# Patient Record
Sex: Female | Born: 1975 | Race: White | Hispanic: No | Marital: Married | Smoking: Former smoker
Health system: Southern US, Community
[De-identification: ages and names within clinical notes are randomized; demographics above are authoritative.]

## PROBLEM LIST (undated history)

## (undated) DIAGNOSIS — F419 Anxiety disorder, unspecified: Secondary | ICD-10-CM

## (undated) DIAGNOSIS — J45909 Unspecified asthma, uncomplicated: Secondary | ICD-10-CM

## (undated) DIAGNOSIS — M719 Bursopathy, unspecified: Secondary | ICD-10-CM

## (undated) DIAGNOSIS — G43909 Migraine, unspecified, not intractable, without status migrainosus: Secondary | ICD-10-CM

## (undated) DIAGNOSIS — M1990 Unspecified osteoarthritis, unspecified site: Secondary | ICD-10-CM

## (undated) DIAGNOSIS — F32A Depression, unspecified: Secondary | ICD-10-CM

## (undated) DIAGNOSIS — I499 Cardiac arrhythmia, unspecified: Secondary | ICD-10-CM

## (undated) DIAGNOSIS — D649 Anemia, unspecified: Secondary | ICD-10-CM

## (undated) DIAGNOSIS — A048 Other specified bacterial intestinal infections: Secondary | ICD-10-CM

## (undated) DIAGNOSIS — F329 Major depressive disorder, single episode, unspecified: Secondary | ICD-10-CM

## (undated) HISTORY — DX: Other specified bacterial intestinal infections: A04.8

## (undated) HISTORY — DX: Major depressive disorder, single episode, unspecified: F32.9

## (undated) HISTORY — DX: Anxiety disorder, unspecified: F41.9

## (undated) HISTORY — DX: Depression, unspecified: F32.A

## (undated) HISTORY — PX: AUGMENTATION MAMMAPLASTY: SUR837

## (undated) HISTORY — PX: OTHER SURGICAL HISTORY: SHX169

## (undated) HISTORY — DX: Bursopathy, unspecified: M71.9

## (undated) HISTORY — DX: Unspecified osteoarthritis, unspecified site: M19.90

## (undated) HISTORY — DX: Unspecified asthma, uncomplicated: J45.909

## (undated) HISTORY — DX: Migraine, unspecified, not intractable, without status migrainosus: G43.909

---

## 2006-03-29 HISTORY — PX: COSMETIC SURGERY: SHX468

## 2017-04-04 ENCOUNTER — Ambulatory Visit (INDEPENDENT_AMBULATORY_CARE_PROVIDER_SITE_OTHER): Admitting: Physician Assistant

## 2017-04-04 ENCOUNTER — Encounter: Payer: Self-pay | Admitting: Physician Assistant

## 2017-04-04 VITALS — BP 119/80 | HR 84 | Temp 98.3°F | Ht 65.0 in | Wt 151.6 lb

## 2017-04-04 DIAGNOSIS — M7071 Other bursitis of hip, right hip: Secondary | ICD-10-CM | POA: Diagnosis not present

## 2017-04-04 DIAGNOSIS — F339 Major depressive disorder, recurrent, unspecified: Secondary | ICD-10-CM

## 2017-04-04 DIAGNOSIS — G43809 Other migraine, not intractable, without status migrainosus: Secondary | ICD-10-CM

## 2017-04-04 DIAGNOSIS — Z0000 Encounter for general adult medical examination without abnormal findings: Secondary | ICD-10-CM

## 2017-04-04 DIAGNOSIS — F411 Generalized anxiety disorder: Secondary | ICD-10-CM | POA: Diagnosis not present

## 2017-04-04 DIAGNOSIS — N938 Other specified abnormal uterine and vaginal bleeding: Secondary | ICD-10-CM

## 2017-04-04 DIAGNOSIS — G43909 Migraine, unspecified, not intractable, without status migrainosus: Secondary | ICD-10-CM | POA: Insufficient documentation

## 2017-04-04 DIAGNOSIS — F418 Other specified anxiety disorders: Secondary | ICD-10-CM | POA: Insufficient documentation

## 2017-04-04 MED ORDER — BUPROPION HCL ER (XL) 150 MG PO TB24: 150.0000 mg | ORAL_TABLET | Freq: Every day | ORAL | 5 refills | Status: DC

## 2017-04-04 MED ORDER — HYDROCODONE-ACETAMINOPHEN 5-325 MG PO TABS: 1.0000 | ORAL_TABLET | Freq: Four times a day (QID) | ORAL | 0 refills | Status: DC | PRN

## 2017-04-04 MED ORDER — ESCITALOPRAM OXALATE 10 MG PO TABS: 10.0000 mg | ORAL_TABLET | Freq: Every day | ORAL | 5 refills | Status: DC

## 2017-04-04 MED ORDER — OXCARBAZEPINE 300 MG PO TABS: 300.0000 mg | ORAL_TABLET | Freq: Every day | ORAL | 11 refills | Status: DC

## 2017-04-04 MED ORDER — ESCITALOPRAM OXALATE 20 MG PO TABS: 10.0000 mg | ORAL_TABLET | Freq: Every day | ORAL | 5 refills | Status: DC

## 2017-04-04 MED ORDER — MELOXICAM 7.5 MG PO TABS: 7.5000 mg | ORAL_TABLET | Freq: Every day | ORAL | 11 refills | Status: AC

## 2017-04-04 MED ORDER — ALPRAZOLAM 1 MG PO TABS: 1.0000 mg | ORAL_TABLET | Freq: Three times a day (TID) | ORAL | 1 refills | Status: DC

## 2017-04-04 NOTE — Patient Instructions (Addendum)
Bursitis Bursitis is when the fluid-filled sac (bursa) that covers and protects a joint is swollen (inflamed). Bursitis is most common near joints, especially the knees, elbows, hips, and shoulders. Follow these instructions at home:  Take medicines only as told by your doctor.  If you were prescribed an antibiotic medicine, finish it all even if you start to feel better.  Rest the affected area as told by your doctor. ? Keep the area raised up. ? Avoid doing things that make the pain worse.  Apply ice to the injured area: ? Place ice in a plastic bag. ? Place a towel between your skin and the bag. ? Leave the ice on for 20 minutes, 2-3 times a day.  Use splints, braces, pads, or walking aids as told by your doctor.  Keep all follow-up visits as told by your doctor. This is important. Contact a doctor if:  You have more pain with home care.  You have a fever.  You have chills. This information is not intended to replace advice given to you by your health care provider. Make sure you discuss any questions you have with your health care provider. Document Released: 09/02/2009 Document Revised: 08/21/2015 Document Reviewed: 06/04/2013 Elsevier Interactive Patient Education  2018 [employer].   Your provider wants you to schedule an appointment with a Psychologist/Psychiatrist. The following list of offices requires the patient to call and make their own appointment, as there is information they need that only you can provide. Please feel free to choose form the following providers:  [facility]   [phone] Crisis Recovery in [place] [phone]  [facility]  [phone] [place], [place]  (Scheduled through [facility]) Must call and do an interview for appointment. Sees Children / Accepts Medicaid  [facility]    [phone]  [address]    [place], [place]       [facility]  [phone] [address] [place], [place]  Evaluates for Autism but does not treat it Sees Children / Accepts Medicaid  [facility]    [phone] [address]   [place], [place] Medication management, substance abuse, bipolar, grief, family, marriage, OCD, anxiety, PTSD Sees children / Accepts Medicaid  [facility]    [phone] [address] [place], [place] Sees children / Accepts [facility]  [facility]  [phone] [address] [place], [place]   Dr Estelle Grumbles     [phone] [address] [place], [place]  Sees ADD & ADHD for treatment Accepts Medicaid  [facility]  [phone] [address], [place] Evaluates for Autism Accepts [facility]  [facility]  [phone] [address] [place], [place]  Does Adult ADD evaluations Does not accept Medicaid  [facility]   [phone] [address] [place], [place] Uses animal therapy  Sees children as young as 64 years old Accepts [facility]     [phone]    [address]  [place], [place] [address] Sees children Accepts Medicaid

## 2017-04-04 NOTE — Progress Notes (Signed)
BP 119/80   Pulse 84   Temp 98.3 F (36.8 C) (Oral)   Ht 5' 5"  (1.651 m)   Wt 151 lb 9.6 oz (68.8 kg)   LMP 04/04/2017   BMI 25.23 kg/m    Subjective:    Patient ID: Kimberly Terrell, female    DOB: 06-02-1975, 42 y.o.   MRN: [id number]  HPI: Kimberly Terrell is a 42 y.o. female presenting on 04/04/2017 for New Patient (Initial Visit); Establish Care; and Sore Throat Patient comes in as a new patient today.  She has moved to the area from [place]. She has currently migraines, bursitis of her right hip, generalized anxiety, dysfunctional uterine bleeding, depression.  All of her medications are reviewed today.  She is due her annual labs today.  Will refill meds as needed and we will plan for gynecology evaluation.  Relevant past medical, surgical, family and social history reviewed and updated as indicated. Allergies and medications reviewed and updated.  Past Medical History:  Diagnosis Date  . Anxiety   . Arthritis   . Asthma   . Depression   . Migraines     Past Surgical History:  Procedure Laterality Date  . COSMETIC SURGERY  2008   breast implants     Review of Systems  Constitutional: Negative.  Negative for activity change, fatigue and fever.  HENT: Negative.   Eyes: Negative.   Respiratory: Negative.  Negative for cough.   Cardiovascular: Negative.  Negative for chest pain.  Gastrointestinal: Positive for abdominal pain.  Endocrine: Negative.   Genitourinary: Positive for menstrual problem, pelvic pain and vaginal pain. Negative for dysuria, frequency and hematuria.  Musculoskeletal: Positive for arthralgias and gait problem.  Skin: Negative.   Psychiatric/Behavioral: Positive for dysphoric mood, self-injury and sleep disturbance. Negative for suicidal ideas.    Allergies as of 04/04/2017   No Known Allergies     Medication List        Accurate as of 04/04/17  9:56 PM. Always use your most recent med list.          acetaminophen 500 MG  tablet Commonly known as:  TYLENOL Take 500 mg by mouth every 6 (six) hours as needed.   ALPRAZolam 1 MG tablet Commonly known as:  XANAX Take 1 tablet (1 mg total) by mouth 3 (three) times daily.   buPROPion 150 MG 24 hr tablet Commonly known as:  WELLBUTRIN XL Take 1 tablet (150 mg total) by mouth daily.   diphenhydrAMINE 50 MG tablet Commonly known as:  BENADRYL Take 50 mg by mouth at bedtime as needed for itching.   escitalopram 20 MG tablet Commonly known as:  LEXAPRO Take 0.5 tablets (10 mg total) by mouth daily.   escitalopram 10 MG tablet Commonly known as:  LEXAPRO Take 1 tablet (10 mg total) by mouth daily.   GLUCOSAMINE CHONDR 1500 COMPLX PO Take by mouth.   HYDROcodone-acetaminophen 5-325 MG tablet Commonly known as:  NORCO/VICODIN Take 1 tablet by mouth every 6 (six) hours as needed for moderate pain.   levonorgestrel 20 MCG/24HR IUD Commonly known as:  MIRENA 1 each by Intrauterine route once.   Melatonin 10 MG Caps Take 20 mg by mouth at bedtime.   meloxicam 7.5 MG tablet Commonly known as:  MOBIC Take 1 tablet (7.5 mg total) by mouth daily.   Oxcarbazepine 300 MG tablet Commonly known as:  TRILEPTAL Take 1 tablet (300 mg total) by mouth at bedtime.   pseudoephedrine 30 MG tablet Commonly  known as:  SUDAFED Take 30 mg by mouth every 4 (four) hours as needed for congestion.          Objective:    BP 119/80   Pulse 84   Temp 98.3 F (36.8 C) (Oral)   Ht 5' 5"  (1.651 m)   Wt 151 lb 9.6 oz (68.8 kg)   LMP 04/04/2017   BMI 25.23 kg/m   No Known Allergies  Physical Exam  Constitutional: She is oriented to person, place, and time. She appears well-developed and well-nourished.  HENT:  Head: Normocephalic and atraumatic.  Right Ear: Tympanic membrane, external ear and ear canal normal.  Left Ear: Tympanic membrane, external ear and ear canal normal.  Nose: Nose normal. No rhinorrhea.  Mouth/Throat: Oropharynx is clear and moist and  mucous membranes are normal. No oropharyngeal exudate or posterior oropharyngeal erythema.  Eyes: Conjunctivae and EOM are normal. Pupils are equal, round, and reactive to light.  Neck: Normal range of motion. Neck supple.  Cardiovascular: Normal rate, regular rhythm, normal heart sounds and intact distal pulses.  Pulmonary/Chest: Effort normal and breath sounds normal.  Abdominal: Soft. Bowel sounds are normal.  Musculoskeletal:       Left hip: She exhibits decreased range of motion and tenderness.       Legs: Neurological: She is alert and oriented to person, place, and time. She has normal reflexes.  Skin: Skin is warm and dry. No rash noted.  Psychiatric: She has a normal mood and affect. Her behavior is normal. Judgment and thought content normal.    No results found for this or any previous visit.    Assessment & Plan:   1. Depression, recurrent ([place]) - Oxcarbazepine (TRILEPTAL) 300 MG tablet; Take 1 tablet (300 mg total) by mouth at bedtime.  Dispense: 30 tablet; Refill: 11 - escitalopram (LEXAPRO) 20 MG tablet; Take 0.5 tablets (10 mg total) by mouth daily.  Dispense: 30 tablet; Refill: 5 - escitalopram (LEXAPRO) 10 MG tablet; Take 1 tablet (10 mg total) by mouth daily.  Dispense: 30 tablet; Refill: 5 - buPROPion (WELLBUTRIN XL) 150 MG 24 hr tablet; Take 1 tablet (150 mg total) by mouth daily.  Dispense: 30 tablet; Refill: 5  2. Other migraine without status migrainosus, not intractable  3. Bursitis of right hip, unspecified bursa - meloxicam (MOBIC) 7.5 MG tablet; Take 1 tablet (7.5 mg total) by mouth daily.  Dispense: 30 tablet; Refill: 11 - HYDROcodone-acetaminophen (NORCO/VICODIN) 5-325 MG tablet; Take 1 tablet by mouth every 6 (six) hours as needed for moderate pain.  Dispense: 30 tablet; Refill: 0  4. GAD (generalized anxiety disorder) - ALPRAZolam (XANAX) 1 MG tablet; Take 1 tablet (1 mg total) by mouth 3 (three) times daily.  Dispense: 90 tablet; Refill: 1  5. DUB  (dysfunctional uterine bleeding) - levonorgestrel (MIRENA) 20 MCG/24HR IUD; 1 each by Intrauterine route once. - Ambulatory referral to Obstetrics / Gynecology  6. Well adult exam - CBC with Differential/Platelet - CMP14+EGFR - Lipid panel - TSH  - TSH    Current Outpatient Medications:  .  acetaminophen (TYLENOL) 500 MG tablet, Take 500 mg by mouth every 6 (six) hours as needed., Disp: , Rfl:  .  ALPRAZolam (XANAX) 1 MG tablet, Take 1 tablet (1 mg total) by mouth 3 (three) times daily., Disp: 90 tablet, Rfl: 1 .  buPROPion (WELLBUTRIN XL) 150 MG 24 hr tablet, Take 1 tablet (150 mg total) by mouth daily., Disp: 30 tablet, Rfl: 5 .  diphenhydrAMINE (BENADRYL) 50 MG  tablet, Take 50 mg by mouth at bedtime as needed for itching., Disp: , Rfl:  .  escitalopram (LEXAPRO) 10 MG tablet, Take 1 tablet (10 mg total) by mouth daily., Disp: 30 tablet, Rfl: 5 .  escitalopram (LEXAPRO) 20 MG tablet, Take 0.5 tablets (10 mg total) by mouth daily., Disp: 30 tablet, Rfl: 5 .  Glucosamine-Chondroit-Vit C-Mn (GLUCOSAMINE CHONDR 1500 COMPLX PO), Take by mouth., Disp: , Rfl:  .  levonorgestrel (MIRENA) 20 MCG/24HR IUD, 1 each by Intrauterine route once., Disp: , Rfl:  .  Melatonin 10 MG CAPS, Take 20 mg by mouth at bedtime., Disp: , Rfl:  .  meloxicam (MOBIC) 7.5 MG tablet, Take 1 tablet (7.5 mg total) by mouth daily., Disp: 30 tablet, Rfl: 11 .  Oxcarbazepine (TRILEPTAL) 300 MG tablet, Take 1 tablet (300 mg total) by mouth at bedtime., Disp: 30 tablet, Rfl: 11 .  pseudoephedrine (SUDAFED) 30 MG tablet, Take 30 mg by mouth every 4 (four) hours as needed for congestion., Disp: , Rfl:  .  HYDROcodone-acetaminophen (NORCO/VICODIN) 5-325 MG tablet, Take 1 tablet by mouth every 6 (six) hours as needed for moderate pain., Disp: 30 tablet, Rfl: 0 Continue all other maintenance medications as listed above.  Follow up plan: Return in about 3 months (around 07/03/2017) for recheck.  Educational handout given for  [place] PA-C [place] [address]  [place], [place] [address] [phone]   04/04/2017, 9:56 PM

## 2017-04-05 LAB — CBC WITH DIFFERENTIAL/PLATELET
BASOS ABS: 0.1 10*3/uL (ref 0.0–0.2)
Basos: 0 %
EOS (ABSOLUTE): 0.4 10*3/uL (ref 0.0–0.4)
Eos: 2 %
HEMOGLOBIN: 14.9 g/dL (ref 11.1–15.9)
Hematocrit: 44.9 % (ref 34.0–46.6)
Immature Grans (Abs): 0 10*3/uL (ref 0.0–0.1)
Immature Granulocytes: 0 %
LYMPHS ABS: 1.7 10*3/uL (ref 0.7–3.1)
Lymphs: 8 %
MCH: 31.1 pg (ref 26.6–33.0)
MCHC: 33.2 g/dL (ref 31.5–35.7)
MCV: 94 fL (ref 79–97)
MONOCYTES: 4 %
MONOS ABS: 0.9 10*3/uL (ref 0.1–0.9)
Neutrophils Absolute: 16.7 10*3/uL — ABNORMAL HIGH (ref 1.4–7.0)
Neutrophils: 86 %
Platelets: 249 10*3/uL (ref 150–379)
RBC: 4.79 x10E6/uL (ref 3.77–5.28)
RDW: 13.4 % (ref 12.3–15.4)
WBC: 19.6 10*3/uL — ABNORMAL HIGH (ref 3.4–10.8)

## 2017-04-05 LAB — CMP14+EGFR
ALBUMIN: 4.8 g/dL (ref 3.5–5.5)
ALK PHOS: 64 IU/L (ref 39–117)
ALT: 22 IU/L (ref 0–32)
AST: 16 IU/L (ref 0–40)
Albumin/Globulin Ratio: 2 (ref 1.2–2.2)
BILIRUBIN TOTAL: 0.4 mg/dL (ref 0.0–1.2)
BUN / CREAT RATIO: 12 (ref 9–23)
BUN: 9 mg/dL (ref 6–24)
CHLORIDE: 103 mmol/L (ref 96–106)
CO2: 21 mmol/L (ref 20–29)
Calcium: 9.9 mg/dL (ref 8.7–10.2)
Creatinine, Ser: 0.73 mg/dL (ref 0.57–1.00)
GFR calc Af Amer: 119 mL/min/{1.73_m2} (ref >59)
GFR calc non Af Amer: 103 mL/min/{1.73_m2} (ref >59)
GLUCOSE: 82 mg/dL (ref 65–99)
Globulin, Total: 2.4 g/dL (ref 1.5–4.5)
Potassium: 4.5 mmol/L (ref 3.5–5.2)
SODIUM: 140 mmol/L (ref 134–144)
Total Protein: 7.2 g/dL (ref 6.0–8.5)

## 2017-04-05 LAB — LIPID PANEL
CHOLESTEROL TOTAL: 166 mg/dL (ref 100–199)
Chol/HDL Ratio: 3.9 ratio (ref 0.0–4.4)
HDL: 43 mg/dL (ref >39)
LDL Calculated: 103 mg/dL — ABNORMAL HIGH (ref 0–99)
Triglycerides: 100 mg/dL (ref 0–149)
VLDL CHOLESTEROL CAL: 20 mg/dL (ref 5–40)

## 2017-04-05 LAB — TSH: TSH: 1.02 u[IU]/mL (ref 0.450–4.500)

## 2017-04-06 ENCOUNTER — Other Ambulatory Visit: Payer: Self-pay | Admitting: *Deleted

## 2017-04-06 DIAGNOSIS — D72829 Elevated white blood cell count, unspecified: Secondary | ICD-10-CM

## 2017-04-18 ENCOUNTER — Other Ambulatory Visit (HOSPITAL_COMMUNITY)
Admission: RE | Admit: 2017-04-18 | Discharge: 2017-04-18 | Disposition: A | Discharge status: Home / Self Care | Payer: TRICARE For Life (TFL) | Source: Ambulatory Visit | Attending: Obstetrics & Gynecology | Admitting: Obstetrics & Gynecology

## 2017-04-18 ENCOUNTER — Ambulatory Visit (INDEPENDENT_AMBULATORY_CARE_PROVIDER_SITE_OTHER): Admitting: Obstetrics & Gynecology

## 2017-04-18 ENCOUNTER — Encounter: Payer: Self-pay | Admitting: Obstetrics & Gynecology

## 2017-04-18 ENCOUNTER — Other Ambulatory Visit: Payer: Self-pay

## 2017-04-18 VITALS — BP 128/80 | HR 90 | Ht 65.0 in | Wt 151.0 lb

## 2017-04-18 DIAGNOSIS — Z01419 Encounter for gynecological examination (general) (routine) without abnormal findings: Secondary | ICD-10-CM | POA: Diagnosis present

## 2017-04-18 DIAGNOSIS — N921 Excessive and frequent menstruation with irregular cycle: Secondary | ICD-10-CM

## 2017-04-18 DIAGNOSIS — Z01411 Encounter for gynecological examination (general) (routine) with abnormal findings: Secondary | ICD-10-CM | POA: Diagnosis not present

## 2017-04-18 DIAGNOSIS — N946 Dysmenorrhea, unspecified: Secondary | ICD-10-CM

## 2017-04-18 MED ORDER — MEGESTROL ACETATE 40 MG PO TABS
ORAL_TABLET | ORAL | 3 refills | Status: DC
Start: 2017-04-18 — End: 2017-07-25

## 2017-04-18 NOTE — Progress Notes (Signed)
Subjective:     Kimberly Terrell is a 42 y.o. female here for a routine exam.  Patient's last menstrual period was 04/04/2017. [id number] Birth Control Method:  IUD/vasectomy Menstrual Calendar(currently): a bit irregular, 5-7 days of bleeding heavy with severe cramping  Current complaints: dysmenorrhea menorrhagia, denies dyspareunia or pain other than with her cycle.   Current acute medical issues:  none   Recent Gynecologic History Patient's last menstrual period was 04/04/2017. Last Pap: 2016,  normal Last mammogram: never,    Past Medical History:  Diagnosis Date  . Anxiety   . Arthritis   . Asthma   . Depression   . Migraines     Past Surgical History:  Procedure Laterality Date  . COSMETIC SURGERY  2008   breast implants     OB History    Gravida Para Term Preterm AB Living   2 1     1 1    SAB TAB Ectopic Multiple Live Births   1              Social History   Socioeconomic History  . Marital status: Married    Spouse name: None  . Number of children: None  . Years of education: None  . Highest education level: None  Social Needs  . Financial resource strain: None  . Food insecurity - worry: None  . Food insecurity - inability: None  . Transportation needs - medical: None  . Transportation needs - non-medical: None  Occupational History  . None  Tobacco Use  . Smoking status: Current Every Day Smoker    Packs/day: 1.00    Types: Cigarettes  . Smokeless tobacco: Never Used  Substance and Sexual Activity  . Alcohol use: No    Frequency: Never  . Drug use: No  . Sexual activity: Yes    Birth control/protection: IUD  Other Topics Concern  . None  Social History Narrative  . None    Family History  Problem Relation Age of Onset  . Depression Mother   . Depression Daughter      Current Outpatient Medications:  .  acetaminophen (TYLENOL) 500 MG tablet, Take 500 mg by mouth every 6 (six) hours as needed., Disp: , Rfl:  .  ALPRAZolam (XANAX) 1  MG tablet, Take 1 tablet (1 mg total) by mouth 3 (three) times daily., Disp: 90 tablet, Rfl: 1 .  buPROPion (WELLBUTRIN XL) 150 MG 24 hr tablet, Take 1 tablet (150 mg total) by mouth daily., Disp: 30 tablet, Rfl: 5 .  diphenhydrAMINE (BENADRYL) 50 MG tablet, Take 50 mg by mouth at bedtime as needed for itching., Disp: , Rfl:  .  escitalopram (LEXAPRO) 10 MG tablet, Take 1 tablet (10 mg total) by mouth daily., Disp: 30 tablet, Rfl: 5 .  escitalopram (LEXAPRO) 20 MG tablet, Take 0.5 tablets (10 mg total) by mouth daily., Disp: 30 tablet, Rfl: 5 .  Glucosamine-Chondroit-Vit C-Mn (GLUCOSAMINE CHONDR 1500 COMPLX PO), Take by mouth., Disp: , Rfl:  .  HYDROcodone-acetaminophen (NORCO/VICODIN) 5-325 MG tablet, Take 1 tablet by mouth every 6 (six) hours as needed for moderate pain., Disp: 30 tablet, Rfl: 0 .  levonorgestrel (MIRENA) 20 MCG/24HR IUD, 1 each by Intrauterine route once., Disp: , Rfl:  .  Melatonin 10 MG CAPS, Take 20 mg by mouth at bedtime., Disp: , Rfl:  .  meloxicam (MOBIC) 7.5 MG tablet, Take 1 tablet (7.5 mg total) by mouth daily., Disp: 30 tablet, Rfl: 11 .  Oxcarbazepine (TRILEPTAL) 300 MG  tablet, Take 1 tablet (300 mg total) by mouth at bedtime., Disp: 30 tablet, Rfl: 11 .  pseudoephedrine (SUDAFED) 30 MG tablet, Take 30 mg by mouth every 4 (four) hours as needed for congestion., Disp: , Rfl:  .  megestrol (MEGACE) 40 MG tablet, 1 tablet daily, Disp: 30 tablet, Rfl: 3  Review of Systems  Review of Systems  Constitutional: Negative for fever, chills, weight loss, malaise/fatigue and diaphoresis.  HENT: Negative for hearing loss, ear pain, nosebleeds, congestion, sore throat, neck pain, tinnitus and ear discharge.   Eyes: Negative for blurred vision, double vision, photophobia, pain, discharge and redness.  Respiratory: Negative for cough, hemoptysis, sputum production, shortness of breath, wheezing and stridor.   Cardiovascular: Negative for chest pain, palpitations, orthopnea,  claudication, leg swelling and PND.  Gastrointestinal: negative for abdominal pain. Negative for heartburn, nausea, vomiting, diarrhea, constipation, blood in stool and melena.  Genitourinary: Negative for dysuria, urgency, frequency, hematuria and flank pain.  Musculoskeletal: Negative for myalgias, back pain, joint pain and falls.  Skin: Negative for itching and rash.  Neurological: Negative for dizziness, tingling, tremors, sensory change, speech change, focal weakness, seizures, loss of consciousness, weakness and headaches.  Endo/Heme/Allergies: Negative for environmental allergies and polydipsia. Does not bruise/bleed easily.  Psychiatric/Behavioral: Negative for depression, suicidal ideas, hallucinations, memory loss and substance abuse. The patient is not nervous/anxious and does not have insomnia.        Objective:  Blood pressure 128/80, pulse 90, height 5\' 5"  (1.651 m), weight 151 lb (68.5 kg), last menstrual period 04/04/2017.   Physical Exam  Vitals reviewed. Constitutional: She is oriented to person, place, and time. She appears well-developed and well-nourished.  HENT:  Head: Normocephalic and atraumatic.        Right Ear: External ear normal.  Left Ear: External ear normal.  Nose: Nose normal.  Mouth/Throat: Oropharynx is clear and moist.  Eyes: Conjunctivae and EOM are normal. Pupils are equal, round, and reactive to light. Right eye exhibits no discharge. Left eye exhibits no discharge. No scleral icterus.  Neck: Normal range of motion. Neck supple. No tracheal deviation present. No thyromegaly present.  Cardiovascular: Normal rate, regular rhythm, normal heart sounds and intact distal pulses.  Exam reveals no gallop and no friction rub.   No murmur heard. Respiratory: Effort normal and breath sounds normal. No respiratory distress. She has no wheezes. She has no rales. She exhibits no tenderness.  GI: Soft. Bowel sounds are normal. She exhibits no distension and no mass.  There is no tenderness. There is no rebound and no guarding.  Genitourinary:  Breasts s/p breast implants no masses skin changes or nipple changes bilaterally      Vulva is normal without lesions Vagina is pink moist without discharge Cervix normal in appearance and pap is done Uterus is normal size shape and contour Adnexa is negative with normal sized ovaries   Musculoskeletal: Normal range of motion. She exhibits no edema and no tenderness.  Neurological: She is alert and oriented to person, place, and time. She has normal reflexes. She displays normal reflexes. No cranial nerve deficit. She exhibits normal muscle tone. Coordination normal.  Skin: Skin is warm and dry. No rash noted. No erythema. No pallor.  Psychiatric: She has a normal mood and affect. Her behavior is normal. Judgment and thought content normal.       Medications Ordered at today's visit: Meds ordered this encounter  Medications  . megestrol (MEGACE) 40 MG tablet    Sig: 1 tablet  daily    Dispense:  30 tablet    Refill:  3    Other orders placed at today's visit: Orders Placed This Encounter  Procedures  . US PELVIS (TRANSABDOMINAL ONLY)  . US PELVIS TRANSVANGINAL NON-OB (TV ONLY)      Assessment:    Healthy female exam.    Plan:    begin megestrol 40 mg daily for menstrual control Sonogram 1 month for endometrial evaluation, if normal consider ablation     Return in about 1 month (around 05/19/2017) for GYN sono, Follow up, with Dr Despina Hidden.

## 2017-04-18 NOTE — Addendum Note (Signed)
Addended by: Tish Frederickson A on: 04/18/2017 11:17 AM   Modules accepted: Orders

## 2017-04-19 LAB — CYTOLOGY - PAP
DIAGNOSIS: NEGATIVE
HPV (WINDOPATH): NOT DETECTED

## 2017-04-20 ENCOUNTER — Other Ambulatory Visit: Payer: Self-pay | Admitting: Physician Assistant

## 2017-04-20 DIAGNOSIS — F339 Major depressive disorder, recurrent, unspecified: Secondary | ICD-10-CM

## 2017-04-20 MED ORDER — ESCITALOPRAM OXALATE 20 MG PO TABS: 30.0000 mg | ORAL_TABLET | Freq: Every day | ORAL | 5 refills | Status: DC

## 2017-05-19 ENCOUNTER — Encounter: Payer: Self-pay | Admitting: Obstetrics & Gynecology

## 2017-05-19 ENCOUNTER — Ambulatory Visit (INDEPENDENT_AMBULATORY_CARE_PROVIDER_SITE_OTHER): Admitting: Obstetrics & Gynecology

## 2017-05-19 ENCOUNTER — Other Ambulatory Visit: Payer: Self-pay

## 2017-05-19 ENCOUNTER — Ambulatory Visit (INDEPENDENT_AMBULATORY_CARE_PROVIDER_SITE_OTHER)

## 2017-05-19 ENCOUNTER — Encounter (INDEPENDENT_AMBULATORY_CARE_PROVIDER_SITE_OTHER): Payer: Self-pay

## 2017-05-19 VITALS — BP 120/84 | HR 88 | Ht 65.0 in | Wt 150.0 lb

## 2017-05-19 DIAGNOSIS — N946 Dysmenorrhea, unspecified: Secondary | ICD-10-CM

## 2017-05-19 DIAGNOSIS — N921 Excessive and frequent menstruation with irregular cycle: Secondary | ICD-10-CM | POA: Diagnosis not present

## 2017-05-19 NOTE — Progress Notes (Signed)
Follow up appointment for results  Chief Complaint  Patient presents with  . Follow-up    u/s    Blood pressure 120/84, pulse 88, height 5\' 5"  (1.651 m), weight 150 lb (68 kg).  GYNECOLOGIC SONOGRAM   Kimberly Terrell is a 42 y.o. G2P0011 unknown LMP,she is here for a pelvic sonogram for dysmenorrhea with IUD in place.  Uterus                      9.2 x 4 x 4.9 cm, Vol 99 ml, homogeneous anteverted uterus,normal  Endometrium          2.6 mm, symmetrical,normal,IUD is centrally located w/in the endometrium  Right ovary             2.4 x 1.8 x 2.3 cm, normal  Left ovary                3.1 x 1.4 x 3 cm, normal  No free fluid,multiple small simple nabothian cysts  Technician Comments:  PELVIC US TA/TV: homogeneous anteverted uterus,normal,IUD is centrally located w/in the endometrium,normal ovaries bilat,ovaries appear mobile,no free fluid,no pain during ultrasound     [employer] 05/19/2017 9:47 AM  Clinical Impression and recommendations:  I have reviewed the sonogram results above, combined with the patient's current clinical course, below are my impressions and any appropriate recommendations for management based on the sonographic findings.  Uterus and endometrium are normal IUD is properly located Both ovaries are normal   Lazaro Arms     MEDS ordered this encounter: No orders of the defined types were placed in this encounter.   Orders for this encounter: No orders of the defined types were placed in this encounter.   Impression: Menorrhagia with irregular cycle  Dysmenorrhea    Plan: Pt to consider options including conservative ablation For now will continue megestrol  Follow Up: No follow-ups on file.       Face to face time:  10 minutes  Greater than 50% of the visit time was spent in counseling and coordination of care with the patient.  The summary and outline of the counseling and care coordination is  summarized in the note above.   All questions were answered.  Past Medical History:  Diagnosis Date  . Anxiety   . Arthritis   . Asthma   . Depression   . Migraines     Past Surgical History:  Procedure Laterality Date  . COSMETIC SURGERY  2008   breast implants     OB History    Gravida  2   Para  1   Term      Preterm      AB  1   Living  1     SAB  1   TAB      Ectopic      Multiple      Live Births              No Known Allergies  Social History   Socioeconomic History  . Marital status: Married    Spouse name: Marlin Nakao  . Number of children: 1  . Years of education: Not on file  . Highest education level: Not on file  Occupational History  . Occupation: [employer]  Social Needs  . Financial resource strain: Not on file  . Food insecurity:    Worry: Not on file    Inability: Not on file  . Transportation  needs:    Medical: Not on file    Non-medical: Not on file  Tobacco Use  . Smoking status: Current Every Day Smoker    Packs/day: 1.00    Types: Cigarettes  . Smokeless tobacco: Never Used  Substance and Sexual Activity  . Alcohol use: No    Frequency: Never  . Drug use: No  . Sexual activity: Yes    Birth control/protection: IUD  Lifestyle  . Physical activity:    Days per week: Not on file    Minutes per session: Not on file  . Stress: Not on file  Relationships  . Social connections:    Talks on phone: Not on file    Gets together: Not on file    Attends religious service: Not on file    Active member of club or organization: Not on file    Attends meetings of clubs or organizations: Not on file    Relationship status: Not on file  Other Topics Concern  . Not on file  Social History Narrative   Right handed    Lives with [place]   Caffeine use: coffee-daily    Family History  Problem Relation Age of Onset  . Depression Mother   . Depression Daughter

## 2017-05-19 NOTE — Progress Notes (Signed)
PELVIC US TA/TV: homogeneous anteverted uterus,WNL,IUD is centrally located w/in the endometrium,normal ovaries bilat,ovaries appear mobile,no free fluid,no pain during ultrasound

## 2017-07-05 ENCOUNTER — Ambulatory Visit: Admitting: Physician Assistant

## 2017-07-19 ENCOUNTER — Other Ambulatory Visit: Payer: Self-pay | Admitting: Physician Assistant

## 2017-07-19 DIAGNOSIS — F411 Generalized anxiety disorder: Secondary | ICD-10-CM

## 2017-07-25 ENCOUNTER — Encounter: Payer: Self-pay | Admitting: Physician Assistant

## 2017-07-25 ENCOUNTER — Ambulatory Visit (INDEPENDENT_AMBULATORY_CARE_PROVIDER_SITE_OTHER): Admitting: Physician Assistant

## 2017-07-25 VITALS — BP 111/71 | HR 81 | Temp 97.7°F | Ht 65.0 in | Wt 156.4 lb

## 2017-07-25 DIAGNOSIS — F411 Generalized anxiety disorder: Secondary | ICD-10-CM

## 2017-07-25 DIAGNOSIS — J309 Allergic rhinitis, unspecified: Secondary | ICD-10-CM | POA: Diagnosis not present

## 2017-07-25 DIAGNOSIS — N938 Other specified abnormal uterine and vaginal bleeding: Secondary | ICD-10-CM | POA: Diagnosis not present

## 2017-07-25 DIAGNOSIS — M7071 Other bursitis of hip, right hip: Secondary | ICD-10-CM

## 2017-07-25 MED ORDER — MONTELUKAST SODIUM 10 MG PO TABS: 10.0000 mg | ORAL_TABLET | Freq: Every day | ORAL | 11 refills | Status: DC

## 2017-07-25 MED ORDER — MEGESTROL ACETATE 40 MG PO TABS
ORAL_TABLET | ORAL | 5 refills | Status: DC
Start: 2017-07-25 — End: 2017-11-22

## 2017-07-25 MED ORDER — ALPRAZOLAM 1 MG PO TABS: 1.0000 mg | ORAL_TABLET | Freq: Three times a day (TID) | ORAL | 1 refills | Status: DC

## 2017-07-25 MED ORDER — HYDROCODONE-ACETAMINOPHEN 5-325 MG PO TABS: 1.0000 | ORAL_TABLET | ORAL | 0 refills | Status: DC | PRN

## 2017-07-25 MED ORDER — MOMETASONE FUROATE 50 MCG/ACT NA SUSP: 2.0000 | Freq: Every day | NASAL | 12 refills | Status: DC

## 2017-07-25 NOTE — Progress Notes (Signed)
BP 111/71   Pulse 81   Temp 97.7 F (36.5 C) (Oral)   Ht 5\' 5"  (1.651 m)   Wt 156 lb 6.4 oz (70.9 kg)   BMI 26.03 kg/m    Subjective:    Patient ID: Kimberly Terrell, female    DOB: Aug 18, 1975, 42 y.o.   MRN: [id number]  HPI: Kimberly Terrell is a 42 y.o. female presenting on 07/25/2017 for Allergies and Bursitis  This patient comes in with several complaints.  Her allergies are very bad.  She has been taking Zyrtec for 2 days.  She also uses nasal decongestant spray on a very regular basis.  We have had a long discussion about stopping this medication because it is actually causing her to have rebound congestion.  She states that she understands.  She also needs a refill on her Megace.  Her gynecologist put her on this to help control with menstrual cycles.  It is done very well.  They are trying to avoid having to have an ablation or hysterectomy.  And finally her bursitis still acts up at times and she needs refills on her medicines.  Past Medical History:  Diagnosis Date  . Anxiety   . Arthritis   . Asthma   . Depression   . Migraines    Relevant past medical, surgical, family and social history reviewed and updated as indicated. Interim medical history since our last visit reviewed. Allergies and medications reviewed and updated. DATA REVIEWED: CHART IN EPIC  Family History reviewed for pertinent findings.  Review of Systems  Constitutional: Positive for fatigue. Negative for activity change, appetite change and chills.  HENT: Positive for congestion, postnasal drip, sneezing and sore throat.   Eyes: Negative.   Respiratory: Positive for cough. Negative for wheezing.   Cardiovascular: Negative.  Negative for chest pain, palpitations and leg swelling.  Gastrointestinal: Negative.   Genitourinary: Negative.   Musculoskeletal: Positive for arthralgias.  Skin: Negative.   Neurological: Negative for headaches.    Allergies as of 07/25/2017   No Known Allergies     Medication List        Accurate as of 07/25/17  1:21 PM. Always use your most recent med list.          acetaminophen 500 MG tablet Commonly known as:  TYLENOL Take 500 mg by mouth every 6 (six) hours as needed.   ALPRAZolam 1 MG tablet Commonly known as:  XANAX Take 1 tablet (1 mg total) by mouth 3 (three) times daily.   buPROPion 150 MG 24 hr tablet Commonly known as:  WELLBUTRIN XL Take 1 tablet (150 mg total) by mouth daily.   cetirizine 10 MG tablet Commonly known as:  ZYRTEC Take 10 mg by mouth daily.   diphenhydrAMINE 50 MG tablet Commonly known as:  BENADRYL Take 50 mg by mouth at bedtime as needed for itching.   escitalopram 20 MG tablet Commonly known as:  LEXAPRO Take 1.5 tablets (30 mg total) by mouth daily.   GLUCOSAMINE CHONDR 1500 COMPLX PO Take by mouth.   HYDROcodone-acetaminophen 5-325 MG tablet Commonly known as:  NORCO/VICODIN Take 1 tablet by mouth every 4 (four) hours as needed for moderate pain.   levonorgestrel 20 MCG/24HR IUD Commonly known as:  MIRENA 1 each by Intrauterine route once.   megestrol 40 MG tablet Commonly known as:  MEGACE 1 tablet daily   Melatonin 10 MG Caps Take 20 mg by mouth at bedtime.   meloxicam 7.5 MG  tablet Commonly known as:  MOBIC Take 1 tablet (7.5 mg total) by mouth daily.   mometasone 50 MCG/ACT nasal spray Commonly known as:  NASONEX Place 2 sprays into the nose daily.   montelukast 10 MG tablet Commonly known as:  SINGULAIR Take 1 tablet (10 mg total) by mouth at bedtime.   Oxcarbazepine 300 MG tablet Commonly known as:  TRILEPTAL Take 1 tablet (300 mg total) by mouth at bedtime.   pseudoephedrine 30 MG tablet Commonly known as:  SUDAFED Take 30 mg by mouth every 4 (four) hours as needed for congestion.          Objective:    BP 111/71   Pulse 81   Temp 97.7 F (36.5 C) (Oral)   Ht 5\' 5"  (1.651 m)   Wt 156 lb 6.4 oz (70.9 kg)   BMI 26.03 kg/m   No Known Allergies  Wt  Readings from Last 3 Encounters:  07/25/17 156 lb 6.4 oz (70.9 kg)  05/19/17 150 lb (68 kg)  04/18/17 151 lb (68.5 kg)    Physical Exam  Constitutional: She is oriented to person, place, and time. She appears well-developed and well-nourished.  HENT:  Head: Normocephalic and atraumatic.  Eyes: Pupils are equal, round, and reactive to light. Conjunctivae and EOM are normal.  Cardiovascular: Normal rate, regular rhythm, normal heart sounds and intact distal pulses.  Pulmonary/Chest: Effort normal and breath sounds normal.  Abdominal: Soft. Bowel sounds are normal.  Neurological: She is alert and oriented to person, place, and time. She has normal reflexes.  Skin: Skin is warm and dry. No rash noted.  Psychiatric: She has a normal mood and affect. Her behavior is normal. Judgment and thought content normal.        Assessment & Plan:   . 1. Bursitis of right hip, unspecified bursa - HYDROcodone-acetaminophen (NORCO/VICODIN) 5-325 MG tablet; Take 1 tablet by mouth every 4 (four) hours as needed for moderate pain.  Dispense: 40 tablet; Refill: 0  2. Allergic rhinitis, unspecified seasonality, unspecified trigger - cetirizine (ZYRTEC) 10 MG tablet; Take 10 mg by mouth daily. - montelukast (SINGULAIR) 10 MG tablet; Take 1 tablet (10 mg total) by mouth at bedtime.  Dispense: 30 tablet; Refill: 11 - mometasone (NASONEX) 50 MCG/ACT nasal spray; Place 2 sprays into the nose daily.  Dispense: 17 g; Refill: 12  3. GAD (generalized anxiety disorder) - ALPRAZolam (XANAX) 1 MG tablet; Take 1 tablet (1 mg total) by mouth 3 (three) times daily.  Dispense: 90 tablet; Refill: 1  4. DUB (dysfunctional uterine bleeding) - megestrol (MEGACE) 40 MG tablet; 1 tablet daily  Dispense: 30 tablet; Refill: 5    Continue all other maintenance medications as listed above.  Follow up plan: No follow-ups on file.  Educational handout given for survey  Remus Loffler PA-C [facility] [address]  [place], [place] [address] [phone]   07/25/2017, 1:21 PM

## 2017-07-26 ENCOUNTER — Telehealth: Payer: Self-pay

## 2017-07-26 ENCOUNTER — Other Ambulatory Visit: Payer: Self-pay | Admitting: Physician Assistant

## 2017-07-26 MED ORDER — IPRATROPIUM BROMIDE 0.06 % NA SOLN: 2.0000 | Freq: Four times a day (QID) | NASAL | 12 refills | Status: DC

## 2017-07-26 NOTE — Telephone Encounter (Signed)
Insurance denied prior auth for [employer]  Preferred drug Azelastine , flunisolide nasal sp fluticasone propionate nasal sp and ipratropium nasal sp

## 2017-07-26 NOTE — Telephone Encounter (Signed)
Sent ipratropium spray

## 2017-07-27 NOTE — Telephone Encounter (Signed)
Patient aware.

## 2017-08-09 ENCOUNTER — Encounter: Payer: Self-pay | Admitting: Family Medicine

## 2017-08-09 ENCOUNTER — Ambulatory Visit (INDEPENDENT_AMBULATORY_CARE_PROVIDER_SITE_OTHER): Admitting: Family Medicine

## 2017-08-09 VITALS — BP 124/78 | HR 78 | Temp 98.3°F | Ht 65.0 in | Wt 160.0 lb

## 2017-08-09 DIAGNOSIS — J069 Acute upper respiratory infection, unspecified: Secondary | ICD-10-CM

## 2017-08-09 DIAGNOSIS — J029 Acute pharyngitis, unspecified: Secondary | ICD-10-CM | POA: Diagnosis not present

## 2017-08-09 DIAGNOSIS — B9789 Other viral agents as the cause of diseases classified elsewhere: Secondary | ICD-10-CM

## 2017-08-09 LAB — CULTURE, GROUP A STREP

## 2017-08-09 LAB — RAPID STREP SCREEN (MED CTR MEBANE ONLY): Strep Gp A Ag, IA W/Reflex: NEGATIVE

## 2017-08-09 MED ORDER — LIDOCAINE VISCOUS HCL 2 % MT SOLN: OROMUCOSAL | 0 refills | Status: DC

## 2017-08-09 NOTE — Progress Notes (Signed)
Subjective: CC: "not feeling well" PCP: Remus Loffler, PA-C RUE:[id number] R. Wilhoite is a 42 y.o. female presenting to clinic today for:  1.  URI symptoms Patient reports that she has had a sore throat, decreased energy, rhinorrhea and dry cough for about 24 hours now.  She denies any associated fevers, nausea, vomiting, diarrhea.  She is tolerating oral intake without difficulty.  She has multiple sick contacts at work.  She works with the public at a dealership.  She uses Singulair, Nasonex, Atrovent, Zyrtec and Benadryl for allergy symptoms.  She notes little improvement in symptoms with the above therapies.  No associated wheeze, shortness of breath.  No hemoptysis.  She has an infant grandchild at home and wants to make sure that she does not have anything that is contagious.  Additionally, she notes that she is working on smoking cessation.   ROS: Per HPI  No Known Allergies Past Medical History:  Diagnosis Date  . Anxiety   . Arthritis   . Asthma   . Depression   . Migraines     Current Outpatient Medications:  .  acetaminophen (TYLENOL) 500 MG tablet, Take 500 mg by mouth every 6 (six) hours as needed., Disp: , Rfl:  .  ALPRAZolam (XANAX) 1 MG tablet, Take 1 tablet (1 mg total) by mouth 3 (three) times daily., Disp: 90 tablet, Rfl: 1 .  buPROPion (WELLBUTRIN XL) 150 MG 24 hr tablet, Take 1 tablet (150 mg total) by mouth daily., Disp: 30 tablet, Rfl: 5 .  cetirizine (ZYRTEC) 10 MG tablet, Take 10 mg by mouth daily., Disp: , Rfl:  .  diphenhydrAMINE (BENADRYL) 50 MG tablet, Take 50 mg by mouth at bedtime as needed for itching., Disp: , Rfl:  .  escitalopram (LEXAPRO) 20 MG tablet, Take 1.5 tablets (30 mg total) by mouth daily., Disp: 45 tablet, Rfl: 5 .  Glucosamine-Chondroit-Vit C-Mn (GLUCOSAMINE CHONDR 1500 COMPLX PO), Take by mouth., Disp: , Rfl:  .  HYDROcodone-acetaminophen (NORCO/VICODIN) 5-325 MG tablet, Take 1 tablet by mouth every 4 (four) hours as needed for moderate  pain., Disp: 40 tablet, Rfl: 0 .  ipratropium (ATROVENT) 0.06 % nasal spray, Place 2 sprays into both nostrils 4 (four) times daily., Disp: 15 mL, Rfl: 12 .  levonorgestrel (MIRENA) 20 MCG/24HR IUD, 1 each by Intrauterine route once., Disp: , Rfl:  .  megestrol (MEGACE) 40 MG tablet, 1 tablet daily, Disp: 30 tablet, Rfl: 5 .  Melatonin 10 MG CAPS, Take 20 mg by mouth at bedtime., Disp: , Rfl:  .  meloxicam (MOBIC) 7.5 MG tablet, Take 1 tablet (7.5 mg total) by mouth daily., Disp: 30 tablet, Rfl: 11 .  mometasone (NASONEX) 50 MCG/ACT nasal spray, Place 2 sprays into the nose daily., Disp: 17 g, Rfl: 12 .  montelukast (SINGULAIR) 10 MG tablet, Take 1 tablet (10 mg total) by mouth at bedtime., Disp: 30 tablet, Rfl: 11 .  Oxcarbazepine (TRILEPTAL) 300 MG tablet, Take 1 tablet (300 mg total) by mouth at bedtime., Disp: 30 tablet, Rfl: 11 .  pseudoephedrine (SUDAFED) 30 MG tablet, Take 30 mg by mouth every 4 (four) hours as needed for congestion., Disp: , Rfl:  Social History   Socioeconomic History  . Marital status: Married    Spouse name: Not on file  . Number of children: Not on file  . Years of education: Not on file  . Highest education level: Not on file  Occupational History  . Not on file  Social Needs  .  Financial resource strain: Not on file  . Food insecurity:    Worry: Not on file    Inability: Not on file  . Transportation needs:    Medical: Not on file    Non-medical: Not on file  Tobacco Use  . Smoking status: Current Every Day Smoker    Packs/day: 1.00    Types: Cigarettes  . Smokeless tobacco: Never Used  Substance and Sexual Activity  . Alcohol use: No    Frequency: Never  . Drug use: No  . Sexual activity: Yes    Birth control/protection: IUD  Lifestyle  . Physical activity:    Days per week: Not on file    Minutes per session: Not on file  . Stress: Not on file  Relationships  . Social connections:    Talks on phone: Not on file    Gets together: Not on  file    Attends religious service: Not on file    Active member of club or organization: Not on file    Attends meetings of clubs or organizations: Not on file    Relationship status: Not on file  . Intimate partner violence:    Fear of current or ex partner: Not on file    Emotionally abused: Not on file    Physically abused: Not on file    Forced sexual activity: Not on file  Other Topics Concern  . Not on file  Social History Narrative  . Not on file   Family History  Problem Relation Age of Onset  . Depression Mother   . Depression Daughter     Objective: Office vital signs reviewed. BP 124/78   Pulse 78   Temp 98.3 F (36.8 C) (Oral)   Ht 5\' 5"  (1.651 m)   Wt 160 lb (72.6 kg)   BMI 26.63 kg/m   Physical Examination:  General: Awake, alert, well nourished, nontoxic, No acute distress HEENT: Normal    Neck: No masses palpated. No lymphadenopathy    Ears: Tympanic membranes intact, normal light reflex, no erythema, no bulging    Eyes: PERRLA, extraocular membranes intact, sclera white    Nose: nasal turbinates moist, clear nasal discharge    Throat: moist mucus membranes, no erythema, no tonsillar exudate.  Airway is patent Cardio: regular rate and rhythm, S1S2 heard, no murmurs appreciated Pulm: clear to auscultation bilaterally, no wheezes, rhonchi or rales; normal work of breathing on room air  Assessment/ Plan: 42 y.o. female   1. Viral URI with cough Patient is afebrile and nontoxic-appearing.  I suspect that she has a viral URI given recent sick contacts.  Her rapid strep was negative.  I have prescribed her viscous lidocaine to gargle for sore throat.  May use Mobic or other NSAID for pain and inflammation.  Okay to continue current antihistamine and allergy therapies.  Home care instructions were reviewed with the patient.  Reasons for return discussed.  Patient voiced good understanding.  2. Sore throat - Rapid Strep Screen ([facility] ONLY)   Orders  Placed This Encounter  Procedures  . Rapid Strep Screen ([facility] ONLY)   Meds ordered this encounter  Medications  . lidocaine (XYLOCAINE) 2 % solution    Sig: Gargle and spit 10 mL every 6 hours as needed for sore throat.    Dispense:  120 mL    Refill:  0     Jeferson Boozer Hulen Skains, DO [facility] [phone]

## 2017-08-09 NOTE — Patient Instructions (Signed)
Your strep test is negative.  I think this is probably related to a viral illness given your lack of bacterial symptoms.  I have ordered a viscous lidocaine solution for you to use for sore throat.  You may gargle 10 mL of this every 6 hours if needed.  Consider using the meloxicam or ibuprofen or naproxen for pain and inflammation.  If for any reason you start developing fevers, pus from your nose, you start coughing up blood or brown mucus, please call for reevaluation as you may need an antibiotic.  It appears that you have a viral upper respiratory infection (cold).  Cold symptoms can last up to 2 weeks.    - Get plenty of rest and drink plenty of fluids. - Try to breathe moist air. Use a cold mist humidifier. - Consume warm fluids (soup or tea) to provide relief for a stuffy nose and to loosen phlegm. - For nasal stuffiness, try saline nasal spray or a Neti Pot. Afrin nasal spray can also be used but this product should not be used longer than 3 days or it will cause rebound nasal stuffiness (worsening nasal congestion). - For sore throat pain relief: use chloraseptic spray, suck on throat lozenges, hard candy or popsicles; gargle with warm salt water (1/4 tsp. salt per 8 oz. of water); and eat soft, bland foods. - Eat a well-balanced diet. If you cannot, ensure you are getting enough nutrients by taking a daily multivitamin. - Avoid dairy products, as they can thicken phlegm. - Avoid alcohol, as it impairs your body's immune system.  CONTACT YOUR DOCTOR IF YOU EXPERIENCE ANY OF THE FOLLOWING: - High fever - Ear pain - Sinus-type headache - Unusually severe cold symptoms - Cough that gets worse while other cold symptoms improve - Flare up of any chronic lung problem, such as asthma - Your symptoms persist longer than 2 weeks

## 2017-09-14 ENCOUNTER — Encounter: Payer: Self-pay | Admitting: Family Medicine

## 2017-09-14 ENCOUNTER — Ambulatory Visit (INDEPENDENT_AMBULATORY_CARE_PROVIDER_SITE_OTHER): Admitting: Family Medicine

## 2017-09-14 VITALS — BP 129/72 | HR 90 | Temp 98.7°F | Ht 65.0 in | Wt 158.0 lb

## 2017-09-14 DIAGNOSIS — R51 Headache: Secondary | ICD-10-CM | POA: Diagnosis not present

## 2017-09-14 DIAGNOSIS — R519 Headache, unspecified: Secondary | ICD-10-CM

## 2017-09-14 DIAGNOSIS — G43701 Chronic migraine without aura, not intractable, with status migrainosus: Secondary | ICD-10-CM | POA: Diagnosis not present

## 2017-09-14 DIAGNOSIS — M25551 Pain in right hip: Secondary | ICD-10-CM

## 2017-09-14 DIAGNOSIS — G8929 Other chronic pain: Secondary | ICD-10-CM | POA: Diagnosis not present

## 2017-09-14 MED ORDER — SUMATRIPTAN SUCCINATE 50 MG PO TABS: 50.0000 mg | ORAL_TABLET | Freq: Once | ORAL | 0 refills | Status: DC

## 2017-09-14 MED ORDER — METHYLPREDNISOLONE ACETATE 80 MG/ML IJ SUSP
80.0000 mg | Freq: Once | INTRAMUSCULAR | Status: AC
  Administered 2017-09-14: 80 mg via INTRAMUSCULAR

## 2017-09-14 NOTE — Patient Instructions (Addendum)
Your were given a dose of Depomedrol in office today.  This should help with your hip and headache.   I have sent in Imitrex pills.  Follow up w/ Lawanna Kobus in 2-4 weeks for migraines.  She may need to start a preventative medication.  Some of the injectables for migraines are Emgality, Aimovig, Ajovy.  Research this class and see if it interests you.   Migraine Headache A migraine headache is a very strong throbbing pain on one side or both sides of your head. Migraines can also cause other symptoms. Talk with your doctor about what things may bring on (trigger) your migraine headaches. Follow these instructions at home: Medicines  Take over-the-counter and prescription medicines only as told by your doctor.  Do not drive or use heavy machinery while taking prescription pain medicine.  To prevent or treat constipation while you are taking prescription pain medicine, your doctor may recommend that you: ? Drink enough fluid to keep your pee (urine) clear or pale yellow. ? Take over-the-counter or prescription medicines. ? Eat foods that are high in fiber. These include fresh fruits and vegetables, whole grains, and beans. ? Limit foods that are high in fat and processed sugars. These include fried and sweet foods. Lifestyle  Avoid alcohol.  Do not use any products that contain nicotine or tobacco, such as cigarettes and e-cigarettes. If you need help quitting, ask your doctor.  Get at least 8 hours of sleep every night.  Limit your stress. General instructions   Keep a journal to find out what may bring on your migraines. For example, write down: ? What you eat and drink. ? How much sleep you get. ? Any change in what you eat or drink. ? Any change in your medicines.  If you have a migraine: ? Avoid things that make your symptoms worse, such as bright lights. ? It may help to lie down in a dark, quiet room. ? Do not drive or use heavy machinery. ? Ask your doctor what activities  are safe for you.  Keep all follow-up visits as told by your doctor. This is important. Contact a doctor if:  You get a migraine that is different or worse than your usual migraines. Get help right away if:  Your migraine gets very bad.  You have a fever.  You have a stiff neck.  You have trouble seeing.  Your muscles feel weak or like you cannot control them.  You start to lose your balance a lot.  You start to have trouble walking.  You pass out (faint). This information is not intended to replace advice given to you by your health care provider. Make sure you discuss any questions you have with your health care provider. Document Released: 12/23/2007 Document Revised: 10/03/2015 Document Reviewed: 09/01/2015 Elsevier Interactive Patient Education  2018 [employer].

## 2017-09-14 NOTE — Progress Notes (Signed)
Subjective: CC: daily headache, right hip pain PCP: Remus Loffler, PA-C [name] is a 42 y.o. female presenting to clinic today for:  1. Daily headache/Migraine Patient reports that she had onset of migraine headaches at age 37.  She has several per month but notes that she has a daily headache as well.  She reports that she was seen by neurology when she lived up [place].  Multiple studies were obtained including brain imaging which were unrevealing.  She notes that she has had physical therapy, Botox in the past with little improvement in daily headaches.  She has used over-the-counter analgesics as well as her Norco, which is prescribed by her PCP with little relief of her symptoms.  She used to use an Imitrex injectable which helped the migraine headaches but she notes that this caused jaw tightness so she abandoned that.  She is interested in revisiting the medication because migraine headaches have been so severe.  She reports associated nausea, photophobia and phonophobia.  Denies any visual disturbance, gait abnormality, confusion, weakness.  No sinus symptoms, rhinorrhea.  Denies snoring or apneic episodes.  She is an every day smoker.  2. Right hip pain Patient reports that she has a history of bursitis of the right hip.  She has been given medication in the past to help with this.  She is never had an injection of the hip but she notes that it so severe today that she is willing to have this done.  She reports that hip pain is refractory to home medications at this point.  Pain seems to be worse with getting up from a seated position and lifting right lower extremity.  She points to the posterior lateral aspect of the right hip as a source of pain.  Denies any radiation to the groin.  Though she does note that it radiates some down her thigh.  No numbness or tingling.   ROS: Per HPI  No Known Allergies Past Medical History:  Diagnosis Date  . Anxiety   . Arthritis   .  Asthma   . Depression   . Migraines     Current Outpatient Medications:  .  acetaminophen (TYLENOL) 500 MG tablet, Take 500 mg by mouth every 6 (six) hours as needed., Disp: , Rfl:  .  ALPRAZolam (XANAX) 1 MG tablet, Take 1 tablet (1 mg total) by mouth 3 (three) times daily., Disp: 90 tablet, Rfl: 1 .  buPROPion (WELLBUTRIN XL) 150 MG 24 hr tablet, Take 1 tablet (150 mg total) by mouth daily., Disp: 30 tablet, Rfl: 5 .  cetirizine (ZYRTEC) 10 MG tablet, Take 10 mg by mouth daily., Disp: , Rfl:  .  escitalopram (LEXAPRO) 20 MG tablet, Take 1.5 tablets (30 mg total) by mouth daily., Disp: 45 tablet, Rfl: 5 .  Glucosamine-Chondroit-Vit C-Mn (GLUCOSAMINE CHONDR 1500 COMPLX PO), Take by mouth., Disp: , Rfl:  .  HYDROcodone-acetaminophen (NORCO/VICODIN) 5-325 MG tablet, Take 1 tablet by mouth every 4 (four) hours as needed for moderate pain., Disp: 40 tablet, Rfl: 0 .  ipratropium (ATROVENT) 0.06 % nasal spray, Place 2 sprays into both nostrils 4 (four) times daily., Disp: 15 mL, Rfl: 12 .  levonorgestrel (MIRENA) 20 MCG/24HR IUD, 1 each by Intrauterine route once., Disp: , Rfl:  .  megestrol (MEGACE) 40 MG tablet, 1 tablet daily, Disp: 30 tablet, Rfl: 5 .  Melatonin 10 MG CAPS, Take 20 mg by mouth at bedtime., Disp: , Rfl:  .  meloxicam (MOBIC) 7.5  MG tablet, Take 1 tablet (7.5 mg total) by mouth daily., Disp: 30 tablet, Rfl: 11 .  mometasone (NASONEX) 50 MCG/ACT nasal spray, Place 2 sprays into the nose daily., Disp: 17 g, Rfl: 12 .  montelukast (SINGULAIR) 10 MG tablet, Take 1 tablet (10 mg total) by mouth at bedtime., Disp: 30 tablet, Rfl: 11 .  Oxcarbazepine (TRILEPTAL) 300 MG tablet, Take 1 tablet (300 mg total) by mouth at bedtime., Disp: 30 tablet, Rfl: 11 .  pseudoephedrine (SUDAFED) 30 MG tablet, Take 30 mg by mouth every 4 (four) hours as needed for congestion., Disp: , Rfl:  Social History   Socioeconomic History  . Marital status: Married    Spouse name: Not on file  . Number of  children: Not on file  . Years of education: Not on file  . Highest education level: Not on file  Occupational History  . Not on file  Social Needs  . Financial resource strain: Not on file  . Food insecurity:    Worry: Not on file    Inability: Not on file  . Transportation needs:    Medical: Not on file    Non-medical: Not on file  Tobacco Use  . Smoking status: Current Every Day Smoker    Packs/day: 1.00    Types: Cigarettes  . Smokeless tobacco: Never Used  Substance and Sexual Activity  . Alcohol use: No    Frequency: Never  . Drug use: No  . Sexual activity: Yes    Birth control/protection: IUD  Lifestyle  . Physical activity:    Days per week: Not on file    Minutes per session: Not on file  . Stress: Not on file  Relationships  . Social connections:    Talks on phone: Not on file    Gets together: Not on file    Attends religious service: Not on file    Active member of club or organization: Not on file    Attends meetings of clubs or organizations: Not on file    Relationship status: Not on file  . Intimate partner violence:    Fear of current or ex partner: Not on file    Emotionally abused: Not on file    Physically abused: Not on file    Forced sexual activity: Not on file  Other Topics Concern  . Not on file  Social History Narrative  . Not on file   Family History  Problem Relation Age of Onset  . Depression Mother   . Depression Daughter     Objective: Office vital signs reviewed. BP 129/72   Pulse 90   Temp 98.7 F (37.1 C) (Oral)   Ht 5\' 5"  (1.651 m)   Wt 158 lb (71.7 kg)   BMI 26.29 kg/m   Physical Examination:  General: Awake, alert, well nourished, No acute distress HEENT: Normal    Neck: No masses palpated. No lymphadenopathy    Ears: Tympanic membranes intact, normal light reflex, no erythema, no bulging    Eyes: PERRLA, extraocular membranes intact, sclera white    Nose: nasal turbinates moist, no nasal discharge    Throat:  moist mucus membranes, no erythema, no tonsillar exudate.  Airway is patent Cardio: regular rate Pulm: normal work of breathing on room air Extremities: warm, well perfused, No edema, cyanosis or clubbing; +2 pulses bilaterally MSK: antalgic gait and normal station  Right hip: Patient has full active range of motion but does have pain with abduction and flexion.  No  tenderness to palpation over the greater trochanter.  She has pain with FADIR and FABER. Mild pain w/ straight leg raise. Skin: dry; intact; no rashes or lesions Neuro: 5/5 right hip Strength except in abduction 4/5 and light touch sensation grossly intact, cranial nerves II through XII grossly intact.  No focal neurologic deficits.  Assessment/ Plan: 42 y.o. female   1. Chronic migraine without aura with status migrainosus, not intractable Currently has migraine headache.  She drove herself therefore Depo-Medrol was given and no promethazine.  I did offer to send her an antiemetic but patient declined this.  I have also prescribed her Imitrex 50 mg to use at onset of migraine headache if it is refractory to home medications.  May repeat 1 dose in 2 hours if persistent.  We discussed that this should not be used more than twice a day or more than 2 times per week.  I have asked her to follow-up with her PCP in the next 2 to 4 weeks for recheck.  Could consider prophylaxis with propranolol.  We also discussed injectable monthly medication.  I have given her the names of these medications and asked her to check her insurance to see if these would be covered.  Home care instructions were reviewed.  Reasons for return and emergent evaluation reviewed.  Follow-up as directed. - methylPREDNISolone acetate (DEPO-MEDROL) injection 80 mg  2. Daily headache As above.  Possibly related to stress versus rebound headache versus caffeine intake versus poor sleep current smoking status.  Follow-up as above  3. Chronic right hip pain Does not seem to  be a bursitis.  Patient does not have tenderness over the greater trochanteric bursa.  I favor more labral versus SI joint irritation and inflammation.  She had a positive FABER and FADIR today but she also had pain with straight leg raise on the right.  Also given radiation to the right thigh I question lumbar involvement.  Depo-Medrol given intramuscularly today.  We discussed consideration for referral to orthopedics for directed injection if needed.  Could consider imaging at some point as well.  She will follow-up with PCP. - methylPREDNISolone acetate (DEPO-MEDROL) injection 80 mg  Meds ordered this encounter  Medications  . methylPREDNISolone acetate (DEPO-MEDROL) injection 80 mg  . SUMAtriptan (IMITREX) 50 MG tablet    Sig: Take 1 tablet (50 mg total) by mouth once for 1 dose. May repeat ONCE in 2 hours if headache persists or recurs.    Dispense:  10 tablet    Refill:  0     Kimberly Bluestein Hulen Skains, DO [facility] [phone]

## 2017-09-26 ENCOUNTER — Telehealth: Payer: Self-pay | Admitting: Physician Assistant

## 2017-09-26 ENCOUNTER — Other Ambulatory Visit: Payer: Self-pay | Admitting: Physician Assistant

## 2017-09-26 NOTE — Telephone Encounter (Signed)
Patient will call back to schedule an appt

## 2017-09-26 NOTE — Telephone Encounter (Signed)
We do not refill narcotic medications outside of office visits. She will have to be seen for that medication

## 2017-09-26 NOTE — Telephone Encounter (Signed)
Patient states she does not know if it is mobic or not why cant we look in the chart and tell her which it was. She got it filled last at [facility] may can call them to fill out. Please advise.

## 2017-09-26 NOTE — Telephone Encounter (Signed)
mobic was sent 03/2016 for a year's refills. Is this the med? The med is in the chart

## 2017-09-26 NOTE — Telephone Encounter (Signed)
I think she is referring to the pain medication that was give in April according to the office note. Please advise and route to pool A

## 2017-09-26 NOTE — Telephone Encounter (Signed)
Patient called back to state the med for bursitis was Hydrocdone. Please advise

## 2017-10-04 ENCOUNTER — Encounter: Payer: Self-pay | Admitting: Physician Assistant

## 2017-10-04 ENCOUNTER — Ambulatory Visit (INDEPENDENT_AMBULATORY_CARE_PROVIDER_SITE_OTHER): Admitting: Physician Assistant

## 2017-10-04 VITALS — BP 134/84 | HR 90 | Temp 99.0°F | Ht 65.0 in | Wt 159.6 lb

## 2017-10-04 DIAGNOSIS — M7071 Other bursitis of hip, right hip: Secondary | ICD-10-CM | POA: Diagnosis not present

## 2017-10-04 DIAGNOSIS — G43701 Chronic migraine without aura, not intractable, with status migrainosus: Secondary | ICD-10-CM

## 2017-10-04 MED ORDER — GALCANEZUMAB-GNLM 120 MG/ML ~~LOC~~ SOAJ: 240.0000 mg | SUBCUTANEOUS | 0 refills | Status: DC

## 2017-10-04 MED ORDER — HYDROCODONE-ACETAMINOPHEN 5-325 MG PO TABS: 1.0000 | ORAL_TABLET | ORAL | 0 refills | Status: DC | PRN

## 2017-10-04 NOTE — Progress Notes (Signed)
BP 134/84   Pulse 90   Temp 99 F (37.2 C) (Oral)   Ht 5\' 5"  (1.651 m)   Wt 159 lb 9.6 oz (72.4 kg)   BMI 26.56 kg/m     Subjective:    Patient ID: Kimberly Terrell, female    DOB: 07/20/1975, 42 y.o.   MRN: [id number]  HPI: Kimberly Terrell is a 42 y.o. female presenting on 10/04/2017 for Hip Pain (right ) and Headache  Patient comes in for recheck on her migraine headaches.  At her last visit which was an urgent visit Dr. Nadine Counts discussed the new prevention medicines.  She would like to try 1 of them.  She has checked with her insurance which is [employer], it states that they all can be covered but with the prior auth.  We are going to try Emgality.  The patient has had other prevention medications in the past that were unsuccessful.  She tried Topamax, Botox, physical therapy, Elavil, Cymbalta.  For treatment she has used hot packs, Maxalt.  She is currently on Imitrex.  But she has nearly daily headaches.  She is also having pain in her right hip which is a continued problem.  She would like to see orthopedist at this time.  Past Medical History:  Diagnosis Date  . Anxiety   . Arthritis   . Asthma   . Depression   . Migraines    Relevant past medical, surgical, family and social history reviewed and updated as indicated. Interim medical history since our last visit reviewed. Allergies and medications reviewed and updated. DATA REVIEWED: CHART IN EPIC  Family History reviewed for pertinent findings.  Review of Systems  Constitutional: Negative.   HENT: Negative.   Eyes: Negative.   Respiratory: Negative.   Gastrointestinal: Negative.   Genitourinary: Negative.   Musculoskeletal: Positive for arthralgias and myalgias.  Neurological: Positive for weakness and headaches. Negative for tremors, seizures, syncope and speech difficulty.    Allergies as of 10/04/2017   No Known Allergies     Medication List        Accurate as of 10/04/17  1:45 PM. Always use your  most recent med list.          acetaminophen 500 MG tablet Commonly known as:  TYLENOL Take 500 mg by mouth every 6 (six) hours as needed.   ALPRAZolam 1 MG tablet Commonly known as:  XANAX Take 1 tablet (1 mg total) by mouth 3 (three) times daily.   buPROPion 150 MG 24 hr tablet Commonly known as:  WELLBUTRIN XL Take 1 tablet (150 mg total) by mouth daily.   cetirizine 10 MG tablet Commonly known as:  ZYRTEC Take 10 mg by mouth daily.   escitalopram 20 MG tablet Commonly known as:  LEXAPRO Take 1.5 tablets (30 mg total) by mouth daily.   Galcanezumab-gnlm 120 MG/ML Soaj Commonly known as:  EMGALITY Inject 240 mg into the skin every 30 (thirty) days.   GLUCOSAMINE CHONDR 1500 COMPLX PO Take by mouth.   HYDROcodone-acetaminophen 5-325 MG tablet Commonly known as:  NORCO/VICODIN Take 1 tablet by mouth every 4 (four) hours as needed for moderate pain.   ipratropium 0.06 % nasal spray Commonly known as:  ATROVENT Place 2 sprays into both nostrils 4 (four) times daily.   levonorgestrel 20 MCG/24HR IUD Commonly known as:  MIRENA 1 each by Intrauterine route once.   megestrol 40 MG tablet Commonly known as:  MEGACE 1 tablet daily   Melatonin  10 MG Caps Take 20 mg by mouth at bedtime.   meloxicam 7.5 MG tablet Commonly known as:  MOBIC Take 1 tablet (7.5 mg total) by mouth daily.   mometasone 50 MCG/ACT nasal spray Commonly known as:  NASONEX Place 2 sprays into the nose daily.   montelukast 10 MG tablet Commonly known as:  SINGULAIR Take 1 tablet (10 mg total) by mouth at bedtime.   Oxcarbazepine 300 MG tablet Commonly known as:  TRILEPTAL Take 1 tablet (300 mg total) by mouth at bedtime.   pseudoephedrine 30 MG tablet Commonly known as:  SUDAFED Take 30 mg by mouth every 4 (four) hours as needed for congestion.   SUMAtriptan 50 MG tablet Commonly known as:  IMITREX Take 1 tablet (50 mg total) by mouth once for 1 dose. May repeat ONCE in 2 hours if  headache persists or recurs.          Objective:    BP 134/84   Pulse 90   Temp 99 F (37.2 C) (Oral)   Ht 5\' 5"  (1.651 m)   Wt 159 lb 9.6 oz (72.4 kg)   BMI 26.56 kg/m    No Known Allergies  Wt Readings from Last 3 Encounters:  10/04/17 159 lb 9.6 oz (72.4 kg)  09/14/17 158 lb (71.7 kg)  08/09/17 160 lb (72.6 kg)    Physical Exam  Constitutional: She is oriented to person, place, and time. She appears well-developed and well-nourished.  HENT:  Head: Normocephalic and atraumatic.  Eyes: Pupils are equal, round, and reactive to light. Conjunctivae and EOM are normal.  Cardiovascular: Normal rate, regular rhythm, normal heart sounds and intact distal pulses.  Pulmonary/Chest: Effort normal and breath sounds normal.  Abdominal: Soft. Bowel sounds are normal.  Musculoskeletal:       Right hip: She exhibits tenderness. She exhibits normal range of motion.       Legs: Neurological: She is alert and oriented to person, place, and time. She has normal reflexes.  Skin: Skin is warm and dry. No rash noted.  Psychiatric: She has a normal mood and affect. Her behavior is normal. Judgment and thought content normal.    Results for orders placed or performed in visit on 08/09/17  Rapid Strep Screen ([facility] ONLY)  Result Value Ref Range   Strep Gp A Ag, IA W/Reflex Negative Negative  Culture, Group A Strep  Result Value Ref Range   Strep A Culture CANCELED       Assessment & Plan:   1. Bursitis of right hip, unspecified bursa - HYDROcodone-acetaminophen (NORCO/VICODIN) 5-325 MG tablet; Take 1 tablet by mouth every 4 (four) hours as needed for moderate pain.  Dispense: 40 tablet; Refill: 0 - Ambulatory referral to Orthopedic Surgery  2. Chronic migraine without aura with status migrainosus, not intractable - Galcanezumab-gnlm (EMGALITY) 120 MG/ML SOAJ; Inject 240 mg into the skin every 30 (thirty) days.  Dispense: 2 mL; Refill: 0    Continue all other maintenance  medications as listed above.  Follow up plan: Recheck 1 month  Educational handout given for survey  Remus Loffler PA-C [facility] [address]  [place], [place] [address] [phone]   10/04/2017, 1:45 PM

## 2017-10-06 ENCOUNTER — Telehealth: Payer: Self-pay

## 2017-10-06 NOTE — Telephone Encounter (Signed)
Insurance denied [employer]   Med needs to be prescribed by Neurologist or in consultation with Neurologist

## 2017-10-06 NOTE — Telephone Encounter (Signed)
Let patient know and ask if a referral can be made for her. Sorry to hear this.

## 2017-10-07 ENCOUNTER — Other Ambulatory Visit: Payer: Self-pay | Admitting: Physician Assistant

## 2017-10-07 DIAGNOSIS — G43701 Chronic migraine without aura, not intractable, with status migrainosus: Secondary | ICD-10-CM

## 2017-10-07 NOTE — Telephone Encounter (Signed)
Order placed

## 2017-10-07 NOTE — Telephone Encounter (Signed)
Patient would like referral to neurology.

## 2017-10-12 ENCOUNTER — Telehealth: Payer: Self-pay | Admitting: *Deleted

## 2017-10-12 ENCOUNTER — Encounter: Payer: Self-pay | Admitting: Neurology

## 2017-10-12 ENCOUNTER — Telehealth: Payer: Self-pay | Admitting: Neurology

## 2017-10-12 ENCOUNTER — Ambulatory Visit (INDEPENDENT_AMBULATORY_CARE_PROVIDER_SITE_OTHER): Admitting: Neurology

## 2017-10-12 VITALS — BP 118/70 | HR 98 | Ht 64.0 in | Wt 160.2 lb

## 2017-10-12 DIAGNOSIS — R519 Headache, unspecified: Secondary | ICD-10-CM | POA: Insufficient documentation

## 2017-10-12 DIAGNOSIS — G43919 Migraine, unspecified, intractable, without status migrainosus: Secondary | ICD-10-CM

## 2017-10-12 DIAGNOSIS — R51 Headache: Secondary | ICD-10-CM

## 2017-10-12 MED ORDER — TIZANIDINE HCL 2 MG PO CAPS: 2.0000 mg | ORAL_CAPSULE | Freq: Every evening | ORAL | 2 refills | Status: DC | PRN

## 2017-10-12 NOTE — Progress Notes (Signed)
[facility] [address] [place]. [place] [address] [phone]       OFFICE CONSULT NOTE  [name] Date of Birth:  11/24/1975 Medical Record Number:  [id number]   Referring MD:  Kimberly Feeler, PA-C  Reason for Referral:  Refractory migraines  HPI: Kimberly Terrell is a pleasant 42 year old Caucasian lady who is referred for refractory headaches. History is obtained from the patient and review of medical records from referring physician. Kimberly Terrell has had long-standing migraine headaches and states she's had chronic daily headaches for the last 15-16 years. She describes the headache as waning in intensity from 7/10-10/10 and occurring daily. Headache is described as severe throbbing bifrontal accompanied by some light sensitivity and occasional nausea. Headache does interfere with her day-to-day activities. She does complain of tightness in the posterior neck muscles. She does not do any regular activities for stress laxation on neck exercises on a daily basis. She takes around 3 tablets of Tylenol 4-5 times a day on average. She occasionally takes ibuprofen as well. She finds some relief with Imitrex but does not like it since it causes some numbness in her jaw. She has tried several medications for headache prophylaxis in the prior years including Topamax, Depakote, amitriptyline as well as Botox injections. She was being seen by neurologist in [facility] and has moved to [place] recently. I do not have any prior records of a little. She was given a prescription for the new CG RP antagonist galcanezumab by her primary physician but her insurance company refused it without neurologist referral and she is here to see me today. She denies any loss of vision, slurred speech, focal neurological symptoms accompanying headaches. She states she's had multiple CT scans as well as MRI scans of the brain in the past all of which have been normal but I do not have any of those  reports or films to review today. She does have a history of long-standing anxiety and depression and is on beta blocker and an Trileptal and has seen a psychiatrist in the past for the same.  ROS:   14 system review of systems is positive for  headache, neck pain, anxiety, depression and all other systems negative  PMH:  Past Medical History:  Diagnosis Date  . Anxiety   . Arthritis   . Asthma   . Depression   . Migraines     Social History:  Social History   Socioeconomic History  . Marital status: Married    Spouse name: Kimberly Terrell  . Number of children: 1  . Years of education: Not on file  . Highest education level: Not on file  Occupational History  . Occupation: [employer]  Social Needs  . Financial resource strain: Not on file  . Food insecurity:    Worry: Not on file    Inability: Not on file  . Transportation needs:    Medical: Not on file    Non-medical: Not on file  Tobacco Use  . Smoking status: Current Every Day Smoker    Packs/day: 1.00    Types: Cigarettes  . Smokeless tobacco: Never Used  Substance and Sexual Activity  . Alcohol use: No    Frequency: Never  . Drug use: No  . Sexual activity: Yes    Birth control/protection: IUD  Lifestyle  . Physical activity:    Days per week: Not on file    Minutes per session: Not on file  . Stress: Not on  file  Relationships  . Social connections:    Talks on phone: Not on file    Gets together: Not on file    Attends religious service: Not on file    Active member of club or organization: Not on file    Attends meetings of clubs or organizations: Not on file    Relationship status: Not on file  . Intimate partner violence:    Fear of current or ex partner: Not on file    Emotionally abused: Not on file    Physically abused: Not on file    Forced sexual activity: Not on file  Other Topics Concern  . Not on file  Social History Narrative   Right handed    Lives with Kimberly Terrell   Caffeine  use: coffee-daily    Medications:   Current Outpatient Medications on File Prior to Visit  Medication Sig Dispense Refill  . acetaminophen (TYLENOL) 500 MG tablet Take 500 mg by mouth every 6 (six) hours as needed.    . ALPRAZolam (XANAX) 1 MG tablet Take 1 tablet (1 mg total) by mouth 3 (three) times daily. 90 tablet 1  . buPROPion (WELLBUTRIN XL) 150 MG 24 hr tablet Take 1 tablet (150 mg total) by mouth daily. 30 tablet 5  . cetirizine (ZYRTEC) 10 MG tablet Take 10 mg by mouth daily.    Marland Kitchen escitalopram (LEXAPRO) 20 MG tablet Take 1.5 tablets (30 mg total) by mouth daily. 45 tablet 5  . Galcanezumab-gnlm (EMGALITY) 120 MG/ML SOAJ Inject 240 mg into the skin every 30 (thirty) days. 2 mL 0  . Glucosamine-Chondroit-Vit C-Mn (GLUCOSAMINE CHONDR 1500 COMPLX PO) Take by mouth.    Marland Kitchen HYDROcodone-acetaminophen (NORCO/VICODIN) 5-325 MG tablet Take 1 tablet by mouth every 4 (four) hours as needed for moderate pain. 40 tablet 0  . ipratropium (ATROVENT) 0.06 % nasal spray Place 2 sprays into both nostrils 4 (four) times daily. 15 mL 12  . levonorgestrel (MIRENA) 20 MCG/24HR IUD 1 each by Intrauterine route once.    . megestrol (MEGACE) 40 MG tablet 1 tablet daily 30 tablet 5  . Melatonin 10 MG CAPS Take 20 mg by mouth at bedtime.    . meloxicam (MOBIC) 7.5 MG tablet Take 1 tablet (7.5 mg total) by mouth daily. 30 tablet 11  . mometasone (NASONEX) 50 MCG/ACT nasal spray Place 2 sprays into the nose daily. 17 g 12  . montelukast (SINGULAIR) 10 MG tablet Take 1 tablet (10 mg total) by mouth at bedtime. 30 tablet 11  . Oxcarbazepine (TRILEPTAL) 300 MG tablet Take 1 tablet (300 mg total) by mouth at bedtime. 30 tablet 11  . pseudoephedrine (SUDAFED) 30 MG tablet Take 30 mg by mouth every 4 (four) hours as needed for congestion.    . SUMAtriptan (IMITREX) 50 MG tablet Take 1 tablet (50 mg total) by mouth once for 1 dose. May repeat ONCE in 2 hours if headache persists or recurs. 10 tablet 0   No current  facility-administered medications on file prior to visit.     Allergies:  No Known Allergies  Physical Exam General: well developed, well nourished middle-age Caucasian lady, seated, in no evident distress Head: head normocephalic and atraumatic.   Neck: supple with no carotid or supraclavicular bruits. Mild tightness of posterior neck muscles. Cardiovascular: regular rate and rhythm, no murmurs Musculoskeletal: no deformity Skin:  no rash/petichiae Vascular:  Normal pulses all extremities  Neurologic Exam Mental Status: Awake and fully alert. Oriented to place and time.  Recent and remote memory intact. Attention span, concentration and fund of knowledge appropriate. Mood and affect appropriate.  Cranial Nerves: Fundoscopic exam reveals sharp disc margins. Pupils equal, briskly reactive to light. Extraocular movements full without nystagmus. Visual fields full to confrontation. Hearing intact. Facial sensation intact. Face, tongue, palate moves normally and symmetrically.  Motor: Normal bulk and tone. Normal strength in all tested extremity muscles. Sensory.: intact to touch , pinprick , position and vibratory sensation.  Coordination: Rapid alternating movements normal in all extremities. Finger-to-nose and heel-to-shin performed accurately bilaterally. Gait and Station: Arises from chair without difficulty. Stance is normal. Gait demonstrates normal stride length and balance . Able to heel, toe and tandem walk without difficulty.  Reflexes: 1+ and symmetric. Toes downgoing.       ASSESSMENT: 42 year old Caucasian lady with long-term history of migraines with transformed migraines with analgesic rebound and chronic daily headaches.    PLAN: I had a long discussion with the patient regarding her refractory chronic daily headaches which likely address the present time from migraine headaches with muscle tension component and analgesic rebound. I encouraged her to discontinue taking  Tylenol and over-the-counter analgesics to diminished rebound headache. She was asked to limit taking Imitrex for not more than twice a week. Start Zanaflex 2 mg at night to help her headache prevention. We will send for her prior neurological records from her neurologist in [place]. Patient has clearly failed a trial of multiple prophylactic medications including Topamax, Depakote, Elavil and Botox and will benefit from referral to headache specialist Refer to Dr. Lucia Gaskins my partner who is a headache specialist to consider further  Anti CGRP therapies for headache prevention. No routine scheduled follow-up appointment with me is necessary. Greater than 50% time during this 45 minute consultation visit was spent on counseling and coordination of care about transformed migraine, analgesic rebound headaches and answering questions Delia Heady, MD  [facility] [address] [place], [place] [address]  Phone [phone] Fax [phone] Note: This document was prepared with digital dictation and possible smart phrase technology. Any transcriptional errors that result from this process are unintentional.

## 2017-10-12 NOTE — Patient Instructions (Signed)
I had a long discussion with the patient regarding her refractory chronic daily headaches which likely address the present time from migraine headaches with muscle tension component and canal Jessica rebound. I encouraged her to discontinue taking Tylenol and over-the-counter analgesics to diminished rebound headache. She was asked to limit taking Imitrex for not more than twice a week. Start Zanaflex 2 mg at night to help her headache prevention. There is sent for her prior neurological records from her neurologist in [place]. Refer to Dr. Lucia Gaskins my partner who is a headache specialist to consider further  Anti CGRP therapies for headache prevention. No routine scheduled follow-up appointment with me is necessary.   Analgesic Rebound Headache An analgesic rebound headache, sometimes called a medication overuse headache, is a headache that comes after pain medicine (analgesic) taken to treat the original (primary) headache has worn off. Any type of primary headache can return as a rebound headache if a person regularly takes analgesics more than three times a week to treat it. The types of primary headaches that are commonly associated with rebound headaches include:  Migraines.  Headaches that arise from tense muscles in the head and neck area (tension headaches).  Headaches that develop and happen again (recur) on one side of the head and around the eye (cluster headaches).  If rebound headaches continue, they become chronic daily headaches. What are the causes? This condition may be caused by frequent use of:  Over-the-counter medicines such as aspirin, ibuprofen, and acetaminophen.  Sinus relief medicines and other medicines that contain caffeine.  Narcotic pain medicines such as codeine and oxycodone.  What are the signs or symptoms? The symptoms of a rebound headache are the same as the symptoms of the original headache. Some of the symptoms of specific types of headaches  include: Migraine headache  Pulsing or throbbing pain on one or both sides of the head.  Severe pain that interferes with daily activities.  Pain that is worsened by physical activity.  Nausea, vomiting, or both.  Pain with exposure to bright light, loud noises, or strong smells.  General sensitivity to bright light, loud noises, or strong smells.  Visual changes.  Numbness of one or both arms. Tension headache  Pressure around the head.  Dull, aching head pain.  Pain felt over the front and sides of the head.  Tenderness in the muscles of the head, neck, and shoulders. Cluster headache  Severe pain that begins in or around one eye or temple.  Redness and tearing in the eye on the same side as the pain.  Droopy or swollen eyelid.  One-sided head pain.  Nausea.  Runny nose.  Sweaty, pale facial skin.  Restlessness. How is this diagnosed? This condition is diagnosed by:  Reviewing your medical history. This includes the nature of your primary headaches.  Reviewing the types of pain medicines that you have been using to treat your headaches and how often you take them.  How is this treated? This condition may be treated or managed by:  Discontinuing frequent use of the analgesic medicine. Doing this may worsen your headaches at first, but the pain should eventually become more manageable, less frequent, and less severe.  Seeing a headache specialist. He or she may be able to help you manage your headaches and help make sure there is not another cause of the headaches.  Using methods of stress relief, such as acupuncture, counseling, biofeedback, and massage. Talk with your health care provider about which methods might be good  for you.  Follow these instructions at home:  Take over-the-counter and prescription medicines only as told by your health care provider.  Stop the repeated use of pain medicine as told by your health care provider. Stopping can be  difficult. Carefully follow instructions from your health care provider.  Avoid triggers that are known to cause your primary headaches.  Keep all follow-up visits as told by your health care provider. This is important. Contact a health care provider if:  You continue to experience headaches after following treatments that your health care provider recommended. Get help right away if:  You develop new headache pain.  You develop headache pain that is different than what you have experienced in the past.  You develop numbness or tingling in your arms or legs.  You develop changes in your speech or vision. This information is not intended to replace advice given to you by your health care provider. Make sure you discuss any questions you have with your health care provider. Document Released: 06/05/2003 Document Revised: 10/03/2015 Document Reviewed: 08/18/2015 Elsevier Interactive Patient Education  [employer].

## 2017-10-12 NOTE — Telephone Encounter (Signed)
I mailed out release form for pt to sign and mail back to [facility]na.

## 2017-10-12 NOTE — Telephone Encounter (Signed)
Patient was seen today and needed to fill out a medical release form to have records sent to [place] from her previous office of care. Patient did not do this so I called the patient to inform her that she needs to come back to our office in the near future to do so. I informed patient of our office hours.

## 2017-10-19 ENCOUNTER — Ambulatory Visit (INDEPENDENT_AMBULATORY_CARE_PROVIDER_SITE_OTHER): Admitting: Orthopaedic Surgery

## 2017-10-27 ENCOUNTER — Other Ambulatory Visit: Payer: Self-pay | Admitting: Physician Assistant

## 2017-10-27 DIAGNOSIS — F339 Major depressive disorder, recurrent, unspecified: Secondary | ICD-10-CM

## 2017-10-28 ENCOUNTER — Telehealth: Payer: Self-pay | Admitting: Physician Assistant

## 2017-10-28 NOTE — Telephone Encounter (Signed)
Letter printed and put on [facility] desk to sign on Monday and fax. Pt is aware it needs to be signed on Monday and voiced understanding.

## 2017-11-02 ENCOUNTER — Encounter (INDEPENDENT_AMBULATORY_CARE_PROVIDER_SITE_OTHER): Payer: Self-pay | Admitting: Orthopaedic Surgery

## 2017-11-02 ENCOUNTER — Ambulatory Visit (INDEPENDENT_AMBULATORY_CARE_PROVIDER_SITE_OTHER)

## 2017-11-02 ENCOUNTER — Ambulatory Visit (INDEPENDENT_AMBULATORY_CARE_PROVIDER_SITE_OTHER): Admitting: Orthopaedic Surgery

## 2017-11-02 VITALS — BP 114/73 | HR 86 | Ht 64.0 in | Wt 160.0 lb

## 2017-11-02 DIAGNOSIS — G8929 Other chronic pain: Secondary | ICD-10-CM | POA: Diagnosis not present

## 2017-11-02 DIAGNOSIS — M5441 Lumbago with sciatica, right side: Secondary | ICD-10-CM | POA: Diagnosis not present

## 2017-11-02 MED ORDER — METHYLPREDNISOLONE ACETATE 40 MG/ML IJ SUSP
80.0000 mg | INTRAMUSCULAR | Status: AC | PRN
  Administered 2017-11-02: 80 mg

## 2017-11-02 MED ORDER — BUPIVACAINE HCL 0.5 % IJ SOLN
2.0000 mL | INTRAMUSCULAR | Status: AC | PRN
  Administered 2017-11-02: 2 mL via INTRA_ARTICULAR

## 2017-11-02 MED ORDER — LIDOCAINE HCL 1 % IJ SOLN
2.0000 mL | INTRAMUSCULAR | Status: AC | PRN
  Administered 2017-11-02: 2 mL

## 2017-11-02 NOTE — Progress Notes (Signed)
Office Visit Note   Patient: Kimberly Terrell. Kimberly Terrell           Date of Birth: 1975/09/01           MRN: [id number] Visit Date: 11/02/2017              Requested by: Remus Loffler, PA-C [id number] [place], [place] [address] PCP: Remus Loffler, PA-C   Assessment & Plan: Visit Diagnoses:  1. Chronic midline low back pain with right-sided sciatica     Plan: Kimberly Terrell is had chronic pain in the area of her lateral right hip.  Pain seems to originate in the lateral aspect of her hip and oftentimes radiate as far distally as her right knee and is 4 approximately as her right buttock.  No history of injury or trauma.  She has a long history of exotic dancing no history of injury or trauma.  Appears to originate along the greater trochanteric region.  Will inject this with cortisone and monitor her response.  Would also consider MRI of her pelvis and MRI scan of her lumbar spine if no response  Follow-Up Instructions: Return in about 1 month (around 12/03/2017).   Orders:  Orders Placed This Encounter  Procedures  . XR Lumbar Spine 2-3 Views   No orders of the defined types were placed in this encounter.     Procedures: Large Joint Inj: R greater trochanter on 11/02/2017 3:42 PM Indications: pain and diagnostic evaluation Details: 25 G 1.5 in needle  Arthrogram: No  Medications: 2 mL lidocaine 1 %; 2 mL bupivacaine 0.5 %; 80 mg methylPREDNISolone acetate 40 MG/ML Procedure, treatment alternatives, risks and benefits explained, specific risks discussed. Consent was given by the patient. Immediately prior to procedure a time out was called to verify the correct patient, procedure, equipment, support staff and site/side marked as required. Patient was prepped and draped in the usual sterile fashion.       Clinical Data: No additional findings.   Subjective: Chief Complaint  Patient presents with  . New Patient (Initial Visit)    BACK PAIN RADIATES DOWN TO R KNEE R HIP  FOR 1.5 YRS.  PCP TOLD HER SHE HAD ARTHIRITIS AND BURSITIS AND PAIN POSSIBLY COMING FROM SCIATIC   Kimberly Terrell relates a chronic history of somewhat diffuse lateral right hip pain.  No injury or trauma.  He notes that she was a she has difficulty sleeping at night trying to find a comfortable position.  When somewhat difficult for her to find a specific location of her pain it seems to be localized in the area of her lateral right hip.  She denies any specific back pain.  She has not had any bowel or bladder changes.  HPI  Review of Systems  Constitutional: Negative for fatigue and fever.  HENT: Negative for ear pain.   Eyes: Negative for pain.  Respiratory: Negative for cough and shortness of breath.   Cardiovascular: Negative for leg swelling.  Gastrointestinal: Positive for constipation. Negative for diarrhea.  Genitourinary: Negative for difficulty urinating.  Musculoskeletal: Positive for back pain. Negative for neck pain.  Skin: Negative for rash.  Allergic/Immunologic: Negative for food allergies.  Neurological: Positive for weakness. Negative for numbness.  Hematological: Bruises/bleeds easily.  Psychiatric/Behavioral: Positive for sleep disturbance.     Objective: Vital Signs: BP 114/73 (BP Location: Left Arm, Patient Position: Sitting, Cuff Size: Normal)   Pulse 86   Ht 5\' 4"  (1.626 m)   Wt 160 lb (  72.6 kg)   BMI 27.46 kg/m   Physical Exam  Constitutional: She is oriented to person, place, and time. She appears well-developed and well-nourished.  HENT:  Mouth/Throat: Oropharynx is clear and moist.  Eyes: Pupils are equal, round, and reactive to light. EOM are normal.  Pulmonary/Chest: Effort normal.  Neurological: She is alert and oriented to person, place, and time.  Skin: Skin is warm and dry.  Psychiatric: She has a normal mood and affect. Her behavior is normal.    Ortho Exam awake alert and oriented x3.  Comfortable sitting some areas of tenderness to palpation along the  greater trochanter and even proximally up the right hip.  No groin pain.  No pain with range of motion of right hip.  No specific pain referable to her right knee.  Knee was not effused.  No percussible back pain.  No pain over the sacroiliac joints.  Reflexes were symmetrical.  Straight leg raise negative.  Able to touch her toes without a problem and back extend without problem.  Specialty Comments:  No specialty comments available.  Imaging: Xr Lumbar Spine 2-3 Views  Result Date: 11/02/2017 Films of the lumbar spine obtained in 2 projections.  There is very minimal left-sided scoliosis between L4 and the sacrum.  Mild facet degenerative changes at L4-5 and L5-S1.  No listhesis.    PMFS History: Patient Active Problem List   Diagnosis Date Noted  . Chronic daily headache 10/12/2017  . Allergic rhinitis 07/25/2017  . Depression, recurrent (HCC) 04/04/2017  . Migraine 04/04/2017  . Bursitis of right hip 04/04/2017  . GAD (generalized anxiety disorder) 04/04/2017  . DUB (dysfunctional uterine bleeding) 04/04/2017   Past Medical History:  Diagnosis Date  . Anxiety   . Arthritis   . Asthma   . Depression   . Migraines     Family History  Problem Relation Age of Onset  . Depression Mother   . Depression Daughter     Past Surgical History:  Procedure Laterality Date  . COSMETIC SURGERY  2008   breast implants    Social History   Occupational History  . Occupation: [employer]  . Smoking status: Current Every Day Smoker    Packs/day: 1.00    Types: Cigarettes  . Smokeless tobacco: Never Used  Substance and Sexual Activity  . Alcohol use: No    Frequency: Never  . Drug use: No  . Sexual activity: Yes    Birth control/protection: IUD

## 2017-11-07 ENCOUNTER — Other Ambulatory Visit: Payer: Self-pay | Admitting: Physician Assistant

## 2017-11-07 DIAGNOSIS — F411 Generalized anxiety disorder: Secondary | ICD-10-CM

## 2017-11-07 NOTE — Telephone Encounter (Signed)
Last seen 10/04/17  [facility] 

## 2017-11-07 NOTE — Telephone Encounter (Signed)
Small qty provided until PCP returns.  High risk medication use w/ Norco.  The Narcotic Database has been reviewed.  There were no red flags.

## 2017-11-08 ENCOUNTER — Telehealth: Payer: Self-pay | Admitting: Physician Assistant

## 2017-11-08 NOTE — Telephone Encounter (Signed)
Patient gave me fax number and letter was faxed.

## 2017-11-11 ENCOUNTER — Other Ambulatory Visit: Payer: Self-pay | Admitting: Physician Assistant

## 2017-11-11 ENCOUNTER — Telehealth: Payer: Self-pay | Admitting: Physician Assistant

## 2017-11-11 DIAGNOSIS — F339 Major depressive disorder, recurrent, unspecified: Secondary | ICD-10-CM

## 2017-11-11 NOTE — Telephone Encounter (Signed)
Pt is scheduled to see Lawanna Kobus on 9/9 for med refill, Dr Nadine Counts sent in few xanax beginning of the week, pt states that she will be out and wants to know if we can send a refill on that and her lexapro until she can get in for her apt with [facility] [place]

## 2017-11-11 NOTE — Telephone Encounter (Signed)
Patient is aware that you are out of office. Please review and advise when you return

## 2017-11-13 ENCOUNTER — Other Ambulatory Visit: Payer: Self-pay | Admitting: Physician Assistant

## 2017-11-17 ENCOUNTER — Telehealth: Payer: Self-pay | Admitting: Physician Assistant

## 2017-11-17 NOTE — Telephone Encounter (Signed)
Pt requesting refill of Xanax Please advise

## 2017-11-17 NOTE — Telephone Encounter (Signed)
I did 21 as I was filling in for Dr. Nadine Counts until she gets back, so a refill can be requested next week

## 2017-11-21 ENCOUNTER — Telehealth: Payer: Self-pay | Admitting: Physician Assistant

## 2017-11-21 NOTE — Telephone Encounter (Signed)
According to Epic, patient is seen by Prudy Feeler as her PCP.  Appointment scheduled with Lawanna Kobus on 11/22/2017.

## 2017-11-22 ENCOUNTER — Ambulatory Visit (INDEPENDENT_AMBULATORY_CARE_PROVIDER_SITE_OTHER): Admitting: Physician Assistant

## 2017-11-22 ENCOUNTER — Encounter: Payer: Self-pay | Admitting: Physician Assistant

## 2017-11-22 DIAGNOSIS — F339 Major depressive disorder, recurrent, unspecified: Secondary | ICD-10-CM

## 2017-11-22 DIAGNOSIS — M7071 Other bursitis of hip, right hip: Secondary | ICD-10-CM

## 2017-11-22 DIAGNOSIS — N938 Other specified abnormal uterine and vaginal bleeding: Secondary | ICD-10-CM

## 2017-11-22 DIAGNOSIS — F411 Generalized anxiety disorder: Secondary | ICD-10-CM

## 2017-11-22 MED ORDER — ALPRAZOLAM 1 MG PO TABS: 1.0000 mg | ORAL_TABLET | Freq: Three times a day (TID) | ORAL | 5 refills | Status: DC

## 2017-11-22 MED ORDER — MEGESTROL ACETATE 40 MG PO TABS
ORAL_TABLET | ORAL | 11 refills | Status: DC
Start: 2017-11-22 — End: 2018-10-24

## 2017-11-22 MED ORDER — SUMATRIPTAN SUCCINATE 50 MG PO TABS: 50.0000 mg | ORAL_TABLET | Freq: Once | ORAL | 11 refills | Status: DC

## 2017-11-22 MED ORDER — HYDROCODONE-ACETAMINOPHEN 5-325 MG PO TABS: 1.0000 | ORAL_TABLET | Freq: Four times a day (QID) | ORAL | 0 refills | Status: DC | PRN

## 2017-11-22 MED ORDER — ESCITALOPRAM OXALATE 20 MG PO TABS: 30.0000 mg | ORAL_TABLET | Freq: Every day | ORAL | 11 refills | Status: DC

## 2017-11-22 MED ORDER — HYDROCODONE-ACETAMINOPHEN 5-325 MG PO TABS: 1.0000 | ORAL_TABLET | ORAL | 0 refills | Status: DC | PRN

## 2017-11-22 MED ORDER — BUPROPION HCL ER (XL) 150 MG PO TB24: 150.0000 mg | ORAL_TABLET | Freq: Every day | ORAL | 11 refills | Status: DC

## 2017-11-23 NOTE — Progress Notes (Addendum)
BP 125/79   Pulse 86   Temp 98 F (36.7 C) (Oral)   Ht 5\' 4"  (1.626 m)   Wt 160 lb 9.6 oz (72.8 kg)   BMI 27.57 kg/m    Subjective:    Patient ID: Kimberly Terrell, female    DOB: June 07, 1975, 42 y.o.   MRN: [id number]  HPI: Kimberly Terrell is a 42 y.o. female presenting on 11/22/2017 for Medical Management of Chronic Issues (3 month follow up)  This patient comes in for recheck on her medications and medical conditions.  She has ongoing bursitis of her hip.  She will occasionally have to use hydrocodone for the pain.  She notes that she has generalized anxiety and does need alprazolam.  She does have depression.  And she has had dysfunctional bleeding where she has to use medication at this time.  She notes that she also has migraines.  She is going to headache specialist to get a new medication approved.  At this time she reports that overall things are fairly stable.  She is not having any difficulties with her medications.   Past Medical History:  Diagnosis Date  . Anxiety   . Arthritis   . Asthma   . Depression   . Migraines    Relevant past medical, surgical, family and social history reviewed and updated as indicated. Interim medical history since our last visit reviewed. Allergies and medications reviewed and updated. DATA REVIEWED: CHART IN EPIC  Family History reviewed for pertinent findings.  Review of Systems  Allergies as of 11/22/2017   No Known Allergies     Medication List        Accurate as of 11/22/17 11:59 PM. Always use your most recent med list.          acetaminophen 500 MG tablet Commonly known as:  TYLENOL Take 500 mg by mouth every 6 (six) hours as needed.   ALPRAZolam 1 MG tablet Commonly known as:  XANAX Take 1 tablet (1 mg total) by mouth 3 (three) times daily.   buPROPion 150 MG 24 hr tablet Commonly known as:  WELLBUTRIN XL Take 1 tablet (150 mg total) by mouth daily.   cetirizine 10 MG tablet Commonly known as:   ZYRTEC Take 10 mg by mouth daily.   escitalopram 20 MG tablet Commonly known as:  LEXAPRO Take 1.5 tablets (30 mg total) by mouth daily.   GLUCOSAMINE CHONDR 1500 COMPLX PO Take by mouth.   HYDROcodone-acetaminophen 5-325 MG tablet Commonly known as:  NORCO/VICODIN Take 1 tablet by mouth every 4 (four) hours as needed for moderate pain.   HYDROcodone-acetaminophen 5-325 MG tablet Commonly known as:  NORCO/VICODIN Take 1 tablet by mouth every 6 (six) hours as needed for moderate pain.   HYDROcodone-acetaminophen 5-325 MG tablet Commonly known as:  NORCO/VICODIN Take 1 tablet by mouth every 6 (six) hours as needed for moderate pain.   levonorgestrel 20 MCG/24HR IUD Commonly known as:  MIRENA 1 each by Intrauterine route once.   megestrol 40 MG tablet Commonly known as:  MEGACE 1 tablet daily   Melatonin 10 MG Caps Take 20 mg by mouth at bedtime.   meloxicam 7.5 MG tablet Commonly known as:  MOBIC Take 1 tablet (7.5 mg total) by mouth daily.   montelukast 10 MG tablet Commonly known as:  SINGULAIR Take 1 tablet (10 mg total) by mouth at bedtime.   Oxcarbazepine 300 MG tablet Commonly known as:  TRILEPTAL Take 1 tablet (300 mg  total) by mouth at bedtime.   SUMAtriptan 50 MG tablet Commonly known as:  IMITREX Take 1 tablet (50 mg total) by mouth once for 1 dose. May repeat ONCE in 2 hours if headache persists or recurs.   tizanidine 2 MG capsule Commonly known as:  ZANAFLEX Take 1 capsule (2 mg total) by mouth at bedtime and may repeat dose one time if needed.          Objective:    BP 125/79   Pulse 86   Temp 98 F (36.7 C) (Oral)   Ht 5\' 4"  (1.626 m)   Wt 160 lb 9.6 oz (72.8 kg)   BMI 27.57 kg/m   No Known Allergies  Wt Readings from Last 3 Encounters:  11/22/17 160 lb 9.6 oz (72.8 kg)  11/02/17 160 lb (72.6 kg)  10/12/17 160 lb 3.2 oz (72.7 kg)    Physical Exam  Results for orders placed or performed in visit on 08/09/17  Rapid Strep Screen  ([facility] ONLY)  Result Value Ref Range   Strep Gp A Ag, IA W/Reflex Negative Negative  Culture, Group A Strep  Result Value Ref Range   Strep A Culture CANCELED       Assessment & Plan:   1. Bursitis of right hip, unspecified bursa - HYDROcodone-acetaminophen (NORCO/VICODIN) 5-325 MG tablet; Take 1 tablet by mouth every 4 (four) hours as needed for moderate pain.  Dispense: 60 tablet; Refill: 0 - HYDROcodone-acetaminophen (NORCO/VICODIN) 5-325 MG tablet; Take 1 tablet by mouth every 6 (six) hours as needed for moderate pain.  Dispense: 60 tablet; Refill: 0 - HYDROcodone-acetaminophen (NORCO/VICODIN) 5-325 MG tablet; Take 1 tablet by mouth every 6 (six) hours as needed for moderate pain.  Dispense: 60 tablet; Refill: 0  2. GAD (generalized anxiety disorder) - ALPRAZolam (XANAX) 1 MG tablet; Take 1 tablet (1 mg total) by mouth 3 (three) times daily.  Dispense: 90 tablet; Refill: 5  3. Depression, recurrent (HCC) - buPROPion (WELLBUTRIN XL) 150 MG 24 hr tablet; Take 1 tablet (150 mg total) by mouth daily.  Dispense: 30 tablet; Refill: 11 - escitalopram (LEXAPRO) 20 MG tablet; Take 1.5 tablets (30 mg total) by mouth daily.  Dispense: 45 tablet; Refill: 11  4. DUB (dysfunctional uterine bleeding) - megestrol (MEGACE) 40 MG tablet; 1 tablet daily  Dispense: 30 tablet; Refill: 11    Continue all other maintenance medications as listed above.  Follow up plan: Return in about 6 months (around 05/25/2018).  Educational handout given for survey  Remus Loffler PA-C [facility] [address]  [place], [place] [address] [phone]   11/23/2017, 12:44 PM

## 2017-12-05 ENCOUNTER — Ambulatory Visit: Admitting: Physician Assistant

## 2017-12-12 ENCOUNTER — Telehealth: Payer: Self-pay | Admitting: Physician Assistant

## 2017-12-12 ENCOUNTER — Encounter: Payer: Self-pay | Admitting: Physician Assistant

## 2017-12-12 ENCOUNTER — Ambulatory Visit (INDEPENDENT_AMBULATORY_CARE_PROVIDER_SITE_OTHER): Admitting: Physician Assistant

## 2017-12-12 VITALS — BP 121/79 | HR 87 | Temp 99.2°F | Ht 64.0 in | Wt 163.2 lb

## 2017-12-12 DIAGNOSIS — J209 Acute bronchitis, unspecified: Secondary | ICD-10-CM | POA: Diagnosis not present

## 2017-12-12 DIAGNOSIS — R05 Cough: Secondary | ICD-10-CM | POA: Diagnosis not present

## 2017-12-12 DIAGNOSIS — R059 Cough, unspecified: Secondary | ICD-10-CM

## 2017-12-12 MED ORDER — METHYLPREDNISOLONE ACETATE 80 MG/ML IJ SUSP
80.0000 mg | Freq: Once | INTRAMUSCULAR | Status: AC
  Administered 2017-12-12: 80 mg via INTRAMUSCULAR

## 2017-12-12 MED ORDER — HYDROCODONE-HOMATROPINE 5-1.5 MG/5ML PO SYRP: 5.0000 mL | ORAL_SOLUTION | Freq: Four times a day (QID) | ORAL | 0 refills | Status: DC | PRN

## 2017-12-12 MED ORDER — DOXYCYCLINE HYCLATE 100 MG PO TABS
100.0000 mg | ORAL_TABLET | Freq: Two times a day (BID) | ORAL | 0 refills | Status: DC
Start: 2017-12-12 — End: 2017-12-22

## 2017-12-12 MED ORDER — ALBUTEROL SULFATE HFA 108 (90 BASE) MCG/ACT IN AERS: 2.0000 | INHALATION_SPRAY | Freq: Four times a day (QID) | RESPIRATORY_TRACT | 0 refills | Status: DC | PRN

## 2017-12-12 MED ORDER — PSEUDOEPHEDRINE-CODEINE-GG 30-10-100 MG/5ML PO SOLN: 10.0000 mL | Freq: Four times a day (QID) | ORAL | 0 refills | Status: DC | PRN

## 2017-12-12 NOTE — Telephone Encounter (Signed)
Spoke with pharmacy and advised that they can just do the [facility] on the guaifenesin.

## 2017-12-12 NOTE — Progress Notes (Signed)
o

## 2017-12-13 NOTE — Progress Notes (Signed)
BP 121/79   Pulse 87   Temp 99.2 F (37.3 C) (Oral)   Ht 5\' 4"  (1.626 m)   Wt 163 lb 3.2 oz (74 kg)   BMI 28.01 kg/m    Subjective:    Patient ID: Kimberly Terrell, female    DOB: December 20, 1975, 42 y.o.   MRN: [id number]  HPI: Kimberly Terrell. Kimberly Terrell is a 42 y.o. female presenting on 12/12/2017 for Fever; Cough; Sinusitis; Nasal Congestion; and Sore Throat  Patient with several days of progressing upper respiratory and bronchial symptoms. Initially there was more upper respiratory congestion. This progressed to having significant cough that is productive throughout the day and severe at night. There is occasional wheezing after coughing. Sometimes there is slight dyspnea on exertion. It is productive mucus that is yellow in color. Denies any blood.   Past Medical History:  Diagnosis Date  . Anxiety   . Arthritis   . Asthma   . Depression   . Migraines    Relevant past medical, surgical, family and social history reviewed and updated as indicated. Interim medical history since our last visit reviewed. Allergies and medications reviewed and updated. DATA REVIEWED: CHART IN EPIC  Family History reviewed for pertinent findings.  Review of Systems  Constitutional: Positive for chills and fatigue. Negative for activity change and appetite change.  HENT: Positive for congestion, postnasal drip and sore throat.   Eyes: Negative.   Respiratory: Positive for cough and wheezing.   Cardiovascular: Negative.  Negative for chest pain, palpitations and leg swelling.  Gastrointestinal: Negative.   Genitourinary: Negative.   Musculoskeletal: Negative.   Skin: Negative.   Neurological: Positive for headaches.    Allergies as of 12/12/2017   No Known Allergies     Medication List        Accurate as of 12/12/17 11:59 PM. Always use your most recent med list.          acetaminophen 500 MG tablet Commonly known as:  TYLENOL Take 500 mg by mouth every 6 (six) hours as needed.     albuterol 108 (90 Base) MCG/ACT inhaler Commonly known as:  PROVENTIL HFA;VENTOLIN HFA Inhale 2 puffs into the lungs every 6 (six) hours as needed for wheezing or shortness of breath.   ALPRAZolam 1 MG tablet Commonly known as:  XANAX Take 1 tablet (1 mg total) by mouth 3 (three) times daily.   buPROPion 150 MG 24 hr tablet Commonly known as:  WELLBUTRIN XL Take 1 tablet (150 mg total) by mouth daily.   cetirizine 10 MG tablet Commonly known as:  ZYRTEC Take 10 mg by mouth daily.   doxycycline 100 MG tablet Commonly known as:  VIBRA-TABS Take 1 tablet (100 mg total) by mouth 2 (two) times daily. 1 po bid   escitalopram 20 MG tablet Commonly known as:  LEXAPRO Take 1.5 tablets (30 mg total) by mouth daily.   GLUCOSAMINE CHONDR 1500 COMPLX PO Take by mouth.   HYDROcodone-acetaminophen 5-325 MG tablet Commonly known as:  NORCO/VICODIN Take 1 tablet by mouth every 4 (four) hours as needed for moderate pain.   HYDROcodone-acetaminophen 5-325 MG tablet Commonly known as:  NORCO/VICODIN Take 1 tablet by mouth every 6 (six) hours as needed for moderate pain.   HYDROcodone-acetaminophen 5-325 MG tablet Commonly known as:  NORCO/VICODIN Take 1 tablet by mouth every 6 (six) hours as needed for moderate pain.   levonorgestrel 20 MCG/24HR IUD Commonly known as:  MIRENA 1 each by Intrauterine route once.  megestrol 40 MG tablet Commonly known as:  MEGACE 1 tablet daily   Melatonin 10 MG Caps Take 20 mg by mouth at bedtime.   meloxicam 7.5 MG tablet Commonly known as:  MOBIC Take 1 tablet (7.5 mg total) by mouth daily.   montelukast 10 MG tablet Commonly known as:  SINGULAIR Take 1 tablet (10 mg total) by mouth at bedtime.   Oxcarbazepine 300 MG tablet Commonly known as:  TRILEPTAL Take 1 tablet (300 mg total) by mouth at bedtime.   pseudoephedrine-codeine-guaifenesin 30-10-100 MG/5ML solution Commonly known as:  MYTUSSIN DAC Take 10 mLs by mouth 4 (four) times  daily as needed for cough.   SUMAtriptan 50 MG tablet Commonly known as:  IMITREX Take 1 tablet (50 mg total) by mouth once for 1 dose. May repeat ONCE in 2 hours if headache persists or recurs.   tizanidine 2 MG capsule Commonly known as:  ZANAFLEX Take 1 capsule (2 mg total) by mouth at bedtime and may repeat dose one time if needed.          Objective:    BP 121/79   Pulse 87   Temp 99.2 F (37.3 C) (Oral)   Ht 5\' 4"  (1.626 m)   Wt 163 lb 3.2 oz (74 kg)   BMI 28.01 kg/m   No Known Allergies  Wt Readings from Last 3 Encounters:  12/12/17 163 lb 3.2 oz (74 kg)  11/22/17 160 lb 9.6 oz (72.8 kg)  11/02/17 160 lb (72.6 kg)    Physical Exam  Constitutional: She is oriented to person, place, and time. She appears well-developed and well-nourished.  HENT:  Head: Normocephalic and atraumatic.  Right Ear: There is drainage and tenderness.  Left Ear: There is drainage and tenderness.  Nose: Mucosal edema and rhinorrhea present. Right sinus exhibits no maxillary sinus tenderness and no frontal sinus tenderness. Left sinus exhibits no maxillary sinus tenderness and no frontal sinus tenderness.  Mouth/Throat: Oropharyngeal exudate and posterior oropharyngeal erythema present.  Eyes: Pupils are equal, round, and reactive to light. Conjunctivae and EOM are normal.  Neck: Normal range of motion. Neck supple.  Cardiovascular: Normal rate, regular rhythm, normal heart sounds and intact distal pulses.  Pulmonary/Chest: Effort normal. She has wheezes in the right upper field and the left upper field.  Abdominal: Soft. Bowel sounds are normal.  Neurological: She is alert and oriented to person, place, and time. She has normal reflexes.  Skin: Skin is warm and dry. No rash noted.  Psychiatric: She has a normal mood and affect. Her behavior is normal. Judgment and thought content normal.    Results for orders placed or performed in visit on 08/09/17  Rapid Strep Screen ([facility] ONLY)    Result Value Ref Range   Strep Gp A Ag, IA W/Reflex Negative Negative  Culture, Group A Strep  Result Value Ref Range   Strep A Culture CANCELED       Assessment & Plan:   1. Cough - doxycycline (VIBRA-TABS) 100 MG tablet; Take 1 tablet (100 mg total) by mouth 2 (two) times daily. 1 po bid  Dispense: 20 tablet; Refill: 0 - albuterol (PROVENTIL HFA;VENTOLIN HFA) 108 (90 Base) MCG/ACT inhaler; Inhale 2 puffs into the lungs every 6 (six) hours as needed for wheezing or shortness of breath.  Dispense: 1 Inhaler; Refill: 0 - methylPREDNISolone acetate (DEPO-MEDROL) injection 80 mg - pseudoephedrine-codeine-guaifenesin (MYTUSSIN DAC) 30-10-100 MG/5ML solution; Take 10 mLs by mouth 4 (four) times daily as needed for cough.  Dispense: 240 mL; Refill: 0  2. Acute bronchitis, unspecified organism See above   Continue all other maintenance medications as listed above.  Follow up plan: Return if symptoms worsen or fail to improve.  Educational handout given for survey  Remus Loffler PA-C [facility] [address]  [place], [place] [address] [phone]   12/13/2017, 10:58 PM

## 2017-12-22 ENCOUNTER — Encounter: Payer: Self-pay | Admitting: Pediatrics

## 2017-12-22 ENCOUNTER — Ambulatory Visit (INDEPENDENT_AMBULATORY_CARE_PROVIDER_SITE_OTHER): Admitting: Pediatrics

## 2017-12-22 VITALS — BP 117/73 | HR 95 | Temp 98.9°F | Resp 20 | Ht 64.0 in | Wt 160.8 lb

## 2017-12-22 DIAGNOSIS — R059 Cough, unspecified: Secondary | ICD-10-CM

## 2017-12-22 DIAGNOSIS — R062 Wheezing: Secondary | ICD-10-CM | POA: Diagnosis not present

## 2017-12-22 DIAGNOSIS — R05 Cough: Secondary | ICD-10-CM

## 2017-12-22 MED ORDER — GUAIFENESIN-CODEINE 100-10 MG/5ML PO SOLN: 5.0000 mL | Freq: Three times a day (TID) | ORAL | 0 refills | Status: DC | PRN

## 2017-12-22 MED ORDER — PREDNISONE 10 MG PO TABS: 10.0000 mg | ORAL_TABLET | Freq: Every day | ORAL | 0 refills | Status: DC

## 2017-12-22 NOTE — Progress Notes (Signed)
  Subjective:   Patient ID: Kimberly Terrell. [place], female    DOB: [birth year], 42 y.o.   MRN: [id number] CC: Cough and Nasal Congestion  HPI: Kimberly Terrell. [place] is a 42 y.o. female   Ongoing cough and congestion for the past 2 to 3 weeks.  No fevers for past few days.  Treated with 10 days of doxycycline, Depo-Medrol injection, pseudoephedrine including cough medicine, albuterol 10 days ago.  Albuterol has been helping some.  Finished course of doxycycline yesterday.  Continues to cough throughout the day.  Has not been able to sleep.  Working on smoking cessation.  Relevant past medical, surgical, family and social history reviewed. Allergies and medications reviewed and updated.  ROS: Per HPI   Objective:    BP 117/73   Pulse 95   Temp 98.9 F (37.2 C) (Oral)   Resp 20   Ht 5\' 4"  (1.626 m)   Wt 160 lb 12.8 oz (72.9 kg)   SpO2 96%   BMI 27.60 kg/m   Wt Readings from Last 3 Encounters:  12/22/17 160 lb 12.8 oz (72.9 kg)  12/12/17 163 lb 3.2 oz (74 kg)  11/22/17 160 lb 9.6 oz (72.8 kg)    Gen: NAD, alert, cooperative with exam, NCAT, dry cough present. EYES: EOMI, no conjunctival injection, or no icterus ENT:  TMs pearly gray b/l, OP without erythema LYMPH: no cervical LAD CV: NRRR, normal S1/S2, no murmur, distal pulses 2+ b/l Resp: Wheezing with forced exhalation, no rales, comfortable WOB Ext: No edema, warm Neuro: Alert and oriented  Assessment & Plan:  Jyl was seen today for cough and nasal congestion.  Diagnoses and all orders for this visit:  Wheezing Will do steroid taper, continue albuterol.  Symbicort 80 two puffs twice a day for next 2 weeks, will given. -     predniSONE (DELTASONE) 10 MG tablet; Take 1 tablet (10 mg total) by mouth daily with breakfast. Take 4 tabs x 3 days, then 3, 2,  1 tab each for 3 days  Cough Use below as needed for cough suppression, can cause drowsiness.  Do not take and drive. -     guaiFENesin-codeine 100-10 MG/5ML syrup; Take  5-10 mLs by mouth 3 (three) times daily as needed for cough.   Follow up plan: As needed, any worsening symptoms, new fevers should be seen. Rex Kras, MD [facility]

## 2017-12-22 NOTE — Patient Instructions (Addendum)
Keep lozenges, fluids with you to help avoid coughing.   Symbicort two puffs twice a day next couple of weeks.  Albuterol every 4 hours as needed  Prednisone taper sent in  Cough medicine sent in, can cause drowsiness, avoid taking and driving.

## 2018-03-08 ENCOUNTER — Telehealth: Payer: Self-pay | Admitting: Physician Assistant

## 2018-03-08 NOTE — Telephone Encounter (Signed)
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 Offered appt and pt declined. Advised she should be seen especially if the headaches get worse or she starts to become SOB. Pt states she would call back for appt.

## 2018-04-12 ENCOUNTER — Other Ambulatory Visit: Payer: Self-pay | Admitting: *Deleted

## 2018-04-12 DIAGNOSIS — F339 Major depressive disorder, recurrent, unspecified: Secondary | ICD-10-CM

## 2018-04-12 MED ORDER — ESCITALOPRAM OXALATE 10 MG PO TABS: 10.0000 mg | ORAL_TABLET | Freq: Every day | ORAL | 2 refills | Status: DC

## 2018-04-12 MED ORDER — ESCITALOPRAM OXALATE 20 MG PO TABS: 20.0000 mg | ORAL_TABLET | Freq: Every day | ORAL | 11 refills | Status: DC

## 2018-04-12 NOTE — Progress Notes (Signed)
Pharmacy states patient will need a 20mg  and a 10mg  of lexapro. Rx sent to pharmacy.

## 2018-04-17 ENCOUNTER — Ambulatory Visit (INDEPENDENT_AMBULATORY_CARE_PROVIDER_SITE_OTHER)

## 2018-04-17 ENCOUNTER — Encounter: Payer: Self-pay | Admitting: Physician Assistant

## 2018-04-17 ENCOUNTER — Telehealth: Payer: Self-pay | Admitting: Physician Assistant

## 2018-04-17 ENCOUNTER — Ambulatory Visit (INDEPENDENT_AMBULATORY_CARE_PROVIDER_SITE_OTHER): Admitting: Physician Assistant

## 2018-04-17 VITALS — BP 127/82 | HR 96 | Temp 97.4°F | Ht 64.0 in | Wt 168.4 lb

## 2018-04-17 DIAGNOSIS — S4991XA Unspecified injury of right shoulder and upper arm, initial encounter: Secondary | ICD-10-CM | POA: Diagnosis not present

## 2018-04-17 DIAGNOSIS — M25531 Pain in right wrist: Secondary | ICD-10-CM

## 2018-04-17 DIAGNOSIS — M25511 Pain in right shoulder: Secondary | ICD-10-CM | POA: Diagnosis not present

## 2018-04-17 DIAGNOSIS — F411 Generalized anxiety disorder: Secondary | ICD-10-CM

## 2018-04-17 DIAGNOSIS — W19XXXA Unspecified fall, initial encounter: Secondary | ICD-10-CM

## 2018-04-17 DIAGNOSIS — M7071 Other bursitis of hip, right hip: Secondary | ICD-10-CM | POA: Diagnosis not present

## 2018-04-17 MED ORDER — HYDROCODONE-ACETAMINOPHEN 5-325 MG PO TABS: 1.0000 | ORAL_TABLET | ORAL | 0 refills | Status: DC | PRN

## 2018-04-17 MED ORDER — HYDROCODONE-ACETAMINOPHEN 5-325 MG PO TABS: 1.0000 | ORAL_TABLET | Freq: Four times a day (QID) | ORAL | 0 refills | Status: DC | PRN

## 2018-04-17 MED ORDER — PREDNISONE 10 MG (48) PO TBPK: ORAL_TABLET | ORAL | 0 refills | Status: DC

## 2018-04-17 MED ORDER — KETOROLAC TROMETHAMINE 60 MG/2ML IM SOLN
60.0000 mg | Freq: Once | INTRAMUSCULAR | Status: AC
  Administered 2018-04-17: 60 mg via INTRAMUSCULAR

## 2018-04-17 MED ORDER — CYCLOBENZAPRINE HCL 10 MG PO TABS: 10.0000 mg | ORAL_TABLET | Freq: Every day | ORAL | 2 refills | Status: DC

## 2018-04-17 MED ORDER — ALPRAZOLAM 1 MG PO TABS: 1.0000 mg | ORAL_TABLET | Freq: Three times a day (TID) | ORAL | 5 refills | Status: DC

## 2018-04-17 NOTE — Telephone Encounter (Signed)
Patient aware of results.

## 2018-04-20 NOTE — Progress Notes (Signed)
BP 127/82   Pulse 96   Temp (!) 97.4 F (36.3 C) (Oral)   Ht 5\' 4"  (1.626 m)   Wt 168 lb 6.4 oz (76.4 kg)   BMI 28.91 kg/m    Subjective:    Patient ID: Kimberly Terrell, female    DOB: Dec 05, 1975, 43 y.o.   MRN: [id number]  HPI: Kimberly Terrell. Rak is a 43 y.o. female presenting on 04/17/2018 for Shoulder Pain (right. Larey Seat going up the stairs ) and Wrist Pain  One day ago, she fell reaching forward and felt her shoulder immediately have pain. There was no pop or immediate laxity. She has continued to have more pain and swelling in the joint. She tried OTC and ice.  Past Medical History:  Diagnosis Date  . Anxiety   . Arthritis   . Asthma   . Depression   . Migraines    Relevant past medical, surgical, family and social history reviewed and updated as indicated. Interim medical history since our last visit reviewed. Allergies and medications reviewed and updated. DATA REVIEWED: CHART IN EPIC  Family History reviewed for pertinent findings.  Review of Systems  Constitutional: Negative.   HENT: Negative.   Eyes: Negative.   Respiratory: Negative.   Gastrointestinal: Negative.   Genitourinary: Negative.   Musculoskeletal: Positive for arthralgias and joint swelling.    Allergies as of 04/17/2018   No Known Allergies     Medication List       Accurate as of April 17, 2018 11:59 PM. Always use your most recent med list.        acetaminophen 500 MG tablet Commonly known as:  TYLENOL Take 500 mg by mouth every 6 (six) hours as needed.   albuterol 108 (90 Base) MCG/ACT inhaler Commonly known as:  PROVENTIL HFA;VENTOLIN HFA Inhale 2 puffs into the lungs every 6 (six) hours as needed for wheezing or shortness of breath.   ALPRAZolam 1 MG tablet Commonly known as:  XANAX Take 1 tablet (1 mg total) by mouth 3 (three) times daily.   buPROPion 150 MG 24 hr tablet Commonly known as:  WELLBUTRIN XL Take 1 tablet (150 mg total) by mouth daily.   cetirizine 10 MG  tablet Commonly known as:  ZYRTEC Take 10 mg by mouth daily.   cyclobenzaprine 10 MG tablet Commonly known as:  FLEXERIL Take 1 tablet (10 mg total) by mouth at bedtime.   escitalopram 10 MG tablet Commonly known as:  LEXAPRO Take 1 tablet (10 mg total) by mouth daily.   escitalopram 20 MG tablet Commonly known as:  LEXAPRO Take 1 tablet (20 mg total) by mouth daily. Take along with a 10mg    GLUCOSAMINE CHONDR 1500 COMPLX PO Take by mouth.   guaiFENesin-codeine 100-10 MG/5ML syrup Take 5-10 mLs by mouth 3 (three) times daily as needed for cough.   HYDROcodone-acetaminophen 5-325 MG tablet Commonly known as:  NORCO/VICODIN Take 1 tablet by mouth every 4 (four) hours as needed for moderate pain.   HYDROcodone-acetaminophen 5-325 MG tablet Commonly known as:  NORCO/VICODIN Take 1 tablet by mouth every 6 (six) hours as needed for moderate pain.   HYDROcodone-acetaminophen 5-325 MG tablet Commonly known as:  NORCO/VICODIN Take 1 tablet by mouth every 6 (six) hours as needed for moderate pain.   levonorgestrel 20 MCG/24HR IUD Commonly known as:  MIRENA 1 each by Intrauterine route once.   megestrol 40 MG tablet Commonly known as:  MEGACE 1 tablet daily   Melatonin 10 MG Caps  Take 20 mg by mouth at bedtime.   montelukast 10 MG tablet Commonly known as:  SINGULAIR Take 1 tablet (10 mg total) by mouth at bedtime.   Oxcarbazepine 300 MG tablet Commonly known as:  TRILEPTAL Take 1 tablet (300 mg total) by mouth at bedtime.   predniSONE 10 MG (48) Tbpk tablet Commonly known as:  STERAPRED UNI-PAK 48 TAB Take as directed for 12 days   pseudoephedrine-codeine-guaifenesin 30-10-100 MG/5ML solution Commonly known as:  MYTUSSIN DAC Take 10 mLs by mouth 4 (four) times daily as needed for cough.   SUMAtriptan 50 MG tablet Commonly known as:  IMITREX Take 1 tablet (50 mg total) by mouth once for 1 dose. May repeat ONCE in 2 hours if headache persists or recurs.     tizanidine 2 MG capsule Commonly known as:  ZANAFLEX Take 1 capsule (2 mg total) by mouth at bedtime and may repeat dose one time if needed.          Objective:    BP 127/82   Pulse 96   Temp (!) 97.4 F (36.3 C) (Oral)   Ht 5\' 4"  (1.626 m)   Wt 168 lb 6.4 oz (76.4 kg)   BMI 28.91 kg/m   No Known Allergies  Wt Readings from Last 3 Encounters:  04/17/18 168 lb 6.4 oz (76.4 kg)  12/22/17 160 lb 12.8 oz (72.9 kg)  12/12/17 163 lb 3.2 oz (74 kg)    Physical Exam Constitutional:      Appearance: She is well-developed.  HENT:     Head: Normocephalic and atraumatic.  Eyes:     Conjunctiva/sclera: Conjunctivae normal.     Pupils: Pupils are equal, round, and reactive to light.  Cardiovascular:     Rate and Rhythm: Normal rate and regular rhythm.     Heart sounds: Normal heart sounds.  Pulmonary:     Effort: Pulmonary effort is normal.     Breath sounds: Normal breath sounds.  Abdominal:     General: Bowel sounds are normal.     Palpations: Abdomen is soft.  Musculoskeletal:     Right shoulder: She exhibits decreased range of motion, tenderness and spasm.  Skin:    General: Skin is warm and dry.     Findings: No rash.  Neurological:     Mental Status: She is alert and oriented to person, place, and time.     Deep Tendon Reflexes: Reflexes are normal and symmetric.  Psychiatric:        Behavior: Behavior normal.        Thought Content: Thought content normal.        Judgment: Judgment normal.         Assessment & Plan:   1. Fall, initial encounter - DG Shoulder Right; Future - DG Wrist Complete Right; Future - ketorolac (TORADOL) injection 60 mg  2. Right shoulder injury, initial encounter - cyclobenzaprine (FLEXERIL) 10 MG tablet; Take 1 tablet (10 mg total) by mouth at bedtime.  Dispense: 30 tablet; Refill: 2 - predniSONE (STERAPRED UNI-PAK 48 TAB) 10 MG (48) TBPK tablet; Take as directed for 12 days  Dispense: 48 tablet; Refill: 0 - ketorolac (TORADOL)  injection 60 mg  3. Bursitis of right hip, unspecified bursa - HYDROcodone-acetaminophen (NORCO/VICODIN) 5-325 MG tablet; Take 1 tablet by mouth every 4 (four) hours as needed for moderate pain.  Dispense: 60 tablet; Refill: 0 - HYDROcodone-acetaminophen (NORCO/VICODIN) 5-325 MG tablet; Take 1 tablet by mouth every 6 (six) hours as needed for moderate  pain.  Dispense: 60 tablet; Refill: 0 - HYDROcodone-acetaminophen (NORCO/VICODIN) 5-325 MG tablet; Take 1 tablet by mouth every 6 (six) hours as needed for moderate pain.  Dispense: 60 tablet; Refill: 0  4. GAD (generalized anxiety disorder) - ALPRAZolam (XANAX) 1 MG tablet; Take 1 tablet (1 mg total) by mouth 3 (three) times daily.  Dispense: 90 tablet; Refill: 5   Continue all other maintenance medications as listed above.  Follow up plan: Return in about 3 weeks (around 05/08/2018).  Educational handout given for survey  Remus Loffler PA-C [facility] [address]  [place], [place] [address] [phone]   04/20/2018, 7:22 PM

## 2018-05-08 ENCOUNTER — Other Ambulatory Visit: Payer: Self-pay | Admitting: Physician Assistant

## 2018-05-08 DIAGNOSIS — F339 Major depressive disorder, recurrent, unspecified: Secondary | ICD-10-CM

## 2018-06-20 ENCOUNTER — Telehealth (INDEPENDENT_AMBULATORY_CARE_PROVIDER_SITE_OTHER): Admitting: Family Medicine

## 2018-06-20 ENCOUNTER — Other Ambulatory Visit: Payer: Self-pay

## 2018-06-20 DIAGNOSIS — J069 Acute upper respiratory infection, unspecified: Secondary | ICD-10-CM | POA: Diagnosis not present

## 2018-06-20 MED ORDER — FLUTICASONE PROPIONATE 50 MCG/ACT NA SUSP: 2.0000 | Freq: Every day | NASAL | 6 refills | Status: DC

## 2018-06-20 MED ORDER — PSEUDOEPH-BROMPHEN-DM 30-2-10 MG/5ML PO SYRP: 5.0000 mL | ORAL_SOLUTION | Freq: Four times a day (QID) | ORAL | 0 refills | Status: DC | PRN

## 2018-06-20 MED ORDER — PREDNISONE 20 MG PO TABS: ORAL_TABLET | ORAL | 0 refills | Status: DC

## 2018-06-20 NOTE — Patient Instructions (Signed)
Symptomatic care discussed.  Increase water intake.  Report any new or worsening symptoms.

## 2018-06-20 NOTE — Progress Notes (Signed)
Virtual Visit via telephone Note  I connected with Kimberly Terrell on 06/20/18 at 1335 by telephone and verified that I am speaking with the correct person using two identifiers. Kimberly Terrell is currently located at home and no one is currently with them during visit. The provider, Kari Baars, FNP is located in their office at time of visit.  I discussed the limitations, risks, security and privacy concerns of performing an evaluation and management service by telephone and the availability of in person appointments. I also discussed with the patient that there may be a patient responsible charge related to this service. The patient expressed understanding and agreed to proceed.  Subjective:  Patient ID: Kimberly Terrell, female    DOB: 1975/10/22, 42 y.o.   MRN: [id number]  Chief Complaint:  Cough, congestion, rhinorrhea, scratchy throat  HPI: Kimberly Terrell is a 43 y.o. female presenting on 06/20/2018 for cough, congestion, rhinorrhea, scratchy throat.   Pt states she is having upper respiratory symptoms. States she has a cough, congestion, rhinorrhea, and a scratchy throat. She states she has been trying over the counter Vicks nasal spray and taking her Zyrtec without relief of symptoms. She denies fever, chills, fatigue, or myalgias. No shortness of breath or chest pain. States she has not traveled or been exposed to anyone sick. She states she has a headache and dry mouth with the symptoms.    Relevant past medical, surgical, family, and social history reviewed and updated as indicated.  Allergies and medications reviewed and updated.   Past Medical History:  Diagnosis Date  . Anxiety   . Arthritis   . Asthma   . Depression   . Migraines     Past Surgical History:  Procedure Laterality Date  . COSMETIC SURGERY  2008   breast implants     Social History   Socioeconomic History  . Marital status: Married    Spouse name: Rozell Sammet  . Number of children: 1   . Years of education: Not on file  . Highest education level: Not on file  Occupational History  . Occupation: [employer]  Social Needs  . Financial resource strain: Not on file  . Food insecurity:    Worry: Not on file    Inability: Not on file  . Transportation needs:    Medical: Not on file    Non-medical: Not on file  Tobacco Use  . Smoking status: Current Every Day Smoker    Packs/day: 1.00    Types: Cigarettes  . Smokeless tobacco: Never Used  Substance and Sexual Activity  . Alcohol use: No    Frequency: Never  . Drug use: No  . Sexual activity: Yes    Birth control/protection: I.U.D.  Lifestyle  . Physical activity:    Days per week: Not on file    Minutes per session: Not on file  . Stress: Not on file  Relationships  . Social connections:    Talks on phone: Not on file    Gets together: Not on file    Attends religious service: Not on file    Active member of club or organization: Not on file    Attends meetings of clubs or organizations: Not on file    Relationship status: Not on file  . Intimate partner violence:    Fear of current or ex partner: Not on file    Emotionally abused: Not on file    Physically abused: Not on file  Forced sexual activity: Not on file  Other Topics Concern  . Not on file  Social History Narrative   Right handed    Lives with Webster Cowdery   Caffeine use: coffee-daily    Outpatient Encounter Medications as of 06/20/2018  Medication Sig  . acetaminophen (TYLENOL) 500 MG tablet Take 500 mg by mouth every 6 (six) hours as needed.  Marland Kitchen albuterol (PROVENTIL HFA;VENTOLIN HFA) 108 (90 Base) MCG/ACT inhaler Inhale 2 puffs into the lungs every 6 (six) hours as needed for wheezing or shortness of breath.  . ALPRAZolam (XANAX) 1 MG tablet Take 1 tablet (1 mg total) by mouth 3 (three) times daily.  . brompheniramine-pseudoephedrine-DM 30-2-10 MG/5ML syrup Take 5 mLs by mouth 4 (four) times daily as needed.  Marland Kitchen buPROPion (WELLBUTRIN  XL) 150 MG 24 hr tablet Take 1 tablet (150 mg total) by mouth daily.  . cetirizine (ZYRTEC) 10 MG tablet Take 10 mg by mouth daily.  . cyclobenzaprine (FLEXERIL) 10 MG tablet Take 1 tablet (10 mg total) by mouth at bedtime.  Marland Kitchen escitalopram (LEXAPRO) 10 MG tablet Take 1 tablet (10 mg total) by mouth daily.  Marland Kitchen escitalopram (LEXAPRO) 20 MG tablet Take 1 tablet (20 mg total) by mouth daily. Take along with a 10mg   . fluticasone (FLONASE) 50 MCG/ACT nasal spray Place 2 sprays into both nostrils daily.  . Glucosamine-Chondroit-Vit C-Mn (GLUCOSAMINE CHONDR 1500 COMPLX PO) Take by mouth.  Marland Kitchen guaiFENesin-codeine 100-10 MG/5ML syrup Take 5-10 mLs by mouth 3 (three) times daily as needed for cough.  Marland Kitchen HYDROcodone-acetaminophen (NORCO/VICODIN) 5-325 MG tablet Take 1 tablet by mouth every 4 (four) hours as needed for moderate pain.  Marland Kitchen HYDROcodone-acetaminophen (NORCO/VICODIN) 5-325 MG tablet Take 1 tablet by mouth every 6 (six) hours as needed for moderate pain.  Marland Kitchen HYDROcodone-acetaminophen (NORCO/VICODIN) 5-325 MG tablet Take 1 tablet by mouth every 6 (six) hours as needed for moderate pain.  Marland Kitchen levonorgestrel (MIRENA) 20 MCG/24HR IUD 1 each by Intrauterine route once.  . megestrol (MEGACE) 40 MG tablet 1 tablet daily  . Melatonin 10 MG CAPS Take 20 mg by mouth at bedtime.  . montelukast (SINGULAIR) 10 MG tablet Take 1 tablet (10 mg total) by mouth at bedtime.  . Oxcarbazepine (TRILEPTAL) 300 MG tablet TAKE 1 TABLET BY MOUTH ONCE DAILY AT BEDTIME  . predniSONE (DELTASONE) 20 MG tablet 2 po at sametime daily for 5 days  . pseudoephedrine-codeine-guaifenesin (MYTUSSIN DAC) 30-10-100 MG/5ML solution Take 10 mLs by mouth 4 (four) times daily as needed for cough.  . SUMAtriptan (IMITREX) 50 MG tablet Take 1 tablet (50 mg total) by mouth once for 1 dose. May repeat ONCE in 2 hours if headache persists or recurs.  . tizanidine (ZANAFLEX) 2 MG capsule Take 1 capsule (2 mg total) by mouth at bedtime and may repeat dose  one time if needed.  . [DISCONTINUED] predniSONE (STERAPRED UNI-PAK 48 TAB) 10 MG (48) TBPK tablet Take as directed for 12 days   No facility-administered encounter medications on file as of 06/20/2018.     No Known Allergies  Review of Systems  Constitutional: Negative for activity change, appetite change, chills, fatigue, fever and unexpected weight change.  HENT: Positive for congestion, postnasal drip, rhinorrhea, sneezing and sore throat. Negative for sinus pressure, sinus pain and tinnitus.   Eyes: Negative for pain, discharge, redness and itching.  Respiratory: Positive for cough and wheezing. Negative for chest tightness and shortness of breath.   Cardiovascular: Negative for chest pain, palpitations and leg swelling.  Gastrointestinal: Negative for abdominal pain.  Neurological: Positive for headaches. Negative for dizziness, weakness and light-headedness.  Psychiatric/Behavioral: Negative for confusion.  All other systems reviewed and are negative.        Observations/Objective: Pt alert and oriented. Pt answers all questions appropriately. Pt able to speak in full sentences.   Assessment and Plan: Diagnoses and all orders for this visit:  URI with cough and congestion Medications as prescribed. Symptomatic care discussed. Report any new or worsening symptoms.  -     brompheniramine-pseudoephedrine-DM 30-2-10 MG/5ML syrup; Take 5 mLs by mouth 4 (four) times daily as needed. -     predniSONE (DELTASONE) 20 MG tablet; 2 po at sametime daily for 5 days -     fluticasone (FLONASE) 50 MCG/ACT nasal spray; Place 2 sprays into both nostrils daily.     Follow Up Instructions: Report any new or worsening symptoms.     I discussed the assessment and treatment plan with the patient. The patient was provided an opportunity to ask questions and all were answered. The patient agreed with the plan and demonstrated an understanding of the instructions.   The patient was advised  to call back or seek an in-person evaluation if the symptoms worsen or if the condition fails to improve as anticipated.  The above assessment and management plan was discussed with the patient. The patient verbalized understanding of and has agreed to the management plan. Patient is aware to call the clinic if symptoms persist or worsen. Patient is aware when to return to the clinic for a follow-up visit. Patient educated on when it is appropriate to go to the emergency department.    I provided 15 minutes of non-face-to-face time during this encounter. The call started at 1335. The call ended at 1350.   Kari Baars, FNP-C [facility] [address] [place], [place] [address] [phone]

## 2018-06-27 ENCOUNTER — Ambulatory Visit (INDEPENDENT_AMBULATORY_CARE_PROVIDER_SITE_OTHER): Admitting: Nurse Practitioner

## 2018-06-27 ENCOUNTER — Other Ambulatory Visit: Payer: Self-pay

## 2018-06-27 ENCOUNTER — Encounter: Payer: Self-pay | Admitting: Nurse Practitioner

## 2018-06-27 DIAGNOSIS — M25511 Pain in right shoulder: Secondary | ICD-10-CM

## 2018-06-27 DIAGNOSIS — M25512 Pain in left shoulder: Secondary | ICD-10-CM | POA: Diagnosis not present

## 2018-06-27 MED ORDER — CYCLOBENZAPRINE HCL 10 MG PO TABS: 10.0000 mg | ORAL_TABLET | Freq: Three times a day (TID) | ORAL | 1 refills | Status: DC | PRN

## 2018-06-27 MED ORDER — DICLOFENAC SODIUM 1 % TD GEL: 2.0000 g | Freq: Four times a day (QID) | TRANSDERMAL | 1 refills | Status: AC

## 2018-06-27 NOTE — Progress Notes (Signed)
Patient ID: Kimberly Guin. Terrell, female   DOB: 1975-03-31, 43 y.o.   MRN: [id number]    Virtual Visit via telephone Note  I connected with Kimberly Terrell on 06/27/18 at 3:00 PM by telephone and verified that I am speaking with the correct person using two identifiers. Kimberly Terrell is currently located at home and no one is currently with her during visit. The provider, Mary-Margaret Daphine Deutscher, FNP is located in their office at time of visit.  I discussed the limitations, risks, security and privacy concerns of performing an evaluation and management service by telephone and the availability of in person appointments. I also discussed with the patient that there may be a patient responsible charge related to this service. The patient expressed understanding and agreed to proceed.   History and Present Illness:   Chief Complaint: bil shoulder pain  HPI Patient calls in c/o bil shoulder pain. Hurts to raise either arm up. She is not sure of any injury. She works at [employer] and does a lot of heavy lifting. This started a couple of days ago. She just got off steroids Sunday for allergic reaction. Keeping them still decreases pain and movement increases pain. Right shoulder worse then left.  She has pain meds that she gets from [facility].   Review of Systems  Constitutional: Negative.   HENT: Negative.   Respiratory: Negative.   Cardiovascular: Negative.   Musculoskeletal:       Bil shoulder pain  Skin: Negative.   Neurological: Negative for tingling and weakness.  Psychiatric/Behavioral: Negative.   All other systems reviewed and are negative.     Observations/Objective: Alert and oriented- answers all questions appropriately  Assessment and Plan: Kimberly Terrell calls in today with chief complaint of bil acute shoulder pain  1. Acute pain of both shoulders Rest Moist heat - diclofenac sodium (VOLTAREN) 1 % GEL; Apply 2 g topically 4 (four) times daily.  Dispense: 300  g; Refill: 1 - cyclobenzaprine (FLEXERIL) 10 MG tablet; Take 1 tablet (10 mg total) by mouth 3 (three) times daily as needed for muscle spasms.  Dispense: 30 tablet; Refill: 1    Follow Up Instructions: prn    I discussed the assessment and treatment plan with the patient. The patient was provided an opportunity to ask questions and all were answered. The patient agreed with the plan and demonstrated an understanding of the instructions.   The patient was advised to call back or seek an in-person evaluation if the symptoms worsen or if the condition fails to improve as anticipated.  The above assessment and management plan was discussed with the patient. The patient verbalized understanding of and has agreed to the management plan. Patient is aware to call the clinic if symptoms persist or worsen. Patient is aware when to return to the clinic for a follow-up visit. Patient educated on when it is appropriate to go to the emergency department.    I provided 8 minutes of non-face-to-face time during this encounter.    Mary-Margaret Daphine Deutscher, FNP

## 2018-07-04 ENCOUNTER — Other Ambulatory Visit: Payer: Self-pay | Admitting: Physician Assistant

## 2018-07-04 DIAGNOSIS — F339 Major depressive disorder, recurrent, unspecified: Secondary | ICD-10-CM

## 2018-07-17 ENCOUNTER — Other Ambulatory Visit: Payer: Self-pay | Admitting: Physician Assistant

## 2018-07-17 DIAGNOSIS — J309 Allergic rhinitis, unspecified: Secondary | ICD-10-CM

## 2018-08-09 ENCOUNTER — Telehealth: Payer: Self-pay | Admitting: Physician Assistant

## 2018-08-09 DIAGNOSIS — F339 Major depressive disorder, recurrent, unspecified: Secondary | ICD-10-CM

## 2018-08-09 MED ORDER — OXCARBAZEPINE 300 MG PO TABS: 300.0000 mg | ORAL_TABLET | Freq: Every day | ORAL | 0 refills | Status: DC

## 2018-08-09 NOTE — Telephone Encounter (Signed)
Patient aware that rx sent to pharmacy. 

## 2018-08-25 ENCOUNTER — Telehealth: Payer: Self-pay | Admitting: Physician Assistant

## 2018-08-25 ENCOUNTER — Telehealth: Admitting: Physician Assistant

## 2018-08-25 NOTE — Telephone Encounter (Signed)
Patient has been having vomiting, diarrhea and nausea since Saturday. Patient is on her way to urgent care.

## 2018-08-25 NOTE — Telephone Encounter (Signed)
Please see additional telephone encounter.

## 2018-09-13 ENCOUNTER — Other Ambulatory Visit: Payer: Self-pay | Admitting: Physician Assistant

## 2018-09-13 DIAGNOSIS — F339 Major depressive disorder, recurrent, unspecified: Secondary | ICD-10-CM

## 2018-09-25 ENCOUNTER — Other Ambulatory Visit: Payer: Self-pay

## 2018-09-25 ENCOUNTER — Ambulatory Visit (INDEPENDENT_AMBULATORY_CARE_PROVIDER_SITE_OTHER): Admitting: Physician Assistant

## 2018-09-25 ENCOUNTER — Encounter: Payer: Self-pay | Admitting: Physician Assistant

## 2018-09-25 DIAGNOSIS — J0110 Acute frontal sinusitis, unspecified: Secondary | ICD-10-CM | POA: Diagnosis not present

## 2018-09-25 MED ORDER — AMOXICILLIN-POT CLAVULANATE 875-125 MG PO TABS: 1.0000 | ORAL_TABLET | Freq: Two times a day (BID) | ORAL | 0 refills | Status: DC

## 2018-09-25 NOTE — Progress Notes (Signed)
Telephone visit  Subjective: UY:[id number] PCP: Terald Sleeper, PA-C [id number] R. [place] is a 43 y.o. female calls for telephone consult today. Patient provides verbal consent for consult held via phone.  Patient is identified with 2 separate identifiers.  At this time the entire area is on COVID-19 social distancing and stay home orders are in place.  Patient is of higher risk and therefore we are performing this by a virtual method.  Location of patient: work Location of provider: HOME Others present for call: no   This patient has had many days of sinus headache and postnasal drainage. There is copious drainage at times. Denies any fever at this time. There has been a history of sinus infections in the past.  No history of sinus surgery. There is cough at night. It has become more prevalent in recent days.    ROS: Per HPI  No Known Allergies Past Medical History:  Diagnosis Date  . Anxiety   . Arthritis   . Asthma   . Depression   . Migraines     Current Outpatient Medications:  .  acetaminophen (TYLENOL) 500 MG tablet, Take 500 mg by mouth every 6 (six) hours as needed., Disp: , Rfl:  .  albuterol (PROVENTIL HFA;VENTOLIN HFA) 108 (90 Base) MCG/ACT inhaler, Inhale 2 puffs into the lungs every 6 (six) hours as needed for wheezing or shortness of breath., Disp: 1 Inhaler, Rfl: 0 .  ALPRAZolam (XANAX) 1 MG tablet, Take 1 tablet (1 mg total) by mouth 3 (three) times daily., Disp: 90 tablet, Rfl: 5 .  amoxicillin-clavulanate (AUGMENTIN) 875-125 MG tablet, Take 1 tablet by mouth 2 (two) times daily., Disp: 20 tablet, Rfl: 0 .  brompheniramine-pseudoephedrine-DM 30-2-10 MG/5ML syrup, Take 5 mLs by mouth 4 (four) times daily as needed., Disp: 120 mL, Rfl: 0 .  buPROPion (WELLBUTRIN XL) 150 MG 24 hr tablet, Take 1 tablet (150 mg total) by mouth daily., Disp: 30 tablet, Rfl: 11 .  cetirizine (ZYRTEC) 10 MG tablet, Take 10 mg by mouth daily., Disp: , Rfl:  .  cyclobenzaprine  (FLEXERIL) 10 MG tablet, Take 1 tablet (10 mg total) by mouth 3 (three) times daily as needed for muscle spasms., Disp: 30 tablet, Rfl: 1 .  diclofenac sodium (VOLTAREN) 1 % GEL, Apply 2 g topically 4 (four) times daily., Disp: 300 g, Rfl: 1 .  escitalopram (LEXAPRO) 10 MG tablet, Take 1 tablet by mouth once daily, Disp: 30 tablet, Rfl: 2 .  fluticasone (FLONASE) 50 MCG/ACT nasal spray, Place 2 sprays into both nostrils daily., Disp: 16 g, Rfl: 6 .  Glucosamine-Chondroit-Vit C-Mn (GLUCOSAMINE CHONDR 1500 COMPLX PO), Take by mouth., Disp: , Rfl:  .  guaiFENesin-codeine 100-10 MG/5ML syrup, Take 5-10 mLs by mouth 3 (three) times daily as needed for cough., Disp: 150 mL, Rfl: 0 .  HYDROcodone-acetaminophen (NORCO/VICODIN) 5-325 MG tablet, Take 1 tablet by mouth every 4 (four) hours as needed for moderate pain., Disp: 60 tablet, Rfl: 0 .  HYDROcodone-acetaminophen (NORCO/VICODIN) 5-325 MG tablet, Take 1 tablet by mouth every 6 (six) hours as needed for moderate pain., Disp: 60 tablet, Rfl: 0 .  HYDROcodone-acetaminophen (NORCO/VICODIN) 5-325 MG tablet, Take 1 tablet by mouth every 6 (six) hours as needed for moderate pain., Disp: 60 tablet, Rfl: 0 .  levonorgestrel (MIRENA) 20 MCG/24HR IUD, 1 each by Intrauterine route once., Disp: , Rfl:  .  megestrol (MEGACE) 40 MG tablet, 1 tablet daily, Disp: 30 tablet, Rfl: 11 .  Melatonin 10 MG CAPS,  Take 20 mg by mouth at bedtime., Disp: , Rfl:  .  montelukast (SINGULAIR) 10 MG tablet, TAKE 1 TABLET BY MOUTH AT BEDTIME, Disp: 30 tablet, Rfl: 5 .  Oxcarbazepine (TRILEPTAL) 300 MG tablet, TAKE 1 TABLET BY MOUTH AT BEDTIME. NEEDS TO BE SEEN BEFORE NEXT REFILL, Disp: 30 tablet, Rfl: 1 .  predniSONE (DELTASONE) 20 MG tablet, 2 po at sametime daily for 5 days, Disp: 10 tablet, Rfl: 0 .  pseudoephedrine-codeine-guaifenesin (MYTUSSIN DAC) 30-10-100 MG/5ML solution, Take 10 mLs by mouth 4 (four) times daily as needed for cough., Disp: 240 mL, Rfl: 0 .  SUMAtriptan  (IMITREX) 50 MG tablet, Take 1 tablet (50 mg total) by mouth once for 1 dose. May repeat ONCE in 2 hours if headache persists or recurs., Disp: 10 tablet, Rfl: 11 .  tizanidine (ZANAFLEX) 2 MG capsule, Take 1 capsule (2 mg total) by mouth at bedtime and may repeat dose one time if needed., Disp: 30 capsule, Rfl: 2  Assessment/ Plan: 43 y.o. female   1. Acute non-recurrent frontal sinusitis - amoxicillin-clavulanate (AUGMENTIN) 875-125 MG tablet; Take 1 tablet by mouth 2 (two) times daily.  Dispense: 20 tablet; Refill: 0   Continue all other maintenance medications as listed above.  Start time: 11:57 AM End time: 12:05 PM  Meds ordered this encounter  Medications  . amoxicillin-clavulanate (AUGMENTIN) 875-125 MG tablet    Sig: Take 1 tablet by mouth 2 (two) times daily.    Dispense:  20 tablet    Refill:  0    Order Specific Question:   Supervising Provider    Answer:   Raliegh Ip [[id number]]    Prudy Feeler PA-C [facility] [phone]

## 2018-10-22 ENCOUNTER — Other Ambulatory Visit: Payer: Self-pay | Admitting: Physician Assistant

## 2018-10-22 DIAGNOSIS — F411 Generalized anxiety disorder: Secondary | ICD-10-CM

## 2018-10-22 DIAGNOSIS — F339 Major depressive disorder, recurrent, unspecified: Secondary | ICD-10-CM

## 2018-10-23 ENCOUNTER — Telehealth: Payer: Self-pay | Admitting: Physician Assistant

## 2018-10-23 NOTE — Telephone Encounter (Signed)
Apt scheduled.  

## 2018-10-23 NOTE — Telephone Encounter (Signed)
Jones. NTBS 30 days given on some 08/2018

## 2018-10-23 NOTE — Telephone Encounter (Signed)
appt made for 10/24/18

## 2018-10-23 NOTE — Telephone Encounter (Signed)
What is the name of the medication? Pain med (does not know the name of it)  Have you contacted your pharmacy to request a refill? Yes   Which pharmacy would you like this sent to? [facility]   Patient notified that their request is being sent to the clinical staff for review and that they should receive a call once it is complete. If they do not receive a call within 24 hours they can check with their pharmacy or our office.

## 2018-10-24 ENCOUNTER — Ambulatory Visit (INDEPENDENT_AMBULATORY_CARE_PROVIDER_SITE_OTHER): Admitting: Physician Assistant

## 2018-10-24 ENCOUNTER — Other Ambulatory Visit: Payer: Self-pay

## 2018-10-24 DIAGNOSIS — R197 Diarrhea, unspecified: Secondary | ICD-10-CM

## 2018-10-24 DIAGNOSIS — M7071 Other bursitis of hip, right hip: Secondary | ICD-10-CM

## 2018-10-24 DIAGNOSIS — J309 Allergic rhinitis, unspecified: Secondary | ICD-10-CM

## 2018-10-24 DIAGNOSIS — F411 Generalized anxiety disorder: Secondary | ICD-10-CM | POA: Diagnosis not present

## 2018-10-24 DIAGNOSIS — F339 Major depressive disorder, recurrent, unspecified: Secondary | ICD-10-CM

## 2018-10-24 DIAGNOSIS — G43701 Chronic migraine without aura, not intractable, with status migrainosus: Secondary | ICD-10-CM

## 2018-10-24 DIAGNOSIS — R05 Cough: Secondary | ICD-10-CM

## 2018-10-24 DIAGNOSIS — K649 Unspecified hemorrhoids: Secondary | ICD-10-CM | POA: Diagnosis not present

## 2018-10-24 DIAGNOSIS — J069 Acute upper respiratory infection, unspecified: Secondary | ICD-10-CM

## 2018-10-24 DIAGNOSIS — M25511 Pain in right shoulder: Secondary | ICD-10-CM

## 2018-10-24 DIAGNOSIS — R059 Cough, unspecified: Secondary | ICD-10-CM

## 2018-10-24 DIAGNOSIS — M25512 Pain in left shoulder: Secondary | ICD-10-CM

## 2018-10-24 DIAGNOSIS — N938 Other specified abnormal uterine and vaginal bleeding: Secondary | ICD-10-CM

## 2018-10-24 MED ORDER — FLUTICASONE PROPIONATE 50 MCG/ACT NA SUSP: 2.0000 | Freq: Every day | NASAL | 6 refills | Status: DC

## 2018-10-24 MED ORDER — HYDROCODONE-ACETAMINOPHEN 5-325 MG PO TABS: 1.0000 | ORAL_TABLET | Freq: Four times a day (QID) | ORAL | 0 refills | Status: DC | PRN

## 2018-10-24 MED ORDER — CYCLOBENZAPRINE HCL 10 MG PO TABS: 10.0000 mg | ORAL_TABLET | Freq: Three times a day (TID) | ORAL | 1 refills | Status: DC | PRN

## 2018-10-24 MED ORDER — SUMATRIPTAN SUCCINATE 50 MG PO TABS: 50.0000 mg | ORAL_TABLET | Freq: Once | ORAL | 11 refills | Status: DC

## 2018-10-24 MED ORDER — OXCARBAZEPINE 300 MG PO TABS: ORAL_TABLET | ORAL | 11 refills | Status: DC

## 2018-10-24 MED ORDER — MONTELUKAST SODIUM 10 MG PO TABS: 10.0000 mg | ORAL_TABLET | Freq: Every day | ORAL | 11 refills | Status: DC

## 2018-10-24 MED ORDER — TIZANIDINE HCL 2 MG PO CAPS: 2.0000 mg | ORAL_CAPSULE | Freq: Every evening | ORAL | 2 refills | Status: DC | PRN

## 2018-10-24 MED ORDER — MEGESTROL ACETATE 40 MG PO TABS: ORAL_TABLET | ORAL | 2 refills | Status: DC

## 2018-10-24 MED ORDER — BUPROPION HCL ER (XL) 150 MG PO TB24: 150.0000 mg | ORAL_TABLET | Freq: Every day | ORAL | 11 refills | Status: DC

## 2018-10-24 MED ORDER — HYDROCODONE-ACETAMINOPHEN 5-325 MG PO TABS: 1.0000 | ORAL_TABLET | ORAL | 0 refills | Status: DC | PRN

## 2018-10-24 MED ORDER — OMEPRAZOLE 20 MG PO CPDR: 20.0000 mg | DELAYED_RELEASE_CAPSULE | Freq: Every day | ORAL | 3 refills | Status: DC

## 2018-10-24 MED ORDER — ESCITALOPRAM OXALATE 10 MG PO TABS: 10.0000 mg | ORAL_TABLET | Freq: Every day | ORAL | 11 refills | Status: DC

## 2018-10-24 MED ORDER — ALPRAZOLAM 1 MG PO TABS: 1.0000 mg | ORAL_TABLET | Freq: Three times a day (TID) | ORAL | 5 refills | Status: DC

## 2018-10-24 NOTE — Progress Notes (Signed)
Telephone visit  Subjective: [id number] PCP: Remus Loffler, PA-C TRR:[id number] Kimberly Terrell is a 43 y.o. female calls for telephone consult today. Patient provides verbal consent for consult held via phone.  Patient is identified with 2 separate identifiers.  At this time the entire area is on COVID-19 social distancing and stay home orders are in place.  Patient is of higher risk and therefore we are performing this by a virtual method.  Location of patient: home Location of provider: [facility] Others present for call: no  She is having a phone visit for her chronic medical conditions which do include migraine, allergic rhinitis, bursitis, shoulder somatic dysfunction, generalized anxiety, depression, dysfunctional uterine bleeding.  She is being followed by gynecology.  And they had put her on Megace.  She states that it has been controlling her cycles quite well.  I am sending in a limited refill but have asked her to follow back up with a gynecologist about the long-term use of the medications.  She states that at this time her migraines are still occurring at the same rate.  The medication works well whenever she takes it.  She states that her anxiety and stress are a little bit up because a lot of things going on in her life and in her home.   ROS: Per HPI  No Known Allergies Past Medical History:  Diagnosis Date  . Anxiety   . Arthritis   . Asthma   . Depression   . Migraines     Current Outpatient Medications:  .  acetaminophen (TYLENOL) 500 MG tablet, Take 500 mg by mouth every 6 (six) hours as needed., Disp: , Rfl:  .  albuterol (PROVENTIL HFA;VENTOLIN HFA) 108 (90 Base) MCG/ACT inhaler, Inhale 2 puffs into the lungs every 6 (six) hours as needed for wheezing or shortness of breath., Disp: 1 Inhaler, Rfl: 0 .  ALPRAZolam (XANAX) 1 MG tablet, Take 1 tablet (1 mg total) by mouth 3 (three) times daily., Disp: 90 tablet, Rfl: 5 .  brompheniramine-pseudoephedrine-DM 30-2-10  MG/5ML syrup, Take 5 mLs by mouth 4 (four) times daily as needed., Disp: 120 mL, Rfl: 0 .  buPROPion (WELLBUTRIN XL) 150 MG 24 hr tablet, Take 1 tablet (150 mg total) by mouth daily., Disp: 30 tablet, Rfl: 11 .  cetirizine (ZYRTEC) 10 MG tablet, Take 10 mg by mouth daily., Disp: , Rfl:  .  cyclobenzaprine (FLEXERIL) 10 MG tablet, Take 1 tablet (10 mg total) by mouth 3 (three) times daily as needed for muscle spasms., Disp: 30 tablet, Rfl: 1 .  diclofenac sodium (VOLTAREN) 1 % GEL, Apply 2 g topically 4 (four) times daily., Disp: 300 g, Rfl: 1 .  escitalopram (LEXAPRO) 10 MG tablet, Take 1 tablet (10 mg total) by mouth daily., Disp: 30 tablet, Rfl: 11 .  fluticasone (FLONASE) 50 MCG/ACT nasal spray, Place 2 sprays into both nostrils daily., Disp: 16 g, Rfl: 6 .  Glucosamine-Chondroit-Vit C-Mn (GLUCOSAMINE CHONDR 1500 COMPLX PO), Take by mouth., Disp: , Rfl:  .  guaiFENesin-codeine 100-10 MG/5ML syrup, Take 5-10 mLs by mouth 3 (three) times daily as needed for cough., Disp: 150 mL, Rfl: 0 .  HYDROcodone-acetaminophen (NORCO/VICODIN) 5-325 MG tablet, Take 1 tablet by mouth every 4 (four) hours as needed for moderate pain., Disp: 60 tablet, Rfl: 0 .  HYDROcodone-acetaminophen (NORCO/VICODIN) 5-325 MG tablet, Take 1 tablet by mouth every 6 (six) hours as needed for moderate pain., Disp: 60 tablet, Rfl: 0 .  HYDROcodone-acetaminophen (NORCO/VICODIN)  5-325 MG tablet, Take 1 tablet by mouth every 6 (six) hours as needed for moderate pain., Disp: 60 tablet, Rfl: 0 .  levonorgestrel (MIRENA) 20 MCG/24HR IUD, 1 each by Intrauterine route once., Disp: , Rfl:  .  megestrol (MEGACE) 40 MG tablet, 1 tablet daily, Disp: 30 tablet, Rfl: 2 .  Melatonin 10 MG CAPS, Take 20 mg by mouth at bedtime., Disp: , Rfl:  .  montelukast (SINGULAIR) 10 MG tablet, Take 1 tablet (10 mg total) by mouth at bedtime., Disp: 30 tablet, Rfl: 11 .  omeprazole (PRILOSEC) 20 MG capsule, Take 1 capsule (20 mg total) by mouth daily., Disp: 30  capsule, Rfl: 3 .  Oxcarbazepine (TRILEPTAL) 300 MG tablet, TAKE 1 TABLET BY MOUTH AT BEDTIME. NEEDS TO BE SEEN BEFORE NEXT REFILL, Disp: 30 tablet, Rfl: 11 .  SUMAtriptan (IMITREX) 50 MG tablet, Take 1 tablet (50 mg total) by mouth once for 1 dose. May repeat ONCE in 2 hours if headache persists or recurs., Disp: 10 tablet, Rfl: 11 .  tizanidine (ZANAFLEX) 2 MG capsule, Take 1 capsule (2 mg total) by mouth at bedtime and may repeat dose one time if needed., Disp: 30 capsule, Rfl: 2  Assessment/ Plan: 43 y.o. female   1. GAD (generalized anxiety disorder) - ALPRAZolam (XANAX) 1 MG tablet; Take 1 tablet (1 mg total) by mouth 3 (three) times daily.  Dispense: 90 tablet; Refill: 5 - escitalopram (LEXAPRO) 10 MG tablet; Take 1 tablet (10 mg total) by mouth daily.  Dispense: 30 tablet; Refill: 11  2. Hemorrhoids, unspecified hemorrhoid type  3. Diarrhea, unspecified type  4. Acute pain of both shoulders - cyclobenzaprine (FLEXERIL) 10 MG tablet; Take 1 tablet (10 mg total) by mouth 3 (three) times daily as needed for muscle spasms.  Dispense: 30 tablet; Refill: 1  5. Depression, recurrent (HCC) - escitalopram (LEXAPRO) 10 MG tablet; Take 1 tablet (10 mg total) by mouth daily.  Dispense: 30 tablet; Refill: 11 - buPROPion (WELLBUTRIN XL) 150 MG 24 hr tablet; Take 1 tablet (150 mg total) by mouth daily.  Dispense: 30 tablet; Refill: 11 - Oxcarbazepine (TRILEPTAL) 300 MG tablet; TAKE 1 TABLET BY MOUTH AT BEDTIME. NEEDS TO BE SEEN BEFORE NEXT REFILL  Dispense: 30 tablet; Refill: 11  6. Cough  7. URI with cough and congestion - fluticasone (FLONASE) 50 MCG/ACT nasal spray; Place 2 sprays into both nostrils daily.  Dispense: 16 g; Refill: 6  8. Bursitis of right hip, unspecified bursa - HYDROcodone-acetaminophen (NORCO/VICODIN) 5-325 MG tablet; Take 1 tablet by mouth every 4 (four) hours as needed for moderate pain.  Dispense: 60 tablet; Refill: 0 - HYDROcodone-acetaminophen (NORCO/VICODIN) 5-325  MG tablet; Take 1 tablet by mouth every 6 (six) hours as needed for moderate pain.  Dispense: 60 tablet; Refill: 0 - HYDROcodone-acetaminophen (NORCO/VICODIN) 5-325 MG tablet; Take 1 tablet by mouth every 6 (six) hours as needed for moderate pain.  Dispense: 60 tablet; Refill: 0  9. Allergic rhinitis, unspecified seasonality, unspecified trigger - montelukast (SINGULAIR) 10 MG tablet; Take 1 tablet (10 mg total) by mouth at bedtime.  Dispense: 30 tablet; Refill: 11  10. DUB (dysfunctional uterine bleeding) - megestrol (MEGACE) 40 MG tablet; 1 tablet daily  Dispense: 30 tablet; Refill: 2  11. Chronic migraine without aura with status migrainosus, not intractable - SUMAtriptan (IMITREX) 50 MG tablet; Take 1 tablet (50 mg total) by mouth once for 1 dose. May repeat ONCE in 2 hours if headache persists or recurs.  Dispense: 10 tablet; Refill:  11   No follow-ups on file.  Continue all other maintenance medications as listed above.  Start time: 11:35 AM End time: 11:48 AM  Meds ordered this encounter  Medications  . ALPRAZolam (XANAX) 1 MG tablet    Sig: Take 1 tablet (1 mg total) by mouth 3 (three) times daily.    Dispense:  90 tablet    Refill:  5    Order Specific Question:   Supervising Provider    Answer:   Raliegh IpGOTTSCHALK, ASHLY M [[id number]][[id number]]  . omeprazole (PRILOSEC) 20 MG capsule    Sig: Take 1 capsule (20 mg total) by mouth daily.    Dispense:  30 capsule    Refill:  3    Order Specific Question:   Supervising Provider    Answer:   Raliegh IpGOTTSCHALK, ASHLY M [[id number]][[id number]]  . escitalopram (LEXAPRO) 10 MG tablet    Sig: Take 1 tablet (10 mg total) by mouth daily.    Dispense:  30 tablet    Refill:  11    Order Specific Question:   Supervising Provider    Answer:   Raliegh IpGOTTSCHALK, ASHLY M [[id number]][[id number]]  . cyclobenzaprine (FLEXERIL) 10 MG tablet    Sig: Take 1 tablet (10 mg total) by mouth 3 (three) times daily as needed for muscle spasms.    Dispense:  30 tablet    Refill:  1    Order Specific  Question:   Supervising Provider    Answer:   Raliegh IpGOTTSCHALK, ASHLY M [[id number]][[id number]]  . buPROPion (WELLBUTRIN XL) 150 MG 24 hr tablet    Sig: Take 1 tablet (150 mg total) by mouth daily.    Dispense:  30 tablet    Refill:  11    30 days only Needs to be seen before next refill    Order Specific Question:   Supervising Provider    Answer:   Raliegh IpGOTTSCHALK, ASHLY M [[id number]][[id number]]  . fluticasone (FLONASE) 50 MCG/ACT nasal spray    Sig: Place 2 sprays into both nostrils daily.    Dispense:  16 g    Refill:  6    Order Specific Question:   Supervising Provider    Answer:   Raliegh IpGOTTSCHALK, ASHLY M [[id number]][[id number]]  . HYDROcodone-acetaminophen (NORCO/VICODIN) 5-325 MG tablet    Sig: Take 1 tablet by mouth every 4 (four) hours as needed for moderate pain.    Dispense:  60 tablet    Refill:  0    Fill 60 days from original script date    Order Specific Question:   Supervising Provider    Answer:   Raliegh IpGOTTSCHALK, ASHLY M [[id number]][[id number]]  . HYDROcodone-acetaminophen (NORCO/VICODIN) 5-325 MG tablet    Sig: Take 1 tablet by mouth every 6 (six) hours as needed for moderate pain.    Dispense:  60 tablet    Refill:  0    Fill 30 days from original script date    Order Specific Question:   Supervising Provider    Answer:   Raliegh IpGOTTSCHALK, ASHLY M [[id number]][[id number]]  . HYDROcodone-acetaminophen (NORCO/VICODIN) 5-325 MG tablet    Sig: Take 1 tablet by mouth every 6 (six) hours as needed for moderate pain.    Dispense:  60 tablet    Refill:  0    Order Specific Question:   Supervising Provider    Answer:   Raliegh IpGOTTSCHALK, ASHLY M [[id number]][[id number]]  . SUMAtriptan (IMITREX) 50 MG tablet    Sig: Take 1 tablet (50 mg total) by mouth once for 1 dose. May repeat ONCE in 2 hours  if headache persists or recurs.    Dispense:  10 tablet    Refill:  11    Order Specific Question:   Supervising Provider    Answer:   Janora Norlander [[id number]]  . tizanidine (ZANAFLEX) 2 MG capsule    Sig: Take 1 capsule (2 mg total) by mouth at bedtime and may repeat dose one time  if needed.    Dispense:  30 capsule    Refill:  2    Order Specific Question:   Supervising Provider    Answer:   Janora Norlander [[id number]]  . Oxcarbazepine (TRILEPTAL) 300 MG tablet    Sig: TAKE 1 TABLET BY MOUTH AT BEDTIME. NEEDS TO BE SEEN BEFORE NEXT REFILL    Dispense:  30 tablet    Refill:  11    Order Specific Question:   Supervising Provider    Answer:   Janora Norlander [[id number]]  . montelukast (SINGULAIR) 10 MG tablet    Sig: Take 1 tablet (10 mg total) by mouth at bedtime.    Dispense:  30 tablet    Refill:  11    Order Specific Question:   Supervising Provider    Answer:   Janora Norlander [[id number]]  . megestrol (MEGACE) 40 MG tablet    Sig: 1 tablet daily    Dispense:  30 tablet    Refill:  2    Order Specific Question:   Supervising Provider    Answer:   Janora Norlander [[id number]]    Particia Nearing PA-C [place] [phone]

## 2018-10-26 ENCOUNTER — Encounter: Payer: Self-pay | Admitting: Physician Assistant

## 2018-11-13 ENCOUNTER — Other Ambulatory Visit: Payer: Self-pay | Admitting: Physician Assistant

## 2018-11-13 DIAGNOSIS — F339 Major depressive disorder, recurrent, unspecified: Secondary | ICD-10-CM

## 2018-12-31 ENCOUNTER — Other Ambulatory Visit: Payer: Self-pay | Admitting: Physician Assistant

## 2018-12-31 DIAGNOSIS — J309 Allergic rhinitis, unspecified: Secondary | ICD-10-CM

## 2019-01-01 MED ORDER — MONTELUKAST SODIUM 10 MG PO TABS: 10.0000 mg | ORAL_TABLET | Freq: Every day | ORAL | 1 refills | Status: DC

## 2019-01-01 NOTE — Addendum Note (Signed)
Addended by: Antonietta Barcelona D on: 01/01/2019 10:15 AM   Modules accepted: Orders

## 2019-01-22 ENCOUNTER — Ambulatory Visit (INDEPENDENT_AMBULATORY_CARE_PROVIDER_SITE_OTHER): Admitting: Family

## 2019-01-22 ENCOUNTER — Other Ambulatory Visit: Payer: Self-pay

## 2019-01-22 ENCOUNTER — Encounter: Payer: Self-pay | Admitting: Family

## 2019-01-22 DIAGNOSIS — J029 Acute pharyngitis, unspecified: Secondary | ICD-10-CM

## 2019-01-22 MED ORDER — AMOXICILLIN 875 MG PO TABS: 875.0000 mg | ORAL_TABLET | Freq: Two times a day (BID) | ORAL | 0 refills | Status: DC

## 2019-01-22 NOTE — Progress Notes (Signed)
   Virtual Visit via telephone Note Due to COVID-19 pandemic this visit was conducted virtually. This visit type was conducted due to national recommendations for restrictions regarding the COVID-19 Pandemic (e.g. social distancing, sheltering in place) in an effort to limit this patient's exposure and mitigate transmission in our community. All issues noted in this document were discussed and addressed.  A physical exam was not performed with this format.  I connected with Kimberly Terrell on 01/22/19 at 12:30 pm by telephone and verified that I am speaking with the correct person using two identifiers. Kimberly Terrell is currently located at home and child is currently with her during visit. The provider, Evelina Dun, FNP is located in their office at time of visit.  I discussed the limitations, risks, security and privacy concerns of performing an evaluation and management service by telephone and the availability of in person appointments. I also discussed with the patient that there may be a patient responsible charge related to this service. The patient expressed understanding and agreed to proceed.   History and Present Illness:  Sore Throat  This is a new problem. The current episode started yesterday. The problem has been gradually worsening. There has been no fever. The pain is at a severity of 8/10. The pain is moderate. Associated symptoms include headaches and trouble swallowing. Pertinent negatives include no congestion, coughing, ear discharge, hoarse voice, plugged ear sensation or swollen glands. She has had no exposure to strep. She has tried acetaminophen and NSAIDs for the symptoms. The treatment provided mild relief.      Review of Systems  HENT: Positive for trouble swallowing. Negative for congestion, ear discharge and hoarse voice.   Respiratory: Negative for cough.   Neurological: Positive for headaches.  All other systems reviewed and are negative.     Observations/Objective: No SOB or distress noted  Assessment and Plan: 1. Acute pharyngitis, unspecified etiology - Take meds as prescribed - Use a cool mist humidifier  -Use saline nose sprays frequently -Force fluids -For fever or aces or pains- take tylenol or ibuprofen. -Throat lozenges if help -New toothbrush in 3 days - amoxicillin (AMOXIL) 875 MG tablet; Take 1 tablet (875 mg total) by mouth 2 (two) times daily.  Dispense: 14 tablet; Refill: 0     I discussed the assessment and treatment plan with the patient. The patient was provided an opportunity to ask questions and all were answered. The patient agreed with the plan and demonstrated an understanding of the instructions.   The patient was advised to call back or seek an in-person evaluation if the symptoms worsen or if the condition fails to improve as anticipated.  The above assessment and management plan was discussed with the patient. The patient verbalized understanding of and has agreed to the management plan. Patient is aware to call the clinic if symptoms persist or worsen. Patient is aware when to return to the clinic for a follow-up visit. Patient educated on when it is appropriate to go to the emergency department.   Time call ended:  12:38 pm  I provided 8 minutes of non-face-to-face time during this encounter.    Evelina Dun, FNP

## 2019-02-14 ENCOUNTER — Other Ambulatory Visit: Payer: Self-pay | Admitting: Physician Assistant

## 2019-02-28 ENCOUNTER — Ambulatory Visit: Admitting: Physician Assistant

## 2019-02-28 ENCOUNTER — Telehealth: Payer: Self-pay | Admitting: Physician Assistant

## 2019-02-28 NOTE — Telephone Encounter (Signed)
Patient states that she will go to UC since they already know what is going on with her.

## 2019-03-02 ENCOUNTER — Encounter: Payer: Self-pay | Admitting: Gastroenterology

## 2019-03-15 ENCOUNTER — Ambulatory Visit: Admitting: Nurse Practitioner

## 2019-03-28 ENCOUNTER — Other Ambulatory Visit: Payer: Self-pay

## 2019-03-28 ENCOUNTER — Encounter: Payer: Self-pay | Admitting: Nurse Practitioner

## 2019-03-28 ENCOUNTER — Encounter: Payer: Self-pay | Admitting: *Deleted

## 2019-03-28 ENCOUNTER — Other Ambulatory Visit: Payer: Self-pay | Admitting: *Deleted

## 2019-03-28 ENCOUNTER — Ambulatory Visit (INDEPENDENT_AMBULATORY_CARE_PROVIDER_SITE_OTHER): Admitting: Nurse Practitioner

## 2019-03-28 DIAGNOSIS — A048 Other specified bacterial intestinal infections: Secondary | ICD-10-CM | POA: Insufficient documentation

## 2019-03-28 DIAGNOSIS — R935 Abnormal findings on diagnostic imaging of other abdominal regions, including retroperitoneum: Secondary | ICD-10-CM | POA: Diagnosis not present

## 2019-03-28 DIAGNOSIS — R109 Unspecified abdominal pain: Secondary | ICD-10-CM

## 2019-03-28 NOTE — Assessment & Plan Note (Addendum)
The patient is recently had multiple visits at [facility] urgent care and emergency room for abdominal pain, nausea, vomiting.  Most recently she was in urgent care this past weekend for significant vomiting.  This is improved.  She was diagnosed based on H. pylori serologies as having H. pylori and was treated with triple antibiotic therapy.  She had recurrent symptoms after completing therapy and it appears she was again treated with triple antibiotic therapy.  She is currently on her second course of antibiotics.  She remains on a PPI, although I am not sure if it is Prilosec or Protonix.  My feeling is that it is most likely Protonix.  Her symptoms have improved, but she does have an abnormal CT of the abdomen as per below.  Recommend she continue her medications to completion.  Continue to abstain from all NSAIDs. Continue PPI after triple therapy. EGD as per below.  Call for any worsening or severe symptoms and follow-up in 4 months.

## 2019-03-28 NOTE — Patient Instructions (Signed)
Your health issues we discussed today were:   Abnormal CT of your stomach with H. pylori and previously taking NSAIDs: 1. Your CT scan indicates likely "gastritis" (irritation and inflammation of the lining of your stomach). 2. As we discussed multiple things can cause this including H. pylori, which she did have, and NSAIDs such as naproxen that you were taking 3. Continue to avoid all NSAIDs (ibuprofen, Motrin, Advil, Aleve, naproxen, Naprosyn, aspirin, Goody powders, BC powders, anything with "NSAID" on the bottle). 4. Make sure you finish all of your antibiotics 5. Continue to take your acid blocker (either Protonix or Prilosec based on your medication list) even after completing your antibiotics 6. Call us if you need a refill 7. We will schedule your upper endoscopy for you 8. Further recommendations will follow 9. Call us if you have any worsening or severe symptoms  Overall I recommend:  1. Continue your other current medications 2. Return for follow-up in 4 months 3. Call us if you have any questions or concerns.   Because of recent events of COVID-19 ("Coronavirus"), follow CDC recommendations:  1. Wash your hand frequently 2. Avoid touching your face 3. Stay away from people who are sick 4. If you have symptoms such as fever, cough, shortness of breath then call your healthcare provider for further guidance 5. If you are sick, STAY AT HOME unless otherwise directed by your healthcare provider. 6. Follow directions from state and national officials regarding staying safe   At [facility] we value your feedback. You may receive a survey about your visit today. Please share your experience as we strive to create trusting relationships with our patients to provide genuine, compassionate, quality care.  We appreciate your understanding and patience as we review any laboratory studies, imaging, and other diagnostic tests that are ordered as we care for you. Our  office policy is 5 business days for review of these results, and any emergent or urgent results are addressed in a timely manner for your best interest. If you do not hear from our office in 1 week, please contact us.   We also encourage the use of MyChart, which contains your medical information for your review as well. If you are not enrolled in this feature, an access code is on this after visit summary for your convenience. Thank you for allowing [place] to be involved in your care.  It was great to see you today!  I hope you have a Happy New Year!!

## 2019-03-28 NOTE — Assessment & Plan Note (Addendum)
CT the abdomen completed in the [facility] system (records marked for scanning) demonstrated marked mucosal edema in the antrum consistent with chronic gastritis.  She was previously on naproxen twice daily for chronic hip and knee pain.  She is also had H. pylori infection and is currently on her second course of triple therapy.  Query etiologies such as H. pylori gastritis, NSAID induced gastritis, less likely carcinogenic process.  At this point EGD for further evaluation is warranted.  Continue current medications, continue antibiotics, follow-up in 4 months after procedure.  Proceed with EGD on propofol/MAC with Dr. Oneida Alar in near future: the risks, benefits, and alternatives have been discussed with the patient in detail. The patient states understanding and desires to proceed.  The patient is currently on Xanax, Wellbutrin, Flexeril, Lexapro, Vicodin, Phenergan.  No other anticoagulants, anxiolytics, chronic pain medications, antidepressants, antidiabetics, or iron supplements.  We will plan for the procedure on propofol/MAC to promote adequate sedation.

## 2019-03-28 NOTE — Progress Notes (Signed)
Primary Care Physician:  Remus Loffler, PA-C Primary Gastroenterologist:  Dr. Darrick Penna  Chief Complaint  Patient presents with  . Abdominal Pain    mid upper abd; went to ER at [facility] 03/24/19 and 02/28/19  . Emesis    HPI:   Kimberly Terrell is a 43 y.o. female who presents on referral from urgent care for abdominal pain.  Records reviewed from care everywhere.  The patient was seen at [facility] urgent care and sent to the ED.  She presented for abdominal pain.  She was a bit tachycardic at that time with heart rate of 111.  Her pain was right upper quadrant, persisting for 4 days.  She did have an increased white blood cell count of 18 and previously recommended to go to the ED, which she did.  Ultrasound and CT of the abdomen were negative.  She came in to follow-up.  She was apparently discharged from the emergency department and given a referral to GI from there.  Apparently at some point a stat HIDA scan was completed and negative.  A re-read of the CT abdomen by the radiologist previously completed at [facility] found "Marked mucosal edema of the antrum consistent with prominent gastritis."  She was treated with PPI and antibiotics empirically for H. Pylori.  Follow-up a few days later via telephone found white blood cell count normalized to 9.3.  Recommended continue triple therapy and PPI.  Follow-up phone call 03/24/2019 where the patient indicated she finished up her triple therapy 2 weeks prior and began developing epigastric discomfort again.  History of positive H. pylori.  She indicated she was not able to make her GI referral and had to reschedule the appointment.  Her previous treatment was clindamycin and amoxicillin.  A prescription of amoxicillin and clarithromycin and Protonix was also sent again apparently and advised to come back if no improvement.  Labs found H. pylori IgM elevated at 10.3 on 02/27/2019 (upper limit normal 8.9).  No history of colonoscopy or endoscopy in  our system.  Today she states she's doing ok overall. Her abdominal pain is improving. She has had some recent vomiting. Pain is epigastric and described as epigastric, and described as "like in [employer] when the alien popped out of the guy's stomach." Pain is intermittent and waxes/wanes in intensity. Pain has improved on antibiotics. She went back to urgent care due to the vomiting this weekend, was unable to keep anything down. This has improved since this weekend, is on an antiemetic. Vomiting did make her pain worse. Denies hematemesis. Denies hematochezia, melena, fever, chills, unintentional weight loss. She has had a poor appetite due to her symptoms. She used to take NSAIDs, last dose 2-3 weeks ago when urgent care told her not to. Previously was taking naproxen bid for hip/knee pain. Denies ASA powders. Denies URI or flu-like symptoms. Denies loss of sense of taste or smell. Denies chest pain, dyspnea, dizziness, lightheadedness, syncope, near syncope. Denies any other upper or lower GI symptoms.  Past Medical History:  Diagnosis Date  . Anxiety   . Arthritis   . Asthma   . Bursitis    Right Hip and Right Knee  . Depression   . H. pylori infection    s/p triple therapy x 2  . Migraines     Past Surgical History:  Procedure Laterality Date  . COSMETIC SURGERY  2008   breast implants     Current Outpatient Medications  Medication Sig Dispense Refill  .  acetaminophen (TYLENOL) 500 MG tablet Take 500 mg by mouth every 6 (six) hours as needed.    Marland Kitchen albuterol (PROVENTIL HFA;VENTOLIN HFA) 108 (90 Base) MCG/ACT inhaler Inhale 2 puffs into the lungs every 6 (six) hours as needed for wheezing or shortness of breath. 1 Inhaler 0  . ALPRAZolam (XANAX) 1 MG tablet Take 1 tablet (1 mg total) by mouth 3 (three) times daily. 90 tablet 5  . amoxicillin (AMOXIL) 875 MG tablet Take 1 tablet (875 mg total) by mouth 2 (two) times daily. 14 tablet 0  . buPROPion (WELLBUTRIN XL) 150 MG 24 hr  tablet Take 1 tablet (150 mg total) by mouth daily. 30 tablet 11  . cetirizine (ZYRTEC) 10 MG tablet Take 10 mg by mouth daily.    . clarithromycin (BIAXIN XL) 500 MG 24 hr tablet Take 1,000 mg by mouth 2 (two) times daily.    . cyclobenzaprine (FLEXERIL) 10 MG tablet Take 1 tablet (10 mg total) by mouth 3 (three) times daily as needed for muscle spasms. 30 tablet 1  . diclofenac sodium (VOLTAREN) 1 % GEL Apply 2 g topically 4 (four) times daily. 300 g 1  . escitalopram (LEXAPRO) 10 MG tablet Take 1 tablet (10 mg total) by mouth daily. 30 tablet 11  . famotidine (PEPCID) 20 MG tablet Take 20 mg by mouth daily.    . fluticasone (FLONASE) 50 MCG/ACT nasal spray Place 2 sprays into both nostrils daily. (Patient taking differently: Place 2 sprays into both nostrils as needed. ) 16 g 6  . Glucosamine-Chondroit-Vit C-Mn (GLUCOSAMINE CHONDR 1500 COMPLX PO) Take by mouth daily.     Marland Kitchen HYDROcodone-acetaminophen (NORCO/VICODIN) 5-325 MG tablet Take 1 tablet by mouth every 4 (four) hours as needed for moderate pain. 60 tablet 0  . HYDROcodone-acetaminophen (NORCO/VICODIN) 5-325 MG tablet Take 1 tablet by mouth every 6 (six) hours as needed for moderate pain. 60 tablet 0  . HYDROcodone-acetaminophen (NORCO/VICODIN) 5-325 MG tablet Take 1 tablet by mouth every 6 (six) hours as needed for moderate pain. 60 tablet 0  . levonorgestrel (MIRENA) 20 MCG/24HR IUD 1 each by Intrauterine route once.    . megestrol (MEGACE) 40 MG tablet 1 tablet daily 30 tablet 2  . Melatonin 10 MG CAPS Take 20 mg by mouth at bedtime.    . metoCLOPramide (REGLAN) 10 MG tablet Take 10 mg by mouth 4 (four) times daily -  before meals and at bedtime.    . montelukast (SINGULAIR) 10 MG tablet Take 1 tablet (10 mg total) by mouth at bedtime. 90 tablet 1  . nitrofurantoin, macrocrystal-monohydrate, (MACROBID) 100 MG capsule Take 100 mg by mouth 2 (two) times daily.    Marland Kitchen omeprazole (PRILOSEC) 20 MG capsule Take 1 capsule by mouth once daily 90  capsule 0  . ondansetron (ZOFRAN) 8 MG tablet Take by mouth every 8 (eight) hours as needed for nausea or vomiting.    . Oxcarbazepine (TRILEPTAL) 300 MG tablet TAKE 1 TABLET BY MOUTH AT BEDTIME 90 tablet 0  . pantoprazole (PROTONIX) 20 MG tablet Take 1 tablet by mouth 2 (two) times daily.    . promethazine (PHENERGAN) 25 MG tablet Take 12.5 mg by mouth every 6 (six) hours as needed for nausea or vomiting.    . tizanidine (ZANAFLEX) 2 MG capsule Take 1 capsule (2 mg total) by mouth at bedtime and may repeat dose one time if needed. 30 capsule 2  . SUMAtriptan (IMITREX) 50 MG tablet Take 1 tablet (50 mg total) by mouth  once for 1 dose. May repeat ONCE in 2 hours if headache persists or recurs. (Patient taking differently: Take 50 mg by mouth as needed. May repeat ONCE in 2 hours if headache persists or recurs.) 10 tablet 11   No current facility-administered medications for this visit.    Allergies as of 03/28/2019  . (No Known Allergies)    Family History  Problem Relation Age of Onset  . Depression Mother   . Depression Daughter   . Colon cancer Neg Hx   . Esophageal cancer Neg Hx   . Gastric cancer Neg Hx     Social History   Socioeconomic History  . Marital status: Married    Spouse name: Rosalena Mccorry  . Number of children: 1  . Years of education: Not on file  . Highest education level: Not on file  Occupational History  . Occupation: [employer]  . Smoking status: Current Every Day Smoker    Packs/day: 1.00    Types: Cigarettes  . Smokeless tobacco: Never Used  . Tobacco comment: Planning to quit 03/30/2019  Substance and Sexual Activity  . Alcohol use: No  . Drug use: No  . Sexual activity: Yes    Birth control/protection: I.U.D.  Other Topics Concern  . Not on file  Social History Narrative   Right handed    Lives with [place] Rozo   Caffeine use: coffee-daily   Social Determinants of Health   Financial Resource Strain:   . Difficulty of Paying  Living Expenses: Not on file  Food Insecurity:   . Worried About Programme researcher, broadcasting/film/video in the Last Year: Not on file  . Ran Out of Food in the Last Year: Not on file  Transportation Needs:   . Lack of Transportation (Medical): Not on file  . Lack of Transportation (Non-Medical): Not on file  Physical Activity:   . Days of Exercise per Week: Not on file  . Minutes of Exercise per Session: Not on file  Stress:   . Feeling of Stress : Not on file  Social Connections:   . Frequency of Communication with Friends and Family: Not on file  . Frequency of Social Gatherings with Friends and Family: Not on file  . Attends Religious Services: Not on file  . Active Member of Clubs or Organizations: Not on file  . Attends Banker Meetings: Not on file  . Marital Status: Not on file  Intimate Partner Violence:   . Fear of Current or Ex-Partner: Not on file  . Emotionally Abused: Not on file  . Physically Abused: Not on file  . Sexually Abused: Not on file    Review of Systems: General: Negative for anorexia, weight loss, fever, chills, fatigue, weakness. ENT: Negative for hoarseness, difficulty swallowing. CV: Negative for chest pain, angina, palpitations, peripheral edema.  Respiratory: Negative for dyspnea at rest, cough, sputum, wheezing.  GI: See history of present illness. MS: Admits chronic hip and knee pain.  Derm: Negative for rash or itching.  Endo: Negative for unusual weight change.  Heme: Negative for bruising or bleeding. Allergy: Negative for rash or hives.    Physical Exam: BP 110/70   Pulse 92   Temp (!) 96.9 F (36.1 C) (Temporal)   Ht 5\' 4"  (1.626 m)   Wt 143 lb 9.6 oz (65.1 kg)   BMI 24.65 kg/m  General:   Alert and oriented. Pleasant and cooperative. Well-nourished and well-developed.  Eyes:  Without icterus, sclera  clear and conjunctiva pink.  Ears:  Normal auditory acuity. Cardiovascular:  S1, S2 present without murmurs appreciated. Extremities  without clubbing or edema. Respiratory:  Clear to auscultation bilaterally. No wheezes, rales, or rhonchi. No distress.  Gastrointestinal:  +BS, soft, and non-distended. No worsening TTP. No HSM noted. No guarding or rebound. No masses appreciated.  Rectal:  Deferred  Musculoskalatal:  Symmetrical without gross deformities. Neurologic:  Alert and oriented x4;  grossly normal neurologically. Psych:  Alert and cooperative. Normal mood and affect. Heme/Lymph/Immune: No excessive bruising noted.    03/28/2019 2:23 PM   Disclaimer: This note was dictated with voice recognition software. Similar sounding words can inadvertently be transcribed and may not be corrected upon review.

## 2019-03-29 ENCOUNTER — Encounter: Payer: Self-pay | Admitting: *Deleted

## 2019-03-29 NOTE — Progress Notes (Signed)
CC'ED TO PCP 

## 2019-04-17 ENCOUNTER — Telehealth: Payer: Self-pay | Admitting: Gastroenterology

## 2019-04-17 DIAGNOSIS — K219 Gastro-esophageal reflux disease without esophagitis: Secondary | ICD-10-CM

## 2019-04-17 DIAGNOSIS — A048 Other specified bacterial intestinal infections: Secondary | ICD-10-CM

## 2019-04-17 DIAGNOSIS — R112 Nausea with vomiting, unspecified: Secondary | ICD-10-CM

## 2019-04-17 MED ORDER — PROMETHAZINE HCL 25 MG PO TABS: 12.5000 mg | ORAL_TABLET | Freq: Four times a day (QID) | ORAL | 1 refills | Status: DC | PRN

## 2019-04-17 MED ORDER — PANTOPRAZOLE SODIUM 40 MG PO TBEC: 40.0000 mg | DELAYED_RELEASE_TABLET | Freq: Two times a day (BID) | ORAL | 2 refills | Status: DC

## 2019-04-17 NOTE — Telephone Encounter (Addendum)
I've sent Phenergan to the pharmacy for her.  I've also increased her Protonix to 40 mg bid. Take this first thing in the morning and then 30 minutes before her last meal of the day.  KEEP EGD APPOINTMENT  Let me know if any questions or problems.

## 2019-04-17 NOTE — Telephone Encounter (Signed)
Pt said she has been having this abdominal pain for several months. It has been worse today and the pain is in the middle of her upper abdomen. She rates the pain at a 4-5 at this time. She has been vomiting right much today, for a total of 4 times. She does not have any fever. She has not had diarrhea or constipation.  She is taking her Pantoprazole 20 mg bid. She said the phenergan helped her the most and she would need some sent to [facility] in [place]. She is aware that if she continues to vomit and cannot keep liquids down she should go to the ED and be evaluated to keep from dehydrating.  Forwarding to Wynne Dust, NP to advise and send in Rx for phenergan.

## 2019-04-17 NOTE — Telephone Encounter (Signed)
[phone] PLEASE CALL PATIENT, SHE STATES THAT SHE WILL NOT BE ABLE TO WAIT TO HAVE HER PROCEDURE DONE BECAUSE SHE IS IN SO MUCH PAIN, WANTS TO SPEAK TO SOMEONE FOR RECOMMENDATIONS

## 2019-04-17 NOTE — Telephone Encounter (Signed)
Pt is aware of the plan

## 2019-04-17 NOTE — Addendum Note (Signed)
Addended by: Delane Ginger, Teliyah Royal A on: 04/17/2019 03:40 PM   Modules accepted: Orders

## 2019-04-29 ENCOUNTER — Other Ambulatory Visit: Payer: Self-pay | Admitting: Physician Assistant

## 2019-04-29 DIAGNOSIS — F411 Generalized anxiety disorder: Secondary | ICD-10-CM

## 2019-05-01 ENCOUNTER — Telehealth: Payer: Self-pay | Admitting: Gastroenterology

## 2019-05-01 NOTE — Telephone Encounter (Signed)
Called pt's husband and provided him info. He's trying to see if we are in network. Also gave him phone number to pre-service center.

## 2019-05-01 NOTE — Telephone Encounter (Signed)
Pt's husband called to speak to someone about patient's procedure that's scheduled with [facility] on 05/22/2019 regarding her insurance. He said he spoke to someone at the hospital and the insurance information was "way off". He was asking for our tax ID number and NPI number.  Please call [phone]

## 2019-05-02 ENCOUNTER — Telehealth: Payer: Self-pay | Admitting: Gastroenterology

## 2019-05-02 ENCOUNTER — Telehealth: Payer: Self-pay | Admitting: Physician Assistant

## 2019-05-02 NOTE — Telephone Encounter (Signed)
Pt already has appt on 05/04/19

## 2019-05-02 NOTE — Telephone Encounter (Signed)
Informed endo scheduler to cancel procedure.  FYI to EG.

## 2019-05-02 NOTE — Telephone Encounter (Signed)
Pt said we were out of network and to cancel procedure.

## 2019-05-03 ENCOUNTER — Other Ambulatory Visit: Payer: Self-pay

## 2019-05-04 ENCOUNTER — Encounter: Payer: Self-pay | Admitting: Physician Assistant

## 2019-05-04 ENCOUNTER — Ambulatory Visit (INDEPENDENT_AMBULATORY_CARE_PROVIDER_SITE_OTHER): Admitting: Physician Assistant

## 2019-05-04 VITALS — BP 113/61 | HR 90 | Temp 98.7°F | Ht 64.0 in | Wt 134.0 lb

## 2019-05-04 DIAGNOSIS — M7071 Other bursitis of hip, right hip: Secondary | ICD-10-CM

## 2019-05-04 DIAGNOSIS — J309 Allergic rhinitis, unspecified: Secondary | ICD-10-CM

## 2019-05-04 DIAGNOSIS — Z0000 Encounter for general adult medical examination without abnormal findings: Secondary | ICD-10-CM

## 2019-05-04 DIAGNOSIS — F339 Major depressive disorder, recurrent, unspecified: Secondary | ICD-10-CM

## 2019-05-04 DIAGNOSIS — F411 Generalized anxiety disorder: Secondary | ICD-10-CM | POA: Diagnosis not present

## 2019-05-04 DIAGNOSIS — A048 Other specified bacterial intestinal infections: Secondary | ICD-10-CM | POA: Diagnosis not present

## 2019-05-04 DIAGNOSIS — K219 Gastro-esophageal reflux disease without esophagitis: Secondary | ICD-10-CM | POA: Insufficient documentation

## 2019-05-04 DIAGNOSIS — R5382 Chronic fatigue, unspecified: Secondary | ICD-10-CM

## 2019-05-04 MED ORDER — OXCARBAZEPINE 300 MG PO TABS: ORAL_TABLET | ORAL | 3 refills | Status: DC

## 2019-05-04 MED ORDER — ALPRAZOLAM 1 MG PO TABS: 1.0000 mg | ORAL_TABLET | Freq: Three times a day (TID) | ORAL | 5 refills | Status: DC

## 2019-05-04 MED ORDER — MONTELUKAST SODIUM 10 MG PO TABS: 10.0000 mg | ORAL_TABLET | Freq: Every day | ORAL | 1 refills | Status: DC

## 2019-05-04 MED ORDER — HYDROCODONE-ACETAMINOPHEN 5-325 MG PO TABS: 1.0000 | ORAL_TABLET | Freq: Four times a day (QID) | ORAL | 0 refills | Status: DC | PRN

## 2019-05-04 MED ORDER — HYDROCODONE-ACETAMINOPHEN 5-325 MG PO TABS: 1.0000 | ORAL_TABLET | ORAL | 0 refills | Status: DC | PRN

## 2019-05-04 NOTE — Progress Notes (Signed)
BP 113/61   Pulse 90   Temp 98.7 F (37.1 C)   Ht 5' 4"  (1.626 m)   Wt 134 lb (60.8 kg)   SpO2 96%   BMI 23.00 kg/m    Subjective:    Patient ID: Kimberly Terrell, female    DOB: 11/06/1975, 44 y.o.   MRN: [id number]  HPI 1. GAD (generalized anxiety disorder)  2. Bursitis of right hip, unspecified bursa  3. Allergic rhinitis, unspecified seasonality, unspecified trigger  4. Depression, recurrent ([place])  5. H. pylori infection  6. Gastroesophageal reflux disease without esophagitis  7. Well adult exam  8. Chronic fatigue   HPI: Kimberly Terrell is a 44 y.o. female presenting on 05/04/2019 for Medical Management of Chronic Issues  In reviewing the patient's record she has had increasing abdominal pain, nausea vomiting for the past several months.  She has been seen through urgent care and the emergency department.  She was found to have an H. pylori infection.  Patient has continued with severe pain.  The nausea and vomiting is quite severe.  She is supposed to go and have scopes performed however with her insurance changed this year and she has to get another provider because the current one is not in network.  She reports that she was treated twice for H. pylori.  Also they felt that she may have an ulcer due to the difficulty of resolving her symptoms.  She is currently still taking Protonix, Reglan, Pepcid.  She has not had general blood work done in some time.  She reports that she is quite tired overall.  We are going to perform labs today to look for any anemia or other abnormalities.  She also reports that emergency department told her that there was something abnormal about an EKG.  We will work on getting that record and making a referral as needed.  Today her vitals and pulse are good and strong, regular rate and rhythm.  Patient does have chronic migraines and back pain.  All of her medications are reviewed and she does need refills.  She also has chronic  anxiety and depression.  Past Medical History:  Diagnosis Date  . Anxiety   . Arthritis   . Asthma   . Bursitis    Right Hip and Right Knee  . Depression   . H. pylori infection    s/p triple therapy x 2  . Migraines    Relevant past medical, surgical, family and social history reviewed and updated as indicated. Interim medical history since our last visit reviewed. Allergies and medications reviewed and updated. DATA REVIEWED: CHART IN EPIC  Family History reviewed for pertinent findings.  Review of Systems  Constitutional: Positive for fatigue. Negative for activity change, fever and unexpected weight change.  HENT: Negative.   Eyes: Negative.   Respiratory: Negative.  Negative for cough.   Cardiovascular: Negative.  Negative for chest pain.  Gastrointestinal: Positive for abdominal distention, diarrhea, nausea and vomiting. Negative for abdominal pain.  Endocrine: Negative.   Genitourinary: Negative.  Negative for dysuria.  Musculoskeletal: Negative.   Skin: Negative.   Neurological: Positive for headaches.    Allergies as of 05/04/2019   No Known Allergies     Medication List       Accurate as of May 04, 2019 11:06 AM. If you have any questions, ask your nurse or doctor.        STOP taking these medications   amoxicillin 875 MG  tablet Commonly known as: AMOXIL Stopped by: Terald Sleeper, PA-C   nitrofurantoin (macrocrystal-monohydrate) 100 MG capsule Commonly known as: MACROBID Stopped by: Terald Sleeper, PA-C   omeprazole 20 MG capsule Commonly known as: PRILOSEC Stopped by: Terald Sleeper, PA-C   ondansetron 8 MG tablet Commonly known as: ZOFRAN Stopped by: Terald Sleeper, PA-C     TAKE these medications   acetaminophen 500 MG tablet Commonly known as: TYLENOL Take 500 mg by mouth every 6 (six) hours as needed.   albuterol 108 (90 Base) MCG/ACT inhaler Commonly known as: VENTOLIN HFA Inhale 2 puffs into the lungs every 6 (six) hours as needed  for wheezing or shortness of breath.   ALPRAZolam 1 MG tablet Commonly known as: XANAX Take 1 tablet (1 mg total) by mouth 3 (three) times daily.   buPROPion 150 MG 24 hr tablet Commonly known as: WELLBUTRIN XL Take 1 tablet (150 mg total) by mouth daily.   cetirizine 10 MG tablet Commonly known as: ZYRTEC Take 10 mg by mouth daily.   clarithromycin 500 MG 24 hr tablet Commonly known as: BIAXIN XL Take 1,000 mg by mouth 2 (two) times daily.   cyclobenzaprine 10 MG tablet Commonly known as: FLEXERIL Take 1 tablet (10 mg total) by mouth 3 (three) times daily as needed for muscle spasms.   diclofenac sodium 1 % Gel Commonly known as: VOLTAREN Apply 2 g topically 4 (four) times daily.   dicyclomine 10 MG capsule Commonly known as: BENTYL Take 10 mg by mouth 4 (four) times daily.   escitalopram 10 MG tablet Commonly known as: LEXAPRO Take 1 tablet (10 mg total) by mouth daily.   famotidine 20 MG tablet Commonly known as: PEPCID Take 20 mg by mouth daily.   fluticasone 50 MCG/ACT nasal spray Commonly known as: FLONASE Place 2 sprays into both nostrils daily. What changed:   when to take this  reasons to take this   GLUCOSAMINE CHONDR 1500 COMPLX PO Take by mouth daily.   HYDROcodone-acetaminophen 5-325 MG tablet Commonly known as: NORCO/VICODIN Take 1 tablet by mouth every 6 (six) hours as needed for moderate pain.   HYDROcodone-acetaminophen 5-325 MG tablet Commonly known as: NORCO/VICODIN Take 1 tablet by mouth every 4 (four) hours as needed for moderate pain.   HYDROcodone-acetaminophen 5-325 MG tablet Commonly known as: NORCO/VICODIN Take 1 tablet by mouth every 6 (six) hours as needed for moderate pain.   levonorgestrel 20 MCG/24HR IUD Commonly known as: MIRENA 1 each by Intrauterine route once.   megestrol 40 MG tablet Commonly known as: MEGACE 1 tablet daily   Melatonin 10 MG Caps Take 20 mg by mouth at bedtime.   metoCLOPramide 10 MG  tablet Commonly known as: REGLAN Take 10 mg by mouth 4 (four) times daily -  before meals and at bedtime.   montelukast 10 MG tablet Commonly known as: SINGULAIR Take 1 tablet (10 mg total) by mouth at bedtime.   Oxcarbazepine 300 MG tablet Commonly known as: TRILEPTAL TAKE 1 TABLET BY MOUTH AT BEDTIME   pantoprazole 40 MG tablet Commonly known as: PROTONIX Take 1 tablet (40 mg total) by mouth 2 (two) times daily before a meal.   promethazine 25 MG tablet Commonly known as: PHENERGAN Take 0.5 tablets (12.5 mg total) by mouth every 6 (six) hours as needed for nausea or vomiting.   SUMAtriptan 50 MG tablet Commonly known as: Imitrex Take 1 tablet (50 mg total) by mouth once for 1 dose. May repeat ONCE  in 2 hours if headache persists or recurs. What changed:   when to take this  reasons to take this   tizanidine 2 MG capsule Commonly known as: Zanaflex Take 1 capsule (2 mg total) by mouth at bedtime and may repeat dose one time if needed.          Objective:    BP 113/61   Pulse 90   Temp 98.7 F (37.1 C)   Ht 5' 4"  (1.626 m)   Wt 134 lb (60.8 kg)   SpO2 96%   BMI 23.00 kg/m   No Known Allergies  Wt Readings from Last 3 Encounters:  05/04/19 134 lb (60.8 kg)  03/28/19 143 lb 9.6 oz (65.1 kg)  04/17/18 168 lb 6.4 oz (76.4 kg)    Physical Exam Constitutional:      Appearance: She is well-developed.  HENT:     Head: Normocephalic and atraumatic.     Right Ear: Tympanic membrane, ear canal and external ear normal.     Left Ear: Tympanic membrane, ear canal and external ear normal.     Nose: Nose normal. No rhinorrhea.     Mouth/Throat:     Pharynx: No oropharyngeal exudate or posterior oropharyngeal erythema.  Eyes:     Conjunctiva/sclera: Conjunctivae normal.     Pupils: Pupils are equal, round, and reactive to light.  Cardiovascular:     Rate and Rhythm: Normal rate and regular rhythm.     Heart sounds: Normal heart sounds.  Pulmonary:     Effort:  Pulmonary effort is normal.     Breath sounds: Normal breath sounds.  Abdominal:     General: Bowel sounds are normal.     Palpations: Abdomen is soft.  Musculoskeletal:     Cervical back: Normal range of motion and neck supple.  Skin:    General: Skin is warm and dry.     Findings: No rash.  Neurological:     Mental Status: She is alert and oriented to person, place, and time.     Deep Tendon Reflexes: Reflexes are normal and symmetric.  Psychiatric:        Behavior: Behavior normal.        Thought Content: Thought content normal.        Judgment: Judgment normal.     Results for orders placed or performed in visit on 08/09/17  Rapid Strep Screen ([facility] ONLY)   Specimen: Other   OTHER  Result Value Ref Range   Strep Gp A Ag, IA W/Reflex Negative Negative  Culture, Group A Strep   OTHER  Result Value Ref Range   Strep A Culture CANCELED       Assessment & Plan:   1. GAD (generalized anxiety disorder) - ALPRAZolam (XANAX) 1 MG tablet; Take 1 tablet (1 mg total) by mouth 3 (three) times daily.  Dispense: 90 tablet; Refill: 5  2. Bursitis of right hip, unspecified bursa - HYDROcodone-acetaminophen (NORCO/VICODIN) 5-325 MG tablet; Take 1 tablet by mouth every 6 (six) hours as needed for moderate pain.  Dispense: 60 tablet; Refill: 0 - HYDROcodone-acetaminophen (NORCO/VICODIN) 5-325 MG tablet; Take 1 tablet by mouth every 4 (four) hours as needed for moderate pain.  Dispense: 60 tablet; Refill: 0 - HYDROcodone-acetaminophen (NORCO/VICODIN) 5-325 MG tablet; Take 1 tablet by mouth every 6 (six) hours as needed for moderate pain.  Dispense: 60 tablet; Refill: 0  3. Allergic rhinitis, unspecified seasonality, unspecified trigger - montelukast (SINGULAIR) 10 MG tablet; Take 1 tablet (10  mg total) by mouth at bedtime.  Dispense: 90 tablet; Refill: 1  4. Depression, recurrent ([facility]) - Oxcarbazepine (TRILEPTAL) 300 MG tablet; TAKE 1 TABLET BY MOUTH AT BEDTIME  Dispense: 90 tablet;  Refill: 3  5. H. pylori infection - Ambulatory referral to Gastroenterology - CBC with Differential/Platelet - CMP14+EGFR - Lipid panel  6. Gastroesophageal reflux disease without esophagitis  - Ambulatory referral to Gastroenterology - CBC with Differential/Platelet - CMP14+EGFR - Lipid panel  7. Well adult exam - CBC with Differential/Platelet - CMP14+EGFR - Lipid panel - TSH - Iron  8. Chronic fatigue - CBC with Differential/Platelet - CMP14+EGFR - Lipid panel - TSH - Iron   Continue all other maintenance medications as listed above.  Follow up plan: Recheck 6 months  Educational handout given for [place] PA-C [place] [address]  [place], [place] [address] [phone]   05/04/2019, 11:06 AM

## 2019-05-04 NOTE — Telephone Encounter (Signed)
Noted  

## 2019-05-05 LAB — CBC WITH DIFFERENTIAL/PLATELET
Basophils Absolute: 0.1 10*3/uL (ref 0.0–0.2)
Basos: 1 %
EOS (ABSOLUTE): 0.4 10*3/uL (ref 0.0–0.4)
Eos: 4 %
Hematocrit: 42.1 % (ref 34.0–46.6)
Hemoglobin: 14 g/dL (ref 11.1–15.9)
Immature Grans (Abs): 0 10*3/uL (ref 0.0–0.1)
Immature Granulocytes: 0 %
Lymphocytes Absolute: 2.4 10*3/uL (ref 0.7–3.1)
Lymphs: 21 %
MCH: 30 pg (ref 26.6–33.0)
MCHC: 33.3 g/dL (ref 31.5–35.7)
MCV: 90 fL (ref 79–97)
Monocytes Absolute: 0.8 10*3/uL (ref 0.1–0.9)
Monocytes: 7 %
Neutrophils Absolute: 7.5 10*3/uL — ABNORMAL HIGH (ref 1.4–7.0)
Neutrophils: 67 %
Platelets: 279 10*3/uL (ref 150–450)
RBC: 4.66 x10E6/uL (ref 3.77–5.28)
RDW: 13 % (ref 11.7–15.4)
WBC: 11.2 10*3/uL — ABNORMAL HIGH (ref 3.4–10.8)

## 2019-05-05 LAB — CMP14+EGFR
ALT: 19 IU/L (ref 0–32)
AST: 20 IU/L (ref 0–40)
Albumin/Globulin Ratio: 2.3 — ABNORMAL HIGH (ref 1.2–2.2)
Albumin: 4.6 g/dL (ref 3.8–4.8)
Alkaline Phosphatase: 94 IU/L (ref 39–117)
BUN/Creatinine Ratio: 13 (ref 9–23)
BUN: 9 mg/dL (ref 6–24)
Bilirubin Total: 0.3 mg/dL (ref 0.0–1.2)
CO2: 21 mmol/L (ref 20–29)
Calcium: 9.5 mg/dL (ref 8.7–10.2)
Chloride: 104 mmol/L (ref 96–106)
Creatinine, Ser: 0.67 mg/dL (ref 0.57–1.00)
GFR calc Af Amer: 125 mL/min/{1.73_m2} (ref >59)
GFR calc non Af Amer: 108 mL/min/{1.73_m2} (ref >59)
Globulin, Total: 2 g/dL (ref 1.5–4.5)
Glucose: 86 mg/dL (ref 65–99)
Potassium: 4.6 mmol/L (ref 3.5–5.2)
Sodium: 137 mmol/L (ref 134–144)
Total Protein: 6.6 g/dL (ref 6.0–8.5)

## 2019-05-05 LAB — LIPID PANEL
Chol/HDL Ratio: 3.8 ratio (ref 0.0–4.4)
Cholesterol, Total: 184 mg/dL (ref 100–199)
HDL: 49 mg/dL (ref >39)
LDL Chol Calc (NIH): 120 mg/dL — ABNORMAL HIGH (ref 0–99)
Triglycerides: 79 mg/dL (ref 0–149)
VLDL Cholesterol Cal: 15 mg/dL (ref 5–40)

## 2019-05-05 LAB — TSH: TSH: 0.998 u[IU]/mL (ref 0.450–4.500)

## 2019-05-05 LAB — IRON: Iron: 56 ug/dL (ref 27–159)

## 2019-05-08 ENCOUNTER — Other Ambulatory Visit: Payer: Self-pay | Admitting: Physician Assistant

## 2019-05-08 NOTE — Addendum Note (Signed)
Addended by: Remus Loffler on: 05/08/2019 01:13 PM   Modules accepted: Orders

## 2019-05-08 NOTE — Progress Notes (Unsigned)
tox 

## 2019-05-10 ENCOUNTER — Other Ambulatory Visit: Payer: Self-pay

## 2019-05-10 ENCOUNTER — Other Ambulatory Visit

## 2019-05-10 DIAGNOSIS — F411 Generalized anxiety disorder: Secondary | ICD-10-CM

## 2019-05-10 DIAGNOSIS — M7071 Other bursitis of hip, right hip: Secondary | ICD-10-CM

## 2019-05-14 LAB — TOXASSURE SELECT 13 (MW), URINE

## 2019-05-18 ENCOUNTER — Other Ambulatory Visit (HOSPITAL_COMMUNITY)

## 2019-05-22 ENCOUNTER — Encounter (HOSPITAL_COMMUNITY): Payer: Self-pay

## 2019-05-22 ENCOUNTER — Ambulatory Visit (HOSPITAL_COMMUNITY): Admit: 2019-05-22 | Admitting: Gastroenterology

## 2019-05-22 SURGERY — ESOPHAGOGASTRODUODENOSCOPY (EGD) WITH PROPOFOL
Anesthesia: Monitor Anesthesia Care

## 2019-05-28 ENCOUNTER — Other Ambulatory Visit: Payer: Self-pay | Admitting: Physician Assistant

## 2019-05-28 DIAGNOSIS — F339 Major depressive disorder, recurrent, unspecified: Secondary | ICD-10-CM

## 2019-05-29 ENCOUNTER — Other Ambulatory Visit: Payer: Self-pay | Admitting: Physician Assistant

## 2019-05-29 ENCOUNTER — Other Ambulatory Visit: Payer: Self-pay | Admitting: *Deleted

## 2019-05-29 ENCOUNTER — Telehealth: Payer: Self-pay | Admitting: Physician Assistant

## 2019-05-29 DIAGNOSIS — F339 Major depressive disorder, recurrent, unspecified: Secondary | ICD-10-CM

## 2019-05-29 NOTE — Telephone Encounter (Signed)
  Medication Request  05/29/2019  What is the name of the medication? Lexapro  Have you contacted your pharmacy to request a refill?  Yes  Which pharmacy would you like this sent to?  [facility], [place]   Patient notified that their request is being sent to the clinical staff for review and that they should receive a call once it is complete. If they do not receive a call within 24 hours they can check with their pharmacy or our office.

## 2019-05-29 NOTE — Telephone Encounter (Signed)
Left message to please call our office for questions.  Script was done last July and had eleven refills.  This script should still have several more refills.

## 2019-06-04 ENCOUNTER — Ambulatory Visit (INDEPENDENT_AMBULATORY_CARE_PROVIDER_SITE_OTHER): Admitting: Nurse Practitioner

## 2019-06-04 ENCOUNTER — Encounter: Payer: Self-pay | Admitting: Nurse Practitioner

## 2019-06-04 DIAGNOSIS — J0100 Acute maxillary sinusitis, unspecified: Secondary | ICD-10-CM

## 2019-06-04 MED ORDER — AMOXICILLIN-POT CLAVULANATE 875-125 MG PO TABS: 1.0000 | ORAL_TABLET | Freq: Two times a day (BID) | ORAL | 0 refills | Status: DC

## 2019-06-04 NOTE — Progress Notes (Signed)
Virtual Visit via telephone Note Due to COVID-19 pandemic this visit was conducted virtually. This visit type was conducted due to national recommendations for restrictions regarding the COVID-19 Pandemic (e.g. social distancing, sheltering in place) in an effort to limit this patient's exposure and mitigate transmission in our community. All issues noted in this document were discussed and addressed.  A physical exam was not performed with this format.  I connected with Kimberly Terrell on 06/04/19 at 11:40 by telephone and verified that I am speaking with the correct person using two identifiers. Kimberly Terrell is currently located at home and no one is currently with  her during visit. The provider, Mary-Margaret Daphine Deutscher, FNP is located in their office at time of visit.  I discussed the limitations, risks, security and privacy concerns of performing an evaluation and management service by telephone and the availability of in person appointments. I also discussed with the patient that there may be a patient responsible charge related to this service. The patient expressed understanding and agreed to proceed.   History and Present Illness:   Chief Complaint: URI   HPI Patent calls in c/o sore throat, burning eyes, nasal congestion and lots of facial pressure. Started 3-4 days ago and woke up with a bad headache this morning. Denies fever and no loss of taste or smell. No covid exposure that sh eis aware of.   Review of Systems  Constitutional: Negative for chills, diaphoresis, fever and weight loss.  HENT: Positive for congestion and sinus pain.   Eyes: Negative for blurred vision, double vision and pain.  Respiratory: Positive for cough (slight ). Negative for shortness of breath.   Cardiovascular: Negative for chest pain, palpitations, orthopnea and leg swelling.  Gastrointestinal: Negative for abdominal pain.  Skin: Negative for rash.  Neurological: Positive for headaches.  Negative for dizziness, sensory change, loss of consciousness and weakness.  Endo/Heme/Allergies: Negative for polydipsia. Does not bruise/bleed easily.  Psychiatric/Behavioral: Negative for memory loss. The patient does not have insomnia.   All other systems reviewed and are negative.    Observations/Objective: Alert and oriented- answers all questions appropriately No distress Voice nasal sounding No cough during consultation   Assessment and Plan: Todd R. Revels in today with chief complaint of URI   1. Acute non-recurrent maxillary sinusitis 1. Take meds as prescribed 2. Use a cool mist humidifier especially during the winter months and when heat has been humid. 3. Use saline nose sprays frequently 4. Saline irrigations of the nose can be very helpful if done frequently.  * 4X daily for 1 week*  * Use of a nettie pot can be helpful with this. Follow directions with this* 5. Drink plenty of fluids 6. Keep thermostat turn down low 7.For any cough or congestion  Use plain Mucinex- regular strength or max strength is fine   * Children- consult with Pharmacist for dosing 8. For fever or aces or pains- take tylenol or ibuprofen appropriate for age and weight.  * for fevers greater than 101 orally you may alternate ibuprofen and tylenol every  3 hours.    - amoxicillin-clavulanate (AUGMENTIN) 875-125 MG tablet; Take 1 tablet by mouth 2 (two) times daily.  Dispense: 14 tablet; Refill: 0    Follow Up Instructions: Prn     I discussed the assessment and treatment plan with the patient. The patient was provided an opportunity to ask questions and all were answered. The patient agreed with the plan and demonstrated an understanding of the  instructions.   The patient was advised to call back or seek an in-person evaluation if the symptoms worsen or if the condition fails to improve as anticipated.  The above assessment and management plan was discussed with the patient. The  patient verbalized understanding of and has agreed to the management plan. Patient is aware to call the clinic if symptoms persist or worsen. Patient is aware when to return to the clinic for a follow-up visit. Patient educated on when it is appropriate to go to the emergency department.   Time call ended:  11:57  I provided 12 minutes of non-face-to-face time during this encounter.    Mary-Margaret Hassell Done, FNP

## 2019-06-12 ENCOUNTER — Other Ambulatory Visit: Payer: Self-pay | Admitting: Physician Assistant

## 2019-06-12 ENCOUNTER — Telehealth: Payer: Self-pay | Admitting: Physician Assistant

## 2019-06-12 DIAGNOSIS — R935 Abnormal findings on diagnostic imaging of other abdominal regions, including retroperitoneum: Secondary | ICD-10-CM

## 2019-06-12 DIAGNOSIS — K219 Gastro-esophageal reflux disease without esophagitis: Secondary | ICD-10-CM

## 2019-06-12 DIAGNOSIS — A048 Other specified bacterial intestinal infections: Secondary | ICD-10-CM

## 2019-06-12 NOTE — Telephone Encounter (Signed)
Pt says that we referred her to see Dr Loreta Ave for Gastroenterology and says she talked to someone on the phone from there and was told based on Dr Loreta Ave reviewing pt's records, there was nothing they could do to help pt and advised pt to be referred to someone else, preferrably a teaching hospital.   Pt said [facility] or nurse could call her husband back with more details if needed. [phone] Eleyna Brugh (spouse)

## 2019-06-12 NOTE — Telephone Encounter (Signed)
I do not see any correspondence from Dr. Kenna Gilbert Office regarding this - but it may be because when correspondence are received they go to the scan center to be scanned in the Patient's chart & it may just be waiting from scan center.

## 2019-06-12 NOTE — Telephone Encounter (Signed)
Please use Feb notes and hospital records

## 2019-06-12 NOTE — Telephone Encounter (Signed)
Advise ,please. 

## 2019-06-12 NOTE — Telephone Encounter (Signed)
Was any information received on this from the gastroenterologist?

## 2019-07-04 ENCOUNTER — Inpatient Hospital Stay (HOSPITAL_COMMUNITY)
Admission: EM | Admit: 2019-07-04 | Discharge: 2019-07-06 | DRG: 700 | Disposition: A | Discharge status: Home / Self Care | Attending: Family Medicine | Admitting: Family Medicine

## 2019-07-04 ENCOUNTER — Emergency Department (HOSPITAL_COMMUNITY)

## 2019-07-04 ENCOUNTER — Encounter (HOSPITAL_COMMUNITY): Payer: Self-pay | Admitting: Emergency Medicine

## 2019-07-04 ENCOUNTER — Other Ambulatory Visit: Payer: Self-pay

## 2019-07-04 DIAGNOSIS — F411 Generalized anxiety disorder: Secondary | ICD-10-CM | POA: Diagnosis present

## 2019-07-04 DIAGNOSIS — Z9882 Breast implant status: Secondary | ICD-10-CM | POA: Diagnosis not present

## 2019-07-04 DIAGNOSIS — N7093 Salpingitis and oophoritis, unspecified: Secondary | ICD-10-CM | POA: Diagnosis present

## 2019-07-04 DIAGNOSIS — Z818 Family history of other mental and behavioral disorders: Secondary | ICD-10-CM

## 2019-07-04 DIAGNOSIS — Z79890 Hormone replacement therapy (postmenopausal): Secondary | ICD-10-CM | POA: Diagnosis not present

## 2019-07-04 DIAGNOSIS — Z79899 Other long term (current) drug therapy: Secondary | ICD-10-CM | POA: Diagnosis not present

## 2019-07-04 DIAGNOSIS — Y768 Miscellaneous obstetric and gynecological devices associated with adverse incidents, not elsewhere classified: Secondary | ICD-10-CM | POA: Diagnosis present

## 2019-07-04 DIAGNOSIS — K219 Gastro-esophageal reflux disease without esophagitis: Secondary | ICD-10-CM | POA: Diagnosis present

## 2019-07-04 DIAGNOSIS — Z30432 Encounter for removal of intrauterine contraceptive device: Secondary | ICD-10-CM

## 2019-07-04 DIAGNOSIS — F419 Anxiety disorder, unspecified: Secondary | ICD-10-CM | POA: Diagnosis present

## 2019-07-04 DIAGNOSIS — Z8619 Personal history of other infectious and parasitic diseases: Secondary | ICD-10-CM

## 2019-07-04 DIAGNOSIS — F17210 Nicotine dependence, cigarettes, uncomplicated: Secondary | ICD-10-CM | POA: Diagnosis present

## 2019-07-04 DIAGNOSIS — Z20822 Contact with and (suspected) exposure to covid-19: Secondary | ICD-10-CM | POA: Diagnosis present

## 2019-07-04 DIAGNOSIS — Z888 Allergy status to other drugs, medicaments and biological substances status: Secondary | ICD-10-CM

## 2019-07-04 DIAGNOSIS — Z79891 Long term (current) use of opiate analgesic: Secondary | ICD-10-CM | POA: Diagnosis not present

## 2019-07-04 DIAGNOSIS — Z886 Allergy status to analgesic agent status: Secondary | ICD-10-CM

## 2019-07-04 DIAGNOSIS — F329 Major depressive disorder, single episode, unspecified: Secondary | ICD-10-CM | POA: Diagnosis present

## 2019-07-04 DIAGNOSIS — M7071 Other bursitis of hip, right hip: Secondary | ICD-10-CM

## 2019-07-04 DIAGNOSIS — M1990 Unspecified osteoarthritis, unspecified site: Secondary | ICD-10-CM | POA: Diagnosis present

## 2019-07-04 DIAGNOSIS — T8579XA Infection and inflammatory reaction due to other internal prosthetic devices, implants and grafts, initial encounter: Secondary | ICD-10-CM | POA: Diagnosis present

## 2019-07-04 DIAGNOSIS — T8369XA Infection and inflammatory reaction due to other prosthetic device, implant and graft in genital tract, initial encounter: Principal | ICD-10-CM | POA: Diagnosis present

## 2019-07-04 DIAGNOSIS — Z91048 Other nonmedicinal substance allergy status: Secondary | ICD-10-CM

## 2019-07-04 LAB — URINALYSIS, ROUTINE W REFLEX MICROSCOPIC
Bilirubin Urine: NEGATIVE
Glucose, UA: NEGATIVE mg/dL
Ketones, ur: NEGATIVE mg/dL
Leukocytes,Ua: NEGATIVE
Nitrite: NEGATIVE
Protein, ur: NEGATIVE mg/dL
Specific Gravity, Urine: 1.008 (ref 1.005–1.030)
pH: 6 (ref 5.0–8.0)

## 2019-07-04 LAB — COMPREHENSIVE METABOLIC PANEL
ALT: 17 U/L (ref 0–44)
AST: 18 U/L (ref 15–41)
Albumin: 3.7 g/dL (ref 3.5–5.0)
Alkaline Phosphatase: 115 U/L (ref 38–126)
Anion gap: 11 (ref 5–15)
BUN: 7 mg/dL (ref 6–20)
CO2: 23 mmol/L (ref 22–32)
Calcium: 8.9 mg/dL (ref 8.9–10.3)
Chloride: 100 mmol/L (ref 98–111)
Creatinine, Ser: 0.72 mg/dL (ref 0.44–1.00)
GFR calc Af Amer: >60 mL/min (ref >60)
GFR calc non Af Amer: >60 mL/min (ref >60)
Glucose, Bld: 93 mg/dL (ref 70–99)
Potassium: 3.8 mmol/L (ref 3.5–5.1)
Sodium: 134 mmol/L — ABNORMAL LOW (ref 135–145)
Total Bilirubin: 0.6 mg/dL (ref 0.3–1.2)
Total Protein: 6.7 g/dL (ref 6.5–8.1)

## 2019-07-04 LAB — LIPASE, BLOOD: Lipase: 17 U/L (ref 11–51)

## 2019-07-04 LAB — CBC
HCT: 40.3 % (ref 36.0–46.0)
Hemoglobin: 13.1 g/dL (ref 12.0–15.0)
MCH: 29.8 pg (ref 26.0–34.0)
MCHC: 32.5 g/dL (ref 30.0–36.0)
MCV: 91.6 fL (ref 80.0–100.0)
Platelets: 241 10*3/uL (ref 150–400)
RBC: 4.4 MIL/uL (ref 3.87–5.11)
RDW: 13.2 % (ref 11.5–15.5)
WBC: 19.8 10*3/uL — ABNORMAL HIGH (ref 4.0–10.5)
nRBC: 0 % (ref 0.0–0.2)

## 2019-07-04 LAB — WET PREP, GENITAL
Sperm: NONE SEEN
Trich, Wet Prep: NONE SEEN
Yeast Wet Prep HPF POC: NONE SEEN

## 2019-07-04 LAB — I-STAT BETA HCG BLOOD, ED (MC, WL, AP ONLY): I-stat hCG, quantitative: <5 m[IU]/mL (ref <5)

## 2019-07-04 MED ORDER — PANTOPRAZOLE SODIUM 40 MG PO TBEC
40.0000 mg | DELAYED_RELEASE_TABLET | Freq: Two times a day (BID) | ORAL | Status: DC
  Administered 2019-07-05 – 2019-07-06 (×3): 40 mg via ORAL
  Filled 2019-07-04 (×2): qty 1

## 2019-07-04 MED ORDER — METOCLOPRAMIDE HCL 5 MG/ML IJ SOLN: 10.0000 mg | Freq: Four times a day (QID) | INTRAMUSCULAR | Status: DC | PRN

## 2019-07-04 MED ORDER — SENNOSIDES-DOCUSATE SODIUM 8.6-50 MG PO TABS: 1.0000 | ORAL_TABLET | Freq: Every evening | ORAL | Status: DC | PRN

## 2019-07-04 MED ORDER — BUPROPION HCL ER (XL) 150 MG PO TB24
150.0000 mg | ORAL_TABLET | Freq: Every day | ORAL | Status: DC
  Administered 2019-07-05 – 2019-07-06 (×2): 150 mg via ORAL
  Filled 2019-07-04: qty 1

## 2019-07-04 MED ORDER — MENTHOL 3 MG MT LOZG
1.0000 | LOZENGE | OROMUCOSAL | Status: DC | PRN
  Filled 2019-07-04: qty 9

## 2019-07-04 MED ORDER — DOXYCYCLINE HYCLATE 100 MG PO TABS
100.0000 mg | ORAL_TABLET | Freq: Two times a day (BID) | ORAL | Status: DC
  Administered 2019-07-04 – 2019-07-06 (×4): 100 mg via ORAL
  Filled 2019-07-04 (×4): qty 1

## 2019-07-04 MED ORDER — FLUTICASONE PROPIONATE 50 MCG/ACT NA SUSP
2.0000 | Freq: Every day | NASAL | Status: DC | PRN
  Filled 2019-07-04: qty 16

## 2019-07-04 MED ORDER — ENOXAPARIN SODIUM 40 MG/0.4ML ~~LOC~~ SOLN
40.0000 mg | SUBCUTANEOUS | Status: DC
  Filled 2019-07-04: qty 0.4

## 2019-07-04 MED ORDER — MELATONIN 3 MG PO TABS
18.0000 mg | ORAL_TABLET | Freq: Every day | ORAL | Status: DC
  Administered 2019-07-05 (×2): 18 mg via ORAL
  Filled 2019-07-04 (×4): qty 6

## 2019-07-04 MED ORDER — HYDROCODONE-ACETAMINOPHEN 5-325 MG PO TABS
1.0000 | ORAL_TABLET | ORAL | Status: DC | PRN
  Administered 2019-07-05: 02:00:00 1 via ORAL
  Administered 2019-07-05 (×3): 2 via ORAL
  Filled 2019-07-04 (×2): qty 2
  Filled 2019-07-04: qty 1
  Filled 2019-07-04: qty 2

## 2019-07-04 MED ORDER — SODIUM CHLORIDE 0.9 % IV SOLN
3.0000 g | Freq: Four times a day (QID) | INTRAVENOUS | Status: DC
  Administered 2019-07-05 – 2019-07-06 (×7): 3 g via INTRAVENOUS
  Filled 2019-07-04 (×3): qty 3
  Filled 2019-07-04: qty 8
  Filled 2019-07-04: qty 3
  Filled 2019-07-04: qty 8
  Filled 2019-07-04: qty 3
  Filled 2019-07-04: qty 8
  Filled 2019-07-04: qty 3
  Filled 2019-07-04: qty 8
  Filled 2019-07-04: qty 3

## 2019-07-04 MED ORDER — OXCARBAZEPINE 300 MG PO TABS
300.0000 mg | ORAL_TABLET | Freq: Every day | ORAL | Status: DC
  Administered 2019-07-04 – 2019-07-05 (×2): 300 mg via ORAL
  Filled 2019-07-04 (×2): qty 1

## 2019-07-04 MED ORDER — DICLOFENAC SODIUM 1 % TD GEL: 2.0000 g | Freq: Four times a day (QID) | TRANSDERMAL | Status: DC | PRN

## 2019-07-04 MED ORDER — ALUM & MAG HYDROXIDE-SIMETH 200-200-20 MG/5ML PO SUSP: 30.0000 mL | ORAL | Status: DC | PRN

## 2019-07-04 MED ORDER — PSEUDOEPHEDRINE HCL 30 MG PO TABS
30.0000 mg | ORAL_TABLET | ORAL | Status: DC | PRN
  Filled 2019-07-04: qty 1

## 2019-07-04 MED ORDER — GUAIFENESIN 100 MG/5ML PO SOLN
15.0000 mL | ORAL | Status: DC | PRN
  Filled 2019-07-04: qty 15

## 2019-07-04 MED ORDER — ALPRAZOLAM 0.5 MG PO TABS
1.0000 mg | ORAL_TABLET | Freq: Two times a day (BID) | ORAL | Status: DC | PRN
  Administered 2019-07-04 – 2019-07-05 (×2): 1 mg via ORAL
  Filled 2019-07-04 (×2): qty 4

## 2019-07-04 MED ORDER — ALBUTEROL SULFATE HFA 108 (90 BASE) MCG/ACT IN AERS
2.0000 | INHALATION_SPRAY | Freq: Four times a day (QID) | RESPIRATORY_TRACT | Status: DC | PRN
  Filled 2019-07-04: qty 6.7

## 2019-07-04 MED ORDER — MONTELUKAST SODIUM 10 MG PO TABS
10.0000 mg | ORAL_TABLET | Freq: Every evening | ORAL | Status: DC | PRN
  Filled 2019-07-04: qty 1

## 2019-07-04 MED ORDER — SODIUM CHLORIDE 0.9 % IV BOLUS
1000.0000 mL | Freq: Once | INTRAVENOUS | Status: AC
  Administered 2019-07-04: 1000 mL via INTRAVENOUS

## 2019-07-04 MED ORDER — IOHEXOL 300 MG/ML  SOLN
100.0000 mL | Freq: Once | INTRAMUSCULAR | Status: AC | PRN
  Administered 2019-07-04: 100 mL via INTRAVENOUS

## 2019-07-04 MED ORDER — ESCITALOPRAM OXALATE 20 MG PO TABS
20.0000 mg | ORAL_TABLET | Freq: Every day | ORAL | Status: DC
  Administered 2019-07-05 – 2019-07-06 (×2): 20 mg via ORAL
  Filled 2019-07-04: qty 2

## 2019-07-04 NOTE — ED Notes (Signed)
Care endorsed to Tyler, RN

## 2019-07-04 NOTE — H&P (Signed)
  Kimberly Terrell is an 44 y.o. G45P1011 female.   Chief Complaint: Lower abdominal pain HPI: Has h/o abnormal menses, which led to IUD insertion some 5 years ago. Saw Dr. Despina Hidden at [facility] in 2019 with abnormal bleeding and placed on Megace. She reports she just ran out of this medicine and began having menses and then has developed lower abdominal pain and tenderness. No significant fever, or nausea noted. She was evaluated in the ED and found to have a 4 cm TOA with IUD in place. She requests removal.  Past Medical History:  Diagnosis Date  . Anxiety   . Arthritis   . Asthma   . Bursitis    Right Hip and Right Knee  . Depression   . H. pylori infection    s/p triple therapy x 2  . Migraines     Past Surgical History:  Procedure Laterality Date  . COSMETIC SURGERY  2008   breast implants     Family History  Problem Relation Age of Onset  . Depression Mother   . Depression Daughter   . Colon cancer Neg Hx   . Esophageal cancer Neg Hx   . Gastric cancer Neg Hx    Social History:  reports that she has been smoking cigarettes. She has been smoking about 1.00 pack per day. She has never used smokeless tobacco. She reports that she does not drink alcohol or use drugs.  Allergies:  Allergies  Allergen Reactions  . Tape Other (See Comments)    The "plastic-like tape causes redness and irritation"  . Aspirin Other (See Comments)    Caused chest pain  . Imitrex [Sumatriptan] Other (See Comments)    "Gave me lockjaw"    (Not in a hospital admission)   A comprehensive review of systems was negative.  Blood pressure 113/73, pulse 96, temperature 99 F (37.2 C), temperature source Oral, resp. rate 16, height 5\' 3"  (1.6 m), weight 65.8 kg, last menstrual period 07/04/2019, SpO2 98 %. BP 130/71   Pulse 86   Temp 99 F (37.2 C) (Oral)   Resp 16   Ht 5\' 3"  (1.6 m)   Wt 65.8 kg   LMP 07/04/2019   SpO2 98%   BMI 25.69 kg/m  General appearance: alert, cooperative and  appears stated age Head: Normocephalic, without obvious abnormality, atraumatic Neck: supple, symmetrical, trachea midline Lungs: normal effort Heart: tachy, regular Abdomen: soft, tender to palpation in RLQ with rebound Pelvic: cervix normal in appearance, external genitalia normal and IUD strings noted Extremities: Homans sign is negative, no sign of DVT Skin: Skin color, texture, turgor normal. No rashes or lesions Neurologic: Grossly normal  Procedure: Speculum placed inside vagina.  Cervix visualized.  Strings grasped with ring forceps.  IUD removed intact.  Lab Results  Component Value Date   WBC 19.8 (H) 07/04/2019   HGB 13.1 07/04/2019   HCT 40.3 07/04/2019   MCV 91.6 07/04/2019   PLT 241 07/04/2019   Lab Results  Component Value Date   HCG <5.0 07/04/2019     Assessment/Plan Principal Problem:   TOA (tubo-ovarian abscess) Active Problems:   IUD infection (HCC)  Given 5 year h/o IUD, will treat with Unasyn and Doxy, to ensure Actinomycosis coverage. Advised of possible need for IR drainage if no clinical improvement after 48 hours.  09/03/2019 07/04/2019, 9:13 PM

## 2019-07-04 NOTE — ED Notes (Signed)
Husband, Oretha Caprice would like an update [phone]

## 2019-07-04 NOTE — ED Provider Notes (Signed)
[facility] EMERGENCY DEPARTMENT Provider Note   CSN: [id number] Arrival date & time: 07/04/19  1209     History Chief Complaint  Patient presents with  . Abdominal Pain    Kimberly Terrell is a 44 y.o. female with a past medical history of H. pylori, GERD, depression, chronic headache, who presents today for evaluation of lower abdominal pain. She reports that she has been seen recently at [facility] for her upper abdominal pain which was felt to be a possible ulcer from H. pylori however this pain is different.  The pain is in her lower abdomen and started yesterday.  She reports mild nausea however denies vomiting or diarrhea.  Last bowel movement was yesterday and was normal for her.  She denies any fevers at home however reports poor appetite. She started her menstrual cycle yesterday and has an IUD in place that she states has been there for 5 years. She denies any abnormal discharge prior to the start of her menstrual cycle. She reports that she is sexually active with her husband. She has never had any abdominal surgeries before.   She denies dysuria, increased frequency or urgency.  HPI     Past Medical History:  Diagnosis Date  . Anxiety   . Arthritis   . Asthma   . Bursitis    Right Hip and Right Knee  . Depression   . H. pylori infection    s/p triple therapy x 2  . Migraines     Patient Active Problem List   Diagnosis Date Noted  . TOA (tubo-ovarian abscess) 07/04/2019  . IUD infection (HCC) 07/04/2019  . Gastroesophageal reflux disease without esophagitis 05/04/2019  . H. pylori infection 03/28/2019  . Abnormal CT of the abdomen 03/28/2019  . Chronic daily headache 10/12/2017  . Allergic rhinitis 07/25/2017  . Depression, recurrent (HCC) 04/04/2017  . Migraine 04/04/2017  . Bursitis of right hip 04/04/2017  . GAD (generalized anxiety disorder) 04/04/2017  . DUB (dysfunctional uterine bleeding) 04/04/2017    Past Surgical History:    Procedure Laterality Date  . COSMETIC SURGERY  2008   breast implants      OB History    Gravida  2   Para  1   Term  1   Preterm      AB  1   Living  1     SAB  1   TAB      Ectopic      Multiple      Live Births  1           Family History  Problem Relation Age of Onset  . Depression Mother   . Depression Daughter   . Colon cancer Neg Hx   . Esophageal cancer Neg Hx   . Gastric cancer Neg Hx     Social History   Tobacco Use  . Smoking status: Current Every Day Smoker    Packs/day: 1.00    Types: Cigarettes  . Smokeless tobacco: Never Used  . Tobacco comment: Planning to quit 03/30/2019  Substance Use Topics  . Alcohol use: No  . Drug use: No    Home Medications Prior to Admission medications   Medication Sig Start Date End Date Taking? Authorizing Provider  acetaminophen (TYLENOL) 500 MG tablet Take 500-1,000 mg by mouth every 6 (six) hours as needed for mild pain or headache.    Yes [provider]  albuterol (PROVENTIL HFA;VENTOLIN HFA) 108 (90 Base) MCG/ACT inhaler  Inhale 2 puffs into the lungs every 6 (six) hours as needed for wheezing or shortness of breath. 12/12/17  Yes Terald Sleeper, PA-C  ALPRAZolam Duanne Moron) 1 MG tablet Take 1 tablet (1 mg total) by mouth 3 (three) times daily. Patient taking differently: Take 1 mg by mouth in the morning and at bedtime.  05/04/19  Yes Terald Sleeper, PA-C  buPROPion (WELLBUTRIN XL) 150 MG 24 hr tablet Take 1 tablet (150 mg total) by mouth daily. 10/24/18  Yes Terald Sleeper, PA-C  diclofenac sodium (VOLTAREN) 1 % GEL Apply 2 g topically 4 (four) times daily. Patient taking differently: Apply 2 g topically 4 (four) times daily as needed (to painful sites).  06/27/18  Yes Martin, Mary-Margaret, FNP  escitalopram (LEXAPRO) 20 MG tablet TAKE 1 TABLET BY MOUTH ONCE DAILY TAKE  WITH  A  10MG   ALSO Patient taking differently: Take 20 mg by mouth daily.  05/29/19  Yes Terald Sleeper, PA-C  fluticasone  (FLONASE) 50 MCG/ACT nasal spray Place 2 sprays into both nostrils daily. Patient taking differently: Place 2 sprays into both nostrils daily as needed for allergies or rhinitis.  10/24/18  Yes Terald Sleeper, PA-C  HYDROcodone-acetaminophen (NORCO/VICODIN) 5-325 MG tablet Take 1 tablet by mouth every 6 (six) hours as needed for moderate pain. Patient taking differently: Take 1 tablet by mouth daily as needed for moderate pain.  05/04/19  Yes Terald Sleeper, PA-C  Melatonin 10 MG CAPS Take 20 mg by mouth at bedtime.   Yes [provider]  montelukast (SINGULAIR) 10 MG tablet Take 1 tablet (10 mg total) by mouth at bedtime. Patient taking differently: Take 10 mg by mouth at bedtime as needed (for respiratory flares).  05/04/19  Yes Terald Sleeper, PA-C  Oxcarbazepine (TRILEPTAL) 300 MG tablet TAKE 1 TABLET BY MOUTH AT BEDTIME Patient taking differently: Take 300 mg by mouth at bedtime.  05/04/19  Yes Terald Sleeper, PA-C  pantoprazole (PROTONIX) 40 MG tablet Take 1 tablet (40 mg total) by mouth 2 (two) times daily before a meal. 04/17/19  Yes Carlis Stable, NP  pseudoephedrine (SUDAFED) 30 MG tablet Take 30 mg by mouth every 4 (four) hours as needed for congestion.   Yes [provider]  amoxicillin-clavulanate (AUGMENTIN) 875-125 MG tablet Take 1 tablet by mouth 2 (two) times daily. Patient not taking: Reported on 07/04/2019 06/04/19   Chevis Pretty, FNP  cyclobenzaprine (FLEXERIL) 10 MG tablet Take 1 tablet (10 mg total) by mouth 3 (three) times daily as needed for muscle spasms. Patient not taking: Reported on 07/04/2019 10/24/18   Terald Sleeper, PA-C  megestrol (MEGACE) 40 MG tablet 1 tablet daily Patient not taking: Reported on 07/04/2019 10/24/18   Terald Sleeper, PA-C  promethazine (PHENERGAN) 25 MG tablet Take 0.5 tablets (12.5 mg total) by mouth every 6 (six) hours as needed for nausea or vomiting. Patient not taking: Reported on 07/04/2019 04/17/19   Carlis Stable, NP  SUMAtriptan  (IMITREX) 50 MG tablet Take 1 tablet (50 mg total) by mouth once for 1 dose. May repeat ONCE in 2 hours if headache persists or recurs. Patient not taking: Reported on 07/04/2019 10/24/18 07/04/19  Terald Sleeper, PA-C  tizanidine (ZANAFLEX) 2 MG capsule Take 1 capsule (2 mg total) by mouth at bedtime and may repeat dose one time if needed. Patient not taking: Reported on 07/04/2019 10/24/18   Terald Sleeper, PA-C    Allergies    Tape, Aspirin,  and Imitrex [sumatriptan]  Review of Systems   Review of Systems  Constitutional: Positive for appetite change and fatigue. Negative for chills and fever.  Respiratory: Negative for cough, chest tightness and shortness of breath.   Cardiovascular: Negative for chest pain.  Gastrointestinal: Positive for abdominal pain and nausea. Negative for constipation, diarrhea and vomiting.  Genitourinary: Positive for vaginal bleeding (Started menstrual cycle yesterday.). Negative for dysuria, urgency and vaginal pain.  Musculoskeletal: Negative for back pain and neck pain.  Skin: Negative for color change and rash.  Neurological: Negative for weakness, numbness and headaches.  All other systems reviewed and are negative.   Physical Exam Updated Vital Signs BP 105/67   Pulse 82   Temp 99 F (37.2 C) (Oral)   Resp 16   Ht 5\' 3"  (1.6 m)   Wt 65.8 kg   LMP 07/04/2019   SpO2 100%   BMI 25.69 kg/m   Physical Exam Vitals and nursing note reviewed. Exam conducted with a chaperone present.  Constitutional:      General: She is not in acute distress.    Appearance: She is well-developed.  HENT:     Head: Normocephalic and atraumatic.  Eyes:     Conjunctiva/sclera: Conjunctivae normal.  Cardiovascular:     Rate and Rhythm: Normal rate and regular rhythm.     Heart sounds: No murmur.  Pulmonary:     Effort: Pulmonary effort is normal. No respiratory distress.     Breath sounds: Normal breath sounds.  Abdominal:     General: Abdomen is flat. Bowel sounds  are increased. There is no distension.     Palpations: Abdomen is soft.     Tenderness: There is abdominal tenderness in the right lower quadrant. There is guarding and rebound.     Hernia: No hernia is present.  Genitourinary:    Comments: Scattered lesions on external genitalia consistent with genital warts. Speculum exam shows moderate amount of blood in the vaginal canal with clear/yellow thin discharge from the cervix.  Strings consistent with IUD emerged from the cervix.  There is no significant adnexal tenderness, fullness, or cervical motion tenderness. Musculoskeletal:     Cervical back: Neck supple.  Skin:    General: Skin is warm and dry.  Neurological:     General: No focal deficit present.     Mental Status: She is alert.  Psychiatric:        Mood and Affect: Mood normal.        Behavior: Behavior normal.     ED Results / Procedures / Treatments   Labs (all labs ordered are listed, but only abnormal results are displayed) Labs Reviewed  WET PREP, GENITAL - Abnormal; Notable for the following components:      Result Value   Clue Cells Wet Prep HPF POC PRESENT (*)    WBC, Wet Prep HPF POC MANY (*)    All other components within normal limits  COMPREHENSIVE METABOLIC PANEL - Abnormal; Notable for the following components:   Sodium 134 (*)    All other components within normal limits  CBC - Abnormal; Notable for the following components:   WBC 19.8 (*)    All other components within normal limits  URINALYSIS, ROUTINE W REFLEX MICROSCOPIC - Abnormal; Notable for the following components:   Hgb urine dipstick LARGE (*)    Bacteria, UA RARE (*)    All other components within normal limits  SARS CORONAVIRUS 2 (TAT 6-24 HRS)  LIPASE, BLOOD  I-STAT BETA  BertoJasmiFrances FurbishProduction9[place]6 designer, theatre/television/filmg-[phone] OvensDeretha E8295Marland [phone]30-4Lorretta Harpa(903)[place]415- XTTAG>AG>XTTAG>G>XTTAG> >a [facility] Tobin ChadUc Health Amb[phone] [phone] Mo [phone] MoroRainelle66Ch[place]ney HillMariana [id number] Pollack [place]3Frances Furbish nsurance [phone] CenterMarland [phone] designer, theatre/television/filmJasmine PangBerton [phone] OvensDeretha [facility] Tobin ChadLake Butler Hospital [phone] [phone] Mo [phone] MoroCrestwood [place]63Woodland HillsMariana [id number] Pollack [place]70Frances Furbish Insurance [phone] LLCMarland [phone] designer, theatre/television/filmJasmine PangBerton [phone] OvensDeretha Em[facility] Tobin ChadGarfield County Health [phone] [phone] Mo [phone] MoroSunny [place]les [place]6FarleyMariana [id number] Pollack [place]55Frances Furbish Insurance [phone] LLCMarland [phone] designer, theatre/television/filmJasmine PangBerton [phone] OvensDeretha Emory  CMP without significant electrolyte derangements. Pelvic exam was performed showing clear/yellow thin discharge from the cervical opening.  IUD strings are present. CT was obtained due to concern for appendicitis showing normal appendix however there is evidence of a left-sided TOA and a right-sided possibly  hemorrhagic cyst which most likely created her right-sided abdominal pain. Given that she has significant leukocytosis of 19 I spoke with Dr. Shawnie Pons of OB/GYN.  Patient does not meet SIRS criteria. Patient will be seen by Dr. Shawnie Pons for admission.  Note: Portions of this report may have been transcribed using voice recognition software. Every effort was made to ensure accuracy; however, inadvertent computerized transcription errors may be present  The patient appears reasonably stabilized for admission considering the current resources, flow, and capabilities available in the ED at this time, and I doubt any other [facility] requiring further screening and/or treatment in the ED prior to admission assuming reasonable time to floor.    Final Clinical Impression(s) / ED Diagnoses Final diagnoses:  TOA (tubo-ovarian abscess)    Rx / DC Orders ED Discharge Orders    None       Norman Clay 07/04/19 2251    Linwood Dibbles, MD 07/05/19 1032

## 2019-07-04 NOTE — ED Notes (Signed)
Pelvic done by Lanora Manis - PA and Kenney Houseman - EMT assisted.

## 2019-07-04 NOTE — ED Notes (Signed)
OB at bedside

## 2019-07-04 NOTE — ED Triage Notes (Signed)
Patient c/o lower abdominal pain onset of yesterday. Denies any N/V/D or any urinary symptoms. Recently at [facility] and referred to GI but unable to see due to insurance issues.

## 2019-07-04 NOTE — ED Notes (Signed)
covid swab collected, labeled with 2 pt identifiers, and sent to lab 

## 2019-07-04 NOTE — ED Notes (Signed)
Pt ambulated to and from restroom without difficulty.   

## 2019-07-05 ENCOUNTER — Encounter: Payer: Self-pay | Admitting: Obstetrics and Gynecology

## 2019-07-05 LAB — GC/CHLAMYDIA PROBE AMP (~~LOC~~) NOT AT ARMC
Chlamydia: NEGATIVE
Comment: NEGATIVE
Comment: NORMAL
Neisseria Gonorrhea: NEGATIVE

## 2019-07-05 LAB — CBC WITH DIFFERENTIAL/PLATELET
Abs Immature Granulocytes: 0.06 10*3/uL (ref 0.00–0.07)
Basophils Absolute: 0.1 10*3/uL (ref 0.0–0.1)
Basophils Relative: 1 %
Eosinophils Absolute: 0.5 10*3/uL (ref 0.0–0.5)
Eosinophils Relative: 4 %
HCT: 37.1 % (ref 36.0–46.0)
Hemoglobin: 11.9 g/dL — ABNORMAL LOW (ref 12.0–15.0)
Immature Granulocytes: 0 %
Lymphocytes Relative: 8 %
Lymphs Abs: 1.2 10*3/uL (ref 0.7–4.0)
MCH: 29.6 pg (ref 26.0–34.0)
MCHC: 32.1 g/dL (ref 30.0–36.0)
MCV: 92.3 fL (ref 80.0–100.0)
Monocytes Absolute: 1.1 10*3/uL — ABNORMAL HIGH (ref 0.1–1.0)
Monocytes Relative: 8 %
Neutro Abs: 11.6 10*3/uL — ABNORMAL HIGH (ref 1.7–7.7)
Neutrophils Relative %: 79 %
Platelets: 237 10*3/uL (ref 150–400)
RBC: 4.02 MIL/uL (ref 3.87–5.11)
RDW: 13.2 % (ref 11.5–15.5)
WBC: 14.6 10*3/uL — ABNORMAL HIGH (ref 4.0–10.5)
nRBC: 0 % (ref 0.0–0.2)

## 2019-07-05 LAB — TYPE AND SCREEN
ABO/RH(D): O POS
Antibody Screen: NEGATIVE

## 2019-07-05 LAB — PROTIME-INR
INR: 1 (ref 0.8–1.2)
Prothrombin Time: 13.4 seconds (ref 11.4–15.2)

## 2019-07-05 LAB — SARS CORONAVIRUS 2 (TAT 6-24 HRS): SARS Coronavirus 2: NEGATIVE

## 2019-07-05 LAB — ABO/RH: ABO/RH(D): O POS

## 2019-07-05 LAB — APTT: aPTT: 34 seconds (ref 24–36)

## 2019-07-05 MED ORDER — ALBUTEROL SULFATE (2.5 MG/3ML) 0.083% IN NEBU: 2.5000 mg | INHALATION_SOLUTION | Freq: Four times a day (QID) | RESPIRATORY_TRACT | Status: DC | PRN

## 2019-07-05 NOTE — Progress Notes (Signed)
Gynecology Progress Note  Admission Date: 07/04/2019 Current Date: 07/05/2019 10:33 AM  Kimberly Terrell is a 44 y.o. [id number] HD#2 admitted for abdominal pain and then dx with left TOA   History complicated by: Patient Active Problem List   Diagnosis Date Noted  . TOA (tubo-ovarian abscess) 07/04/2019  . IUD infection ([place]) 07/04/2019  . Gastroesophageal reflux disease without esophagitis 05/04/2019  . H. pylori infection 03/28/2019  . Abnormal CT of the abdomen 03/28/2019  . Chronic daily headache 10/12/2017  . Allergic rhinitis 07/25/2017  . Depression, recurrent ([place]) 04/04/2017  . Migraine 04/04/2017  . Bursitis of right hip 04/04/2017  . GAD (generalized anxiety disorder) 04/04/2017  . DUB (dysfunctional uterine bleeding) 04/04/2017    ROS and patient/family/surgical history, located on admission H&P note dated 07/04/2019, have been reviewed, and there are no changes except as noted below Yesterday/Overnight Events:  none  Subjective:  Pain/discomfort is stable. No GI, fevers, chillls, urinary s/s.   Objective:    Current Vital Signs 24h Vital Sign Ranges  T 99 F (37.2 C) Temp  Avg: 98.8 F (37.1 C)  Min: 98.5 F (36.9 C)  Max: 99 F (37.2 C)  BP 117/68 BP  Min: 105/67  Max: 133/89  HR 82 Pulse  Avg: 87.4  Min: 75  Max: 104  RR 17 Resp  Avg: 16.3  Min: 14  Max: 18  SaO2 99 % Room Air SpO2  Avg: 98.2 %  Min: 97 %  Max: 100 %       24 Hour I/O Current Shift I/O  Time Ins Outs 04/07 0701 - 04/08 0700 In: 160  Out: -  No intake/output data recorded.   Patient Vitals for the past 24 hrs:  BP Temp Temp src Pulse Resp SpO2 Height Weight  07/05/19 0644 117/68 -- -- 82 17 99 % -- --  07/04/19 2300 120/74 -- -- 81 -- 97 % -- --  07/04/19 2255 119/67 -- -- 75 14 99 % -- --  07/04/19 2200 105/67 -- -- 82 -- 100 % -- --  07/04/19 2130 133/89 -- -- 92 -- 100 % -- --  07/04/19 2100 130/71 -- -- 86 -- 98 % -- --  07/04/19 2030 108/60 -- -- 91 -- 97 % -- --  07/04/19 2000  123/80 -- -- 85 -- 97 % -- --  07/04/19 1900 113/73 -- -- -- -- -- -- --  07/04/19 1652 129/73 99 F (37.2 C) Oral 96 16 98 % -- --  07/04/19 1237 -- -- -- -- -- -- 5\' 3"  (1.6 m) 65.8 kg  07/04/19 1213 116/65 98.5 F (36.9 C) Oral (!) 104 18 97 % -- --    Physical exam: General appearance: alert, cooperative and appears stated age Abdomen: soft, non-tender; bowel sounds normal; no masses,  no organomegaly Lungs: clear to auscultation bilaterally Heart: S1, S2 normal, no murmur, rub or gallop, regular rate and rhythm Extremities: no c/c/e Skin: warm and dry Psych: appropriate Neurologic: Grossly normal  Medications Current Facility-Administered Medications  Medication Dose Route Frequency Provider Last Rate Last Admin  . albuterol (VENTOLIN HFA) 108 (90 Base) MCG/ACT inhaler 2 puff  2 puff Inhalation Q6H PRN Donnamae Jude, MD      . ALPRAZolam Duanne Moron) tablet 1 mg  1 mg Oral BID PRN Donnamae Jude, MD   1 mg at 07/05/19 0848  . alum & mag hydroxide-simeth (MAALOX/MYLANTA) 200-200-20 MG/5ML suspension 30 mL  30 mL Oral Q4H PRN Darron Doom  S, MD      . Ampicillin-Sulbactam (UNASYN) 3 g in sodium chloride 0.9 % 100 mL IVPB  3 g Intravenous Q6H Reva Bores, MD   Stopped at 07/05/19 [phone]  . buPROPion (WELLBUTRIN XL) 24 hr tablet 150 mg  150 mg Oral Daily Reva Bores, MD   150 mg at 07/05/19 0939  . diclofenac sodium (VOLTAREN) 1 % transdermal gel 2 g  2 g Topical QID PRN Reva Bores, MD      . doxycycline (VIBRA-TABS) tablet 100 mg  100 mg Oral Q12H Reva Bores, MD   100 mg at 07/05/19 0940  . enoxaparin (LOVENOX) injection 40 mg  40 mg Subcutaneous Q24H Reva Bores, MD      . escitalopram (LEXAPRO) tablet 20 mg  20 mg Oral Daily Reva Bores, MD   20 mg at 07/05/19 0939  . fluticasone (FLONASE) 50 MCG/ACT nasal spray 2 spray  2 spray Each Nare Daily PRN Reva Bores, MD      . guaiFENesin (ROBITUSSIN) 100 MG/5ML solution 300 mg  15 mL Oral Q4H PRN Reva Bores, MD       . HYDROcodone-acetaminophen (NORCO/VICODIN) 5-325 MG per tablet 1-2 tablet  1-2 tablet Oral Q4H PRN Reva Bores, MD   2 tablet at 07/05/19 0849  . melatonin tablet 18 mg  18 mg Oral QHS Reva Bores, MD   18 mg at 07/05/19 0103  . menthol-cetylpyridinium (CEPACOL) lozenge 3 mg  1 lozenge Oral Q2H PRN Reva Bores, MD      . metoCLOPramide (REGLAN) injection 10 mg  10 mg Intravenous Q6H PRN Reva Bores, MD      . montelukast (SINGULAIR) tablet 10 mg  10 mg Oral QHS PRN Reva Bores, MD      . Oxcarbazepine (TRILEPTAL) tablet 300 mg  300 mg Oral QHS Reva Bores, MD   300 mg at 07/04/19 2301  . pantoprazole (PROTONIX) EC tablet 40 mg  40 mg Oral BID AC Reva Bores, MD   40 mg at 07/05/19 0720  . pseudoephedrine (SUDAFED) tablet 30 mg  30 mg Oral Q4H PRN Reva Bores, MD      . senna-docusate (Senokot-S) tablet 1 tablet  1 tablet Oral QHS PRN Reva Bores, MD       Current Outpatient Medications  Medication Sig Dispense Refill  . acetaminophen (TYLENOL) 500 MG tablet Take 500-1,000 mg by mouth every 6 (six) hours as needed for mild pain or headache.     . albuterol (PROVENTIL HFA;VENTOLIN HFA) 108 (90 Base) MCG/ACT inhaler Inhale 2 puffs into the lungs every 6 (six) hours as needed for wheezing or shortness of breath. 1 Inhaler 0  . ALPRAZolam (XANAX) 1 MG tablet Take 1 tablet (1 mg total) by mouth 3 (three) times daily. (Patient taking differently: Take 1 mg by mouth in the morning and at bedtime. ) 90 tablet 5  . buPROPion (WELLBUTRIN XL) 150 MG 24 hr tablet Take 1 tablet (150 mg total) by mouth daily. 30 tablet 11  . diclofenac sodium (VOLTAREN) 1 % GEL Apply 2 g topically 4 (four) times daily. (Patient taking differently: Apply 2 g topically 4 (four) times daily as needed (to painful sites). ) 300 g 1  . escitalopram (LEXAPRO) 20 MG tablet TAKE 1 TABLET BY MOUTH ONCE DAILY TAKE  WITH  A  10MG   ALSO (Patient taking differently: Take 20 mg by mouth daily. ) 45  tablet 5  .  fluticasone (FLONASE) 50 MCG/ACT nasal spray Place 2 sprays into both nostrils daily. (Patient taking differently: Place 2 sprays into both nostrils daily as needed for allergies or rhinitis. ) 16 g 6  . HYDROcodone-acetaminophen (NORCO/VICODIN) 5-325 MG tablet Take 1 tablet by mouth every 6 (six) hours as needed for moderate pain. (Patient taking differently: Take 1 tablet by mouth daily as needed for moderate pain. ) 60 tablet 0  . Melatonin 10 MG CAPS Take 20 mg by mouth at bedtime.    . montelukast (SINGULAIR) 10 MG tablet Take 1 tablet (10 mg total) by mouth at bedtime. (Patient taking differently: Take 10 mg by mouth at bedtime as needed (for respiratory flares). ) 90 tablet 1  . Oxcarbazepine (TRILEPTAL) 300 MG tablet TAKE 1 TABLET BY MOUTH AT BEDTIME (Patient taking differently: Take 300 mg by mouth at bedtime. ) 90 tablet 3  . pantoprazole (PROTONIX) 40 MG tablet Take 1 tablet (40 mg total) by mouth 2 (two) times daily before a meal. 180 tablet 2  . pseudoephedrine (SUDAFED) 30 MG tablet Take 30 mg by mouth every 4 (four) hours as needed for congestion.    Marland Kitchen amoxicillin-clavulanate (AUGMENTIN) 875-125 MG tablet Take 1 tablet by mouth 2 (two) times daily. (Patient not taking: Reported on 07/04/2019) 14 tablet 0  . cyclobenzaprine (FLEXERIL) 10 MG tablet Take 1 tablet (10 mg total) by mouth 3 (three) times daily as needed for muscle spasms. (Patient not taking: Reported on 07/04/2019) 30 tablet 1  . megestrol (MEGACE) 40 MG tablet 1 tablet daily (Patient not taking: Reported on 07/04/2019) 30 tablet 2  . promethazine (PHENERGAN) 25 MG tablet Take 0.5 tablets (12.5 mg total) by mouth every 6 (six) hours as needed for nausea or vomiting. (Patient not taking: Reported on 07/04/2019) 30 tablet 1  . SUMAtriptan (IMITREX) 50 MG tablet Take 1 tablet (50 mg total) by mouth once for 1 dose. May repeat ONCE in 2 hours if headache persists or recurs. (Patient not taking: Reported on 07/04/2019) 10 tablet 11  .  tizanidine (ZANAFLEX) 2 MG capsule Take 1 capsule (2 mg total) by mouth at bedtime and may repeat dose one time if needed. (Patient not taking: Reported on 07/04/2019) 30 capsule 2    Labs  Recent Labs  Lab 07/04/19 1239  WBC 19.8*  HGB 13.1  HCT 40.3  PLT 241    Recent Labs  Lab 07/04/19 1239  NA 134*  K 3.8  CL 100  CO2 23  BUN 7  CREATININE 0.72  CALCIUM 8.9  PROT 6.7  BILITOT 0.6  ALKPHOS 115  ALT 17  AST 18  GLUCOSE 93    Radiology No new imaging  Assessment & Plan:  Pt stable *GYN: repeat labs. D/w radiology and IR and they said they'd recommend IV abx first due to size and location what's seen on imaging. Continue IV abx for today. Consider transition to PO tomorrow *Pain: continue with PO PRNs *FEN/GI: regular diet *PPx: SCDs, OOB ad lib *Dispo: when afebrile on PO meds  Code Status: Full Code  Total time taking care of the patient was 15 minutes, with greater than 50% of the time spent in face to face interaction with the patient.  Cornelia Copa MD Attending Center for [facility] (Faculty Practice) GYN Consult Phone: [phone] (M-F, 0800-1700) & [phone] (Off hours, weekends, holidays)

## 2019-07-05 NOTE — ED Notes (Signed)
MS   Breakfast ordered  

## 2019-07-05 NOTE — ED Notes (Signed)
Lunch ordered 

## 2019-07-06 LAB — CBC WITH DIFFERENTIAL/PLATELET
Abs Immature Granulocytes: 0.06 10*3/uL (ref 0.00–0.07)
Basophils Absolute: 0.1 10*3/uL (ref 0.0–0.1)
Basophils Relative: 1 %
Eosinophils Absolute: 0.9 10*3/uL — ABNORMAL HIGH (ref 0.0–0.5)
Eosinophils Relative: 7 %
HCT: 36.5 % (ref 36.0–46.0)
Hemoglobin: 11.8 g/dL — ABNORMAL LOW (ref 12.0–15.0)
Immature Granulocytes: 0 %
Lymphocytes Relative: 14 %
Lymphs Abs: 1.9 10*3/uL (ref 0.7–4.0)
MCH: 29.6 pg (ref 26.0–34.0)
MCHC: 32.3 g/dL (ref 30.0–36.0)
MCV: 91.5 fL (ref 80.0–100.0)
Monocytes Absolute: 1.1 10*3/uL — ABNORMAL HIGH (ref 0.1–1.0)
Monocytes Relative: 8 %
Neutro Abs: 9.5 10*3/uL — ABNORMAL HIGH (ref 1.7–7.7)
Neutrophils Relative %: 70 %
Platelets: 231 10*3/uL (ref 150–400)
RBC: 3.99 MIL/uL (ref 3.87–5.11)
RDW: 13.3 % (ref 11.5–15.5)
WBC: 13.5 10*3/uL — ABNORMAL HIGH (ref 4.0–10.5)
nRBC: 0 % (ref 0.0–0.2)

## 2019-07-06 MED ORDER — AMOXICILLIN-POT CLAVULANATE 875-125 MG PO TABS: 1.0000 | ORAL_TABLET | Freq: Two times a day (BID) | ORAL | 0 refills | Status: AC

## 2019-07-06 MED ORDER — AMOXICILLIN-POT CLAVULANATE 875-125 MG PO TABS
1.0000 | ORAL_TABLET | Freq: Two times a day (BID) | ORAL | Status: DC
  Administered 2019-07-06: 1 via ORAL
  Filled 2019-07-06: qty 1

## 2019-07-06 MED ORDER — HYDROCODONE-ACETAMINOPHEN 5-325 MG PO TABS: 1.0000 | ORAL_TABLET | ORAL | 0 refills | Status: DC | PRN

## 2019-07-06 NOTE — Discharge Summary (Signed)
Discharge Summary   Admit Date: 07/04/2019 Discharge Date: 07/06/2019 Discharging Service: Gynecology  Primary OBGYN: None Admitting Physician: Donnamae Jude, MD  Discharge Physician: Ilda Basset  Referring Provider: [facility]  Primary Care Provider: Terald Sleeper, PA-C  Admission Diagnoses: RLQ pain Right sided TOA IUD in place  Discharge Diagnoses: Decreasing RLQ pain Right sided TOA  Consult Orders: IP CONSULT TO OB GYN   Surgeries/Procedures Performed: IUD removed by GYN in the ED  History and Physical: Patient admitted with the above. Patient started on Unasyn and Doxy and admitted  Hospital Course: Patient improved on IV abx regimen; IR consulted just to see if collection would be amenable to drainage and they said it's not that big and in a tricky location so recommended trying IV abx first. Patient transitioned to PO augmentin and was discharged to home on this. Wet prep and GC/CT negative CBC Latest Ref Rng & Units 07/06/2019 07/05/2019 07/04/2019  WBC 4.0 - 10.5 K/uL 13.5(H) 14.6(H) 19.8(H)  Hemoglobin 12.0 - 15.0 g/dL 11.8(L) 11.9(L) 13.1  Hematocrit 36.0 - 46.0 % 36.5 37.1 40.3  Platelets 150 - 400 K/uL 231 237 241     Discharge Exam:    Current Vital Signs 24h Vital Sign Ranges  T 98.9 F (37.2 C) Temp  Avg: 98.8 F (37.1 C)  Min: 98.6 F (37 C)  Max: 98.9 F (37.2 C)  BP 116/65 BP  Min: 114/61  Max: 132/84  HR 89 Pulse  Avg: 89.7  Min: 88  Max: 92  RR 18 Resp  Avg: 17  Min: 16  Max: 18  SaO2 97 % Room Air SpO2  Avg: 98.3 %  Min: 97 %  Max: 100 %       24 Hour I/O Current Shift I/O  Time Ins Outs 04/08 0701 - 04/09 0700 In: 999.9 [P.O.:600] Out: -  04/09 0701 - 04/09 1900 In: 540 [P.O.:540] Out: 0    General appearance: alert, cooperative and appears stated age Abdomen: soft, non-tender; bowel sounds normal; no masses,  no organomegaly Lungs: clear to auscultation bilaterally Heart: S1, S2 normal, no murmur, rub or gallop, regular rate and  rhythm Extremities: no c/c/e Skin: warm and dry Psych: appropriate Neurologic: Grossly normal  Discharge Disposition:  Home  Patient Instructions:  Standard   Results Pending at Discharge:  None  Discharge Medications: Allergies as of 07/06/2019      Reactions   Tape Other (See Comments)   The "plastic-like tape causes redness and irritation"   Aspirin Other (See Comments)   Caused chest pain   Imitrex [sumatriptan] Other (See Comments)   "Gave me lockjaw"      Medication List    STOP taking these medications   acetaminophen 500 MG tablet Commonly known as: TYLENOL   megestrol 40 MG tablet Commonly known as: MEGACE     TAKE these medications   albuterol 108 (90 Base) MCG/ACT inhaler Commonly known as: VENTOLIN HFA Inhale 2 puffs into the lungs every 6 (six) hours as needed for wheezing or shortness of breath.   ALPRAZolam 1 MG tablet Commonly known as: XANAX Take 1 tablet (1 mg total) by mouth 3 (three) times daily. What changed: when to take this   amoxicillin-clavulanate 875-125 MG tablet Commonly known as: AUGMENTIN Take 1 tablet by mouth every 12 (twelve) hours for 14 days. What changed: when to take this   buPROPion 150 MG 24 hr tablet Commonly known as: WELLBUTRIN XL Take 1 tablet (150 mg total)  by mouth daily.   cyclobenzaprine 10 MG tablet Commonly known as: FLEXERIL Take 1 tablet (10 mg total) by mouth 3 (three) times daily as needed for muscle spasms.   diclofenac sodium 1 % Gel Commonly known as: VOLTAREN Apply 2 g topically 4 (four) times daily. What changed:   when to take this  reasons to take this   escitalopram 20 MG tablet Commonly known as: LEXAPRO TAKE 1 TABLET BY MOUTH ONCE DAILY TAKE  WITH  A  10MG   ALSO What changed: See the new instructions.   fluticasone 50 MCG/ACT nasal spray Commonly known as: FLONASE Place 2 sprays into both nostrils daily. What changed:   when to take this  reasons to take this    HYDROcodone-acetaminophen 5-325 MG tablet Commonly known as: NORCO/VICODIN Take 1 tablet by mouth every 6 (six) hours as needed for moderate pain. What changed: when to take this   HYDROcodone-acetaminophen 5-325 MG tablet Commonly known as: NORCO/VICODIN Take 1-2 tablets by mouth every 4 (four) hours as needed for moderate pain. What changed: You were already taking a medication with the same name, and this prescription was added. Make sure you understand how and when to take each.   Melatonin 10 MG Caps Take 20 mg by mouth at bedtime.   montelukast 10 MG tablet Commonly known as: SINGULAIR Take 1 tablet (10 mg total) by mouth at bedtime. What changed:   when to take this  reasons to take this   Oxcarbazepine 300 MG tablet Commonly known as: TRILEPTAL TAKE 1 TABLET BY MOUTH AT BEDTIME What changed:   how much to take  how to take this  when to take this  additional instructions   pantoprazole 40 MG tablet Commonly known as: PROTONIX Take 1 tablet (40 mg total) by mouth 2 (two) times daily before a meal.   promethazine 25 MG tablet Commonly known as: PHENERGAN Take 0.5 tablets (12.5 mg total) by mouth every 6 (six) hours as needed for nausea or vomiting.   pseudoephedrine 30 MG tablet Commonly known as: SUDAFED Take 30 mg by mouth every 4 (four) hours as needed for congestion.   SUMAtriptan 50 MG tablet Commonly known as: Imitrex Take 1 tablet (50 mg total) by mouth once for 1 dose. May repeat ONCE in 2 hours if headache persists or recurs.   tizanidine 2 MG capsule Commonly known as: Zanaflex Take 1 capsule (2 mg total) by mouth at bedtime and may repeat dose one time if needed.        No future appointments.  Request sent for Tuesday appointment  Thursday MD Attending Center for [facility])

## 2019-07-06 NOTE — Progress Notes (Signed)
Gynecology Progress Note  Admission Date: 07/04/2019 Current Date: 07/06/2019 11:54 AM  Kimberly Terrell is a 44 y.o. [id number] HD#3 admitted for abdominal pain and then dx with left TOA   History complicated by: Patient Active Problem List   Diagnosis Date Noted  . TOA (tubo-ovarian abscess) 07/04/2019  . IUD infection ([facility]) 07/04/2019  . Gastroesophageal reflux disease without esophagitis 05/04/2019  . H. pylori infection 03/28/2019  . Abnormal CT of the abdomen 03/28/2019  . Chronic daily headache 10/12/2017  . Allergic rhinitis 07/25/2017  . Depression, recurrent ([facility]) 04/04/2017  . Migraine 04/04/2017  . Bursitis of right hip 04/04/2017  . GAD (generalized anxiety disorder) 04/04/2017  . DUB (dysfunctional uterine bleeding) 04/04/2017    ROS and patient/family/surgical history, located on admission H&P note dated 07/04/2019, have been reviewed, and there are no changes except as noted below Yesterday/Overnight Events:  none  Subjective:  Feeling much better. No constitutional s/s.   Objective:    Current Vital Signs 24h Vital Sign Ranges  T 98.9 F (37.2 C) Temp  Avg: 98.8 F (37.1 C)  Min: 98.6 F (37 C)  Max: 98.9 F (37.2 C)  BP 116/65 BP  Min: 114/61  Max: 132/84  HR 89 Pulse  Avg: 89.7  Min: 88  Max: 92  RR 18 Resp  Avg: 17  Min: 16  Max: 18  SaO2 97 % Room Air SpO2  Avg: 98.3 %  Min: 97 %  Max: 100 %       24 Hour I/O Current Shift I/O  Time Ins Outs 04/08 0701 - 04/09 0700 In: 999.9 [P.O.:600] Out: -  04/09 0701 - 04/09 1900 In: 540 [P.O.:540] Out: 0    Patient Vitals for the past 24 hrs:  BP Temp Temp src Pulse Resp SpO2 Weight  07/06/19 1016 -- -- -- -- 18 -- --  07/06/19 0558 116/65 98.9 F (37.2 C) Oral 89 17 97 % --  07/06/19 0500 -- -- -- -- -- -- 60.8 kg  07/05/19 2133 114/61 98.6 F (37 C) Oral 88 17 98 % --  07/05/19 1425 132/84 98.9 F (37.2 C) Oral 92 16 100 % --    Physical exam: General appearance: alert, cooperative and appears  stated age Abdomen: soft, non-tender; bowel sounds normal; no masses,  no organomegaly Lungs: clear to auscultation bilaterally Heart: S1, S2 normal, no murmur, rub or gallop, regular rate and rhythm Extremities: no c/c/e Skin: warm and dry Psych: appropriate Neurologic: Grossly normal  Medications Current Facility-Administered Medications  Medication Dose Route Frequency Provider Last Rate Last Admin  . albuterol (PROVENTIL) (2.5 MG/3ML) 0.083% nebulizer solution 2.5 mg  2.5 mg Nebulization Q6H PRN Lodema Hong A, RPH      . ALPRAZolam Prudy Feeler) tablet 1 mg  1 mg Oral BID PRN Reva Bores, MD   1 mg at 07/05/19 0848  . alum & mag hydroxide-simeth (MAALOX/MYLANTA) 200-200-20 MG/5ML suspension 30 mL  30 mL Oral Q4H PRN Reva Bores, MD      . amoxicillin-clavulanate (AUGMENTIN) 875-125 MG per tablet 1 tablet  1 tablet Oral Q12H Seaford Bing, MD      . buPROPion (WELLBUTRIN XL) 24 hr tablet 150 mg  150 mg Oral Daily Reva Bores, MD   150 mg at 07/06/19 0800  . diclofenac sodium (VOLTAREN) 1 % transdermal gel 2 g  2 g Topical QID PRN Reva Bores, MD      . enoxaparin (LOVENOX) injection 40 mg  40  mg Subcutaneous Q24H Donnamae Jude, MD      . escitalopram (LEXAPRO) tablet 20 mg  20 mg Oral Daily Donnamae Jude, MD   20 mg at 07/06/19 0800  . fluticasone (FLONASE) 50 MCG/ACT nasal spray 2 spray  2 spray Each Nare Daily PRN Donnamae Jude, MD      . guaiFENesin (ROBITUSSIN) 100 MG/5ML solution 300 mg  15 mL Oral Q4H PRN Donnamae Jude, MD      . HYDROcodone-acetaminophen (NORCO/VICODIN) 5-325 MG per tablet 1-2 tablet  1-2 tablet Oral Q4H PRN Donnamae Jude, MD   2 tablet at 07/05/19 2034  . melatonin tablet 18 mg  18 mg Oral QHS Donnamae Jude, MD   18 mg at 07/05/19 2209  . menthol-cetylpyridinium (CEPACOL) lozenge 3 mg  1 lozenge Oral Q2H PRN Donnamae Jude, MD      . metoCLOPramide (REGLAN) injection 10 mg  10 mg Intravenous Q6H PRN Donnamae Jude, MD      . montelukast  (SINGULAIR) tablet 10 mg  10 mg Oral QHS PRN Donnamae Jude, MD      . Oxcarbazepine (TRILEPTAL) tablet 300 mg  300 mg Oral QHS Donnamae Jude, MD   300 mg at 07/05/19 2208  . pantoprazole (PROTONIX) EC tablet 40 mg  40 mg Oral BID AC Donnamae Jude, MD   40 mg at 07/06/19 0800  . pseudoephedrine (SUDAFED) tablet 30 mg  30 mg Oral Q4H PRN Donnamae Jude, MD      . senna-docusate (Senokot-S) tablet 1 tablet  1 tablet Oral QHS PRN Donnamae Jude, MD        Labs  Recent Labs  Lab 07/04/19 1239 07/05/19 1033 07/06/19 0200  WBC 19.8* 14.6* 13.5*  HGB 13.1 11.9* 11.8*  HCT 40.3 37.1 36.5  PLT 241 237 231    Radiology No new imaging  Assessment & Plan:  Pt stable *GYN: continuing to improved. Switched to PO today. Pt strongly desires d/c to home today. Will keep until later this afternoon and if continuing to do well then okay for d/c to home with f/u early next week  *Pain: continue with PO PRNs *FEN/GI: regular diet *PPx: SCDs, OOB ad lib *Dispo: see above  Code Status: Full Code  Total time taking care of the patient was 15 minutes, with greater than 50% of the time spent in face to face interaction with the patient.  Durene Romans MD Attending Center for [place] (Faculty Practice) GYN Consult Phone: [phone] (M-F, 0800-1700) & [phone] (Off hours, weekends, holidays)

## 2019-07-06 NOTE — Final Progress Note (Signed)
PIV removed. AVS discussed with patient and husband, all questions and concerns answered. Awaiting 1700-1800 to discharge per MD's note. Pt tolerating PO antibiotics with no adverse side effects. Follow plan discussed with both, all questions and concerns answered. Discharging home-self care.

## 2019-07-06 NOTE — Progress Notes (Signed)
  July 06, 2019  Patient: Kimberly Terrell. [name]  Date of Birth: Jun 16, 1975  Date of Visit: 07/04/2019    To Whom It May Concern:  Kimberly Terrell was seen and treated in [facility] on 07/04/2019-07/06/2019. Lyda R. Purrington  may return to work on 07/11/2019.  Sincerely,  Cornelia Copa MD Attending Center for [employer] (Faculty Practice) 07/06/2019 Time: 1208 PM

## 2019-07-06 NOTE — Plan of Care (Signed)

## 2019-07-10 ENCOUNTER — Ambulatory Visit (INDEPENDENT_AMBULATORY_CARE_PROVIDER_SITE_OTHER): Admitting: Obstetrics & Gynecology

## 2019-07-10 ENCOUNTER — Encounter: Payer: Self-pay | Admitting: Obstetrics & Gynecology

## 2019-07-10 ENCOUNTER — Other Ambulatory Visit: Payer: Self-pay

## 2019-07-10 VITALS — BP 117/76 | HR 84 | Ht 65.0 in | Wt 139.5 lb

## 2019-07-10 DIAGNOSIS — N7093 Salpingitis and oophoritis, unspecified: Secondary | ICD-10-CM

## 2019-07-10 DIAGNOSIS — N946 Dysmenorrhea, unspecified: Secondary | ICD-10-CM

## 2019-07-10 DIAGNOSIS — N920 Excessive and frequent menstruation with regular cycle: Secondary | ICD-10-CM

## 2019-07-10 MED ORDER — MEGESTROL ACETATE 40 MG PO TABS: ORAL_TABLET | ORAL | 11 refills | Status: DC

## 2019-07-10 NOTE — Progress Notes (Signed)
Chief Complaint  Patient presents with  . Follow-up    on TOA      44 y.o. G2P1011 Patient's last menstrual period was 07/04/2019. The current method of family planning is vasectomy.  Outpatient Encounter Medications as of 07/10/2019  Medication Sig Note  . amoxicillin-clavulanate (AUGMENTIN) 875-125 MG tablet Take 1 tablet by mouth every 12 (twelve) hours for 14 days.   Marland Kitchen buPROPion (WELLBUTRIN XL) 150 MG 24 hr tablet Take 1 tablet (150 mg total) by mouth daily.   Marland Kitchen HYDROcodone-acetaminophen (NORCO/VICODIN) 5-325 MG tablet Take 1-2 tablets by mouth every 4 (four) hours as needed for moderate pain.   Marland Kitchen albuterol (PROVENTIL HFA;VENTOLIN HFA) 108 (90 Base) MCG/ACT inhaler Inhale 2 puffs into the lungs every 6 (six) hours as needed for wheezing or shortness of breath. (Patient not taking: Reported on 07/10/2019)   . ALPRAZolam (XANAX) 1 MG tablet Take 1 tablet (1 mg total) by mouth 3 (three) times daily. (Patient not taking: Reported on 07/10/2019)   . cyclobenzaprine (FLEXERIL) 10 MG tablet Take 1 tablet (10 mg total) by mouth 3 (three) times daily as needed for muscle spasms. (Patient not taking: Reported on 07/04/2019)   . diclofenac sodium (VOLTAREN) 1 % GEL Apply 2 g topically 4 (four) times daily. (Patient not taking: Reported on 07/10/2019)   . escitalopram (LEXAPRO) 20 MG tablet TAKE 1 TABLET BY MOUTH ONCE DAILY TAKE  WITH  A  10MG   ALSO (Patient not taking: No sig reported)   . fluticasone (FLONASE) 50 MCG/ACT nasal spray Place 2 sprays into both nostrils daily. (Patient not taking: Reported on 07/10/2019)   . HYDROcodone-acetaminophen (NORCO/VICODIN) 5-325 MG tablet Take 1 tablet by mouth every 6 (six) hours as needed for moderate pain. (Patient not taking: Reported on 07/10/2019) 07/04/2019: Not taken often   . megestrol (MEGACE) 40 MG tablet 3 tablets a day for 5 days, 2 tablets a day for 5 days then 1 tablet daily   . Melatonin 10 MG CAPS Take 20 mg by mouth at bedtime.   .  montelukast (SINGULAIR) 10 MG tablet Take 1 tablet (10 mg total) by mouth at bedtime. (Patient taking differently: Take 10 mg by mouth at bedtime as needed (for respiratory flares). )   . Oxcarbazepine (TRILEPTAL) 300 MG tablet TAKE 1 TABLET BY MOUTH AT BEDTIME (Patient not taking: Reported on 07/10/2019)   . pantoprazole (PROTONIX) 40 MG tablet Take 1 tablet (40 mg total) by mouth 2 (two) times daily before a meal. (Patient not taking: Reported on 07/10/2019)   . promethazine (PHENERGAN) 25 MG tablet Take 0.5 tablets (12.5 mg total) by mouth every 6 (six) hours as needed for nausea or vomiting. (Patient not taking: Reported on 07/10/2019)   . pseudoephedrine (SUDAFED) 30 MG tablet Take 30 mg by mouth every 4 (four) hours as needed for congestion.   . SUMAtriptan (IMITREX) 50 MG tablet Take 1 tablet (50 mg total) by mouth once for 1 dose. May repeat ONCE in 2 hours if headache persists or recurs. (Patient not taking: Reported on 07/04/2019)   . tizanidine (ZANAFLEX) 2 MG capsule Take 1 capsule (2 mg total) by mouth at bedtime and may repeat dose one time if needed. (Patient not taking: Reported on 07/04/2019)    No facility-administered encounter medications on file as of 07/10/2019.    Subjective Pt was admitted last week for left pyosalpinx/TOA with IUD in place IUD was removed Discharged on augmentin, her pain and symptoms are dramatically improved  No fever chills at present Pain level down from 8 to a 3  Does want to restart her megestrol for menorrhagia/dysmenorrhea control(I previously managed patient for this issue, see 04/2017 noed)   Past Medical History:  Diagnosis Date  . Anxiety   . Arthritis   . Asthma   . Bursitis    Right Hip and Right Knee  . Depression   . H. pylori infection    s/p triple therapy x 2  . Migraines     Past Surgical History:  Procedure Laterality Date  . COSMETIC SURGERY  2008   breast implants     OB History    Gravida  2   Para  1   Term  1    Preterm      AB  1   Living  1     SAB  1   TAB      Ectopic      Multiple      Live Births  1           Allergies  Allergen Reactions  . Tape Other (See Comments)    The "plastic-like tape causes redness and irritation"  . Aspirin Other (See Comments)    Caused chest pain  . Imitrex [Sumatriptan] Other (See Comments)    "Gave me lockjaw"    Social History   Socioeconomic History  . Marital status: Married    Spouse name: Nayleen Janosik  . Number of children: 1  . Years of education: Not on file  . Highest education level: Not on file  Occupational History  . Occupation: [employer]  . Smoking status: Current Every Day Smoker    Packs/day: 1.00    Types: Cigarettes  . Smokeless tobacco: Never Used  . Tobacco comment: Planning to quit 03/30/2019  Substance and Sexual Activity  . Alcohol use: No  . Drug use: No  . Sexual activity: Yes    Birth control/protection: I.U.D., Surgical    Comment: husband has had a vasectomy  Other Topics Concern  . Not on file  Social History Narrative   Right handed    Lives with [place]   Caffeine use: coffee-daily   Social Determinants of Health   Financial Resource Strain: Low Risk   . Difficulty of Paying Living Expenses: Not hard at all  Food Insecurity: No Food Insecurity  . Worried About Charity fundraiser in the Last Year: Never true  . Ran Out of Food in the Last Year: Never true  Transportation Needs: No Transportation Needs  . Lack of Transportation (Medical): No  . Lack of Transportation (Non-Medical): No  Physical Activity: Sufficiently Active  . Days of Exercise per Week: 3 days  . Minutes of Exercise per Session: 60 min  Stress: No Stress Concern Present  . Feeling of Stress : Not at all  Social Connections: Somewhat Isolated  . Frequency of Communication with Friends and Family: Once a week  . Frequency of Social Gatherings with Friends and Family: Once a week  . Attends Religious  Services: 1 to 4 times per year  . Active Member of Clubs or Organizations: No  . Attends Archivist Meetings: Never  . Marital Status: Married    Family History  Problem Relation Age of Onset  . Depression Mother   . Depression Daughter   . Colon cancer Neg Hx   . Esophageal cancer Neg Hx   . Gastric cancer Neg  Hx     Medications:       Current Outpatient Medications:  .  amoxicillin-clavulanate (AUGMENTIN) 875-125 MG tablet, Take 1 tablet by mouth every 12 (twelve) hours for 14 days., Disp: 28 tablet, Rfl: 0 .  buPROPion (WELLBUTRIN XL) 150 MG 24 hr tablet, Take 1 tablet (150 mg total) by mouth daily., Disp: 30 tablet, Rfl: 11 .  HYDROcodone-acetaminophen (NORCO/VICODIN) 5-325 MG tablet, Take 1-2 tablets by mouth every 4 (four) hours as needed for moderate pain., Disp: 14 tablet, Rfl: 0 .  albuterol (PROVENTIL HFA;VENTOLIN HFA) 108 (90 Base) MCG/ACT inhaler, Inhale 2 puffs into the lungs every 6 (six) hours as needed for wheezing or shortness of breath. (Patient not taking: Reported on 07/10/2019), Disp: 1 Inhaler, Rfl: 0 .  ALPRAZolam (XANAX) 1 MG tablet, Take 1 tablet (1 mg total) by mouth 3 (three) times daily. (Patient not taking: Reported on 07/10/2019), Disp: 90 tablet, Rfl: 5 .  cyclobenzaprine (FLEXERIL) 10 MG tablet, Take 1 tablet (10 mg total) by mouth 3 (three) times daily as needed for muscle spasms. (Patient not taking: Reported on 07/04/2019), Disp: 30 tablet, Rfl: 1 .  diclofenac sodium (VOLTAREN) 1 % GEL, Apply 2 g topically 4 (four) times daily. (Patient not taking: Reported on 07/10/2019), Disp: 300 g, Rfl: 1 .  escitalopram (LEXAPRO) 20 MG tablet, TAKE 1 TABLET BY MOUTH ONCE DAILY TAKE  WITH  A  10MG   ALSO (Patient not taking: No sig reported), Disp: 45 tablet, Rfl: 5 .  fluticasone (FLONASE) 50 MCG/ACT nasal spray, Place 2 sprays into both nostrils daily. (Patient not taking: Reported on 07/10/2019), Disp: 16 g, Rfl: 6 .  HYDROcodone-acetaminophen (NORCO/VICODIN)  5-325 MG tablet, Take 1 tablet by mouth every 6 (six) hours as needed for moderate pain. (Patient not taking: Reported on 07/10/2019), Disp: 60 tablet, Rfl: 0 .  megestrol (MEGACE) 40 MG tablet, 3 tablets a day for 5 days, 2 tablets a day for 5 days then 1 tablet daily, Disp: 30 tablet, Rfl: 11 .  Melatonin 10 MG CAPS, Take 20 mg by mouth at bedtime., Disp: , Rfl:  .  montelukast (SINGULAIR) 10 MG tablet, Take 1 tablet (10 mg total) by mouth at bedtime. (Patient taking differently: Take 10 mg by mouth at bedtime as needed (for respiratory flares). ), Disp: 90 tablet, Rfl: 1 .  Oxcarbazepine (TRILEPTAL) 300 MG tablet, TAKE 1 TABLET BY MOUTH AT BEDTIME (Patient not taking: Reported on 07/10/2019), Disp: 90 tablet, Rfl: 3 .  pantoprazole (PROTONIX) 40 MG tablet, Take 1 tablet (40 mg total) by mouth 2 (two) times daily before a meal. (Patient not taking: Reported on 07/10/2019), Disp: 180 tablet, Rfl: 2 .  promethazine (PHENERGAN) 25 MG tablet, Take 0.5 tablets (12.5 mg total) by mouth every 6 (six) hours as needed for nausea or vomiting. (Patient not taking: Reported on 07/10/2019), Disp: 30 tablet, Rfl: 1 .  pseudoephedrine (SUDAFED) 30 MG tablet, Take 30 mg by mouth every 4 (four) hours as needed for congestion., Disp: , Rfl:  .  SUMAtriptan (IMITREX) 50 MG tablet, Take 1 tablet (50 mg total) by mouth once for 1 dose. May repeat ONCE in 2 hours if headache persists or recurs. (Patient not taking: Reported on 07/04/2019), Disp: 10 tablet, Rfl: 11 .  tizanidine (ZANAFLEX) 2 MG capsule, Take 1 capsule (2 mg total) by mouth at bedtime and may repeat dose one time if needed. (Patient not taking: Reported on 07/04/2019), Disp: 30 capsule, Rfl: 2  Objective Blood pressure 117/76,  pulse 84, height 5\' 5"  (1.651 m), weight 139 lb 8 oz (63.3 kg), last menstrual period 07/04/2019.  Gen WDWN NAD Abdomen soft mild tenderness with movement  Pertinent ROS No burning with urination, frequency or urgency No nausea, vomiting  or diarrhea Nor fever chills or other constitutional symptoms   Labs or studies Reviewed notes, labs and scans from her IP care last week    Impression Diagnoses this Encounter::   ICD-10-CM   1. TOA (tubo-ovarian abscess)  N70.93   2. Dysmenorrhea  N94.6   3. Menorrhagia with regular cycle  N92.0     Established relevant diagnosis(es):   Plan/Recommendations: Meds ordered this encounter  Medications  . megestrol (MEGACE) 40 MG tablet    Sig: 3 tablets a day for 5 days, 2 tablets a day for 5 days then 1 tablet daily    Dispense:  30 tablet    Refill:  11    Labs or Scans Ordered: No orders of the defined types were placed in this encounter.   Management:: >Finish her 14 day course of augmentin for her pyosalpinx/TOA, IUD has been removed >restart megestrol as a modality to manage her dysmenorrhea/menorrhagia  Yearly 1/22  Follow up Return if symptoms worsen or fail to improve, for yearly 1/22 , with Dr 2/22.       Face to face time:  15 minutes  Greater than 50% of the visit time was spent in counseling and coordination of care with the patient.  The summary and outline of the counseling and care coordination is summarized in the note above.   All questions were answered.   All questions were answered.

## 2019-07-23 ENCOUNTER — Encounter: Payer: Self-pay | Admitting: Family Medicine

## 2019-07-23 ENCOUNTER — Ambulatory Visit (INDEPENDENT_AMBULATORY_CARE_PROVIDER_SITE_OTHER): Admitting: Family Medicine

## 2019-07-23 DIAGNOSIS — B3731 Acute candidiasis of vulva and vagina: Secondary | ICD-10-CM

## 2019-07-23 DIAGNOSIS — B373 Candidiasis of vulva and vagina: Secondary | ICD-10-CM

## 2019-07-23 MED ORDER — FLUCONAZOLE 150 MG PO TABS: 150.0000 mg | ORAL_TABLET | Freq: Every day | ORAL | 0 refills | Status: DC

## 2019-07-23 NOTE — Progress Notes (Signed)
Subjective:    Patient ID: Kimberly Terrell, female    DOB: 07/02/1975, 44 y.o.   MRN: [id number]   HPI: Kimberly Terrell is a 44 y.o. female presenting for yeast infection post antibiotics. Has had H. Pylori and recent hospitalization for infection in her pelvic area.  As result she has a yeast infection caused by all the antibiotics.  It is itching and burning.  Scant discharge.  No fever chills or sweats.  No nausea or vomiting or back pain/flank pain.   Depression screen [facility] 2/9 07/10/2019 05/04/2019 04/17/2018 12/22/2017 12/12/2017  Decreased Interest 0 1 1 0 1  Down, Depressed, Hopeless 0 1 0 0 0  PHQ - 2 Score 0 2 1 0 1  Altered sleeping 0 1 - - -  Tired, decreased energy 1 3 - - -  Change in appetite 0 0 - - -  Feeling bad or failure about yourself  0 0 - - -  Trouble concentrating 0 0 - - -  Moving slowly or fidgety/restless 0 0 - - -  Suicidal thoughts 0 0 - - -  PHQ-9 Score 1 6 - - -  Difficult doing work/chores - Somewhat difficult - - -     Relevant past medical, surgical, family and social history reviewed and updated as indicated.  Interim medical history since our last visit reviewed. Allergies and medications reviewed and updated.  ROS:  Review of Systems  Constitutional: Negative for fever.  HENT: Negative.  Negative for congestion.   Respiratory: Negative for shortness of breath.   Cardiovascular: Negative for chest pain.  Gastrointestinal: Negative for abdominal pain.     Social History   Tobacco Use  Smoking Status Current Every Day Smoker  . Packs/day: 1.00  . Types: Cigarettes  Smokeless Tobacco Never Used  Tobacco Comment   Planning to quit 03/30/2019       Objective:     Wt Readings from Last 3 Encounters:  07/10/19 139 lb 8 oz (63.3 kg)  07/06/19 134 lb 0.6 oz (60.8 kg)  05/04/19 134 lb (60.8 kg)     Exam deferred. Pt. Harboring due to COVID 19. Phone visit performed.   Assessment & Plan:   1. Yeast vaginitis     Meds ordered  this encounter  Medications  . fluconazole (DIFLUCAN) 150 MG tablet    Sig: Take 1 tablet (150 mg total) by mouth daily.    Dispense:  5 tablet    Refill:  0    No orders of the defined types were placed in this encounter.     Diagnoses and all orders for this visit:  Yeast vaginitis  Other orders -     fluconazole (DIFLUCAN) 150 MG tablet; Take 1 tablet (150 mg total) by mouth daily.    Virtual Visit via telephone Note  I discussed the limitations, risks, security and privacy concerns of performing an evaluation and management service by telephone and the availability of in person appointments. The patient was identified with two identifiers. Pt.expressed understanding and agreed to proceed. Pt. Is at home. Dr. Darlyn Read is in his office.  Follow Up Instructions:   I discussed the assessment and treatment plan with the patient. The patient was provided an opportunity to ask questions and all were answered. The patient agreed with the plan and demonstrated an understanding of the instructions.   The patient was advised to call back or seek an in-person evaluation if the symptoms worsen or if the condition  fails to improve as anticipated.   Total minutes including chart review and phone contact time: 8   Follow up plan: No follow-ups on file.  Claretta Fraise, MD [place]

## 2019-07-25 DIAGNOSIS — Z029 Encounter for administrative examinations, unspecified: Secondary | ICD-10-CM

## 2019-07-26 ENCOUNTER — Ambulatory Visit: Admitting: Nurse Practitioner

## 2019-08-01 DIAGNOSIS — Z029 Encounter for administrative examinations, unspecified: Secondary | ICD-10-CM

## 2019-08-13 ENCOUNTER — Ambulatory Visit: Admitting: Family

## 2019-08-20 ENCOUNTER — Telehealth: Payer: Self-pay | Admitting: Obstetrics & Gynecology

## 2019-08-20 NOTE — Telephone Encounter (Signed)
Pt informed of Dr. Forestine Chute recommendation. Advised to call us in 2 weeks to let us know how bleeding is responding. Patient agreeable. No other questions at this time.

## 2019-08-20 NOTE — Telephone Encounter (Signed)
Not clear on when she took and then stopped nd restarted megace, can I get ome clarification on those start/stop/start dates?  Thanks

## 2019-08-20 NOTE — Telephone Encounter (Signed)
Pt states has had severe bleeding for over a month. Wants a nurse to follow up

## 2019-08-20 NOTE — Telephone Encounter (Signed)
Pt reports that she has been bleeding since she got out of the hospital. She was taking megace 40 mg daily but stopped because she figured it wasn't working. Would like to know what Dr. Despina Hidden would like for her to do now.

## 2019-08-20 NOTE — Telephone Encounter (Signed)
Started on 4/13, last dose yesterday.

## 2019-08-20 NOTE — Telephone Encounter (Signed)
No megestrol for 2 weeks, then we will reassess based on her bleeding

## 2019-09-27 ENCOUNTER — Other Ambulatory Visit: Payer: Self-pay | Admitting: *Deleted

## 2019-09-27 DIAGNOSIS — F339 Major depressive disorder, recurrent, unspecified: Secondary | ICD-10-CM

## 2019-09-27 MED ORDER — BUPROPION HCL ER (XL) 150 MG PO TB24: 150.0000 mg | ORAL_TABLET | Freq: Every day | ORAL | 0 refills | Status: DC

## 2019-10-22 ENCOUNTER — Telehealth: Payer: Self-pay | Admitting: *Deleted

## 2019-10-22 DIAGNOSIS — F339 Major depressive disorder, recurrent, unspecified: Secondary | ICD-10-CM

## 2019-10-22 MED ORDER — BUPROPION HCL ER (XL) 150 MG PO TB24: 150.0000 mg | ORAL_TABLET | Freq: Every day | ORAL | 0 refills | Status: DC

## 2019-10-22 NOTE — Telephone Encounter (Signed)
Pt was former Kimberly Terrell pt-she has appt with you to establish care 11/14/19 at 1:10. She is almost out of her Welbutrin is it ok to refill till her appt?

## 2019-10-22 NOTE — Telephone Encounter (Signed)
Yes, please send it in

## 2019-10-22 NOTE — Telephone Encounter (Signed)
rx sent in and pt is aware. 

## 2019-10-23 ENCOUNTER — Encounter: Payer: Self-pay | Admitting: *Deleted

## 2019-10-30 ENCOUNTER — Ambulatory Visit (INDEPENDENT_AMBULATORY_CARE_PROVIDER_SITE_OTHER): Admitting: Family

## 2019-10-30 ENCOUNTER — Encounter: Payer: Self-pay | Admitting: Family

## 2019-10-30 ENCOUNTER — Other Ambulatory Visit: Payer: Self-pay

## 2019-10-30 VITALS — BP 122/79 | HR 88 | Temp 97.3°F | Ht 65.0 in | Wt 155.2 lb

## 2019-10-30 DIAGNOSIS — F1320 Sedative, hypnotic or anxiolytic dependence, uncomplicated: Secondary | ICD-10-CM

## 2019-10-30 DIAGNOSIS — M7071 Other bursitis of hip, right hip: Secondary | ICD-10-CM | POA: Diagnosis not present

## 2019-10-30 DIAGNOSIS — Z79899 Other long term (current) drug therapy: Secondary | ICD-10-CM

## 2019-10-30 DIAGNOSIS — G43701 Chronic migraine without aura, not intractable, with status migrainosus: Secondary | ICD-10-CM

## 2019-10-30 DIAGNOSIS — K219 Gastro-esophageal reflux disease without esophagitis: Secondary | ICD-10-CM | POA: Diagnosis not present

## 2019-10-30 DIAGNOSIS — F339 Major depressive disorder, recurrent, unspecified: Secondary | ICD-10-CM

## 2019-10-30 DIAGNOSIS — F411 Generalized anxiety disorder: Secondary | ICD-10-CM

## 2019-10-30 DIAGNOSIS — G8929 Other chronic pain: Secondary | ICD-10-CM | POA: Insufficient documentation

## 2019-10-30 DIAGNOSIS — M25559 Pain in unspecified hip: Secondary | ICD-10-CM | POA: Insufficient documentation

## 2019-10-30 DIAGNOSIS — J45909 Unspecified asthma, uncomplicated: Secondary | ICD-10-CM | POA: Insufficient documentation

## 2019-10-30 DIAGNOSIS — M25551 Pain in right hip: Secondary | ICD-10-CM

## 2019-10-30 DIAGNOSIS — J4530 Mild persistent asthma, uncomplicated: Secondary | ICD-10-CM

## 2019-10-30 MED ORDER — ALPRAZOLAM 1 MG PO TABS: 1.0000 mg | ORAL_TABLET | Freq: Three times a day (TID) | ORAL | 2 refills | Status: DC

## 2019-10-30 MED ORDER — DULOXETINE HCL 60 MG PO CPEP: 60.0000 mg | ORAL_CAPSULE | Freq: Every day | ORAL | 3 refills | Status: DC

## 2019-10-30 NOTE — Progress Notes (Signed)
Subjective:    Patient ID: Kimberly Terrell. Dobrowolski, female    DOB: 09/09/1975, 44 y.o.   MRN: [id number]  Chief Complaint  Patient presents with  . Medical Management of Chronic Issues    jones patient, needs lexapro 13m   . Shoulder Pain    left shoulder, no xray    Pt presents to the office today to establish care. She is followed by GI for GERD and hx H pylori.  Shoulder Pain  The pain is present in the right shoulder. This is a new problem. The current episode started more than 1 month ago. There has been a history of trauma. The problem occurs intermittently. The problem has been waxing and waning. The pain is at a severity of 8/10. The pain is moderate. Associated symptoms include joint locking and stiffness. Pertinent negatives include no joint swelling or limited range of motion. She has tried acetaminophen for the symptoms.  Anxiety Presents for follow-up visit. Symptoms include depressed mood, irritability, nervous/anxious behavior, panic and restlessness. Patient reports no shortness of breath. Symptoms occur occasionally. The severity of symptoms is moderate. The quality of sleep is good.   Her past medical history is significant for asthma.  Depression        This is a chronic problem.  The current episode started more than 1 year ago.   The onset quality is gradual.   Associated symptoms include restlessness, decreased interest and sad.  Associated symptoms include no helplessness and no hopelessness.  Past treatments include SSRIs - Selective serotonin reuptake inhibitors.  Compliance with treatment is good.  Past medical history includes anxiety.   Asthma She complains of cough. There is no shortness of breath or wheezing. This is a chronic problem. The current episode started more than 1 year ago. The problem has been waxing and waning. She reports moderate improvement on treatment. Her past medical history is significant for asthma.  Arthritis Presents for follow-up visit. She  complains of pain and stiffness. Affected locations include the right knee and right hip. Her pain is at a severity of 10/10.  Migraine  This is a chronic problem. The current episode started more than 1 year ago. The problem occurs daily. The pain is located in the bilateral region. The pain quality is similar to prior headaches. The quality of the pain is described as aching. Associated symptoms include coughing, phonophobia and photophobia. She has tried acetaminophen for the symptoms.      Review of Systems  Constitutional: Positive for irritability.  Eyes: Positive for photophobia.  Respiratory: Positive for cough. Negative for shortness of breath and wheezing.   Musculoskeletal: Positive for arthritis and stiffness.  Psychiatric/Behavioral: Positive for depression. The patient is nervous/anxious.   All other systems reviewed and are negative.      Objective:   Physical Exam Constitutional:      Appearance: Normal appearance.  HENT:     Right Ear: Tympanic membrane normal.     Left Ear: Tympanic membrane normal.     Nose: Nose normal.  Cardiovascular:     Rate and Rhythm: Tachycardia present.     Pulses: Normal pulses.  Pulmonary:     Effort: Pulmonary effort is normal.  Abdominal:     General: Bowel sounds are normal. There is no distension.     Palpations: There is no mass.  Musculoskeletal:        General: Normal range of motion.     Cervical back: Normal range of motion.  Comments: Pain with abduction of left shoulder   Skin:    General: Skin is warm.  Neurological:     General: No focal deficit present.     Mental Status: She is alert.  Psychiatric:        Mood and Affect: Mood normal.       BP 122/79   Pulse 88   Temp (!) 97.3 F (36.3 C) (Temporal)   Ht 5' 5"  (1.651 m)   Wt 155 lb 3.2 oz (70.4 kg)   SpO2 98%   BMI 25.83 kg/m      Assessment & Plan:  Braelynne R. Palladino comes in today with chief complaint of Medical Management of Chronic Issues  (jones patient, needs lexapro 47m ) and Shoulder Pain (left shoulder, no xray )   Diagnosis and orders addressed:  1. Bursitis of right hip, unspecified bursa - CMP14+EGFR - CBC with Differential/Platelet - Ambulatory referral to Orthopedic Surgery  2. GAD (generalized anxiety disorder) Will stop lexapro and start Cymbalta. She will continue to take Lexapro 20 mg for next 2-3 days, then decrease to 10 mg for 5-7 days then stop. Will decrease xanax to #75 a month per patient as what she takes a month - ALPRAZolam (XANAX) 1 MG tablet; Take 1 tablet (1 mg total) by mouth 3 (three) times daily.  Dispense: 75 tablet; Refill: 2 - DULoxetine (CYMBALTA) 60 MG capsule; Take 1 capsule (60 mg total) by mouth daily.  Dispense: 90 capsule; Refill: 3 - CMP14+EGFR - CBC with Differential/Platelet  3. Gastroesophageal reflux disease without esophagitis - CMP14+EGFR - CBC with Differential/Platelet  4. Depression, recurrent ([place] - DULoxetine (CYMBALTA) 60 MG capsule; Take 1 capsule (60 mg total) by mouth daily.  Dispense: 90 capsule; Refill: 3 - CMP14+EGFR - CBC with Differential/Platelet  5. Mild persistent asthma without complication - CHOZ22+QMGN- CBC with Differential/Platelet  6. Chronic pain of right hip - CMP14+EGFR - CBC with Differential/Platelet - Ambulatory referral to Orthopedic Surgery  7. Chronic migraine without aura with status migrainosus, not intractable - CMP14+EGFR - CBC with Differential/Platelet - Ambulatory referral to Neurology  8. Controlled substance agreement signed - ToxASSURE Select 13 (MW), Urine - CMP14+EGFR - CBC with Differential/Platelet  9. Benzodiazepine dependence ([place] - ToxASSURE Select 13 (MW), Urine - CMP14+EGFR - CBC with Differential/Platelet   Labs pending Patient reviewed in [place] controlled database, no flags noted. Contract and drug screen up dated today. We will stop Norco today. We have also decreased Xanax to #75 per patient as what  she takes a month.  Health Maintenance reviewed Diet and exercise encouraged  Follow up plan: 3 months   CEvelina Dun FNP

## 2019-10-30 NOTE — Patient Instructions (Signed)

## 2019-10-31 LAB — CBC WITH DIFFERENTIAL/PLATELET
Basophils Absolute: 0.1 10*3/uL (ref 0.0–0.2)
Basos: 1 %
EOS (ABSOLUTE): 0.4 10*3/uL (ref 0.0–0.4)
Eos: 3 %
Hematocrit: 37.3 % (ref 34.0–46.6)
Hemoglobin: 12.6 g/dL (ref 11.1–15.9)
Immature Grans (Abs): 0.1 10*3/uL (ref 0.0–0.1)
Immature Granulocytes: 1 %
Lymphocytes Absolute: 2.5 10*3/uL (ref 0.7–3.1)
Lymphs: 18 %
MCH: 29 pg (ref 26.6–33.0)
MCHC: 33.8 g/dL (ref 31.5–35.7)
MCV: 86 fL (ref 79–97)
Monocytes Absolute: 0.8 10*3/uL (ref 0.1–0.9)
Monocytes: 6 %
Neutrophils Absolute: 10 10*3/uL — ABNORMAL HIGH (ref 1.4–7.0)
Neutrophils: 71 %
Platelets: 250 10*3/uL (ref 150–450)
RBC: 4.34 x10E6/uL (ref 3.77–5.28)
RDW: 13 % (ref 11.7–15.4)
WBC: 13.9 10*3/uL — ABNORMAL HIGH (ref 3.4–10.8)

## 2019-10-31 LAB — CMP14+EGFR
ALT: 14 IU/L (ref 0–32)
AST: 13 IU/L (ref 0–40)
Albumin/Globulin Ratio: 2.4 — ABNORMAL HIGH (ref 1.2–2.2)
Albumin: 4.7 g/dL (ref 3.8–4.8)
Alkaline Phosphatase: 74 IU/L (ref 48–121)
BUN/Creatinine Ratio: 9 (ref 9–23)
BUN: 7 mg/dL (ref 6–24)
Bilirubin Total: <0.2 mg/dL (ref 0.0–1.2)
CO2: 22 mmol/L (ref 20–29)
Calcium: 9.3 mg/dL (ref 8.7–10.2)
Chloride: 104 mmol/L (ref 96–106)
Creatinine, Ser: 0.76 mg/dL (ref 0.57–1.00)
GFR calc Af Amer: 111 mL/min/{1.73_m2} (ref >59)
GFR calc non Af Amer: 96 mL/min/{1.73_m2} (ref >59)
Globulin, Total: 2 g/dL (ref 1.5–4.5)
Glucose: 91 mg/dL (ref 65–99)
Potassium: 4.2 mmol/L (ref 3.5–5.2)
Sodium: 141 mmol/L (ref 134–144)
Total Protein: 6.7 g/dL (ref 6.0–8.5)

## 2019-11-01 ENCOUNTER — Telehealth: Payer: Self-pay | Admitting: Family

## 2019-11-01 ENCOUNTER — Other Ambulatory Visit: Payer: Self-pay | Admitting: Family

## 2019-11-01 DIAGNOSIS — D72829 Elevated white blood cell count, unspecified: Secondary | ICD-10-CM

## 2019-11-01 LAB — TOXASSURE SELECT 13 (MW), URINE

## 2019-11-01 NOTE — Telephone Encounter (Signed)
Pt informed of results and aware of referral to hematologist

## 2019-11-14 ENCOUNTER — Ambulatory Visit: Admitting: Family Medicine

## 2019-11-19 ENCOUNTER — Telehealth: Payer: Self-pay | Admitting: Family

## 2019-11-19 DIAGNOSIS — R059 Cough, unspecified: Secondary | ICD-10-CM

## 2019-11-19 MED ORDER — ALBUTEROL SULFATE HFA 108 (90 BASE) MCG/ACT IN AERS: 2.0000 | INHALATION_SPRAY | Freq: Four times a day (QID) | RESPIRATORY_TRACT | 2 refills | Status: DC | PRN

## 2019-11-19 NOTE — Telephone Encounter (Signed)
Prescription sent to pharmacy.

## 2019-11-19 NOTE — Telephone Encounter (Signed)
°  Prescription Request  11/19/2019  What is the name of the medication or equipment? Albbuterol 108 90 base . Had appt 8-3 and wasn't called in  Have you contacted your pharmacy to request a refill? (if applicable) NO  Which pharmacy would you like this sent to? [facility]   Patient notified that their request is being sent to the clinical staff for review and that they should receive a response within 2 business days.

## 2019-11-21 ENCOUNTER — Encounter: Payer: Self-pay | Admitting: Orthopaedic Surgery

## 2019-11-21 ENCOUNTER — Ambulatory Visit (INDEPENDENT_AMBULATORY_CARE_PROVIDER_SITE_OTHER): Admitting: Orthopaedic Surgery

## 2019-11-21 ENCOUNTER — Other Ambulatory Visit: Payer: Self-pay

## 2019-11-21 VITALS — Ht 65.0 in | Wt 155.0 lb

## 2019-11-21 DIAGNOSIS — M7061 Trochanteric bursitis, right hip: Secondary | ICD-10-CM

## 2019-11-21 MED ORDER — LIDOCAINE HCL 1 % IJ SOLN
2.0000 mL | INTRAMUSCULAR | Status: AC | PRN
  Administered 2019-11-21: 2 mL

## 2019-11-21 MED ORDER — BUPIVACAINE HCL 0.5 % IJ SOLN
2.0000 mL | INTRAMUSCULAR | Status: AC | PRN
  Administered 2019-11-21: 2 mL via INTRA_ARTICULAR

## 2019-11-21 MED ORDER — METHYLPREDNISOLONE ACETATE 40 MG/ML IJ SUSP
80.0000 mg | INTRAMUSCULAR | Status: AC | PRN
  Administered 2019-11-21: 80 mg via INTRA_ARTICULAR

## 2019-11-21 NOTE — Progress Notes (Signed)
Office Visit Note   Patient: Kimberly Terrell. [name]           Date of Birth: [birth year]           MRN: [id number] Visit Date: 11/21/2019              Requested by: [name], FNP [address] [place],  [place] [address] PCP: [name], FNP   Assessment & Plan: Visit Diagnoses:  1. Trochanteric bursitis of right hip     Plan: Recurrent pain in the area of the right greater trochanter similar to the pain that she had 2 years ago that responded nicely to her local cortisone injection.  No recent history of injury or trauma or repetitive activity.  Based on the good response she had several years ago will reinject the greater trochanter and have her return as needed.  Consider further diagnostic evaluation with MRI scan of the pelvis if no improvement  Follow-Up Instructions: Return if symptoms worsen or fail to improve.   Orders:  Orders Placed This Encounter  Procedures  . Large Joint Inj: R greater trochanter   No orders of the defined types were placed in this encounter.     Procedures: Large Joint Inj: R greater trochanter on 11/21/2019 11:54 AM Indications: pain and diagnostic evaluation Details: 25 G 1.5 in needle  Arthrogram: No  Medications: 2 mL lidocaine 1 %; 2 mL bupivacaine 0.5 %; 80 mg methylPREDNISolone acetate 40 MG/ML Procedure, treatment alternatives, risks and benefits explained, specific risks discussed. Consent was given by the patient. Immediately prior to procedure a time out was called to verify the correct patient, procedure, equipment, support staff and site/side marked as required. Patient was prepped and draped in the usual sterile fashion.       Clinical Data: No additional findings.   Subjective: Chief Complaint  Patient presents with  . Right Hip - Pain  Patient presents today for right hip pain. No known injury. She said that it has been hurting for a couple years. The pain is located laterally. No groin pain and no pain  radiates down her leg. She said that the pain is constant, but worse with any movement at all. No numbness, tingling, or weakness in her leg. She has been taking hydrocodone as prescribed by her PCP Dr.Angel Jones. She is scared of getting addicted to the pain medicine and wants a fix to her hip pain. She was here two years and received a troch bursa injection with Dr.Whitffield. She said that it helped, but cannot remember for how long.  Films of the lumbar spine obtained 2 years ago revealed minimal left-sided scoliosis between L4 and the sacrum.  There was mild facet degeneration at L4-5 and L5-S1.  No listhesis.  Has not specifically had back pain and no suggested radiculopathy   HPI  Review of Systems   Objective: Vital Signs: Ht 5\' 5"  (1.651 m)   Wt 155 lb (70.3 kg)   BMI 25.79 kg/m   Physical Exam Constitutional:      Appearance: She is well-developed.  Eyes:     Pupils: Pupils are equal, round, and reactive to light.  Pulmonary:     Effort: Pulmonary effort is normal.  Skin:    General: Skin is warm and dry.  Neurological:     Mental Status: She is alert and oriented to person, place, and time.  Psychiatric:        Behavior: Behavior normal.  Ortho Exam awake alert and oriented x3.  Comfortable sitting tenderness over the tip of the greater trochanter right hip.  No skin changes.  Painless range of motion of right hip with internal and external rotation.  No related groin pain.  Straight leg raise negative.  No pain over the lower lumbar spine or SI joints.  Neurologically intact  Specialty Comments:  No specialty comments available.  Imaging: No results found.   PMFS History: Patient Active Problem List   Diagnosis Date Noted  . Asthma 10/30/2019  . Chronic hip pain 10/30/2019  . TOA (tubo-ovarian abscess) 07/04/2019  . IUD infection (HCC) 07/04/2019  . Gastroesophageal reflux disease without esophagitis 05/04/2019  . H. pylori infection 03/28/2019  .  Abnormal CT of the abdomen 03/28/2019  . Chronic daily headache 10/12/2017  . Allergic rhinitis 07/25/2017  . Depression, recurrent (HCC) 04/04/2017  . Migraine 04/04/2017  . Bursitis of right hip 04/04/2017  . GAD (generalized anxiety disorder) 04/04/2017  . DUB (dysfunctional uterine bleeding) 04/04/2017   Past Medical History:  Diagnosis Date  . Anxiety   . Arthritis   . Asthma   . Bursitis    Right Hip and Right Knee  . Depression   . H. pylori infection    s/p triple therapy x 2  . Migraines     Family History  Problem Relation Age of Onset  . Depression Mother   . Depression Daughter   . Colon cancer Neg Hx   . Esophageal cancer Neg Hx   . Gastric cancer Neg Hx     Past Surgical History:  Procedure Laterality Date  . COSMETIC SURGERY  2008   breast implants    Social History   Occupational History  . Occupation: [employer]  . Smoking status: Current Every Day Smoker    Packs/day: 1.00    Types: Cigarettes  . Smokeless tobacco: Never Used  . Tobacco comment: Planning to quit 03/30/2019  Vaping Use  . Vaping Use: Never used  Substance and Sexual Activity  . Alcohol use: No  . Drug use: No  . Sexual activity: Yes    Birth control/protection: I.U.D., Surgical    Comment: husband has had a vasectomy

## 2019-11-22 ENCOUNTER — Ambulatory Visit (HOSPITAL_COMMUNITY): Admitting: Hematology

## 2019-12-26 ENCOUNTER — Emergency Department (HOSPITAL_COMMUNITY)

## 2019-12-26 ENCOUNTER — Emergency Department (HOSPITAL_COMMUNITY)
Admission: EM | Admit: 2019-12-26 | Discharge: 2019-12-26 | Disposition: A | Discharge status: Home / Self Care | Attending: Emergency Medicine | Admitting: Emergency Medicine

## 2019-12-26 ENCOUNTER — Encounter (HOSPITAL_COMMUNITY): Payer: Self-pay | Admitting: Emergency Medicine

## 2019-12-26 ENCOUNTER — Other Ambulatory Visit: Payer: Self-pay

## 2019-12-26 DIAGNOSIS — R002 Palpitations: Secondary | ICD-10-CM | POA: Insufficient documentation

## 2019-12-26 DIAGNOSIS — F17210 Nicotine dependence, cigarettes, uncomplicated: Secondary | ICD-10-CM | POA: Insufficient documentation

## 2019-12-26 DIAGNOSIS — Z79899 Other long term (current) drug therapy: Secondary | ICD-10-CM | POA: Diagnosis not present

## 2019-12-26 DIAGNOSIS — J45909 Unspecified asthma, uncomplicated: Secondary | ICD-10-CM | POA: Insufficient documentation

## 2019-12-26 DIAGNOSIS — Z20822 Contact with and (suspected) exposure to covid-19: Secondary | ICD-10-CM | POA: Diagnosis not present

## 2019-12-26 LAB — I-STAT BETA HCG BLOOD, ED (MC, WL, AP ONLY): I-stat hCG, quantitative: <5 m[IU]/mL (ref <5)

## 2019-12-26 LAB — RAPID URINE DRUG SCREEN, HOSP PERFORMED
Amphetamines: NOT DETECTED
Barbiturates: NOT DETECTED
Benzodiazepines: POSITIVE — AB
Cocaine: NOT DETECTED
Opiates: NOT DETECTED
Tetrahydrocannabinol: NOT DETECTED

## 2019-12-26 LAB — COMPREHENSIVE METABOLIC PANEL
ALT: 15 U/L (ref 0–44)
AST: 14 U/L — ABNORMAL LOW (ref 15–41)
Albumin: 4 g/dL (ref 3.5–5.0)
Alkaline Phosphatase: 63 U/L (ref 38–126)
Anion gap: 8 (ref 5–15)
BUN: 8 mg/dL (ref 6–20)
CO2: 21 mmol/L — ABNORMAL LOW (ref 22–32)
Calcium: 8.6 mg/dL — ABNORMAL LOW (ref 8.9–10.3)
Chloride: 110 mmol/L (ref 98–111)
Creatinine, Ser: 0.65 mg/dL (ref 0.44–1.00)
GFR calc Af Amer: >60 mL/min (ref >60)
GFR calc non Af Amer: >60 mL/min (ref >60)
Glucose, Bld: 89 mg/dL (ref 70–99)
Potassium: 3.3 mmol/L — ABNORMAL LOW (ref 3.5–5.1)
Sodium: 139 mmol/L (ref 135–145)
Total Bilirubin: 0.5 mg/dL (ref 0.3–1.2)
Total Protein: 6.5 g/dL (ref 6.5–8.1)

## 2019-12-26 LAB — CBC WITH DIFFERENTIAL/PLATELET
Abs Immature Granulocytes: 0.06 10*3/uL (ref 0.00–0.07)
Basophils Absolute: 0.2 10*3/uL — ABNORMAL HIGH (ref 0.0–0.1)
Basophils Relative: 1 %
Eosinophils Absolute: 0.6 10*3/uL — ABNORMAL HIGH (ref 0.0–0.5)
Eosinophils Relative: 5 %
HCT: 40.1 % (ref 36.0–46.0)
Hemoglobin: 13.2 g/dL (ref 12.0–15.0)
Immature Granulocytes: 1 %
Lymphocytes Relative: 20 %
Lymphs Abs: 2.6 10*3/uL (ref 0.7–4.0)
MCH: 29.9 pg (ref 26.0–34.0)
MCHC: 32.9 g/dL (ref 30.0–36.0)
MCV: 90.9 fL (ref 80.0–100.0)
Monocytes Absolute: 0.8 10*3/uL (ref 0.1–1.0)
Monocytes Relative: 7 %
Neutro Abs: 8.3 10*3/uL — ABNORMAL HIGH (ref 1.7–7.7)
Neutrophils Relative %: 66 %
Platelets: 257 10*3/uL (ref 150–400)
RBC: 4.41 MIL/uL (ref 3.87–5.11)
RDW: 13.9 % (ref 11.5–15.5)
WBC: 12.5 10*3/uL — ABNORMAL HIGH (ref 4.0–10.5)
nRBC: 0 % (ref 0.0–0.2)

## 2019-12-26 LAB — MAGNESIUM: Magnesium: 2.1 mg/dL (ref 1.7–2.4)

## 2019-12-26 LAB — RESPIRATORY PANEL BY RT PCR (FLU A&B, COVID)
Influenza A by PCR: NEGATIVE
Influenza B by PCR: NEGATIVE
SARS Coronavirus 2 by RT PCR: NEGATIVE

## 2019-12-26 MED ORDER — LORAZEPAM 2 MG/ML IJ SOLN
0.5000 mg | Freq: Once | INTRAMUSCULAR | Status: AC
  Administered 2019-12-26: 0.5 mg via INTRAVENOUS
  Filled 2019-12-26: qty 1

## 2019-12-26 MED ORDER — POTASSIUM CHLORIDE CRYS ER 20 MEQ PO TBCR
40.0000 meq | EXTENDED_RELEASE_TABLET | Freq: Once | ORAL | Status: AC
  Administered 2019-12-26: 40 meq via ORAL
  Filled 2019-12-26: qty 2

## 2019-12-26 MED ORDER — METOPROLOL SUCCINATE ER 25 MG PO TB24: 25.0000 mg | ORAL_TABLET | Freq: Every day | ORAL | 1 refills | Status: DC

## 2019-12-26 MED ORDER — METOPROLOL TARTRATE 25 MG PO TABS
12.5000 mg | ORAL_TABLET | Freq: Once | ORAL | Status: AC
  Administered 2019-12-26: 12.5 mg via ORAL
  Filled 2019-12-26: qty 1

## 2019-12-26 MED ORDER — POTASSIUM CHLORIDE CRYS ER 20 MEQ PO TBCR: 20.0000 meq | EXTENDED_RELEASE_TABLET | Freq: Two times a day (BID) | ORAL | 0 refills | Status: DC

## 2019-12-26 MED ORDER — SODIUM CHLORIDE 0.9 % IV BOLUS
1000.0000 mL | Freq: Once | INTRAVENOUS | Status: AC
  Administered 2019-12-26: 1000 mL via INTRAVENOUS

## 2019-12-26 NOTE — ED Triage Notes (Signed)
Pt was at home and felt tightness in her throat and chest, HR 170 on watch.   EMS arrived and on monitor saw SVT

## 2019-12-26 NOTE — ED Provider Notes (Signed)
[facility] EMERGENCY DEPARTMENT Provider Note   CSN: [id number] Arrival date & time: 12/26/19  1528     History Chief Complaint  Patient presents with  . Tachycardia    Kimberly Terrell is a 44 y.o. female.  HPI She presents for evaluation of palpitations.  Transferred by EMS, who found her heart rate to be transiently 170.  They did not additionally intervene.  No history of similar.  She had her Covid vaccine, maternal, 1 week ago.  She is due to start a new job tomorrow.  She admits to being nervous.  She denies fever, chills, cough, shortness of breath, weakness or dizziness.  No recent illnesses.  No other changes.  There are no other known modifying factors.    Past Medical History:  Diagnosis Date  . Anxiety   . Arthritis   . Asthma   . Bursitis    Right Hip and Right Knee  . Depression   . H. pylori infection    s/p triple therapy x 2  . Migraines     Patient Active Problem List   Diagnosis Date Noted  . Asthma 10/30/2019  . Chronic hip pain 10/30/2019  . TOA (tubo-ovarian abscess) 07/04/2019  . IUD infection (HCC) 07/04/2019  . Gastroesophageal reflux disease without esophagitis 05/04/2019  . H. pylori infection 03/28/2019  . Abnormal CT of the abdomen 03/28/2019  . Chronic daily headache 10/12/2017  . Allergic rhinitis 07/25/2017  . Depression, recurrent (HCC) 04/04/2017  . Migraine 04/04/2017  . Bursitis of right hip 04/04/2017  . GAD (generalized anxiety disorder) 04/04/2017  . DUB (dysfunctional uterine bleeding) 04/04/2017    Past Surgical History:  Procedure Laterality Date  . COSMETIC SURGERY  2008   breast implants      OB History    Gravida  2   Para  1   Term  1   Preterm      AB  1   Living  1     SAB  1   TAB      Ectopic      Multiple      Live Births  1           Family History  Problem Relation Age of Onset  . Depression Mother   . Depression Daughter   . Colon cancer Neg Hx   . Esophageal cancer Neg  Hx   . Gastric cancer Neg Hx     Social History   Tobacco Use  . Smoking status: Current Every Day Smoker    Packs/day: 1.00    Types: Cigarettes  . Smokeless tobacco: Never Used  . Tobacco comment: Planning to quit 03/30/2019  Vaping Use  . Vaping Use: Never used  Substance Use Topics  . Alcohol use: No  . Drug use: No    Home Medications Prior to Admission medications   Medication Sig Start Date End Date Taking? Authorizing Provider  albuterol (VENTOLIN HFA) 108 (90 Base) MCG/ACT inhaler Inhale 2 puffs into the lungs every 6 (six) hours as needed for wheezing or shortness of breath. 11/19/19  Yes Hawks, Christy A, FNP  ALPRAZolam (XANAX) 1 MG tablet Take 1 tablet (1 mg total) by mouth 3 (three) times daily. 10/30/19  Yes Hawks, Christy A, FNP  buPROPion (WELLBUTRIN XL) 150 MG 24 hr tablet Take 1 tablet (150 mg total) by mouth daily. Needs appointment for further refills. 10/22/19  Yes Stacks, Broadus John, MD  DULoxetine (CYMBALTA) 60 MG capsule Take 1 capsule (  60 mg total) by mouth daily. 10/30/19  Yes Hawks, Christy A, FNP  Melatonin 10 MG CAPS Take 20 mg by mouth at bedtime.   Yes [provider]  montelukast (SINGULAIR) 10 MG tablet Take 1 tablet (10 mg total) by mouth at bedtime. Patient taking differently: Take 10 mg by mouth at bedtime as needed (for respiratory flares).  05/04/19  Yes Remus Loffler, PA-C  Oxcarbazepine (TRILEPTAL) 300 MG tablet TAKE 1 TABLET BY MOUTH AT BEDTIME 05/04/19  Yes Remus Loffler, PA-C  pantoprazole (PROTONIX) 40 MG tablet Take 1 tablet (40 mg total) by mouth 2 (two) times daily before a meal. 04/17/19  Yes Anice Paganini, NP  pseudoephedrine (SUDAFED) 30 MG tablet Take 30 mg by mouth every 4 (four) hours as needed for congestion.   Yes [provider]  cyclobenzaprine (FLEXERIL) 10 MG tablet Take 1 tablet (10 mg total) by mouth 3 (three) times daily as needed for muscle spasms. Patient not taking: Reported on 12/26/2019 10/24/18   Remus Loffler,  PA-C  diclofenac sodium (VOLTAREN) 1 % GEL Apply 2 g topically 4 (four) times daily. Patient not taking: Reported on 12/26/2019 06/27/18   Bennie Pierini, FNP  metoprolol succinate (TOPROL-XL) 25 MG 24 hr tablet Take 1 tablet (25 mg total) by mouth daily. 12/26/19   Mancel Bale, MD  potassium chloride SA (KLOR-CON) 20 MEQ tablet Take 1 tablet (20 mEq total) by mouth 2 (two) times daily. 12/26/19   Mancel Bale, MD  SUMAtriptan (IMITREX) 50 MG tablet Take 1 tablet (50 mg total) by mouth once for 1 dose. May repeat ONCE in 2 hours if headache persists or recurs. Patient not taking: Reported on 12/26/2019 10/24/18 07/04/19  Remus Loffler, PA-C    Allergies    Tape, Aspirin, and Imitrex [sumatriptan]  Review of Systems   Review of Systems  All other systems reviewed and are negative.   Physical Exam Updated Vital Signs BP 120/84   Pulse 84   Temp 98 F (36.7 C)   Resp (!) 21   Ht  (1.651 m)   Wt 68 kg   SpO2 99%   BMI 24.96 kg/m   Physical Exam Vitals and nursing note reviewed.  Constitutional:      General: She is not in acute distress.    Appearance: She is well-developed. She is not ill-appearing or toxic-appearing.  HENT:     Head: Normocephalic and atraumatic.     Right Ear: External ear normal.     Left Ear: External ear normal.     Nose: Nose normal.     Mouth/Throat:     Mouth: Mucous membranes are moist.     Pharynx: No posterior oropharyngeal erythema.  Eyes:     Conjunctiva/sclera: Conjunctivae normal.     Pupils: Pupils are equal, round, and reactive to light.  Neck:     Trachea: Phonation normal.     Comments: No thyromegaly or tenderness in the thyroid region. Cardiovascular:     Rate and Rhythm: Regular rhythm. Tachycardia present.     Heart sounds: Normal heart sounds. No murmur heard.  No friction rub.  Pulmonary:     Effort: Pulmonary effort is normal.     Breath sounds: Normal breath sounds.  Abdominal:     General: There is no  distension.     Palpations: Abdomen is soft. There is no mass.     Tenderness: There is no abdominal tenderness.  Musculoskeletal:  General: Normal range of motion.     Cervical back: Normal range of motion and neck supple.     Right lower leg: No edema.     Left lower leg: No edema.  Skin:    General: Skin is warm and dry.  Neurological:     Mental Status: She is alert and oriented to person, place, and time.     Cranial Nerves: No cranial nerve deficit.     Sensory: No sensory deficit.     Motor: No abnormal muscle tone.     Coordination: Coordination normal.  Psychiatric:        Mood and Affect: Mood normal.        Behavior: Behavior normal.        Thought Content: Thought content normal.        Judgment: Judgment normal.     ED Results / Procedures / Treatments   Labs (all labs ordered are listed, but only abnormal results are displayed) Labs Reviewed  COMPREHENSIVE METABOLIC PANEL - Abnormal; Notable for the following components:      Result Value   Potassium 3.3 (*)    CO2 21 (*)    Calcium 8.6 (*)    AST 14 (*)    All other components within normal limits  CBC WITH DIFFERENTIAL/PLATELET - Abnormal; Notable for the following components:   WBC 12.5 (*)    Neutro Abs 8.3 (*)    Eosinophils Absolute 0.6 (*)    Basophils Absolute 0.2 (*)    All other components within normal limits  RAPID URINE DRUG SCREEN, HOSP PERFORMED - Abnormal; Notable for the following components:   Benzodiazepines POSITIVE (*)    All other components within normal limits  RESPIRATORY PANEL BY RT PCR (FLU A&B, COVID)  MAGNESIUM  I-STAT BETA HCG BLOOD, ED ([facility], [facility], [facility] ONLY)    EKG EKG Interpretation  Date/Time:  Wednesday December 26 2019 15:41:15 EDT Ventricular Rate:  109 PR Interval:    QRS Duration: 80 QT Interval:  326 QTC Calculation: 439 R Axis:   101 Text Interpretation: Sinus tachycardia LAE, consider biatrial enlargement Right axis deviation Consider left ventricular  hypertrophy No old tracing to compare Confirmed by Mancel Bale [phone]) on 12/26/2019 3:58:24 PM   EKG Interpretation  Date/Time:  Wednesday December 26 2019 15:44:22 EDT Ventricular Rate:  141 PR Interval:    QRS Duration: 79 QT Interval:  336 QTC Calculation: 465 R Axis:   101 Text Interpretation: Sinus tachycardia Multiform ventricular premature complexes LAE, consider biatrial enlargement Right axis deviation Consider left ventricular hypertrophy Since last tracing rate faster with nonspecific tachy-arrhythymia which is intermittant Confirmed by Mancel Bale [phone]) on 12/26/2019 5:23:10 PM        Radiology DG Chest Port 1 View  Result Date: 12/26/2019 CLINICAL DATA:  Palpitations, chest tightness EXAM: PORTABLE CHEST 1 VIEW COMPARISON:  03/24/2011 FINDINGS: Heart and mediastinal contours are within normal limits. No focal opacities or effusions. No acute bony abnormality. IMPRESSION: No active disease. Electronically Signed   By: Charlett Nose M.D.   On: 12/26/2019 16:41    Procedures .Critical Care Performed by: Mancel Bale, MD Authorized by: Mancel Bale, MD   Critical care provider statement:    Critical care time (minutes):  45   Critical care start time:  12/26/2019 3:50 PM   Critical care end time:  12/26/2019 6:28 PM   Critical care time was exclusive of:  Separately billable procedures and treating other patients   Critical care was  necessary to treat or prevent imminent or life-threatening deterioration of the following conditions:  Cardiac failure   Critical care was time spent personally by me on the following activities:  Blood draw for specimens, development of treatment plan with patient or surrogate, discussions with consultants, evaluation of patient's response to treatment, examination of patient, obtaining history from patient or surrogate, ordering and performing treatments and interventions, ordering and review of laboratory studies, pulse oximetry,  re-evaluation of patient's condition, review of old charts and ordering and review of radiographic studies   (including critical care time)  Medications Ordered in ED Medications  sodium chloride 0.9 % bolus 1,000 mL (0 mLs Intravenous Stopped 12/26/19 1654)  LORazepam (ATIVAN) injection 0.5 mg (0.5 mg Intravenous Given 12/26/19 1603)  potassium chloride SA (KLOR-CON) CR tablet 40 mEq (40 mEq Oral Given 12/26/19 1804)  metoprolol tartrate (LOPRESSOR) tablet 12.5 mg (12.5 mg Oral Given 12/26/19 1831)    ED Course  I have reviewed the triage vital signs and the nursing notes.  Pertinent labs & imaging results that were available during my care of the patient were reviewed by me and considered in my medical decision making (see chart for details).  Clinical Course as of Dec 25 1833  Wed Dec 26, 2019  1629 No edema or infiltrate, interpreted by me  DG Chest [facility] [EW]  [phone] I discussed case with cardiology, Dr. Catalina Gravel; we reviewed the EKGs together.  He feels that this is a rate related arrhythmia, with P waves present and left bundle pattern.  He recommends discharge with metoprolol 25 XL, daily, and he will message the electrophysiology providers to arrange for follow-up care.   [EW]    Clinical Course User Index [EW] Mancel Bale, MD   MDM Rules/Calculators/A&P                           Patient Vitals for the past 24 hrs:  BP Temp Pulse Resp SpO2 Height Weight  12/26/19 1831 120/84 -- 84 -- -- -- --  12/26/19 1800 115/84 -- 85 (!) 21 99 % -- --  12/26/19 1730 114/78 -- 88 19 99 % -- --  12/26/19 1700 115/66 -- 100 20 99 % -- --  12/26/19 1650 (!) 152/86 -- (!) 110 20 100 % -- --  12/26/19 1645 -- -- 85 (!) 26 100 % -- --  12/26/19 1615 -- -- 96 19 98 % -- --  12/26/19 1600 129/80 -- (!) 106 (!) 23 100 % -- --  12/26/19 1541 -- 98 F (36.7 C) (!) 113 20 98 % -- --  12/26/19 1536 -- -- -- -- -- 5\' 5"  (1.651 m) 68 kg    6:21 PM Reevaluation with update and  discussion. After initial assessment and treatment, an updated evaluation reveals heart rate currently 85, sinus on the monitor, and she is comfortable.  Findings discussed with patient, and husband, all questions answered. Mancel Bale   Medical Decision Making:  This patient is presenting for evaluation of palpitations, which does require a range of treatment options, and is a complaint that involves a moderate risk of morbidity and mortality. The differential diagnoses include tachycardia, sinus irregularity, pelvic instability, acute illness thyrotoxic headache. I decided to review old records, and in summary previously healthy patient without cardiac history presenting with palpitations, intermittently.  I did not require additional historical information from anyone.  Clinical Laboratory Tests Ordered, included CBC, Metabolic panel, Urinalysis and  Urine drug screen, Covid test. Review indicates normal findings except mild white blood cell count elevation and slightly low potassium.. Radiologic Tests Ordered, included chest x-ray.  I independently Visualized: Radiographic images, which show no CHF or infiltrate  Cardiac Monitor Tracing which shows normal sinus rhythm with periods of tachyarrhythmia.  Critical Interventions-clinical evaluation, laboratory testing, IV fluids, IV Ativan, observation and  reassessment.  Discussion with cardiology, arrangements made for medication treatment, and follow-up care by electrophysiology.  After These Interventions, the Patient was reevaluated and was found stable for discharge.  Patient with intermittent tachyarrhythmia associated with rapid heart rate, which has improved with treatment in the ED.  Exact etiology and rhythm is not clear.  She requires close observation follow-up with electrophysiology.  Patient has not had any syncopal episodes.  No evidence for coronary ischemia.  CRITICAL CARE-yes Performed by: Mancel Bale  Nursing Notes  Reviewed/ Care Coordinated Applicable Imaging Reviewed Interpretation of Laboratory Data incorporated into ED treatment  The patient appears reasonably screened and/or stabilized for discharge and I doubt any other medical condition or other [facility] requiring further screening, evaluation, or treatment in the ED at this time prior to discharge.  Plan: Home Medications-continue usual; Home Treatments-drink plenty of fluids; return here if the recommended treatment, does not improve the symptoms; Recommended follow up-electrophysiology follow-up with soon as possible     Final Clinical Impression(s) / ED Diagnoses Final diagnoses:  Palpitations    Rx / DC Orders ED Discharge Orders         Ordered    metoprolol succinate (TOPROL-XL) 25 MG 24 hr tablet  Daily        12/26/19 1831    potassium chloride SA (KLOR-CON) 20 MEQ tablet  2 times daily        12/26/19 1831           Mancel Bale, MD 12/26/19 1835

## 2019-12-26 NOTE — Discharge Instructions (Signed)
Call the number for [facility], to get an appointment with electrophysiologist to be seen for evaluation of the palpitations.  Tell them that Dr. Eldridge Dace, sent a message about getting an appointment.  Start the prescription, metoprolol, tomorrow morning around 9 AM.  We are also prescribing potassium to make sure that your potassium level stays normal.  Also, make sure you are drinking plenty of water to stay well-hydrated and keep your heart rate lower.

## 2019-12-28 ENCOUNTER — Other Ambulatory Visit: Payer: Self-pay | Admitting: Family Medicine

## 2019-12-28 DIAGNOSIS — F339 Major depressive disorder, recurrent, unspecified: Secondary | ICD-10-CM

## 2020-01-16 ENCOUNTER — Ambulatory Visit (INDEPENDENT_AMBULATORY_CARE_PROVIDER_SITE_OTHER): Admitting: Internal Medicine

## 2020-01-16 ENCOUNTER — Encounter: Payer: Self-pay | Admitting: Internal Medicine

## 2020-01-16 ENCOUNTER — Other Ambulatory Visit: Payer: Self-pay

## 2020-01-16 VITALS — BP 124/84 | HR 100 | Ht 65.0 in | Wt 159.4 lb

## 2020-01-16 DIAGNOSIS — I493 Ventricular premature depolarization: Secondary | ICD-10-CM | POA: Diagnosis not present

## 2020-01-16 MED ORDER — METOPROLOL SUCCINATE ER 25 MG PO TB24
25.0000 mg | ORAL_TABLET | Freq: Every day | ORAL | 1 refills | Status: DC
Start: 2020-01-16 — End: 2020-01-31

## 2020-01-16 NOTE — Patient Instructions (Addendum)
Medication Instructions:  Hold Metoprolol while wearing heart monitor *If you need a refill on your cardiac medications before your next appointment, please call your pharmacy*   Lab Work: none If you have labs (blood work) drawn today and your tests are completely normal, you will receive your results only by: Marland Kitchen MyChart Message (if you have MyChart) OR . A paper copy in the mail If you have any lab test that is abnormal or we need to change your treatment, we will call you to review the results.   Testing/Procedures: Please schedule Echo Your physician has requested that you have an echocardiogram. Echocardiography is a painless test that uses sound waves to create images of your heart. It provides your doctor with information about the size and shape of your heart and how well your heart's chambers and valves are working. This procedure takes approximately one hour. There are no restrictions for this procedure.  Your physician has recommended that you wear a heart monitor. Holter monitors are medical devices that record the heart's electrical activity. Doctors most often use these monitors to diagnose arrhythmias. Arrhythmias are problems with the speed or rhythm of the heartbeat. The monitor is a small, portable device. You can wear one while you do your normal daily activities. This is usually used to diagnose what is causing palpitations/syncope (passing out).    Follow-Up: At [facility], you and your health needs are our priority.  As part of our continuing mission to provide you with exceptional heart care, we have created designated Provider Care Teams.  These Care Teams include your primary Cardiologist (physician) and Advanced Practice Providers (APPs -  Physician Assistants and Nurse Practitioners) who all work together to provide you with the care you need, when you need it.  We recommend signing up for the patient portal called "MyChart".  Sign up information is provided on  this After Visit Summary.  MyChart is used to connect with patients for Virtual Visits (Telemedicine).  Patients are able to view lab/test results, encounter notes, upcoming appointments, etc.  Non-urgent messages can be sent to your provider as well.   To learn more about what you can do with MyChart, go to [email or web address].    Your next appointment:   8 week(s)  The format for your next appointment:   In Person  Provider:   Dr. Johney Frame    Other Instructions ZIO XT- Long Term Monitor Instructions   Your physician has requested you wear your ZIO patch monitor___14____days.   This is a single patch monitor.  [employer] supplies one patch monitor per enrollment.  Additional stickers are not available.   Please do not apply patch if you will be having a Nuclear Stress Test, Echocardiogram, Cardiac CT, MRI, or Chest Xray during the time frame you would be wearing the monitor. The patch cannot be worn during these tests.  You cannot remove and re-apply the ZIO XT patch monitor.   Your ZIO patch monitor will be sent USPS Priority mail from [facility] directly to your home address. The monitor may also be mailed to a PO BOX if home delivery is not available.   It may take 3-5 days to receive your monitor after you have been enrolled.   Once you have received you monitor, please review enclosed instructions.  Your monitor has already been registered assigning a specific monitor serial # to you.   Applying the monitor   Shave hair from upper left chest.   Hold abrader disc by orange  tab.  Rub abrader in 40 strokes over left upper chest as indicated in your monitor instructions.   Clean area with 4 enclosed alcohol pads .  Use all pads to assure are is cleaned thoroughly.  Let dry.   Apply patch as indicated in monitor instructions.  Patch will be place under collarbone on left side of chest with arrow pointing upward.   Rub patch adhesive wings for 2 minutes.Remove white  label marked "1".  Remove white label marked "2".  Rub patch adhesive wings for 2 additional minutes.   While looking in a mirror, press and release button in center of patch.  A small green light will flash 3-4 times .  This will be your only indicator the monitor has been turned on.     Do not shower for the first 24 hours.  You may shower after the first 24 hours.   Press button if you feel a symptom. You will hear a small click.  Record Date, Time and Symptom in the Patient Log Book.   When you are ready to remove patch, follow instructions on last 2 pages of Patient Log Book.  Stick patch monitor onto last page of Patient Log Book.   Place Patient Log Book in [place] box.  Use locking tab on box and tape box closed securely.  The Orange and [employer] has [employer] on it.  Please place in mailbox as soon as possible.  Your physician should have your test results approximately 7 days after the monitor has been mailed back to [facility].   Call [facility] Customer Care at [phone] if you have questions regarding your ZIO XT patch monitor.  Call them immediately if you see an orange light blinking on your monitor.   If your monitor falls off in less than 4 days contact our Monitor department at [phone].  If your monitor becomes loose or falls off after 4 days call [employer] at [phone] for suggestions on securing your monitor.   zio

## 2020-01-16 NOTE — Progress Notes (Signed)
Electrophysiology Office Note   Date:  01/16/2020   ID:  Kimberly Terrell, Kimberly Terrell April 18, 1975, MRN [id number]  PCP:  [name], FNP    Primary Electrophysiologist: Hillis Range, MD    CC: palpitations   History of Present Illness: Kimberly Terrell is a 44 y.o. female who presents today for electrophysiology evaluation.   The patient is referred for evaluation of palpitations. She reports having intermittent palpitations for over a year.  She describes rapid heart beats with palpitations into her throat.  She reports mild SOB and rare associated dizziness. She was evaluated at [facility] ED 12/26/19 and was found to have (as seen on ekg) LBB inferior axis nonsustained VT. She was started on metoprolol. Today, she denies symptoms of  chest pain,  orthopnea, PND, lower extremity edema, presyncope, syncope, bleeding, or neurologic sequela. The patient is tolerating medications without difficulties and is otherwise without complaint today.    Past Medical History:  Diagnosis Date  . Anxiety   . Arthritis   . Asthma   . Bursitis    Right Hip and Right Knee  . Depression   . H. pylori infection    s/p triple therapy x 2  . Migraines    Past Surgical History:  Procedure Laterality Date  . COSMETIC SURGERY  2008   breast implants      Current Outpatient Medications  Medication Sig Dispense Refill  . albuterol (VENTOLIN HFA) 108 (90 Base) MCG/ACT inhaler Inhale 2 puffs into the lungs every 6 (six) hours as needed for wheezing or shortness of breath. 18 g 2  . ALPRAZolam (XANAX) 1 MG tablet Take 1 tablet (1 mg total) by mouth 3 (three) times daily. 75 tablet 2  . buPROPion (WELLBUTRIN XL) 150 MG 24 hr tablet Take 1 tablet (150 mg total) by mouth daily. 30 tablet 0  . cyclobenzaprine (FLEXERIL) 10 MG tablet Take 1 tablet (10 mg total) by mouth 3 (three) times daily as needed for muscle spasms. 30 tablet 1  . diclofenac sodium (VOLTAREN) 1 % GEL Apply 2 g topically 4 (four)  times daily. 300 g 1  . DULoxetine (CYMBALTA) 60 MG capsule Take 1 capsule (60 mg total) by mouth daily. 90 capsule 3  . Melatonin 10 MG CAPS Take 20 mg by mouth at bedtime.    . metoprolol succinate (TOPROL-XL) 25 MG 24 hr tablet Take 1 tablet (25 mg total) by mouth daily. 30 tablet 1  . montelukast (SINGULAIR) 10 MG tablet Take 1 tablet (10 mg total) by mouth at bedtime. (Patient taking differently: Take 10 mg by mouth at bedtime as needed (for respiratory flares). ) 90 tablet 1  . Oxcarbazepine (TRILEPTAL) 300 MG tablet TAKE 1 TABLET BY MOUTH AT BEDTIME 90 tablet 3  . pantoprazole (PROTONIX) 40 MG tablet Take 1 tablet (40 mg total) by mouth 2 (two) times daily before a meal. 180 tablet 2  . potassium chloride SA (KLOR-CON) 20 MEQ tablet Take 1 tablet (20 mEq total) by mouth 2 (two) times daily. 15 tablet 0  . pseudoephedrine (SUDAFED) 30 MG tablet Take 30 mg by mouth every 4 (four) hours as needed for congestion.    . SUMAtriptan (IMITREX) 50 MG tablet Take 1 tablet (50 mg total) by mouth once for 1 dose. May repeat ONCE in 2 hours if headache persists or recurs. (Patient not taking: Reported on 12/26/2019) 10 tablet 11   No current facility-administered medications for this visit.    Allergies:  Tape, Aspirin, and Imitrex [sumatriptan]   Social History:  The patient  reports that she has been smoking cigarettes. She has been smoking about 1.00 pack per day. She has never used smokeless tobacco. She reports that she does not drink alcohol and does not use drugs.   Family History:  The patient's family history includes Depression in her daughter and mother.    ROS:  Please see the history of present illness.   All other systems are personally reviewed and negative.    PHYSICAL EXAM: VS:  BP 124/84   Pulse 100   Ht 5\' 5"  (1.651 m)   Wt 159 lb 6.4 oz (72.3 kg)   SpO2 97%   BMI 26.53 kg/m  , BMI Body mass index is 26.53 kg/m. GEN: Well nourished, well developed, in no acute  distress HEENT: normal Neck: no JVD, carotid bruits, or masses Cardiac: RRR; no murmurs, rubs, or gallops,no edema  Respiratory:  clear to auscultation bilaterally, normal work of breathing GI: soft, nontender, nondistended, + BS MS: no deformity or atrophy Skin: warm and dry  Neuro:  Strength and sensation are intact Psych: euthymic mood, full affect  EKG:  EKG is ordered today. The ekg ordered today is personally reviewed and shows sinus rhythm,   Recent Labs: 05/04/2019: TSH 0.998 12/26/2019: ALT 15; BUN 8; Creatinine, Ser 0.65; Hemoglobin 13.2; Magnesium 2.1; Platelets 257; Potassium 3.3; Sodium 139  personally reviewed   Lipid Panel     Component Value Date/Time   CHOL 184 05/04/2019 1058   TRIG 79 05/04/2019 1058   HDL 49 05/04/2019 1058   CHOLHDL 3.8 05/04/2019 1058   LDLCALC 120 (H) 05/04/2019 1058   personally reviewed   Wt Readings from Last 3 Encounters:  01/16/20 159 lb 6.4 oz (72.3 kg)  12/26/19 150 lb (68 kg)  11/21/19 155 lb (70.3 kg)      Other studies personally reviewed: Additional studies/ records that were reviewed today include: 11/23/19 Ed records, prior ecgs  Review of the above records today demonstrates: as above   ASSESSMENT AND PLAN:  1.  Nonsustained VT She has been documented on ekg from 12/26/19 to have VT, likely arising from the outflow tract. She has been started on metoprolol. Denies FH of sudden death, arrhythmia, or syncope.  Risks, benefits and potential toxicities for medications prescribed and/or refilled reviewed with patient today.   I will order 14 day Zio and an echo.  We discussed lifestyle modification today including regular exercise, stress reduction, and adequate sleep.  She does not drink ETOH and caffeine consumption is low.  Follow-up:  Return in 6-8 weeks for further evaluation.   12/28/19 MD, [facility]

## 2020-01-31 ENCOUNTER — Ambulatory Visit (INDEPENDENT_AMBULATORY_CARE_PROVIDER_SITE_OTHER): Admitting: Family

## 2020-01-31 ENCOUNTER — Other Ambulatory Visit: Payer: Self-pay

## 2020-01-31 ENCOUNTER — Encounter: Payer: Self-pay | Admitting: Family

## 2020-01-31 VITALS — BP 125/74 | HR 97 | Temp 97.6°F | Ht 65.0 in | Wt 160.8 lb

## 2020-01-31 DIAGNOSIS — R112 Nausea with vomiting, unspecified: Secondary | ICD-10-CM

## 2020-01-31 DIAGNOSIS — G43701 Chronic migraine without aura, not intractable, with status migrainosus: Secondary | ICD-10-CM | POA: Diagnosis not present

## 2020-01-31 DIAGNOSIS — M25511 Pain in right shoulder: Secondary | ICD-10-CM | POA: Diagnosis not present

## 2020-01-31 DIAGNOSIS — M25512 Pain in left shoulder: Secondary | ICD-10-CM

## 2020-01-31 DIAGNOSIS — J019 Acute sinusitis, unspecified: Secondary | ICD-10-CM

## 2020-01-31 DIAGNOSIS — K219 Gastro-esophageal reflux disease without esophagitis: Secondary | ICD-10-CM

## 2020-01-31 DIAGNOSIS — J4530 Mild persistent asthma, uncomplicated: Secondary | ICD-10-CM

## 2020-01-31 DIAGNOSIS — R059 Cough, unspecified: Secondary | ICD-10-CM | POA: Diagnosis not present

## 2020-01-31 DIAGNOSIS — B079 Viral wart, unspecified: Secondary | ICD-10-CM

## 2020-01-31 DIAGNOSIS — F411 Generalized anxiety disorder: Secondary | ICD-10-CM

## 2020-01-31 DIAGNOSIS — F339 Major depressive disorder, recurrent, unspecified: Secondary | ICD-10-CM

## 2020-01-31 DIAGNOSIS — A048 Other specified bacterial intestinal infections: Secondary | ICD-10-CM

## 2020-01-31 MED ORDER — SUMATRIPTAN SUCCINATE 50 MG PO TABS: 50.0000 mg | ORAL_TABLET | Freq: Once | ORAL | 11 refills | Status: DC

## 2020-01-31 MED ORDER — ALPRAZOLAM 1 MG PO TABS: 1.0000 mg | ORAL_TABLET | Freq: Three times a day (TID) | ORAL | 2 refills | Status: DC

## 2020-01-31 MED ORDER — METOPROLOL SUCCINATE ER 25 MG PO TB24: 25.0000 mg | ORAL_TABLET | Freq: Every day | ORAL | 1 refills | Status: DC

## 2020-01-31 MED ORDER — DULOXETINE HCL 60 MG PO CPEP: 60.0000 mg | ORAL_CAPSULE | Freq: Every day | ORAL | 3 refills | Status: DC

## 2020-01-31 MED ORDER — CYCLOBENZAPRINE HCL 10 MG PO TABS: 10.0000 mg | ORAL_TABLET | Freq: Three times a day (TID) | ORAL | 1 refills | Status: DC | PRN

## 2020-01-31 MED ORDER — PANTOPRAZOLE SODIUM 40 MG PO TBEC: 40.0000 mg | DELAYED_RELEASE_TABLET | Freq: Two times a day (BID) | ORAL | 2 refills | Status: DC

## 2020-01-31 MED ORDER — BUPROPION HCL ER (XL) 300 MG PO TB24: 300.0000 mg | ORAL_TABLET | Freq: Every day | ORAL | 3 refills | Status: DC

## 2020-01-31 MED ORDER — ALBUTEROL SULFATE HFA 108 (90 BASE) MCG/ACT IN AERS: 2.0000 | INHALATION_SPRAY | Freq: Four times a day (QID) | RESPIRATORY_TRACT | 2 refills | Status: DC | PRN

## 2020-01-31 MED ORDER — AMOXICILLIN-POT CLAVULANATE 875-125 MG PO TABS: 1.0000 | ORAL_TABLET | Freq: Two times a day (BID) | ORAL | 0 refills | Status: DC

## 2020-01-31 MED ORDER — OXCARBAZEPINE 300 MG PO TABS: ORAL_TABLET | ORAL | 3 refills | Status: DC

## 2020-01-31 NOTE — Progress Notes (Signed)
Subjective:    Patient ID: Kimberly Terrell. Dawe, female    DOB: [birth year], 44 y.o.   MRN: [id number]  Chief Complaint  Patient presents with  . Medical Management of Chronic Issues  . Nasal Congestion    x2 weeks  . Warts    wants frozen off   Pt presents to the office today to for chronic follow up. She is followed by GI for GERD and hx H pylori, but has not recently.   She is followed by Cardiologists for palpitations. She is scheduled for a ECHO on the 02/04/20 and holter monitor placed.   She is also complaining of a wart on left index finger that has been there for years. Has tried multiple OTC without relief.  Anxiety Presents for follow-up visit. Symptoms include depressed mood, excessive worry, irritability, nausea, nervous/anxious behavior and restlessness. Symptoms occur most days. The severity of symptoms is moderate.   Her past medical history is significant for asthma.  Depression        This is a chronic problem.  The current episode started more than 1 year ago.   The onset quality is gradual.   The problem occurs intermittently.  The problem has been waxing and waning since onset.  Associated symptoms include helplessness, hopelessness, irritable, restlessness, headaches and sad.  Past treatments include SNRIs - Serotonin and norepinephrine reuptake inhibitors.  Past medical history includes anxiety.   Asthma She complains of cough and hoarse voice. There is no wheezing. This is a chronic problem. The current episode started more than 1 year ago. The problem occurs intermittently. Associated symptoms include headaches, heartburn and sneezing. Pertinent negatives include no ear pain. Her past medical history is significant for asthma.  Migraine  This is a chronic problem. The current episode started more than 1 year ago. The problem occurs intermittently. Associated symptoms include coughing, nausea, phonophobia, photophobia and sinus pressure. Pertinent negatives include no  ear pain.  Gastroesophageal Reflux She complains of belching, coughing, heartburn, a hoarse voice and nausea. She reports no wheezing. This is a chronic problem. The current episode started more than 1 year ago. The problem occurs occasionally. She has tried a PPI for the symptoms. The treatment provided moderate relief.  Sinusitis This is a new problem. The current episode started 1 to 4 weeks ago. The problem has been gradually worsening since onset. There has been no fever. Associated symptoms include congestion, coughing, headaches, a hoarse voice, sinus pressure and sneezing. Pertinent negatives include no ear pain. Past treatments include oral decongestants.      Review of Systems  Constitutional: Positive for irritability.  HENT: Positive for congestion, hoarse voice, sinus pressure and sneezing. Negative for ear pain.   Eyes: Positive for photophobia.  Respiratory: Positive for cough. Negative for wheezing.   Gastrointestinal: Positive for heartburn and nausea.  Neurological: Positive for headaches.  Psychiatric/Behavioral: Positive for depression. The patient is nervous/anxious.   All other systems reviewed and are negative.      Objective:   Physical Exam Vitals reviewed.  Constitutional:      General: She is irritable. She is not in acute distress.    Appearance: She is well-developed.  HENT:     Head: Normocephalic and atraumatic.     Right Ear: Tympanic membrane normal.     Left Ear: Tympanic membrane normal.  Eyes:     Pupils: Pupils are equal, round, and reactive to light.  Neck:     Thyroid: No thyromegaly.  Cardiovascular:  Rate and Rhythm: Normal rate and regular rhythm.     Heart sounds: Normal heart sounds. No murmur heard.   Pulmonary:     Effort: Pulmonary effort is normal. No respiratory distress.     Breath sounds: Normal breath sounds. No wheezing.  Abdominal:     General: Bowel sounds are normal. There is no distension.     Palpations: Abdomen  is soft.     Tenderness: There is no abdominal tenderness.  Musculoskeletal:        General: No tenderness. Normal range of motion.     Cervical back: Normal range of motion and neck supple.  Skin:    General: Skin is warm and dry.          Comments: Small wart on left medial index finger  Neurological:     Mental Status: She is alert and oriented to person, place, and time.     Cranial Nerves: No cranial nerve deficit.     Deep Tendon Reflexes: Reflexes are normal and symmetric.  Psychiatric:        Behavior: Behavior normal.        Thought Content: Thought content normal.        Judgment: Judgment normal.      Cryotherapy used on left medial  Index finger.  Pt tolerated well.   BP 125/74   Pulse 97   Temp 97.6 F (36.4 C) (Temporal)   Ht 5\' 5"  (1.651 m)   Wt 160 lb 12.8 oz (72.9 kg)   SpO2 96%   BMI 26.76 kg/m      Assessment & Plan:  Ivyrose R. Devivo comes in today with chief complaint of Medical Management of Chronic Issues, Nasal Congestion (x2 weeks), and Warts (wants frozen off)   Diagnosis and orders addressed:  1. Acute pain of both shoulders - cyclobenzaprine (FLEXERIL) 10 MG tablet; Take 1 tablet (10 mg total) by mouth 3 (three) times daily as needed for muscle spasms.  Dispense: 30 tablet; Refill: 1  2. Chronic migraine without aura with status migrainosus, not intractable - SUMAtriptan (IMITREX) 50 MG tablet; Take 1 tablet (50 mg total) by mouth once for 1 dose. May repeat ONCE in 2 hours if headache persists or recurs.  Dispense: 10 tablet; Refill: 11  3. Cough  4. Depression, recurrent (HCC) -Will increase Wellbutrin to 300 mg from 150 mg - Oxcarbazepine (TRILEPTAL) 300 MG tablet; TAKE 1 TABLET BY MOUTH AT BEDTIME  Dispense: 90 tablet; Refill: 3 - DULoxetine (CYMBALTA) 60 MG capsule; Take 1 capsule (60 mg total) by mouth daily.  Dispense: 90 capsule; Refill: 3 - buPROPion (WELLBUTRIN XL) 300 MG 24 hr tablet; Take 1 tablet (300 mg total) by mouth  daily.  Dispense: 90 tablet; Refill: 3  5. GAD (generalized anxiety disorder) - DULoxetine (CYMBALTA) 60 MG capsule; Take 1 capsule (60 mg total) by mouth daily.  Dispense: 90 capsule; Refill: 3 - ALPRAZolam (XANAX) 1 MG tablet; Take 1 tablet (1 mg total) by mouth 3 (three) times daily.  Dispense: 75 tablet; Refill: 2 - buPROPion (WELLBUTRIN XL) 300 MG 24 hr tablet; Take 1 tablet (300 mg total) by mouth daily.  Dispense: 90 tablet; Refill: 3  6. H. pylori infection - pantoprazole (PROTONIX) 40 MG tablet; Take 1 tablet (40 mg total) by mouth 2 (two) times daily before a meal.  Dispense: 180 tablet; Refill: 2  7. Gastroesophageal reflux disease, unspecified whether esophagitis present - pantoprazole (PROTONIX) 40 MG tablet; Take 1 tablet (40 mg total)  by mouth 2 (two) times daily before a meal.  Dispense: 180 tablet; Refill: 2  8. Non-intractable vomiting with nausea, unspecified vomiting type - pantoprazole (PROTONIX) 40 MG tablet; Take 1 tablet (40 mg total) by mouth 2 (two) times daily before a meal.  Dispense: 180 tablet; Refill: 2  9. Mild persistent asthma without complication  10. Gastroesophageal reflux disease without esophagitis  11. Acute sinusitis, recurrence not specified, unspecified location - Take meds as prescribed - Use a cool mist humidifier  -Use saline nose sprays frequently -Force fluids -For any cough or congestion  Use plain Mucinex- regular strength or max strength is fine -For fever or aces or pains- take tylenol or ibuprofen. -Throat lozenges if help -New toothbrush in 3 days - amoxicillin-clavulanate (AUGMENTIN) 875-125 MG tablet; Take 1 tablet by mouth 2 (two) times daily.  Dispense: 14 tablet; Refill: 0  12. Viral wart on finger Do not pick on blister   Labs reviewed from hospital Patient reviewed in [place] controlled database, no flags noted. Contract and drug screen are up to date.  Will increase Wellbutrin to 300 mg from 150 mg  Health Maintenance  reviewed Diet and exercise encouraged  Follow up plan: 3 months    Jannifer Rodney, FNP

## 2020-01-31 NOTE — Patient Instructions (Signed)
Warts ° °Warts are small growths on the skin. They are common and can occur on many areas of the body. A person may have one wart or several warts. °In many cases, warts do not require treatment. They usually go away on their own over a period of many months to a few years. If needed, warts that cause problems or do not go away on their own can be treated. °What are the causes? °Warts are caused by a type of virus that is called human papillomavirus (HPV). °· This virus can spread from person to person through direct contact. °· Warts can also spread to other areas of the body when a person scratches a wart and then scratches another area of his or her body. °What increases the risk? °You are more likely to develop this condition if: °· You are 10-20 years old. °· You have a weakened body defense system (immune system). °· You are Caucasian. °What are the signs or symptoms? °The main symptom of this condition is small growths on the skin. Warts may: °· Be round or oval or have an irregular shape. °· Have a rough surface. °· Range in color from skin color to light yellow, brown, or gray. °· Generally be less than ½ inch (1.3 cm) in size. °· Go away and then come back again. °Most warts are painless, but some can be painful if they are large or occur in an area of the body where pressure will be applied to them, such as the bottom of the foot. °How is this diagnosed? °A wart can usually be diagnosed based on its appearance. In some cases, a tissue sample may be removed (biopsy) to be looked at under a microscope. °How is this treated? °In many cases, warts do not need treatment. Sometimes treatment is desired. If treatment is needed or desired, options may include: °· Applying medicated solutions, creams, or patches to the wart. These may be over-the-counter or prescription medicines that make the skin soft so that layers will gradually shed away. In many cases, the medicine is applied one or two times per day and  covered with a bandage. °· Putting duct tape over the top of the wart (occlusion). You will leave the tape in place for as long as told by your health care provider and then replace it with a new strip of tape. This is done until the wart goes away. °· Freezing the wart with liquid nitrogen (cryotherapy). °· Burning the wart with: °? Laser treatment. °? An electrified probe (electrocautery). °· Injection of a medicine (Candida antigen) into the wart to help the body's immune system fight off the wart. °· Surgery to remove the wart. °Follow these instructions at home: °Medicines °· Apply over-the-counter and prescription medicines only as told by your health care provider. °· Do not apply over-the-counter wart medicines to your face or genitals unless your health care provider tells you to do that. °Lifestyle °· Keep your immune system healthy. To do this: °? Eat a healthy, balanced diet. °? Get enough sleep. °? Do not use any products that contain nicotine or tobacco, such as cigarettes and e-cigarettes. If you need help quitting, ask your health care provider. °General instructions ° °· Wash your hands after you touch a wart. °· Do not scratch or pick at a wart. °· Avoid shaving hair that is over a wart. °· Keep all follow-up visits as told by your health care provider. This is important. °Contact a health care provider if: °·   Your warts do not improve after treatment. °· You have redness, swelling, or pain at the site of a wart. °· You have bleeding from a wart that does not stop with light pressure. °· You have diabetes and you develop a wart. °Summary °· Warts are small growths on the skin. They are common and can occur on many areas of the body. °· In many cases, warts do not need treatment. Sometimes treatment is desired. If treatment is needed or desired, there are several treatment options. °· Apply over-the-counter and prescription medicines only as told by your health care provider. °· Wash your hands  after you touch a wart. °· Keep all follow-up visits as told by your health care provider. This is important. °This information is not intended to replace advice given to you by your health care provider. Make sure you discuss any questions you have with your health care provider. °Document Revised: 08/02/2017 Document Reviewed: 08/02/2017 °Elsevier Patient Education © 2020 [employer] ° °

## 2020-02-04 ENCOUNTER — Ambulatory Visit (HOSPITAL_COMMUNITY): Attending: Cardiology

## 2020-02-04 ENCOUNTER — Other Ambulatory Visit: Payer: Self-pay

## 2020-02-04 DIAGNOSIS — I472 Ventricular tachycardia: Secondary | ICD-10-CM | POA: Diagnosis not present

## 2020-02-04 DIAGNOSIS — I493 Ventricular premature depolarization: Secondary | ICD-10-CM

## 2020-02-04 LAB — ECHOCARDIOGRAM COMPLETE
Area-P 1/2: 3.65 cm2
S' Lateral: 2.7 cm

## 2020-02-04 NOTE — Addendum Note (Signed)
Addended by: Sampson Goon on: 02/04/2020 02:56 PM   Modules accepted: Orders

## 2020-02-06 ENCOUNTER — Other Ambulatory Visit (INDEPENDENT_AMBULATORY_CARE_PROVIDER_SITE_OTHER)

## 2020-02-06 DIAGNOSIS — I493 Ventricular premature depolarization: Secondary | ICD-10-CM

## 2020-02-12 ENCOUNTER — Telehealth: Payer: Self-pay | Admitting: Internal Medicine

## 2020-02-12 NOTE — Telephone Encounter (Signed)
Patient stated she called monitor company and they advised to take monitor off and turn it in, that they should have enough information. Patient stated she has been feeling dizziness and lightheadedness since not being able to take her metoprolol. Patient took her metoprolol 30 minutes ago, after she took off the monitor. Patient will call back if her symptoms do not improve with taking the metoprolol. Tried to offer patient an office visit, but husband state they are out of town, [place]. Did not advise since patient has already made up her mind to take monitor off and start back on her metoprolol. Will forward to Dr. Johney Frame and his nurse so they are aware.

## 2020-02-12 NOTE — Telephone Encounter (Signed)
Pt calling in with 2 different problems. I will forward this to both our monitor tech in regards to irritation from the monitor stickers. Pt is c/o dizziness and lightheadedness. Will also forward to Dr. Jenel Lucks nurse Inetta Fermo and triage as Inetta Fermo is unavailable today.

## 2020-02-12 NOTE — Telephone Encounter (Signed)
Patient's husband states the patient has only had her heart monitor on for 6 days, but it is causing irritation. He states her entire chest is red and hot. He states she cannot sleep well, because of the irritation. He states she also is she is dizzy and lightheaded due to not being on her medications. He states she cannot walk from one room to the other without getting dizzy and has trouble standing in the shower. Please advise.

## 2020-02-13 NOTE — Telephone Encounter (Signed)
Doctor Allred made aware.

## 2020-02-27 ENCOUNTER — Ambulatory Visit (INDEPENDENT_AMBULATORY_CARE_PROVIDER_SITE_OTHER)

## 2020-02-27 ENCOUNTER — Ambulatory Visit (INDEPENDENT_AMBULATORY_CARE_PROVIDER_SITE_OTHER): Admitting: Orthopaedic Surgery

## 2020-02-27 ENCOUNTER — Encounter: Payer: Self-pay | Admitting: Orthopaedic Surgery

## 2020-02-27 ENCOUNTER — Other Ambulatory Visit: Payer: Self-pay

## 2020-02-27 ENCOUNTER — Ambulatory Visit: Payer: Self-pay

## 2020-02-27 VITALS — Ht 63.0 in | Wt 160.0 lb

## 2020-02-27 DIAGNOSIS — M25512 Pain in left shoulder: Secondary | ICD-10-CM

## 2020-02-27 DIAGNOSIS — M25511 Pain in right shoulder: Secondary | ICD-10-CM | POA: Insufficient documentation

## 2020-02-27 DIAGNOSIS — G8929 Other chronic pain: Secondary | ICD-10-CM

## 2020-02-27 MED ORDER — LIDOCAINE HCL 1 % IJ SOLN
2.0000 mL | INTRAMUSCULAR | Status: AC | PRN
  Administered 2020-02-27: 2 mL

## 2020-02-27 MED ORDER — BUPIVACAINE HCL 0.5 % IJ SOLN
2.0000 mL | INTRAMUSCULAR | Status: AC | PRN
  Administered 2020-02-27: 2 mL via INTRA_ARTICULAR

## 2020-02-27 NOTE — Progress Notes (Addendum)
Office Visit Note   Patient: Kimberly Terrell. [name]           Date of Birth: 1975-08-24           MRN: [id number] Visit Date: 02/27/2020              Requested by: [name], FNP [address] [place],  [place] [address] PCP: [name], FNP   Assessment & Plan: Visit Diagnoses:  1. Chronic pain of both shoulders    Recurrent pain over the greater trochanter right hip that responded to cortisone injection of the greater trochanter back in August.  We will repeat the injection.  Also seen today for bilateral shoulder pain and only it seems to occur when she is lifting her grandchildren overhead.  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  Only occasionally does she have any pain with sleep.  Today's exam was completely benign without any evidence of pathology with negative empty can and impingement testing.  Excellent strength.  Will refer to physical therapy for strengthening exercises and see back as needed.  X-rays were negative  Follow-Up Instructions: Return if symptoms worsen or fail to improve.   Orders:  Orders Placed This Encounter  Procedures  . Large Joint Inj: R glenohumeral  . XR Shoulder Left  . XR Shoulder Right  . Ambulatory referral to Physical Therapy       Procedures: Large Joint Inj: R glenohumeral on 02/27/2020 2:02 PM Indications: pain and diagnostic evaluation Details: 25 G 1.5 in needle, posterior approach  Arthrogram: No  Medications: 2 mL lidocaine 1 %; 2 mL bupivacaine 0.5 %  2 mL betamethasone injected over the greater trochanter right hip with lidocaine and Marcaine Consent was given by the patient. Immediately prior to procedure a time out was called to verify the correct patient, procedure, equipment, support staff and site/side marked as required. Patient was prepped and draped in the usual sterile fashion.       Clinical Data: No additional  findings.   Subjective: Chief Complaint  Patient presents with  . Right Hip - Follow-up  . Right Shoulder - Pain  . Left Shoulder - Pain  Patient presents today for recurrent right hip pain. She had her greater trochanter injected with cortisone in August. She wants to get it reinjected today. She also states that she has bilateral shoulder pain. There are times that she cannot raise her arms. She takes hydrocodone if the pain gets bad.  Pain seems to be worse when she is lifting her grandchildren overhead and occasionally when she sleeps.  Most days she does not have any pain.  No numbness or tingling or neck discomfort  HPI  Review of Systems   Objective: Vital Signs: Ht 5\' 3"  (1.6 m)   Wt 160 lb (72.6 kg)   BMI 28.34 kg/m   Physical Exam Constitutional:      Appearance: She is well-developed.  Eyes:     Pupils: Pupils are equal, round, and reactive to light.  Pulmonary:     Effort: Pulmonary effort is normal.  Skin:    General: Skin is warm and dry.  Neurological:     Mental Status: She is alert and oriented to person, place, and time.  Psychiatric:        Behavior: Behavior normal.     Ortho Exam localized tenderness over the greater trochanter right hip consistent with her prior diagnosis of  greater trochanteric bursitis.  Benign exam of both shoulders.  Negative impingement and empty can testing.  Negative Speed sign.  No grinding or crepitation or localized areas of tenderness in the lateral anterior subacromial region or at the [facility] joint.  Good strength.  Good grip and release.  No loss of motion either shoulder.  Easily able to place both shoulders well overhead and touch the middle of her back  Specialty Comments:  No specialty comments available.  Imaging: XR Shoulder Left  Result Date: 02/27/2020 The left shoulder obtained in 3 projections and were similar to the right shoulder and that I did not see any specific pathology.  Humeral head was centered about  the glenoid.  Normal space between the humeral head of the acromium.  No acute changes or evidence of ectopic calcification.  XR Shoulder Right  Result Date: 02/27/2020 Films of the right shoulder obtained in several projections.  Humeral head is centered about the glenoid.  Normal space between the humeral head and the acromium.  No ectopic calcification or acute change.  No obvious degenerative change at the acromioclavicular joint.  No obvious pathology    PMFS History: Patient Active Problem List   Diagnosis Date Noted  . Shoulder pain, bilateral 02/27/2020  . Asthma 10/30/2019  . Chronic hip pain 10/30/2019  . TOA (tubo-ovarian abscess) 07/04/2019  . IUD infection (HCC) 07/04/2019  . Gastroesophageal reflux disease without esophagitis 05/04/2019  . H. pylori infection 03/28/2019  . Abnormal CT of the abdomen 03/28/2019  . Chronic daily headache 10/12/2017  . Allergic rhinitis 07/25/2017  . Depression, recurrent (HCC) 04/04/2017  . Migraine 04/04/2017  . Bursitis of right hip 04/04/2017  . GAD (generalized anxiety disorder) 04/04/2017  . DUB (dysfunctional uterine bleeding) 04/04/2017   Past Medical History:  Diagnosis Date  . Anxiety   . Arthritis   . Asthma   . Bursitis    Right Hip and Right Knee  . Depression   . H. pylori infection    s/p triple therapy x 2  . Migraines     Family History  Problem Relation Age of Onset  . Depression Mother   . Depression Daughter   . Colon cancer Neg Hx   . Esophageal cancer Neg Hx   . Gastric cancer Neg Hx     Past Surgical History:  Procedure Laterality Date  . COSMETIC SURGERY  2008   breast implants    Social History   Occupational History  . Occupation: [employer]  . Smoking status: Current Every Day Smoker    Packs/day: 1.00    Types: Cigarettes  . Smokeless tobacco: Never Used  . Tobacco comment: Planning to quit 03/30/2019  Vaping Use  . Vaping Use: Never used  Substance and Sexual  Activity  . Alcohol use: No  . Drug use: No  . Sexual activity: Yes    Birth control/protection: I.U.D., Surgical    Comment: husband has had a vasectomy

## 2020-03-10 ENCOUNTER — Encounter: Payer: Self-pay | Admitting: Internal Medicine

## 2020-03-10 ENCOUNTER — Other Ambulatory Visit: Payer: Self-pay

## 2020-03-10 ENCOUNTER — Ambulatory Visit (INDEPENDENT_AMBULATORY_CARE_PROVIDER_SITE_OTHER): Admitting: Internal Medicine

## 2020-03-10 VITALS — BP 118/78 | HR 80 | Ht 63.0 in | Wt 161.0 lb

## 2020-03-10 DIAGNOSIS — R000 Tachycardia, unspecified: Secondary | ICD-10-CM

## 2020-03-10 DIAGNOSIS — I493 Ventricular premature depolarization: Secondary | ICD-10-CM

## 2020-03-10 NOTE — Patient Instructions (Addendum)
Medication Instructions:  Your physician recommends that you continue on your current medications as directed. Please refer to the Current Medication list given to you today.  *If you need a refill on your cardiac medications before your next appointment, please call your pharmacy*  Lab Work: None ordered.  If you have labs (blood work) drawn today and your tests are completely normal, you will receive your results only by: . MyChart Message (if you have MyChart) OR . A paper copy in the mail If you have any lab test that is abnormal or we need to change your treatment, we will call you to review the results.  Testing/Procedures: None ordered.  Follow-Up: At [facility], you and your health needs are our priority.  As part of our continuing mission to provide you with exceptional heart care, we have created designated Provider Care Teams.  These Care Teams include your primary Cardiologist (physician) and Advanced Practice Providers (APPs -  Physician Assistants and Nurse Practitioners) who all work together to provide you with the care you need, when you need it.  We recommend signing up for the patient portal called "MyChart".  Sign up information is provided on this After Visit Summary.  MyChart is used to connect with patients for Virtual Visits (Telemedicine).  Patients are able to view lab/test results, encounter notes, upcoming appointments, etc.  Non-urgent messages can be sent to your provider as well.   To learn more about what you can do with MyChart, go to [email or web address].    Your next appointment:   Your physician wants you to follow-up in: 6 months with Renee Ursuy.  You will receive a reminder letter in the mail two months in advance. If you don't receive a letter, please call our office to schedule the follow-up appointment.    Other Instructions:  

## 2020-03-10 NOTE — Progress Notes (Signed)
PCP: Kimberly Spencer, FNP   Primary EP: Dr Kimberly Terrell is a 44 y.o. female who presents today for routine electrophysiology followup.  Since last being seen in our clinic, the patient reports doing very well. She has rare sinus tachycardia.  She finds this very anxiety provoking.  Today, she denies symptoms of palpitations, chest pain, shortness of breath,  lower extremity edema, dizziness, presyncope, or syncope.  The patient is otherwise without complaint today.   Past Medical History:  Diagnosis Date  . Anxiety   . Arthritis   . Asthma   . Bursitis    Right Hip and Right Knee  . Depression   . H. pylori infection    s/p triple therapy x 2  . Migraines    Past Surgical History:  Procedure Laterality Date  . COSMETIC SURGERY  2008   breast implants     ROS- all systems are reviewed and negatives except as per HPI above  Current Outpatient Medications  Medication Sig Dispense Refill  . albuterol (VENTOLIN HFA) 108 (90 Base) MCG/ACT inhaler Inhale 2 puffs into the lungs every 6 (six) hours as needed for wheezing or shortness of breath. 18 g 2  . ALPRAZolam (XANAX) 1 MG tablet Take 1 tablet (1 mg total) by mouth 3 (three) times daily. 75 tablet 2  . amoxicillin-clavulanate (AUGMENTIN) 875-125 MG tablet Take 1 tablet by mouth 2 (two) times daily. 14 tablet 0  . buPROPion (WELLBUTRIN XL) 300 MG 24 hr tablet Take 1 tablet (300 mg total) by mouth daily. 90 tablet 3  . cyclobenzaprine (FLEXERIL) 10 MG tablet Take 1 tablet (10 mg total) by mouth 3 (three) times daily as needed for muscle spasms. 30 tablet 1  . diclofenac sodium (VOLTAREN) 1 % GEL Apply 2 g topically 4 (four) times daily. 300 g 1  . DULoxetine (CYMBALTA) 60 MG capsule Take 1 capsule (60 mg total) by mouth daily. 90 capsule 3  . Melatonin 10 MG CAPS Take 20 mg by mouth at bedtime.    . metoprolol succinate (TOPROL-XL) 25 MG 24 hr tablet Take 1 tablet (25 mg total) by mouth daily. 30 tablet 1  .  montelukast (SINGULAIR) 10 MG tablet Take 1 tablet (10 mg total) by mouth at bedtime. (Patient taking differently: Take 10 mg by mouth at bedtime as needed (for respiratory flares).) 90 tablet 1  . Oxcarbazepine (TRILEPTAL) 300 MG tablet TAKE 1 TABLET BY MOUTH AT BEDTIME 90 tablet 3  . pantoprazole (PROTONIX) 40 MG tablet Take 1 tablet (40 mg total) by mouth 2 (two) times daily before a meal. 180 tablet 2  . potassium chloride SA (KLOR-CON) 20 MEQ tablet Take 1 tablet (20 mEq total) by mouth 2 (two) times daily. 15 tablet 0  . pseudoephedrine (SUDAFED) 30 MG tablet Take 30 mg by mouth every 4 (four) hours as needed for congestion.    . SUMAtriptan (IMITREX) 50 MG tablet Take 1 tablet (50 mg total) by mouth once for 1 dose. May repeat ONCE in 2 hours if headache persists or recurs. 10 tablet 11   No current facility-administered medications for this visit.    Physical Exam: Vitals:   03/10/20 1129  BP: 118/78  Pulse: 80  SpO2: 96%  Weight: 161 lb (73 kg)  Height: 5\' 3"  (1.6 m)    GEN- The patient is well appearing, alert and oriented x 3 today.   Head- normocephalic, atraumatic Eyes-  Sclera clear, conjunctiva pink Ears- hearing intact  Oropharynx- clear Lungs-   normal work of breathing Heart- Regular rate and rhythm  GI- soft  Extremities- no clubbing, cyanosis, or edema  Wt Readings from Last 3 Encounters:  03/10/20 161 lb (73 kg)  02/27/20 160 lb (72.6 kg)  01/31/20 160 lb 12.8 oz (72.9 kg)   Echo 02/04/20- EF 60%, normal LV and RV size and function, mild LVH  EKG tracing ordered today is personally reviewed and shows sinus rhythm  Assessment and Plan:  1. Nonsustained VT/ PVCs Well controlled, very low burden by event monitor No concerns on recent echo or event monitor  2. Sinus tachycardia Likely multifactorial and worsened by anxiety Regular exercise, mindfulness, yoga, hydration advised I have also encouraged her to stop wearing her apple watch (which she admits  to looking at very frequently) as I believe that this has contributed to cardiac awareness.  We should not increase medicines currently No indication for further EP workup  Risks, benefits and potential toxicities for medications prescribed and/or refilled reviewed with patient today.   Return to see EP PA in 6 months  Hillis Range MD, [facility] 03/10/2020 11:32 AM

## 2020-03-12 ENCOUNTER — Ambulatory Visit: Admitting: Internal Medicine

## 2020-04-09 ENCOUNTER — Encounter: Payer: Self-pay | Admitting: Family

## 2020-04-09 ENCOUNTER — Ambulatory Visit (INDEPENDENT_AMBULATORY_CARE_PROVIDER_SITE_OTHER): Admitting: Family

## 2020-04-09 ENCOUNTER — Other Ambulatory Visit: Payer: Self-pay

## 2020-04-09 ENCOUNTER — Other Ambulatory Visit: Payer: Self-pay | Admitting: Internal Medicine

## 2020-04-09 VITALS — BP 118/75 | HR 96 | Temp 97.7°F | Ht 63.0 in | Wt 164.4 lb

## 2020-04-09 DIAGNOSIS — F411 Generalized anxiety disorder: Secondary | ICD-10-CM

## 2020-04-09 DIAGNOSIS — Z9114 Patient's other noncompliance with medication regimen: Secondary | ICD-10-CM | POA: Insufficient documentation

## 2020-04-09 DIAGNOSIS — F1320 Sedative, hypnotic or anxiolytic dependence, uncomplicated: Secondary | ICD-10-CM | POA: Insufficient documentation

## 2020-04-09 DIAGNOSIS — B079 Viral wart, unspecified: Secondary | ICD-10-CM | POA: Diagnosis not present

## 2020-04-09 DIAGNOSIS — Z91148 Patient's other noncompliance with medication regimen for other reason: Secondary | ICD-10-CM | POA: Insufficient documentation

## 2020-04-09 DIAGNOSIS — Z1159 Encounter for screening for other viral diseases: Secondary | ICD-10-CM

## 2020-04-09 DIAGNOSIS — F339 Major depressive disorder, recurrent, unspecified: Secondary | ICD-10-CM

## 2020-04-09 DIAGNOSIS — K219 Gastro-esophageal reflux disease without esophagitis: Secondary | ICD-10-CM

## 2020-04-09 DIAGNOSIS — J4530 Mild persistent asthma, uncomplicated: Secondary | ICD-10-CM

## 2020-04-09 DIAGNOSIS — G43701 Chronic migraine without aura, not intractable, with status migrainosus: Secondary | ICD-10-CM

## 2020-04-09 DIAGNOSIS — Z79899 Other long term (current) drug therapy: Secondary | ICD-10-CM

## 2020-04-09 DIAGNOSIS — Z114 Encounter for screening for human immunodeficiency virus [HIV]: Secondary | ICD-10-CM

## 2020-04-09 MED ORDER — EMGALITY 120 MG/ML ~~LOC~~ SOAJ: 120.0000 mg | SUBCUTANEOUS | 4 refills | Status: DC

## 2020-04-09 MED ORDER — ALPRAZOLAM 1 MG PO TABS: 1.0000 mg | ORAL_TABLET | Freq: Three times a day (TID) | ORAL | 2 refills | Status: DC

## 2020-04-09 NOTE — Patient Instructions (Signed)

## 2020-04-09 NOTE — Progress Notes (Signed)
Subjective:    Patient ID: Kimberly Terrell, female    DOB: January 08, 1976, 45 y.o.   MRN: [id number]  Chief Complaint  Patient presents with  . Medical Management of Chronic Issues    Wants to discuss menopause   . Warts    Freeze    Pt presents to the office today to for chronic follow up. She is followed by GI for GERD and hx H pylori, but has not recently.   She is followed by Cardiologists for palpitations. She had a negative ECHO and Holter monitor that was negative for acute finding.   She is complaining of recurrent wart on left index. Last visit we froze this and she reports it blistered but return. She reports it is slightly smaller.   She is asking for her hormones checked today. She states she has hot flashes every night. She continues to have period every month.  Asthma She complains of cough. There is no shortness of breath or wheezing. This is a chronic problem. The current episode started more than 1 year ago. The problem occurs intermittently. The problem has been waxing and waning. Associated symptoms include headaches and heartburn. Her symptoms are aggravated by exposure to smoke. She reports minimal improvement on treatment. Her past medical history is significant for asthma.  Gastroesophageal Reflux She complains of belching, coughing and heartburn. She reports no wheezing. This is a chronic problem. The current episode started more than 1 year ago. The problem occurs occasionally. She has tried a PPI for the symptoms. The treatment provided moderate relief.  Depression        This is a chronic problem.  The current episode started more than 1 year ago.   The onset quality is gradual.   The problem occurs intermittently.  Associated symptoms include irritable, restlessness, decreased interest, headaches and sad.  Associated symptoms include no helplessness and no hopelessness.  Past medical history includes anxiety.   Anxiety Presents for follow-up visit. Symptoms  include excessive worry, irritability and restlessness. Patient reports no shortness of breath. Symptoms occur most days.   Her past medical history is significant for asthma.  Headache  This is a chronic problem. The current episode started more than 1 year ago. The problem occurs daily. The problem has been unchanged. Associated symptoms include coughing. Pertinent negatives include no phonophobia or photophobia. She has tried triptans for the symptoms.      Review of Systems  Constitutional: Positive for irritability.  Eyes: Negative for photophobia.  Respiratory: Positive for cough. Negative for shortness of breath and wheezing.   Gastrointestinal: Positive for heartburn.  Neurological: Positive for headaches.  Psychiatric/Behavioral: Positive for depression.  All other systems reviewed and are negative.      Objective:   Physical Exam Vitals reviewed.  Constitutional:      General: She is irritable. She is not in acute distress.    Appearance: She is well-developed and well-nourished.  HENT:     Head: Normocephalic and atraumatic.     Right Ear: Tympanic membrane normal.     Left Ear: Tympanic membrane normal.     Mouth/Throat:     Mouth: Oropharynx is clear and moist.  Eyes:     Pupils: Pupils are equal, round, and reactive to light.  Neck:     Thyroid: No thyromegaly.  Cardiovascular:     Rate and Rhythm: Normal rate and regular rhythm.     Pulses: Intact distal pulses.     Heart sounds: Normal heart  sounds. No murmur heard.   Pulmonary:     Effort: Pulmonary effort is normal. No respiratory distress.     Breath sounds: Normal breath sounds. No wheezing.  Abdominal:     General: Bowel sounds are normal. There is no distension.     Palpations: Abdomen is soft.     Tenderness: There is no abdominal tenderness.  Musculoskeletal:        General: No tenderness or edema. Normal range of motion.     Cervical back: Normal range of motion and neck supple.  Skin:     General: Skin is warm and dry.     Comments: Small Wart on left index  Neurological:     Mental Status: She is alert and oriented to person, place, and time.     Cranial Nerves: No cranial nerve deficit.     Deep Tendon Reflexes: Reflexes are normal and symmetric.  Psychiatric:        Mood and Affect: Mood and affect normal.        Behavior: Behavior normal.        Thought Content: Thought content normal.        Judgment: Judgment normal.    Cryotherapy used on left index finger. Pt tolerated well.    BP 118/75   Pulse 96   Temp 97.7 F (36.5 C) (Temporal)   Ht 5' 3"  (1.6 m)   Wt 164 lb 6.4 oz (74.6 kg)   BMI 29.12 kg/m        Assessment & Plan:  Kimberly Terrell comes in today with chief complaint of Medical Management of Chronic Issues (Wants to discuss menopause ) and Warts (Freeze )   Diagnosis and orders addressed:  1. GAD (generalized anxiety disorder) Will decrease xanax to #65 from #75 - ALPRAZolam (XANAX) 1 MG tablet; Take 1 tablet (1 mg total) by mouth 3 (three) times daily.  Dispense: 65 tablet; Refill: 2 - CMP14+EGFR - CBC with Differential/Platelet - ToxASSURE Select 13 (MW), Urine  2. Mild persistent asthma without complication - GGE36+OQHU - CBC with Differential/Platelet  3. Gastroesophageal reflux disease without esophagitis - CMP14+EGFR - CBC with Differential/Platelet  4. Depression, recurrent ([place]) - CMP14+EGFR - CBC with Differential/Platelet  5. Chronic migraine without aura with status migrainosus, not intractable Will start Emgality today  - CMP14+EGFR - CBC with Differential/Platelet - Galcanezumab-gnlm (EMGALITY) 120 MG/ML SOAJ; Inject 120 mg into the skin every 30 (thirty) days.  Dispense: 1.12 mL; Refill: 4  6. Benzodiazepine dependence (HCC) - CMP14+EGFR - CBC with Differential/Platelet - ToxASSURE Select 13 (MW), Urine  7. Controlled substance agreement signed - CMP14+EGFR - CBC with Differential/Platelet - ToxASSURE  Select 13 (MW), Urine  8. Encounter for screening for HIV - CMP14+EGFR - CBC with Differential/Platelet - HIV Antibody (routine testing w rflx)  9. Need for hepatitis C screening test - CMP14+EGFR - CBC with Differential/Platelet - Hepatitis C antibody  10. Viral wart on finger   Labs pending Patient reviewed in [place] controlled database, no flags noted. Contract and drug screen are up to date.  Health Maintenance reviewed Diet and exercise encouraged  Follow up plan: 3 months    Evelina Dun, FNP

## 2020-04-10 ENCOUNTER — Other Ambulatory Visit: Payer: Self-pay | Admitting: Family

## 2020-04-10 DIAGNOSIS — D72829 Elevated white blood cell count, unspecified: Secondary | ICD-10-CM

## 2020-04-10 LAB — CMP14+EGFR
ALT: 16 IU/L (ref 0–32)
AST: 19 IU/L (ref 0–40)
Albumin/Globulin Ratio: 2 (ref 1.2–2.2)
Albumin: 4.5 g/dL (ref 3.8–4.8)
Alkaline Phosphatase: 92 IU/L (ref 44–121)
BUN/Creatinine Ratio: 7 — ABNORMAL LOW (ref 9–23)
BUN: 5 mg/dL — ABNORMAL LOW (ref 6–24)
Bilirubin Total: 0.3 mg/dL (ref 0.0–1.2)
CO2: 20 mmol/L (ref 20–29)
Calcium: 9.6 mg/dL (ref 8.7–10.2)
Chloride: 103 mmol/L (ref 96–106)
Creatinine, Ser: 0.73 mg/dL (ref 0.57–1.00)
GFR calc Af Amer: 117 mL/min/{1.73_m2} (ref >59)
GFR calc non Af Amer: 101 mL/min/{1.73_m2} (ref >59)
Globulin, Total: 2.3 g/dL (ref 1.5–4.5)
Glucose: 110 mg/dL — ABNORMAL HIGH (ref 65–99)
Potassium: 4.1 mmol/L (ref 3.5–5.2)
Sodium: 137 mmol/L (ref 134–144)
Total Protein: 6.8 g/dL (ref 6.0–8.5)

## 2020-04-10 LAB — CBC WITH DIFFERENTIAL/PLATELET
Basophils Absolute: 0.1 10*3/uL (ref 0.0–0.2)
Basos: 1 %
EOS (ABSOLUTE): 0.4 10*3/uL (ref 0.0–0.4)
Eos: 2 %
Hematocrit: 42.1 % (ref 34.0–46.6)
Hemoglobin: 13.8 g/dL (ref 11.1–15.9)
Immature Grans (Abs): 0.1 10*3/uL (ref 0.0–0.1)
Immature Granulocytes: 1 %
Lymphocytes Absolute: 2.2 10*3/uL (ref 0.7–3.1)
Lymphs: 13 %
MCH: 29.4 pg (ref 26.6–33.0)
MCHC: 32.8 g/dL (ref 31.5–35.7)
MCV: 90 fL (ref 79–97)
Monocytes Absolute: 1.1 10*3/uL — ABNORMAL HIGH (ref 0.1–0.9)
Monocytes: 6 %
Neutrophils Absolute: 13.4 10*3/uL — ABNORMAL HIGH (ref 1.4–7.0)
Neutrophils: 77 %
Platelets: 254 10*3/uL (ref 150–450)
RBC: 4.69 x10E6/uL (ref 3.77–5.28)
RDW: 13 % (ref 11.7–15.4)
WBC: 17.2 10*3/uL — ABNORMAL HIGH (ref 3.4–10.8)

## 2020-04-10 LAB — HEPATITIS C ANTIBODY: Hep C Virus Ab: <0.1 s/co ratio (ref 0.0–0.9)

## 2020-04-10 LAB — HIV ANTIBODY (ROUTINE TESTING W REFLEX): HIV Screen 4th Generation wRfx: NONREACTIVE

## 2020-04-11 ENCOUNTER — Other Ambulatory Visit: Payer: Self-pay | Admitting: Family

## 2020-04-11 DIAGNOSIS — G43701 Chronic migraine without aura, not intractable, with status migrainosus: Secondary | ICD-10-CM

## 2020-04-11 NOTE — Telephone Encounter (Signed)
Pharmacy comment: Alternative Requested:NOT COVERED.

## 2020-04-16 ENCOUNTER — Telehealth: Payer: Self-pay | Admitting: *Deleted

## 2020-04-16 ENCOUNTER — Telehealth: Payer: Self-pay | Admitting: Hematology and Oncology

## 2020-04-16 DIAGNOSIS — G43701 Chronic migraine without aura, not intractable, with status migrainosus: Secondary | ICD-10-CM

## 2020-04-16 LAB — TOXASSURE SELECT 13 (MW), URINE

## 2020-04-16 NOTE — Telephone Encounter (Signed)
Received a new hem referral from [facility] for leukocytosis. Kimberly Terrell has been cld and scheduled to see Dr. Leonides Schanz on 2/9 at 1pm. Appt date and time has been given to the pt's husband per her request.

## 2020-04-16 NOTE — Telephone Encounter (Signed)
PA in process   Key: [id number] - PA Case ID: [id number] - Rx #: [id number] Need help? Call us at [phone] Status Sent to Plan today Drug Emgality 120MG /ML auto-injectors (migraine) FormTricare Electronic PA Form [phone] NCPDP)

## 2020-04-17 ENCOUNTER — Emergency Department (HOSPITAL_COMMUNITY)
Admission: EM | Admit: 2020-04-17 | Discharge: 2020-04-17 | Disposition: A | Discharge status: Home / Self Care | Attending: Emergency Medicine | Admitting: Emergency Medicine

## 2020-04-17 ENCOUNTER — Encounter (HOSPITAL_COMMUNITY): Payer: Self-pay

## 2020-04-17 ENCOUNTER — Other Ambulatory Visit: Payer: Self-pay

## 2020-04-17 DIAGNOSIS — F17210 Nicotine dependence, cigarettes, uncomplicated: Secondary | ICD-10-CM | POA: Insufficient documentation

## 2020-04-17 DIAGNOSIS — R197 Diarrhea, unspecified: Secondary | ICD-10-CM | POA: Insufficient documentation

## 2020-04-17 DIAGNOSIS — R112 Nausea with vomiting, unspecified: Secondary | ICD-10-CM | POA: Diagnosis not present

## 2020-04-17 DIAGNOSIS — J45909 Unspecified asthma, uncomplicated: Secondary | ICD-10-CM | POA: Diagnosis not present

## 2020-04-17 LAB — URINALYSIS, ROUTINE W REFLEX MICROSCOPIC
Bilirubin Urine: NEGATIVE
Glucose, UA: NEGATIVE mg/dL
Hgb urine dipstick: NEGATIVE
Ketones, ur: NEGATIVE mg/dL
Leukocytes,Ua: NEGATIVE
Nitrite: NEGATIVE
Protein, ur: NEGATIVE mg/dL
Specific Gravity, Urine: 1.004 — ABNORMAL LOW (ref 1.005–1.030)
pH: 6 (ref 5.0–8.0)

## 2020-04-17 LAB — CBC
HCT: 41.3 % (ref 36.0–46.0)
Hemoglobin: 13.8 g/dL (ref 12.0–15.0)
MCH: 30.2 pg (ref 26.0–34.0)
MCHC: 33.4 g/dL (ref 30.0–36.0)
MCV: 90.4 fL (ref 80.0–100.0)
Platelets: 273 10*3/uL (ref 150–400)
RBC: 4.57 MIL/uL (ref 3.87–5.11)
RDW: 13.5 % (ref 11.5–15.5)
WBC: 16.5 10*3/uL — ABNORMAL HIGH (ref 4.0–10.5)
nRBC: 0 % (ref 0.0–0.2)

## 2020-04-17 LAB — COMPREHENSIVE METABOLIC PANEL
ALT: 27 U/L (ref 0–44)
AST: 26 U/L (ref 15–41)
Albumin: 4.2 g/dL (ref 3.5–5.0)
Alkaline Phosphatase: 77 U/L (ref 38–126)
Anion gap: 10 (ref 5–15)
BUN: 7 mg/dL (ref 6–20)
CO2: 24 mmol/L (ref 22–32)
Calcium: 9.4 mg/dL (ref 8.9–10.3)
Chloride: 104 mmol/L (ref 98–111)
Creatinine, Ser: 0.69 mg/dL (ref 0.44–1.00)
GFR, Estimated: >60 mL/min (ref >60)
Glucose, Bld: 91 mg/dL (ref 70–99)
Potassium: 4.1 mmol/L (ref 3.5–5.1)
Sodium: 138 mmol/L (ref 135–145)
Total Bilirubin: 0.7 mg/dL (ref 0.3–1.2)
Total Protein: 6.8 g/dL (ref 6.5–8.1)

## 2020-04-17 LAB — I-STAT BETA HCG BLOOD, ED (MC, WL, AP ONLY): I-stat hCG, quantitative: <5 m[IU]/mL (ref <5)

## 2020-04-17 LAB — LIPASE, BLOOD: Lipase: 22 U/L (ref 11–51)

## 2020-04-17 MED ORDER — DICYCLOMINE HCL 10 MG PO CAPS
20.0000 mg | ORAL_CAPSULE | Freq: Once | ORAL | Status: AC
  Administered 2020-04-17: 20 mg via ORAL
  Filled 2020-04-17: qty 2

## 2020-04-17 MED ORDER — DICYCLOMINE HCL 20 MG PO TABS: 20.0000 mg | ORAL_TABLET | Freq: Two times a day (BID) | ORAL | 0 refills | Status: DC

## 2020-04-17 MED ORDER — ONDANSETRON 4 MG PO TBDP
4.0000 mg | ORAL_TABLET | Freq: Once | ORAL | Status: AC
  Administered 2020-04-17: 4 mg via ORAL
  Filled 2020-04-17: qty 1

## 2020-04-17 MED ORDER — ONDANSETRON 4 MG PO TBDP: 4.0000 mg | ORAL_TABLET | Freq: Three times a day (TID) | ORAL | 0 refills | Status: DC | PRN

## 2020-04-17 NOTE — ED Provider Notes (Signed)
[facility] EMERGENCY DEPARTMENT Provider Note   CSN: [id number] Arrival date & time: 04/17/20  1128     History Chief Complaint  Patient presents with   Abdominal Pain    Kimberly Terrell is a 45 y.o. female.  HPI Patient is 45 year old female presenting today with no abdominal pain-this was misinterpreted in triage- however states that she has had diarrhea for 2 full weeks states that she has 5-6 episodes of diarrhea which she describes as very loose stool sometimes watery in consistency.  Episodes of emesis are infrequent she states 1-2 times per day and are nonbloody nonbilious and she states she only vomits with approximately mouthful.  She denies any abdominal pain fevers chills.  She states that she has had some blood that is bright red when she wipes but is not has any Blood in the toilet bowl.  She did recently have a course of antibiotics for a URI.  She is uncertain what she took.  She states it was not called clindamycin.  She has no history of C. difficile.  No recent travel.  No sick contacts that she knows of.  She denies any urinary symptoms such as frequency urgency or dysuria.  No other associated symptoms.  No aggravating mitigating factors.  She denies any medications prior to arrival.    Past Medical History:  Diagnosis Date   Anxiety    Arthritis    Asthma    Bursitis    Right Hip and Right Knee   Depression    H. pylori infection    s/p triple therapy x 2   Migraines     Patient Active Problem List   Diagnosis Date Noted   Controlled substance agreement signed 04/09/2020   Benzodiazepine dependence (HCC) 04/09/2020   Shoulder pain, bilateral 02/27/2020   Asthma 10/30/2019   Chronic hip pain 10/30/2019   TOA (tubo-ovarian abscess) 07/04/2019   IUD infection (HCC) 07/04/2019   Gastroesophageal reflux disease without esophagitis 05/04/2019   H. pylori infection 03/28/2019   Abnormal CT of the abdomen  03/28/2019   Chronic daily headache 10/12/2017   Allergic rhinitis 07/25/2017   Depression, recurrent (HCC) 04/04/2017   Migraine 04/04/2017   Bursitis of right hip 04/04/2017   GAD (generalized anxiety disorder) 04/04/2017   DUB (dysfunctional uterine bleeding) 04/04/2017    Past Surgical History:  Procedure Laterality Date   COSMETIC SURGERY  2008   breast implants      OB History    Gravida  2   Para  1   Term  1   Preterm      AB  1   Living  1     SAB  1   IAB      Ectopic      Multiple      Live Births  1           Family History  Problem Relation Age of Onset   Depression Mother    Depression Daughter    Colon cancer Neg Hx    Esophageal cancer Neg Hx    Gastric cancer Neg Hx     Social History   Tobacco Use   Smoking status: Current Every Day Smoker    Packs/day: 1.00    Types: Cigarettes   Smokeless tobacco: Never Used   Tobacco comment: Planning to quit 03/30/2019  Vaping Use   Vaping Use: Never used  Substance Use Topics   Alcohol use: No   Drug use:  No    Home Medications Prior to Admission medications   Medication Sig Start Date End Date Taking? Authorizing Provider  albuterol (VENTOLIN HFA) 108 (90 Base) MCG/ACT inhaler Inhale 2 puffs into the lungs every 6 (six) hours as needed for wheezing or shortness of breath. 01/31/20   [name], FNP  ALPRAZolam Prudy Feeler) 1 MG tablet Take 1 tablet (1 mg total) by mouth 3 (three) times daily. 04/09/20   Jannifer Rodney A, FNP  buPROPion (WELLBUTRIN XL) 300 MG 24 hr tablet Take 1 tablet (300 mg total) by mouth daily. 01/31/20   [name], FNP  cyclobenzaprine (FLEXERIL) 10 MG tablet Take 1 tablet (10 mg total) by mouth 3 (three) times daily as needed for muscle spasms. 01/31/20   Jannifer Rodney A, FNP  diclofenac sodium (VOLTAREN) 1 % GEL Apply 2 g topically 4 (four) times daily. 06/27/18   Daphine Deutscher, Mary-Margaret, FNP  DULoxetine (CYMBALTA) 60 MG capsule Take 1  capsule (60 mg total) by mouth daily. 01/31/20   Hawks, Christy A, FNP  Galcanezumab-gnlm (EMGALITY) 120 MG/ML SOAJ Inject 120 mg into the skin every 30 (thirty) days. 04/09/20   Jannifer Rodney A, FNP  Melatonin 10 MG CAPS Take 20 mg by mouth at bedtime.    [provider]  metoprolol succinate (TOPROL-XL) 25 MG 24 hr tablet TAKE 1 TABLET (25 MG TOTAL) BY MOUTH DAILY 04/10/20   Allred, Fayrene Fearing, MD  montelukast (SINGULAIR) 10 MG tablet Take 1 tablet (10 mg total) by mouth at bedtime. Patient taking differently: Take 10 mg by mouth at bedtime as needed (for respiratory flares). 05/04/19   Remus Loffler, PA-C  Oxcarbazepine (TRILEPTAL) 300 MG tablet TAKE 1 TABLET BY MOUTH AT BEDTIME 01/31/20   Hawks, Neysa Bonito A, FNP  pantoprazole (PROTONIX) 40 MG tablet Take 1 tablet (40 mg total) by mouth 2 (two) times daily before a meal. 01/31/20   Hawks, Neysa Bonito A, FNP  potassium chloride SA (KLOR-CON) 20 MEQ tablet Take 1 tablet (20 mEq total) by mouth 2 (two) times daily. 12/26/19   Mancel Bale, MD  SUMAtriptan (IMITREX) 50 MG tablet Take 1 tablet (50 mg total) by mouth once for 1 dose. May repeat ONCE in 2 hours if headache persists or recurs. 01/31/20 01/31/20  [name], FNP    Allergies    Tape, Aspirin, and Imitrex [sumatriptan]  Review of Systems   Review of Systems  Constitutional: Negative for chills and fever.  HENT: Negative for congestion.   Eyes: Negative for pain.  Respiratory: Negative for cough and shortness of breath.   Cardiovascular: Negative for chest pain and leg swelling.  Gastrointestinal: Positive for blood in stool (BRB with wiping), diarrhea, nausea and vomiting. Negative for abdominal pain.  Genitourinary: Negative for dysuria.  Musculoskeletal: Negative for myalgias.  Skin: Negative for rash.  Neurological: Negative for dizziness and headaches.    Physical Exam Updated Vital Signs BP 113/63 (BP Location: Left Arm)    Pulse 88    Temp 98.8 F (37.1 C) (Oral)    Resp  12    Ht 5\' 4"  (1.626 m)    Wt 72.6 kg    LMP 04/02/2020    SpO2 98%    BMI 27.46 kg/m   Physical Exam Vitals and nursing note reviewed.  Constitutional:      General: She is not in acute distress.    Comments: Pleasant well-appearing 45 year old.  In no acute distress.  Sitting comfortably in bed.  Able answer questions appropriately follow  commands. No increased work of breathing. Speaking in full sentences.   HENT:     Head: Normocephalic and atraumatic.     Nose: Nose normal.  Eyes:     General: No scleral icterus. Cardiovascular:     Rate and Rhythm: Normal rate and regular rhythm.     Pulses: Normal pulses.     Heart sounds: Normal heart sounds.  Pulmonary:     Effort: Pulmonary effort is normal. No respiratory distress.     Breath sounds: No wheezing.  Abdominal:     Palpations: Abdomen is soft.     Tenderness: There is no abdominal tenderness.     Comments: Soft nontender abdomen.  No CVA tenderness.  Musculoskeletal:     Cervical back: Normal range of motion.     Right lower leg: No edema.     Left lower leg: No edema.  Skin:    General: Skin is warm and dry.     Capillary Refill: Capillary refill takes less than 2 seconds.  Neurological:     Mental Status: She is alert. Mental status is at baseline.  Psychiatric:        Mood and Affect: Mood normal.        Behavior: Behavior normal.     ED Results / Procedures / Treatments   Labs (all labs ordered are listed, but only abnormal results are displayed) Labs Reviewed  CBC - Abnormal; Notable for the following components:      Result Value   WBC 16.5 (*)    All other components within normal limits  URINALYSIS, ROUTINE W REFLEX MICROSCOPIC - Abnormal; Notable for the following components:   Color, Urine STRAW (*)    Specific Gravity, Urine 1.004 (*)    All other components within normal limits  LIPASE, BLOOD  COMPREHENSIVE METABOLIC PANEL  I-STAT BETA HCG BLOOD, ED ([facility], [facility], [facility] ONLY)     EKG None  Radiology No results found.  Procedures Procedures (including critical care time)  Medications Ordered in ED Medications - No data to display  ED Course  I have reviewed the triage vital signs and the nursing notes.  Pertinent labs & imaging results that were available during my care of the patient were reviewed by me and considered in my medical decision making (see chart for details).    MDM Rules/Calculators/A&P                          Patient is well-appearing 45 year old female presented today with nausea vomiting diarrhea.  Episodes of emesis are infrequent she states 1-2 times per day and are nonbloody nonbilious and she states she only vomits with approximately mouthful.  She denies any abdominal pain fevers chills.  She states that she has had some blood that is bright red when she wipes but is not has any Blood in the toilet bowl.  She did recently have a course of antibiotics for a URI.  She is uncertain what she took.  She states it was not called clindamycin.  She has no history of C. difficile.  No recent travel.  No sick contacts that she knows of.  Physical exam is without any abdominal tenderness to palpation.  No guarding or rebound.  She is overall very well-appearing his vital signs within normal limits.  CMP without significant electrolyte derangements.  Potassium within normal limits.  CBC appears to be chronically elevated and is again today.  Patient states that she has  been referred to hematologist has not seen hematologist yet however.  Other blood cell counts are normal.    Urinalysis without any evidence of infection.  I-STAT ECG negative for pregnancy.  Lipase within normal limits.  GI pathogen panel ordered.  Patient will follow-up with a gastroenterologist also recommended follow-up with PCP.  Zofran and Bentyl.  She is tolerating p.o. without difficulty.  States that she is very hungry and would like to be discharged as times if she  can eat.  Has not had any episodes of diarrhea while here in the ER today for 10 hours.  Will discharge with Zofran and Bentyl as she had a good response to this.  We will follow-up with your PCP and gastroenterologist.  Final Clinical Impression(s) / ED Diagnoses Final diagnoses:  None    Rx / DC Orders ED Discharge Orders    None       Gailen Shelter, [place] 04/17/20 2352    Pollyann Savoy, MD 04/18/20 1014

## 2020-04-17 NOTE — ED Triage Notes (Signed)
Pt reports she is here today due to abd pain, diarrhea and N&V x2 weeks.

## 2020-04-17 NOTE — Telephone Encounter (Addendum)
Emgality was denied. It states that patient must be followed by or consulted by a neurologist.

## 2020-04-17 NOTE — Discharge Instructions (Addendum)
Please call to make an appoint with your primary care doctor to follow-up on your stool studies.  Please drink plenty of water as you are doing you may augment with Pedialyte use Zofran as needed for nausea you may take regularly every 8 hours as this will help with your diarrhea. Kimberly Terrell was return to the ER for any new or concerning symptoms.  Please follow-up with your hematologist for your elevated white blood cell count.

## 2020-04-17 NOTE — Telephone Encounter (Signed)
Emgality was denied, needs to be ordered by Neurologists. Neurologists referral placed.

## 2020-04-18 ENCOUNTER — Other Ambulatory Visit: Payer: Self-pay

## 2020-04-18 ENCOUNTER — Telehealth: Payer: Self-pay

## 2020-04-18 ENCOUNTER — Other Ambulatory Visit

## 2020-04-18 NOTE — Telephone Encounter (Signed)
Transition Care Management Follow-up Telephone Call   Date discharged?04/17/20   How have you been since you were released from the hospital? Not good,they did absolutely anything since I was there all was done was blood work and I still do not know what was wrong.  waiting room was in the [address], not an overall good experience.    Do you understand why you were in the hospital? yes   Do you understand the discharge instructions? no   Where were you discharged to? home   Items Reviewed:  Medications reviewed: no  Allergies reviewed: no  Dietary changes reviewed: no  Referrals reviewed: no       Any transportation issues/concerns?: no   Any patient concerns? yes   Confirmed importance and date/time of follow-up visits scheduled yes  Provider Appointment booked with  Confirmed with patient if condition begins to worsen call PCP or go to the ER.  Patient was given the office number and encouraged to call back with question or concerns.  : yes         Per patient she still does not know why her stomach was upset, on the call with me she stated she was on her way back to the hospital to drop off stool samples so hopefully this will give more clarity. Advised her to call her PCP if she had any further concerns

## 2020-04-18 NOTE — Telephone Encounter (Signed)
Pt informed and will contact Neurology. She also has been having frequent diarrhea and was seen in [facility] ER. Stool studies were ordered. Pt would like to bring the sample to our office so she does not have to drive back to ER lab. Informed pt that it should be fine to bring sample to our lab. She has the order and it was ordered through [facility]. Pt understood.

## 2020-04-19 LAB — GASTROINTESTINAL PANEL BY PCR, STOOL (REPLACES STOOL CULTURE)

## 2020-05-07 ENCOUNTER — Encounter: Admitting: Hematology and Oncology

## 2020-05-07 ENCOUNTER — Other Ambulatory Visit

## 2020-05-09 ENCOUNTER — Encounter: Admitting: Hematology and Oncology

## 2020-05-23 ENCOUNTER — Other Ambulatory Visit: Payer: Self-pay | Admitting: Family

## 2020-06-04 ENCOUNTER — Ambulatory Visit: Admitting: Orthopaedic Surgery

## 2020-06-04 ENCOUNTER — Other Ambulatory Visit: Payer: Self-pay

## 2020-06-18 ENCOUNTER — Encounter: Payer: Self-pay | Admitting: Orthopaedic Surgery

## 2020-06-18 ENCOUNTER — Other Ambulatory Visit: Payer: Self-pay

## 2020-06-18 ENCOUNTER — Ambulatory Visit (INDEPENDENT_AMBULATORY_CARE_PROVIDER_SITE_OTHER): Admitting: Orthopaedic Surgery

## 2020-06-18 VITALS — Ht 64.0 in | Wt 160.0 lb

## 2020-06-18 DIAGNOSIS — M7061 Trochanteric bursitis, right hip: Secondary | ICD-10-CM | POA: Diagnosis not present

## 2020-06-18 MED ORDER — BUPIVACAINE HCL 0.25 % IJ SOLN
2.0000 mL | INTRAMUSCULAR | Status: AC | PRN
  Administered 2020-06-18: 2 mL via INTRA_ARTICULAR

## 2020-06-18 MED ORDER — LIDOCAINE HCL 1 % IJ SOLN
2.0000 mL | INTRAMUSCULAR | Status: AC | PRN
  Administered 2020-06-18: 2 mL

## 2020-06-18 NOTE — Progress Notes (Signed)
Office Visit Note   Patient: Kimberly Terrell. [name]           Date of Birth: Sep 20, 1975           MRN: [id number] Visit Date: 06/18/2020              Requested by: [name], FNP [address] [place],  [place] [address] PCP: [name], FNP   Assessment & Plan: Visit Diagnoses:  1. Trochanteric bursitis of right hip     Plan: Recurrent symptoms of greater trochanteric bursitis right hip.  Will reinject with betamethasone.  At some point we may want to consider an MRI scan  Follow-Up Instructions: Return if symptoms worsen or fail to improve.   Orders:  Orders Placed This Encounter  Procedures  . Large Joint Inj: R greater trochanter   No orders of the defined types were placed in this encounter.     Procedures: Large Joint Inj: R greater trochanter on 06/18/2020 11:22 AM Indications: pain and diagnostic evaluation Details: 25 G 1.5 in needle  Arthrogram: No  Medications: 2 mL lidocaine 1 %; 2 mL bupivacaine 0.25 %  12 mg betamethasone injected into greater trochanter right hip with Xylocaine and Marcaine Procedure, treatment alternatives, risks and benefits explained, specific risks discussed. Consent was given by the patient. Immediately prior to procedure a time out was called to verify the correct patient, procedure, equipment, support staff and site/side marked as required. Patient was prepped and draped in the usual sterile fashion.       Clinical Data: No additional findings.   Subjective: Chief Complaint  Patient presents with  . Right Hip - Pain  Patient presents today for her right recurrent hip pain. She was here in December of 2021 and had her troch bursa injected with cortisone. She said that it has been hurting again for a month and wants to have it injected again. She takes Hydrocodone as needed for pain relief.   HPI  Review of Systems   Objective: Vital Signs: Ht 5\' 4"  (1.626 m)   Wt 160 lb (72.6 kg)   BMI 27.46 kg/m    Physical Exam Constitutional:      Appearance: She is well-developed.  Eyes:     Pupils: Pupils are equal, round, and reactive to light.  Pulmonary:     Effort: Pulmonary effort is normal.  Skin:    General: Skin is warm and dry.  Neurological:     Mental Status: She is alert and oriented to person, place, and time.  Psychiatric:        Behavior: Behavior normal.     Ortho Exam right hip with localized tenderness at the tip of the greater trochanter.  No crepitation.  No skin changes.  No pain with internal or external rotation in the groin.  Straight leg raise negative.  Specialty Comments:  No specialty comments available.  Imaging: No results found.   PMFS History: Patient Active Problem List   Diagnosis Date Noted  . Controlled substance agreement signed 04/09/2020  . Benzodiazepine dependence (HCC) 04/09/2020  . Shoulder pain, bilateral 02/27/2020  . Asthma 10/30/2019  . Chronic hip pain 10/30/2019  . TOA (tubo-ovarian abscess) 07/04/2019  . IUD infection (HCC) 07/04/2019  . Gastroesophageal reflux disease without esophagitis 05/04/2019  . H. pylori infection 03/28/2019  . Abnormal CT of the abdomen 03/28/2019  . Chronic daily headache 10/12/2017  . Allergic rhinitis 07/25/2017  . Depression, recurrent (HCC) 04/04/2017  . Migraine 04/04/2017  .  Bursitis of right hip 04/04/2017  . GAD (generalized anxiety disorder) 04/04/2017  . DUB (dysfunctional uterine bleeding) 04/04/2017   Past Medical History:  Diagnosis Date  . Anxiety   . Arthritis   . Asthma   . Bursitis    Right Hip and Right Knee  . Depression   . H. pylori infection    s/p triple therapy x 2  . Migraines     Family History  Problem Relation Age of Onset  . Depression Mother   . Depression Daughter   . Colon cancer Neg Hx   . Esophageal cancer Neg Hx   . Gastric cancer Neg Hx     Past Surgical History:  Procedure Laterality Date  . COSMETIC SURGERY  2008   breast implants     Social History   Occupational History  . Occupation: [employer]  . Smoking status: Current Every Day Smoker    Packs/day: 1.00    Types: Cigarettes  . Smokeless tobacco: Never Used  . Tobacco comment: Planning to quit 03/30/2019  Vaping Use  . Vaping Use: Never used  Substance and Sexual Activity  . Alcohol use: No  . Drug use: No  . Sexual activity: Yes    Birth control/protection: I.U.D., Surgical    Comment: husband has had a vasectomy

## 2020-07-02 DIAGNOSIS — Z029 Encounter for administrative examinations, unspecified: Secondary | ICD-10-CM

## 2020-07-07 ENCOUNTER — Other Ambulatory Visit: Payer: Self-pay | Admitting: Family

## 2020-07-08 ENCOUNTER — Encounter: Payer: Self-pay | Admitting: Family

## 2020-07-08 ENCOUNTER — Other Ambulatory Visit: Payer: Self-pay

## 2020-07-08 ENCOUNTER — Ambulatory Visit (INDEPENDENT_AMBULATORY_CARE_PROVIDER_SITE_OTHER): Admitting: Family

## 2020-07-08 VITALS — BP 119/70 | HR 89 | Temp 97.8°F | Ht 64.0 in | Wt 167.2 lb

## 2020-07-08 DIAGNOSIS — F339 Major depressive disorder, recurrent, unspecified: Secondary | ICD-10-CM

## 2020-07-08 DIAGNOSIS — K219 Gastro-esophageal reflux disease without esophagitis: Secondary | ICD-10-CM | POA: Diagnosis not present

## 2020-07-08 DIAGNOSIS — M25551 Pain in right hip: Secondary | ICD-10-CM

## 2020-07-08 DIAGNOSIS — G8929 Other chronic pain: Secondary | ICD-10-CM

## 2020-07-08 DIAGNOSIS — J4530 Mild persistent asthma, uncomplicated: Secondary | ICD-10-CM

## 2020-07-08 DIAGNOSIS — D72829 Elevated white blood cell count, unspecified: Secondary | ICD-10-CM

## 2020-07-08 DIAGNOSIS — Z79899 Other long term (current) drug therapy: Secondary | ICD-10-CM | POA: Diagnosis not present

## 2020-07-08 DIAGNOSIS — F411 Generalized anxiety disorder: Secondary | ICD-10-CM

## 2020-07-08 DIAGNOSIS — M7061 Trochanteric bursitis, right hip: Secondary | ICD-10-CM

## 2020-07-08 DIAGNOSIS — F1320 Sedative, hypnotic or anxiolytic dependence, uncomplicated: Secondary | ICD-10-CM

## 2020-07-08 MED ORDER — ALPRAZOLAM 1 MG PO TABS: 1.0000 mg | ORAL_TABLET | Freq: Three times a day (TID) | ORAL | 2 refills | Status: DC

## 2020-07-08 NOTE — Addendum Note (Signed)
Addended by: Jannifer Rodney A on: 07/08/2020 01:09 PM   Modules accepted: Orders

## 2020-07-08 NOTE — Patient Instructions (Signed)
Leukocytosis Leukocytosis means that a person has more white blood cells than normal. White blood cells are made in the bone marrow. Bone marrow is the spongy tissue inside bones. The main job of white blood cells is to fight infection. Having too many white blood cells is a common condition. It can develop as a result of many types of medical problems. What are the causes? Leukocytosis may be caused by various conditions. In some cases, the bone marrow is normal but is still making too many white blood cells. This can be due to:  Infection.  Injury.  Physical stress.  Emotional stress.  Surgery.  Allergic reactions.  Tumors that do not start in the blood or bone marrow.  An inherited disease.  Certain medicines.  Pregnancy and labor. In other cases, a person may have a bone marrow disorder that is causing the body to make too many white blood cells. Bone marrow disorders include:  Leukemia. This is a type of blood cancer.  Myeloproliferative disorders. These disorders cause blood cells to grow abnormally. What are the signs or symptoms? Often, this condition causes no symptoms. Some people may have symptoms due to the medical condition that is causing their leukocytosis. These symptoms may include:  Bleeding.  Bruising.  Fever.  Night sweats.  Swollen lymph nodes.  An enlarged spleen.  Repeated infections.  Weakness.  Weight loss. How is this diagnosed? This condition is diagnosed with blood tests. It is often found when blood is tested as part of a routine physical exam. You may have other tests to help determine why you have too many white blood cells. These tests may include:  A complete blood count (CBC). This test measures all the types of blood cells in your body.  Chest X-rays, urine tests, or other tests to look for signs of infection.  Bone marrow aspiration. For this test, a needle is put into your bone. Cells from the bone marrow are removed  through the needle and examined under a microscope.  Other tests on the blood or bone marrow sample.  CT scan, bone scan, or other imaging tests.   How is this treated? Usually, treatment is not needed for leukocytosis. However, if an infection, cancer, bone marrow disorder, or other serious problem is causing your leukocytosis, it will need to be treated. Treatment may include:  Regular monitoring of your white blood cell count to look for changes.  Antibiotic medicine if you have a bacterial infection.  Bone marrow transplant. This treatment replaces your diseased bone marrow with healthy cells that will grow new bone marrow.  Chemotherapy or biological therapies such as the use of antibodies. These treatments may be used to kill cancer cells or to decrease the number of white blood cells. Follow these instructions at home: Medicines  Take over-the-counter and prescription medicines only as told by your health care provider.  If you were prescribed an antibiotic medicine, take it as told by your health care provider. Do not stop taking the antibiotic even if you start to feel better. Eating and drinking  Eat foods that are low in saturated fats and high in fiber. Eat plenty of fruits and vegetables.  Drink enough fluid to keep your urine pale yellow.  Limit your intake of caffeine and alcohol.   General instructions  Maintain a healthy weight. Ask your health care provider what weight is best for you.  Do 30 minutes of exercise at least 5 times each week. Check with your health care   provider before you start a new exercise routine.  Follow any safety precautions as told by your health care provider. This may be needed if you are at increased risk for infection or bleeding because of your condition.  Do not use any products that contain nicotine or tobacco, such as cigarettes, e-cigarettes, and chewing tobacco. If you need help quitting, ask your health care provider.  Keep all  follow-up visits as told by your health care provider. This is important. Contact a health care provider if you:  Feel weak or more tired than usual.  Develop chills, a cough, or nasal congestion.  Have a fever.  Lose weight without trying.  Have night sweats.  Bruise easily.  Have new or worsening symptoms. Get help right away if you:  Bleed more than normal.  Have chest pain.  Have trouble breathing.  Have uncontrolled nausea or vomiting.  Feel dizzy or light-headed. Summary  Leukocytosis means that a person has more white blood cells than normal.  This condition often causes no symptoms.  This condition may be caused by various conditions.  If an infection, cancer, bone marrow disorder, or other serious problem is causing your leukocytosis, it will need to be treated.  Keep all follow-up visits as told by your health care provider. This is important. This information is not intended to replace advice given to you by your health care provider. Make sure you discuss any questions you have with your health care provider. Document Revised: 12/08/2017 Document Reviewed: 12/08/2017 Elsevier Patient Education  2021 [employer]  

## 2020-07-08 NOTE — Progress Notes (Signed)
Subjective:    Patient ID: Kimberly Terrell, female    DOB: 1975-09-16, 45 y.o.   MRN: [id number]   Chief Complaint  Patient presents with  . Anxiety  . Depression  . Asthma    3 month check up   Pt presents to the office today tofor chronic follow up. She is followed by GI for GERD and hx H pylori.  She states this is better controlled at this time.   She is followed by Cardiologists for palpitations. She had a negative ECHO and Holter monitor that was negative for acute finding.   She is followed by Ortho for chronic left hip and gets steroid injections.  Anxiety Presents for follow-up visit. Symptoms include depressed mood, excessive worry, irritability, nervous/anxious behavior and restlessness. Symptoms occur occasionally. The severity of symptoms is moderate.   Her past medical history is significant for asthma.  Depression        This is a chronic problem.  The current episode started more than 1 year ago.   The problem occurs intermittently.  The problem has been waxing and waning since onset.  Associated symptoms include irritable and restlessness.  Associated symptoms include no helplessness, no hopelessness and not sad.  Past medical history includes anxiety.   Asthma She complains of cough and wheezing. This is a chronic problem. The current episode started more than 1 year ago. The problem occurs intermittently. The problem has been waxing and waning. Her past medical history is significant for asthma.  Hip Pain  The incident occurred more than 1 week ago. The pain is present in the left hip. The pain is moderate. The pain has been intermittent since onset.  Nicotine Dependence Presents for follow-up visit. Symptoms include irritability. Her urge triggers include company of smokers. The symptoms have been stable. She smokes 1 pack of cigarettes per day.      Review of Systems  Constitutional: Positive for irritability.  Respiratory: Positive for cough and  wheezing.   Psychiatric/Behavioral: Positive for depression. The patient is nervous/anxious.   All other systems reviewed and are negative.      Objective:   Physical Exam Vitals reviewed.  Constitutional:      General: She is irritable. She is not in acute distress.    Appearance: She is well-developed.  HENT:     Head: Normocephalic and atraumatic.     Right Ear: Tympanic membrane normal.     Left Ear: Tympanic membrane normal.  Eyes:     Pupils: Pupils are equal, round, and reactive to light.  Neck:     Thyroid: No thyromegaly.  Cardiovascular:     Rate and Rhythm: Normal rate and regular rhythm.     Heart sounds: Normal heart sounds. No murmur heard.   Pulmonary:     Effort: Pulmonary effort is normal. No respiratory distress.     Breath sounds: Normal breath sounds. No wheezing.  Abdominal:     General: Bowel sounds are normal. There is no distension.     Palpations: Abdomen is soft.     Tenderness: There is no abdominal tenderness.  Musculoskeletal:        General: No tenderness. Normal range of motion.     Cervical back: Normal range of motion and neck supple.  Skin:    General: Skin is warm and dry.  Neurological:     Mental Status: She is alert and oriented to person, place, and time.     Cranial Nerves: No cranial nerve  deficit.     Deep Tendon Reflexes: Reflexes are normal and symmetric.  Psychiatric:        Behavior: Behavior normal.        Thought Content: Thought content normal.        Judgment: Judgment normal.       BP 119/70   Pulse 89   Temp 97.8 F (36.6 C) (Temporal)   Ht 5' 4"  (1.626 m)   Wt 167 lb 3.2 oz (75.8 kg)   BMI 28.70 kg/m      Assessment & Plan:  Kimberly Terrell comes in today with chief complaint of Anxiety, Depression, and Asthma (3 month check up)   Diagnosis and orders addressed:  1. GAD (generalized anxiety disorder) - We can not increase xanax today Referral to behavorial health pending - ALPRAZolam (XANAX) 1  MG tablet; Take 1 tablet (1 mg total) by mouth 3 (three) times daily.  Dispense: 65 tablet; Refill: 2 - CBC with Differential/Platelet - BMP8+EGFR  2. Gastroesophageal reflux disease without esophagitis  - CBC with Differential/Platelet - BMP8+EGFR  3. Depression, recurrent ([place]) - CBC with Differential/Platelet - BMP8+EGFR  4. Controlled substance agreement signed - CBC with Differential/Platelet - BMP8+EGFR  5. Benzodiazepine dependence (HCC) - CBC with Differential/Platelet - BMP8+EGFR  6. Mild persistent asthma without complication - CBC with Differential/Platelet - BMP8+EGFR  7. Trochanteric bursitis of right hip - CBC with Differential/Platelet - BMP8+EGFR  8. Chronic pain of right hip - CBC with Differential/Platelet - BMP8+EGFR  9. Leukocytosis, unspecified type - CBC with Differential/Platelet - BMP8+EGFR   Labs pending Health Maintenance reviewed Diet and exercise encouraged  Follow up plan: 3 months    Evelina Dun, FNP

## 2020-07-09 LAB — BMP8+EGFR
BUN/Creatinine Ratio: 15 (ref 9–23)
BUN: 11 mg/dL (ref 6–24)
CO2: 21 mmol/L (ref 20–29)
Calcium: 9.7 mg/dL (ref 8.7–10.2)
Chloride: 101 mmol/L (ref 96–106)
Creatinine, Ser: 0.72 mg/dL (ref 0.57–1.00)
Glucose: 90 mg/dL (ref 65–99)
Potassium: 5.3 mmol/L — ABNORMAL HIGH (ref 3.5–5.2)
Sodium: 140 mmol/L (ref 134–144)
eGFR: 106 mL/min/{1.73_m2} (ref >59)

## 2020-07-09 LAB — CBC WITH DIFFERENTIAL/PLATELET
Basophils Absolute: 0.1 10*3/uL (ref 0.0–0.2)
Basos: 1 %
EOS (ABSOLUTE): 0.6 10*3/uL — ABNORMAL HIGH (ref 0.0–0.4)
Eos: 6 %
Hematocrit: 39.3 % (ref 34.0–46.6)
Hemoglobin: 13.2 g/dL (ref 11.1–15.9)
Immature Grans (Abs): 0 10*3/uL (ref 0.0–0.1)
Immature Granulocytes: 0 %
Lymphocytes Absolute: 2.1 10*3/uL (ref 0.7–3.1)
Lymphs: 22 %
MCH: 29.8 pg (ref 26.6–33.0)
MCHC: 33.6 g/dL (ref 31.5–35.7)
MCV: 89 fL (ref 79–97)
Monocytes Absolute: 0.9 10*3/uL (ref 0.1–0.9)
Monocytes: 9 %
Neutrophils Absolute: 6 10*3/uL (ref 1.4–7.0)
Neutrophils: 62 %
Platelets: 223 10*3/uL (ref 150–450)
RBC: 4.43 x10E6/uL (ref 3.77–5.28)
RDW: 12.9 % (ref 11.7–15.4)
WBC: 9.8 10*3/uL (ref 3.4–10.8)

## 2020-09-17 ENCOUNTER — Other Ambulatory Visit: Payer: Self-pay | Admitting: *Deleted

## 2020-09-17 DIAGNOSIS — Z1152 Encounter for screening for COVID-19: Secondary | ICD-10-CM

## 2020-10-07 ENCOUNTER — Ambulatory Visit: Admitting: Family

## 2020-10-17 ENCOUNTER — Telehealth: Payer: Self-pay | Admitting: Family

## 2020-10-17 DIAGNOSIS — F411 Generalized anxiety disorder: Secondary | ICD-10-CM

## 2020-10-17 NOTE — Telephone Encounter (Signed)
Pt informed and understood that she will have to wait until her appt in the office next Wednesday. Advised to make her appts for example if need to come back in three months. Schedule the appt for 2.77months.

## 2020-10-22 ENCOUNTER — Telehealth: Payer: Self-pay | Admitting: Family Medicine

## 2020-10-22 ENCOUNTER — Ambulatory Visit (INDEPENDENT_AMBULATORY_CARE_PROVIDER_SITE_OTHER): Admitting: Family

## 2020-10-22 ENCOUNTER — Other Ambulatory Visit: Payer: Self-pay

## 2020-10-22 ENCOUNTER — Encounter: Payer: Self-pay | Admitting: Family

## 2020-10-22 ENCOUNTER — Other Ambulatory Visit: Payer: Self-pay | Admitting: Family

## 2020-10-22 VITALS — BP 110/72 | HR 86 | Temp 97.7°F | Ht 64.0 in | Wt 156.0 lb

## 2020-10-22 DIAGNOSIS — F339 Major depressive disorder, recurrent, unspecified: Secondary | ICD-10-CM | POA: Diagnosis not present

## 2020-10-22 DIAGNOSIS — J4530 Mild persistent asthma, uncomplicated: Secondary | ICD-10-CM

## 2020-10-22 DIAGNOSIS — F1320 Sedative, hypnotic or anxiolytic dependence, uncomplicated: Secondary | ICD-10-CM

## 2020-10-22 DIAGNOSIS — F411 Generalized anxiety disorder: Secondary | ICD-10-CM

## 2020-10-22 DIAGNOSIS — R112 Nausea with vomiting, unspecified: Secondary | ICD-10-CM

## 2020-10-22 DIAGNOSIS — G43701 Chronic migraine without aura, not intractable, with status migrainosus: Secondary | ICD-10-CM

## 2020-10-22 DIAGNOSIS — K219 Gastro-esophageal reflux disease without esophagitis: Secondary | ICD-10-CM

## 2020-10-22 DIAGNOSIS — A048 Other specified bacterial intestinal infections: Secondary | ICD-10-CM

## 2020-10-22 DIAGNOSIS — M25511 Pain in right shoulder: Secondary | ICD-10-CM

## 2020-10-22 DIAGNOSIS — Z9114 Patient's other noncompliance with medication regimen: Secondary | ICD-10-CM

## 2020-10-22 DIAGNOSIS — J309 Allergic rhinitis, unspecified: Secondary | ICD-10-CM

## 2020-10-22 DIAGNOSIS — M25512 Pain in left shoulder: Secondary | ICD-10-CM

## 2020-10-22 MED ORDER — MONTELUKAST SODIUM 10 MG PO TABS: 10.0000 mg | ORAL_TABLET | Freq: Every evening | ORAL | 0 refills | Status: DC | PRN

## 2020-10-22 MED ORDER — BUPROPION HCL ER (XL) 300 MG PO TB24: 300.0000 mg | ORAL_TABLET | Freq: Every day | ORAL | 0 refills | Status: DC

## 2020-10-22 MED ORDER — ALBUTEROL SULFATE HFA 108 (90 BASE) MCG/ACT IN AERS
2.0000 | INHALATION_SPRAY | Freq: Four times a day (QID) | RESPIRATORY_TRACT | 0 refills | Status: DC | PRN
Start: 2020-10-22 — End: 2023-03-15

## 2020-10-22 MED ORDER — SUMATRIPTAN SUCCINATE 50 MG PO TABS: 50.0000 mg | ORAL_TABLET | Freq: Once | ORAL | 0 refills | Status: DC

## 2020-10-22 MED ORDER — METOPROLOL SUCCINATE ER 25 MG PO TB24: 25.0000 mg | ORAL_TABLET | Freq: Every day | ORAL | 0 refills | Status: DC

## 2020-10-22 MED ORDER — DICYCLOMINE HCL 20 MG PO TABS: 20.0000 mg | ORAL_TABLET | Freq: Two times a day (BID) | ORAL | 0 refills | Status: DC

## 2020-10-22 MED ORDER — DULOXETINE HCL 60 MG PO CPEP: 60.0000 mg | ORAL_CAPSULE | Freq: Every day | ORAL | 0 refills | Status: DC

## 2020-10-22 MED ORDER — PANTOPRAZOLE SODIUM 40 MG PO TBEC: 40.0000 mg | DELAYED_RELEASE_TABLET | Freq: Two times a day (BID) | ORAL | 0 refills | Status: DC

## 2020-10-22 MED ORDER — ALPRAZOLAM 0.5 MG PO TABS: ORAL_TABLET | ORAL | 0 refills | Status: DC

## 2020-10-22 NOTE — Telephone Encounter (Signed)
Please see last office visit note and disregard this message Dr. Louanne Skye.

## 2020-10-22 NOTE — Patient Instructions (Signed)

## 2020-10-22 NOTE — Progress Notes (Signed)
Subjective:    Patient ID: Kimberly Terrell. Lebo, female    DOB: 17-Jan-1976, 45 y.o.   MRN: [id number]  Chief Complaint  Patient presents with   Medical Management of Chronic Issues   Pt presents to the office today to for chronic follow up. PT very agitated and angry today. She reports she refuses a contract today, because she wants another PCP. Her xanax was decreased to #65 from #90.    She is followed by Cardiologists just as needed for palpitations. She had a negative ECHO and Holter monitor that was negative for acute finding.   She is followed by Ortho for chronic left hip and gets steroid injections.  Asthma There is no cough, shortness of breath or wheezing. This is a chronic problem. The current episode started more than 1 year ago. The problem occurs intermittently. Associated symptoms include headaches and heartburn. She reports moderate improvement on treatment. Her past medical history is significant for asthma.  Headache  This is a chronic problem. The current episode started more than 1 year ago. The problem occurs daily. The problem has been waxing and waning. The pain quality is similar to prior headaches. Pertinent negatives include no coughing, nausea, phonophobia or photophobia. Treatments tried: has taken emgality, mild improvement.  Gastroesophageal Reflux She complains of belching and heartburn. She reports no coughing, no nausea or no wheezing. This is a chronic problem. The current episode started more than 1 year ago. The problem occurs occasionally. She has tried a PPI for the symptoms. The treatment provided moderate relief.  Anxiety Presents for follow-up visit. Symptoms include excessive worry, irritability, nervous/anxious behavior and restlessness. Patient reports no nausea or shortness of breath. Symptoms occur most days. The severity of symptoms is moderate.   Her past medical history is significant for asthma and depression.  Depression      The patient  presents with depression.  This is a chronic problem.  The current episode started more than 1 year ago.   The problem occurs intermittently.  Associated symptoms include helplessness, hopelessness, irritable, restlessness, headaches and sad.  Past medical history includes anxiety and depression.      Review of Systems  Constitutional:  Positive for irritability.  Eyes:  Negative for photophobia.  Respiratory:  Negative for cough, shortness of breath and wheezing.   Gastrointestinal:  Positive for heartburn. Negative for nausea.  Neurological:  Positive for headaches.  Psychiatric/Behavioral:  Positive for depression. The patient is nervous/anxious.   All other systems reviewed and are negative.     Objective:   Physical Exam Vitals reviewed.  Constitutional:      General: She is irritable. She is not in acute distress.    Appearance: She is well-developed.  HENT:     Head: Normocephalic and atraumatic.  Neurological:     Mental Status: She is alert and oriented to person, place, and time.     Deep Tendon Reflexes: Reflexes are normal and symmetric.  Psychiatric:        Mood and Affect: Mood is anxious. Affect is angry.        Behavior: Behavior normal.        Thought Content: Thought content normal.        Judgment: Judgment normal.   Pt refuses any physical exam stating "Do not touch me".    BP 110/72   Pulse 86   Temp 97.7 F (36.5 C) (Temporal)   Ht 5\' 4"  (1.626 m)   Wt 156 lb (70.8 kg)  SpO2 98%   BMI 26.78 kg/m      Assessment & Plan:  Devota Viruet. Ostroff comes in today with chief complaint of Medical Management of Chronic Issues   Diagnosis and orders addressed:  1. Depression, recurrent (HCC)  2. Chronic migraine without aura with status migrainosus, not intractable  3. GAD (generalized anxiety disorder)  4. Gastroesophageal reflux disease without esophagitis  5. Mild persistent asthma without complication  6. Controlled substance agreement  broken  7. Benzodiazepine dependence (HCC)  After discussing patient's xanax, she becomes very agitated and angry. She has been out of xanax for last 4 days because she missed her last appointment on 10/07/20. I will give her a month long tamper of her xanax for 30 days.  I discussed this is my license, and I will no longer prescribe her controlled medications. The risk of these medications include addiction, seizures, respiratory distress, sedation, coma, and death.   Pt had been prescribed xanax 1 mg TID #90 from a previous provider. When I became her PCP on 12/07/19 we decreased to Xanax 1 mg TID prn #75 with discussion we would tamper off. On 04/09/20 we decrease to Xanax 1 mg TID prn #65.  She had a normal toxassure on that visit. On 07/08/20, we discussed we could not increase her xanax and we would need to continue to decrease medication. I did send in Xanax 1 mg TID prn #65 and placed a referral to [facility]. We discussed that I would not be able to continue to prescribe her xanax long term at that visit and she needed to find a psychologists to manage her GAD and depression.  She refuses any physical exam and states "do not touch me". She states she will tell "everyone about you". She precedes to say I have ruin her life and takes her mask  off and gets a few inches from my face.  She stares at me for a few seconds and I give her paperwork and show her the exit.   Patient will be dismissed from clinic for inappropriate behavior.  All medication refilled for 30 days.   Jannifer Rodney, FNP

## 2020-10-23 ENCOUNTER — Encounter: Payer: Self-pay | Admitting: *Deleted

## 2020-10-24 ENCOUNTER — Encounter: Payer: Self-pay | Admitting: Family

## 2020-11-01 ENCOUNTER — Ambulatory Visit (HOSPITAL_COMMUNITY): Payer: Self-pay | Admitting: Psychiatry

## 2020-11-16 ENCOUNTER — Other Ambulatory Visit: Payer: Self-pay | Admitting: Family

## 2020-11-16 DIAGNOSIS — J309 Allergic rhinitis, unspecified: Secondary | ICD-10-CM

## 2021-01-03 ENCOUNTER — Other Ambulatory Visit: Payer: Self-pay | Admitting: Family

## 2021-01-03 DIAGNOSIS — F339 Major depressive disorder, recurrent, unspecified: Secondary | ICD-10-CM

## 2021-01-03 DIAGNOSIS — F411 Generalized anxiety disorder: Secondary | ICD-10-CM

## 2021-01-03 DIAGNOSIS — K219 Gastro-esophageal reflux disease without esophagitis: Secondary | ICD-10-CM

## 2021-01-03 DIAGNOSIS — A048 Other specified bacterial intestinal infections: Secondary | ICD-10-CM

## 2021-01-03 DIAGNOSIS — R112 Nausea with vomiting, unspecified: Secondary | ICD-10-CM

## 2021-01-19 ENCOUNTER — Other Ambulatory Visit: Payer: Self-pay | Admitting: Family

## 2021-01-19 DIAGNOSIS — F411 Generalized anxiety disorder: Secondary | ICD-10-CM

## 2021-01-19 DIAGNOSIS — F339 Major depressive disorder, recurrent, unspecified: Secondary | ICD-10-CM

## 2021-02-08 ENCOUNTER — Other Ambulatory Visit: Payer: Self-pay | Admitting: Family

## 2021-02-08 DIAGNOSIS — F339 Major depressive disorder, recurrent, unspecified: Secondary | ICD-10-CM

## 2021-04-21 ENCOUNTER — Other Ambulatory Visit: Payer: Self-pay | Admitting: Internal Medicine

## 2021-08-10 ENCOUNTER — Other Ambulatory Visit: Payer: Self-pay | Admitting: Internal Medicine

## 2021-08-24 ENCOUNTER — Other Ambulatory Visit: Payer: Self-pay | Admitting: Internal Medicine

## 2021-09-23 ENCOUNTER — Ambulatory Visit (INDEPENDENT_AMBULATORY_CARE_PROVIDER_SITE_OTHER)

## 2021-09-23 ENCOUNTER — Encounter: Payer: Self-pay | Admitting: Orthopaedic Surgery

## 2021-09-23 ENCOUNTER — Ambulatory Visit (INDEPENDENT_AMBULATORY_CARE_PROVIDER_SITE_OTHER): Admitting: Orthopaedic Surgery

## 2021-09-23 VITALS — Ht 63.0 in | Wt 145.0 lb

## 2021-09-23 DIAGNOSIS — M21612 Bunion of left foot: Secondary | ICD-10-CM | POA: Insufficient documentation

## 2021-09-23 DIAGNOSIS — M79672 Pain in left foot: Secondary | ICD-10-CM | POA: Diagnosis not present

## 2021-09-23 DIAGNOSIS — G5761 Lesion of plantar nerve, right lower limb: Secondary | ICD-10-CM | POA: Insufficient documentation

## 2021-09-23 MED ORDER — BUPIVACAINE HCL 0.25 % IJ SOLN
1.0000 mL | INTRAMUSCULAR | Status: AC | PRN
  Administered 2021-09-23: 1 mL

## 2021-09-23 NOTE — Progress Notes (Signed)
Office Visit Note   Patient: Kimberly Terrell. [name]           Date of Birth: 1976/01/28           MRN: [id number] Visit Date: 09/23/2021              Requested by: [name], FNP [id number] [place],  [place] [address] PCP: [name], FNP   Assessment & Plan: Visit Diagnoses:  1. Pain in left foot   2. Morton's neuroma of right foot   3. Bunion of left foot     Plan: Ms. Alred visit the office evaluation of both right and left foot pain.  She has noted some widening of the second and third toes on the right foot with some discomfort at the webspace.  Appears that she has a Morton's neuroma.  X-rays were nondiagnostic.  Will inject that space with betamethasone and Marcaine and monitor response.  Also has pronation of both feet I think she would be better off having full arch supports.  Will prescribe.  Has a very small bunion of left foot without any crossover or deformity.  No evidence of hallux rigidus.  Think she will do fine with the arch supports.  No present treatment for the bunion as it appears to be quite small  Follow-Up Instructions: Return if symptoms worsen or fail to improve.   Orders:  Orders Placed This Encounter  Procedures   XR Foot Complete Left   No orders of the defined types were placed in this encounter.     Procedures: Foot Inj  Date/Time: 09/23/2021 4:17 PM  Performed by: Valeria Batman, MD Authorized by: Valeria Batman, MD   Consent Given by:  Patient Indications:  Neuroma Condition: Morton's Neuroma   Location:  R foot Needle Size:  25 G Approach:  Dorsal Medications:  1 mL bupivacaine 0.25 %  6 mg betamethasone injected with Marcaine into the second webspace right foot presumed Morton's neuroma    Clinical Data: No additional findings.   Subjective: Chief Complaint  Patient presents with   Left Foot - Pain    My left foot hurts and I don't know why. Nothing seems to help.   Right Foot - Pain    My right  foot hurts more than my left foot, don't remember doing anything to either one of them.  Chronic problem with pain in both feet.  Has noted some spreading of the second and third toes right foot with some burning pain between the inner aspect of the second and third toes.  No injury or trauma.  Notes some pain along the medial aspect of the left great toe and is concerned that she may have a bunion.  Has been wearing good comfortable shoes but notes that she would like to wear sandals in the summer.  HPI  Review of Systems   Objective: Vital Signs: Ht 5\' 3"  (1.6 m)   Wt 145 lb (65.8 kg)   BMI 25.69 kg/m   Physical Exam Constitutional:      Appearance: She is well-developed.  Eyes:     Pupils: Pupils are equal, round, and reactive to light.  Pulmonary:     Effort: Pulmonary effort is normal.  Skin:    General: Skin is warm and dry.  Neurological:     Mental Status: She is alert and oriented to person, place, and time.  Psychiatric:        Behavior: Behavior normal.  Ortho Exam right foot with some swelling between the second and third toes.  There is some widening of the 2 toes and some burning with compression consistent with a Morton's neuroma.  Has good sensation.  Normal capillary refill.  Does pronate bilaterally a little more on the right than the left.  No arch or midfoot pain.  Left foot with a very small bunion but no deformity of the great toe.  There is no crossover.  Neurologically intact.  No evidence of hallux rigidus.  No hindfoot or midfoot discomfort.  Specialty Comments:  No specialty comments available.  Imaging: XR Foot Complete Left  Result Date: 09/23/2021 Films of the left foot were obtained in several projections.  No acute changes.  No subluxation.  No obvious arthritis.  No evidence of loss of arch.  No osteophytes about the os calcis.  No degenerative changes in the midfoot.  Films are benign    PMFS History: Patient Active Problem List    Diagnosis Date Noted   Morton's neuroma of right foot 09/23/2021   Bunion of left foot 09/23/2021   Controlled substance agreement broken 04/09/2020   Benzodiazepine dependence (HCC) 04/09/2020   Shoulder pain, bilateral 02/27/2020   Asthma 10/30/2019   Chronic hip pain 10/30/2019   TOA (tubo-ovarian abscess) 07/04/2019   IUD infection (HCC) 07/04/2019   Gastroesophageal reflux disease without esophagitis 05/04/2019   H. pylori infection 03/28/2019   Abnormal CT of the abdomen 03/28/2019   Chronic daily headache 10/12/2017   Allergic rhinitis 07/25/2017   Depression, recurrent (HCC) 04/04/2017   Migraine 04/04/2017   Bursitis of right hip 04/04/2017   GAD (generalized anxiety disorder) 04/04/2017   DUB (dysfunctional uterine bleeding) 04/04/2017   Past Medical History:  Diagnosis Date   Anxiety    Arthritis    Asthma    Bursitis    Right Hip and Right Knee   Depression    H. pylori infection    s/p triple therapy x 2   Migraines     Family History  Problem Relation Age of Onset   Depression Mother    Depression Daughter    Colon cancer Neg Hx    Esophageal cancer Neg Hx    Gastric cancer Neg Hx     Past Surgical History:  Procedure Laterality Date   COSMETIC SURGERY  2008   breast implants    Social History   Occupational History   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, advertising  Tobacco Use   Smoking status: Every Day    Packs/day: 1.00    Types: Cigarettes   Smokeless tobacco: Never   Tobacco comments:    Planning to quit 03/30/2019  Vaping Use   Vaping Use: Never used  Substance and Sexual Activity   Alcohol use: No   Drug use: No   Sexual activity: Yes    Birth control/protection: I.U.D., Surgical    Comment: husband has had a vasectomy     Valeria Batman, MD   Note - This record has been created using Animal nutritionist.  Chart creation errors have been sought, but may not always  have been located. Such creation errors do not reflect on  the standard of  medical care.

## 2021-10-21 ENCOUNTER — Other Ambulatory Visit: Payer: Self-pay | Admitting: Family

## 2021-11-03 ENCOUNTER — Other Ambulatory Visit: Payer: Self-pay | Admitting: Family

## 2021-11-13 IMAGING — CT CT ABD-PELV W/ CM
2 of 5 series · 16 of 46 positions shown, 18 images · IV contrast (omnipaque)
Comparison: Similar second 6262

CLINICAL DATA: Right lower quadrant abdominal pain.

EXAM:
CT ABDOMEN AND PELVIS WITH CONTRAST
TECHNIQUE: Multidetector CT imaging of the abdomen and pelvis was performed
using the standard protocol following bolus administration of
intravenous contrast.
CONTRAST:  100mL OMNIPAQUE IOHEXOL 300 MG/ML  SOLN

[Series 2: abdomen 5.0 · axial · 0.80mm/px · z∈[-756,-341]mm · 13 of 95 slices shown, 15 images]
[im 6/95  soft-tissue]
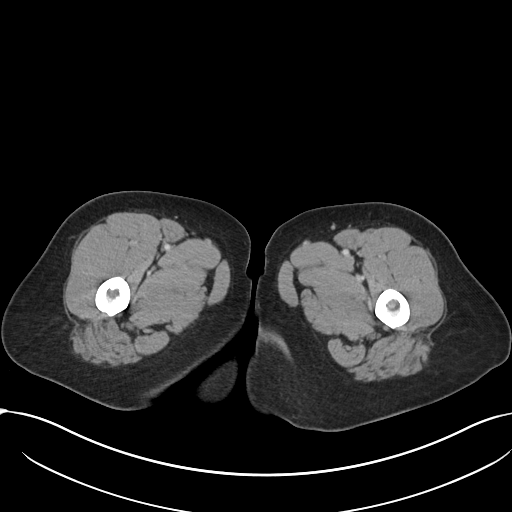
[im 6/95  bone]
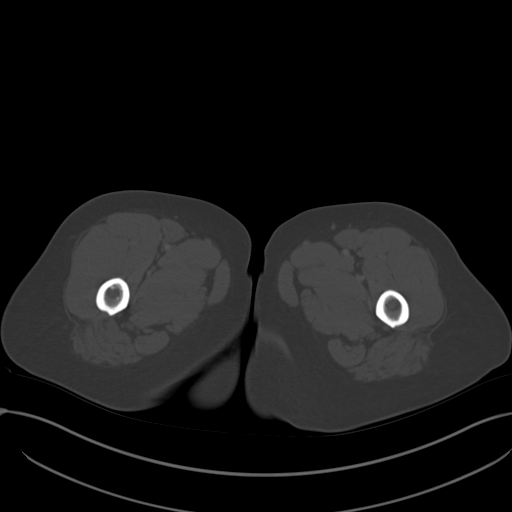
[im 12/95  soft-tissue]
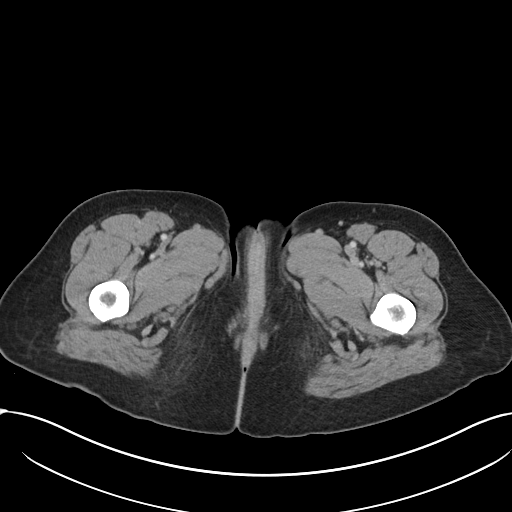
[im 23/95  soft-tissue]
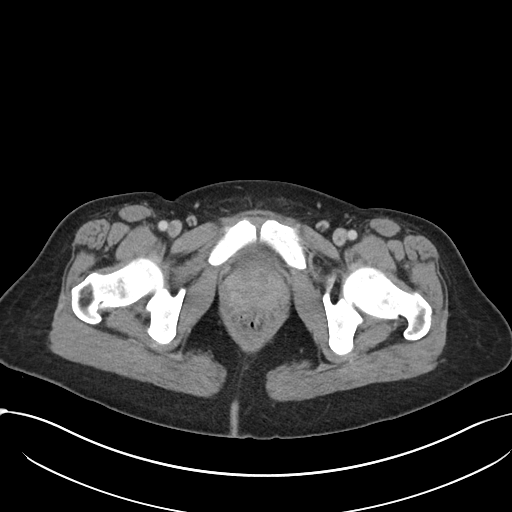
[im 28/95  soft-tissue]
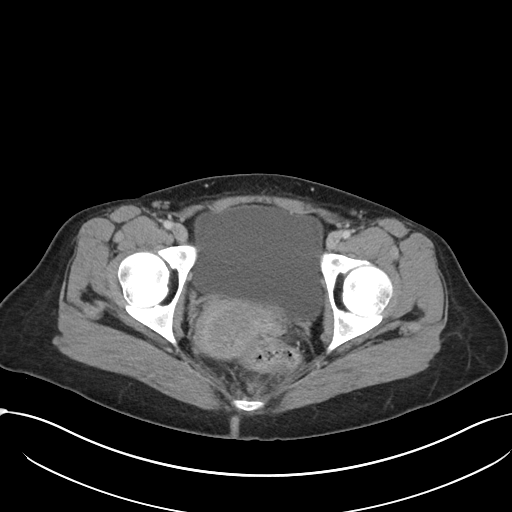
[im 34/95  soft-tissue]
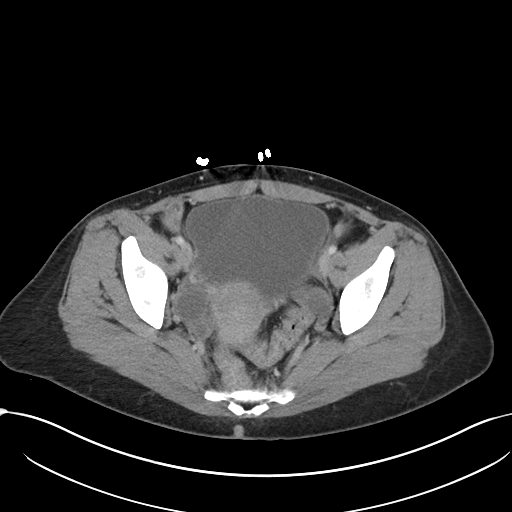
[im 39/95  soft-tissue]
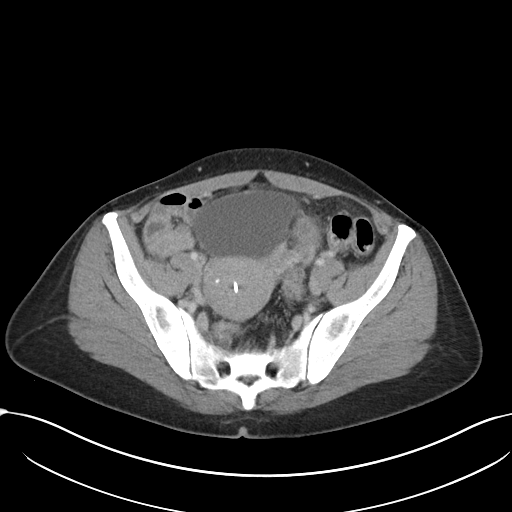
[im 50/95  soft-tissue]
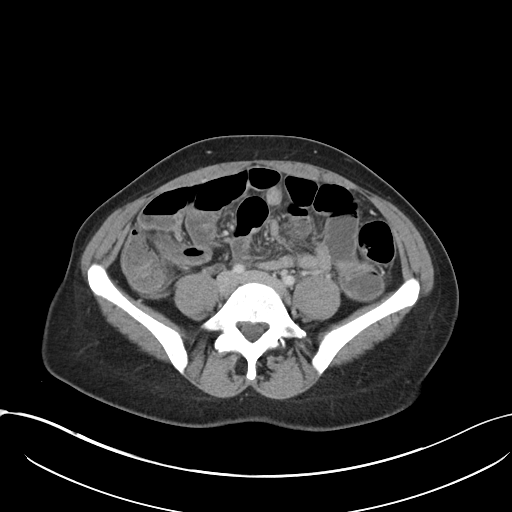
[im 56/95  soft-tissue]
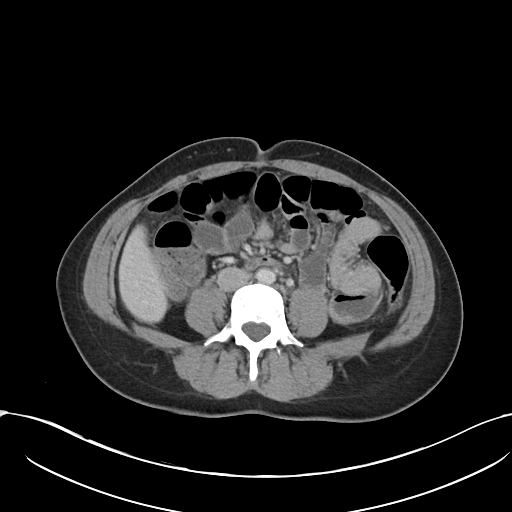
[im 61/95  soft-tissue]
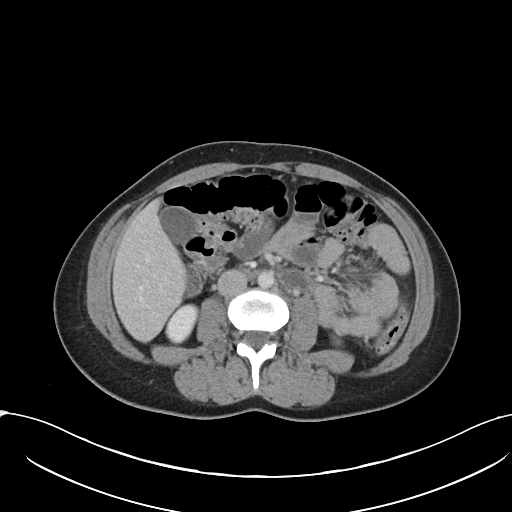
[im 61/95  bone]
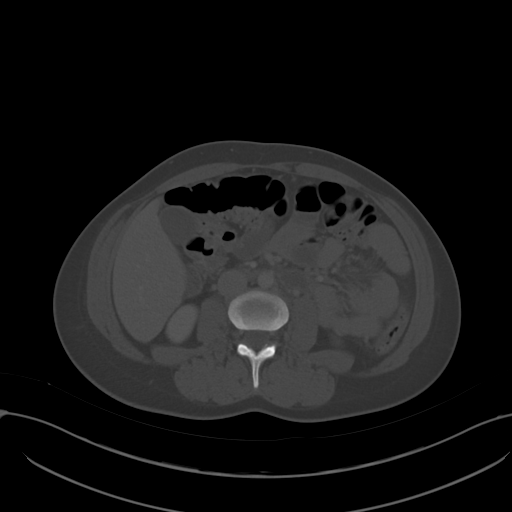
[im 67/95  soft-tissue]
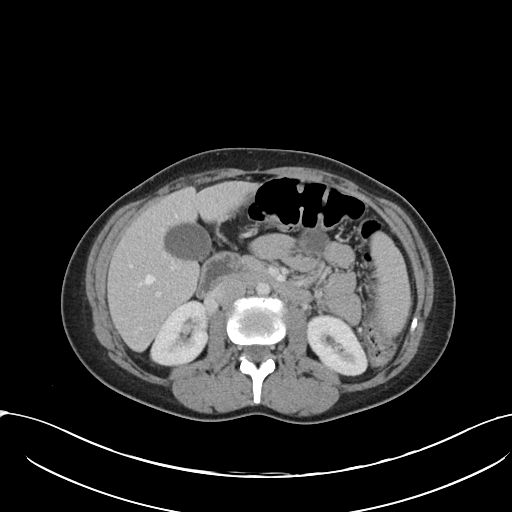
[im 72/95  soft-tissue]
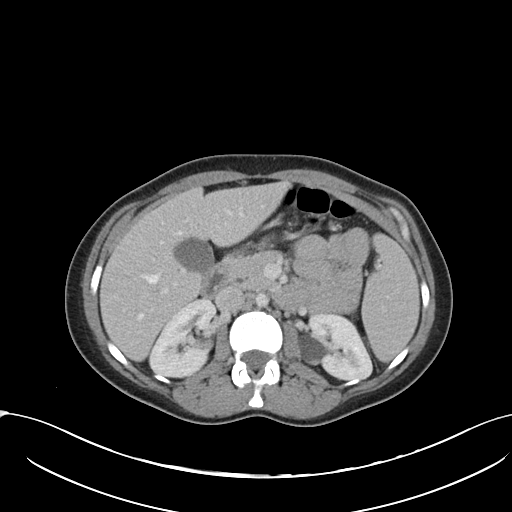
[im 83/95  soft-tissue]
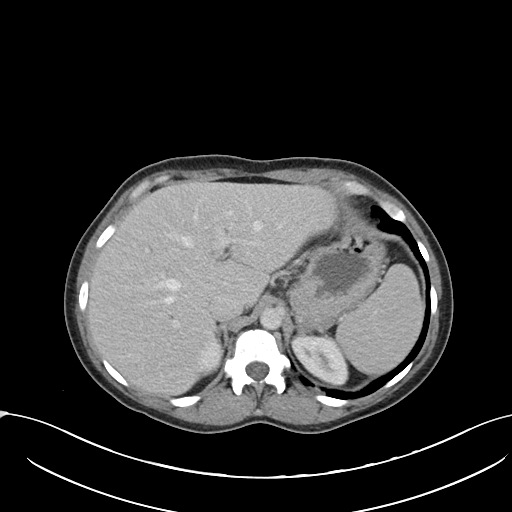
[im 89/95  soft-tissue]
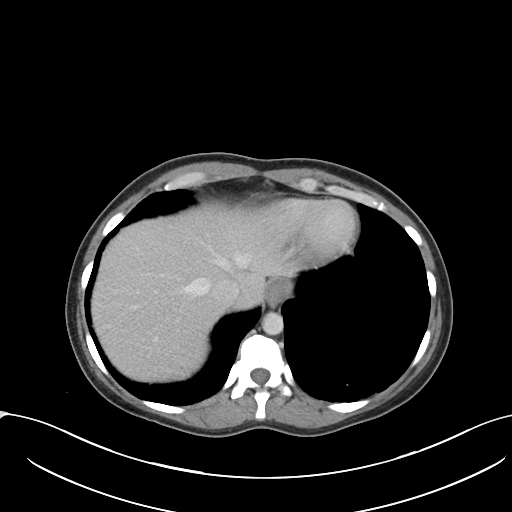

[Series 5: abdomen 3.0 mpr cor · coronal · 0.75mm/px · 3 of 92 slices shown]
[im 31/92  soft-tissue]
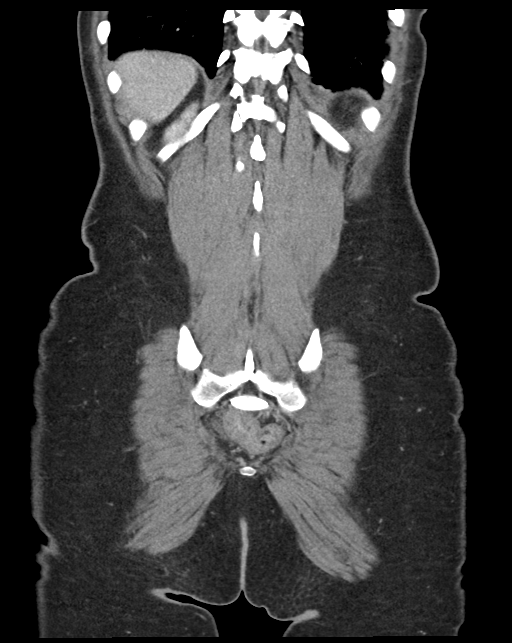
[im 41/92  soft-tissue]
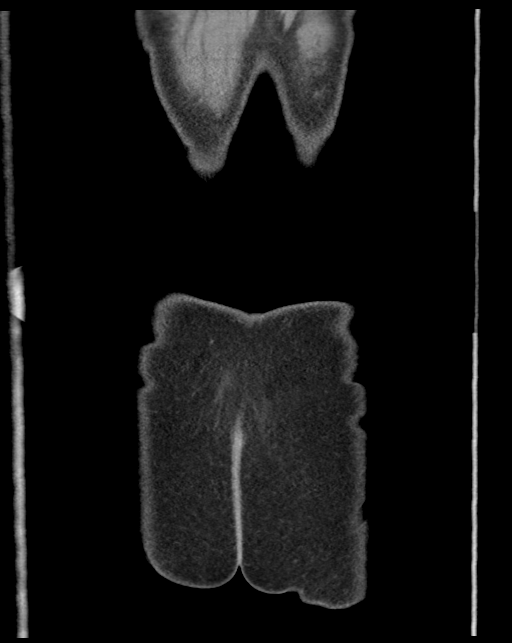
[im 51/92  soft-tissue]
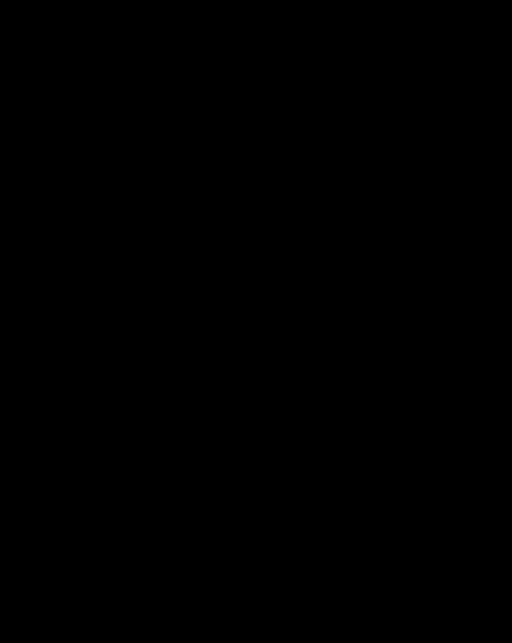

[16 of 46 positions shown; findings below may reference images not displayed]

FINDINGS: Lower chest: The lung bases are clear. The heart size is normal.

Hepatobiliary: The liver is normal. Normal gallbladder.There is no
biliary ductal dilation.

Pancreas: Normal contours without ductal dilatation. No
peripancreatic fluid collection.

Spleen: Unremarkable.

Adrenals/Urinary Tract:

--Adrenal glands: Unremarkable.

--Right kidney/ureter: No hydronephrosis or radiopaque kidney
stones.

--Left kidney/ureter: No hydronephrosis or radiopaque kidney stones.

--Urinary bladder: Unremarkable.

Stomach/Bowel:

--Stomach/Duodenum: No hiatal hernia or other gastric abnormality.
Normal duodenal course and caliber.

--Small bowel: There are few scattered mildly prominent fluid-filled
loops of small bowel noted throughout the abdomen.

--Colon: Unremarkable.

--Appendix: Normal.

Vascular/Lymphatic: Normal course and caliber of the major abdominal
vessels.

--No retroperitoneal lymphadenopathy.

--No mesenteric lymphadenopathy.

--No pelvic or inguinal lymphadenopathy.

Reproductive: There is a 4.7 x 2.6 cm fluid-filled structure in the
left adnexa with surrounding inflammatory changes. This structure
appears to be somewhat serpiginous on the axial views (axial series
2, image 54). There is an IUD in place. There is a 2.6 cm cystic
structure involving the right ovary. This may represent a
hemorrhagic cyst. There is a small cystic structure involving the
left ovary measuring 2 cm which may represent a dominant follicle.

Other: There is a small volume of pelvic free fluid which is likely
physiologic. No free air. The abdominal wall is normal.

Musculoskeletal. No acute displaced fractures.
IMPRESSION: 1. There is a 4.7 x 2.6 cm fluid-filled structure in the left adnexa
with surrounding inflammatory changes. This structure appears to be
somewhat serpiginous on the axial views. Findings are concerning for
left-sided tubo-ovarian abscess.
2. Normal appendix in the right lower quadrant.
3. Small volume of pelvic free fluid is likely physiologic or
reactive.

## 2021-12-03 ENCOUNTER — Inpatient Hospital Stay (HOSPITAL_COMMUNITY)
Admission: EM | Admit: 2021-12-03 | Discharge: 2021-12-15 | DRG: 326 | Disposition: A | Discharge status: Home / Self Care | Attending: Student in an Organized Health Care Education/Training Program | Admitting: Student in an Organized Health Care Education/Training Program

## 2021-12-03 ENCOUNTER — Other Ambulatory Visit: Payer: Self-pay

## 2021-12-03 ENCOUNTER — Encounter (HOSPITAL_COMMUNITY): Payer: Self-pay | Admitting: *Deleted

## 2021-12-03 DIAGNOSIS — D649 Anemia, unspecified: Secondary | ICD-10-CM | POA: Diagnosis present

## 2021-12-03 DIAGNOSIS — E785 Hyperlipidemia, unspecified: Secondary | ICD-10-CM | POA: Diagnosis not present

## 2021-12-03 DIAGNOSIS — I5021 Acute systolic (congestive) heart failure: Secondary | ICD-10-CM | POA: Diagnosis not present

## 2021-12-03 DIAGNOSIS — Z79899 Other long term (current) drug therapy: Secondary | ICD-10-CM | POA: Diagnosis not present

## 2021-12-03 DIAGNOSIS — I21A1 Myocardial infarction type 2: Secondary | ICD-10-CM | POA: Diagnosis present

## 2021-12-03 DIAGNOSIS — K651 Peritoneal abscess: Secondary | ICD-10-CM | POA: Diagnosis not present

## 2021-12-03 DIAGNOSIS — K219 Gastro-esophageal reflux disease without esophagitis: Secondary | ICD-10-CM | POA: Diagnosis present

## 2021-12-03 DIAGNOSIS — K567 Ileus, unspecified: Secondary | ICD-10-CM | POA: Diagnosis not present

## 2021-12-03 DIAGNOSIS — R0902 Hypoxemia: Secondary | ICD-10-CM | POA: Diagnosis not present

## 2021-12-03 DIAGNOSIS — J439 Emphysema, unspecified: Secondary | ICD-10-CM | POA: Diagnosis present

## 2021-12-03 DIAGNOSIS — R188 Other ascites: Secondary | ICD-10-CM | POA: Diagnosis not present

## 2021-12-03 DIAGNOSIS — R198 Other specified symptoms and signs involving the digestive system and abdomen: Secondary | ICD-10-CM | POA: Diagnosis present

## 2021-12-03 DIAGNOSIS — F419 Anxiety disorder, unspecified: Secondary | ICD-10-CM | POA: Diagnosis present

## 2021-12-03 DIAGNOSIS — J9811 Atelectasis: Secondary | ICD-10-CM | POA: Diagnosis not present

## 2021-12-03 DIAGNOSIS — J45909 Unspecified asthma, uncomplicated: Secondary | ICD-10-CM | POA: Diagnosis present

## 2021-12-03 DIAGNOSIS — I420 Dilated cardiomyopathy: Secondary | ICD-10-CM | POA: Diagnosis not present

## 2021-12-03 DIAGNOSIS — M1990 Unspecified osteoarthritis, unspecified site: Secondary | ICD-10-CM | POA: Diagnosis present

## 2021-12-03 DIAGNOSIS — F32A Depression, unspecified: Secondary | ICD-10-CM | POA: Diagnosis present

## 2021-12-03 DIAGNOSIS — Z886 Allergy status to analgesic agent status: Secondary | ICD-10-CM

## 2021-12-03 DIAGNOSIS — Z8619 Personal history of other infectious and parasitic diseases: Secondary | ICD-10-CM

## 2021-12-03 DIAGNOSIS — E860 Dehydration: Secondary | ICD-10-CM | POA: Diagnosis not present

## 2021-12-03 DIAGNOSIS — F17210 Nicotine dependence, cigarettes, uncomplicated: Secondary | ICD-10-CM | POA: Diagnosis present

## 2021-12-03 DIAGNOSIS — I319 Disease of pericardium, unspecified: Secondary | ICD-10-CM | POA: Diagnosis not present

## 2021-12-03 DIAGNOSIS — I472 Ventricular tachycardia, unspecified: Secondary | ICD-10-CM | POA: Diagnosis present

## 2021-12-03 DIAGNOSIS — I5181 Takotsubo syndrome: Secondary | ICD-10-CM | POA: Diagnosis present

## 2021-12-03 DIAGNOSIS — Z818 Family history of other mental and behavioral disorders: Secondary | ICD-10-CM

## 2021-12-03 DIAGNOSIS — I2489 Other forms of acute ischemic heart disease: Secondary | ICD-10-CM

## 2021-12-03 DIAGNOSIS — K3189 Other diseases of stomach and duodenum: Principal | ICD-10-CM

## 2021-12-03 DIAGNOSIS — K529 Noninfective gastroenteritis and colitis, unspecified: Secondary | ICD-10-CM | POA: Diagnosis present

## 2021-12-03 DIAGNOSIS — K255 Chronic or unspecified gastric ulcer with perforation: Secondary | ICD-10-CM | POA: Diagnosis not present

## 2021-12-03 DIAGNOSIS — E876 Hypokalemia: Secondary | ICD-10-CM | POA: Diagnosis not present

## 2021-12-03 DIAGNOSIS — Z801 Family history of malignant neoplasm of trachea, bronchus and lung: Secondary | ICD-10-CM

## 2021-12-03 DIAGNOSIS — I248 Other forms of acute ischemic heart disease: Secondary | ICD-10-CM

## 2021-12-03 DIAGNOSIS — Z888 Allergy status to other drugs, medicaments and biological substances status: Secondary | ICD-10-CM

## 2021-12-03 DIAGNOSIS — R911 Solitary pulmonary nodule: Secondary | ICD-10-CM | POA: Diagnosis present

## 2021-12-03 DIAGNOSIS — I493 Ventricular premature depolarization: Secondary | ICD-10-CM | POA: Diagnosis not present

## 2021-12-03 DIAGNOSIS — R5082 Postprocedural fever: Secondary | ICD-10-CM | POA: Diagnosis not present

## 2021-12-03 DIAGNOSIS — Z9882 Breast implant status: Secondary | ICD-10-CM

## 2021-12-03 DIAGNOSIS — K259 Gastric ulcer, unspecified as acute or chronic, without hemorrhage or perforation: Secondary | ICD-10-CM | POA: Diagnosis present

## 2021-12-03 DIAGNOSIS — D75839 Thrombocytosis, unspecified: Secondary | ICD-10-CM | POA: Diagnosis not present

## 2021-12-03 DIAGNOSIS — Z79890 Hormone replacement therapy (postmenopausal): Secondary | ICD-10-CM

## 2021-12-03 LAB — URINALYSIS, ROUTINE W REFLEX MICROSCOPIC
Bacteria, UA: NONE SEEN
Bilirubin Urine: NEGATIVE
Glucose, UA: NEGATIVE mg/dL
Hgb urine dipstick: NEGATIVE
Ketones, ur: 80 mg/dL — AB
Leukocytes,Ua: NEGATIVE
Nitrite: NEGATIVE
Protein, ur: 100 mg/dL — AB
Specific Gravity, Urine: 1.032 — ABNORMAL HIGH (ref 1.005–1.030)
pH: 5 (ref 5.0–8.0)

## 2021-12-03 LAB — COMPREHENSIVE METABOLIC PANEL
ALT: 18 U/L (ref 0–44)
AST: 21 U/L (ref 15–41)
Albumin: 3.3 g/dL — ABNORMAL LOW (ref 3.5–5.0)
Alkaline Phosphatase: 93 U/L (ref 38–126)
Anion gap: 15 (ref 5–15)
BUN: 12 mg/dL (ref 6–20)
CO2: 19 mmol/L — ABNORMAL LOW (ref 22–32)
Calcium: 9.7 mg/dL (ref 8.9–10.3)
Chloride: 104 mmol/L (ref 98–111)
Creatinine, Ser: 0.78 mg/dL (ref 0.44–1.00)
GFR, Estimated: >60 mL/min (ref >60)
Glucose, Bld: 122 mg/dL — ABNORMAL HIGH (ref 70–99)
Potassium: 3.4 mmol/L — ABNORMAL LOW (ref 3.5–5.1)
Sodium: 138 mmol/L (ref 135–145)
Total Bilirubin: 1.1 mg/dL (ref 0.3–1.2)
Total Protein: 6.2 g/dL — ABNORMAL LOW (ref 6.5–8.1)

## 2021-12-03 LAB — CBC WITH DIFFERENTIAL/PLATELET
Abs Immature Granulocytes: 0.07 10*3/uL (ref 0.00–0.07)
Basophils Absolute: 0.1 10*3/uL (ref 0.0–0.1)
Basophils Relative: 0 %
Eosinophils Absolute: 0.1 10*3/uL (ref 0.0–0.5)
Eosinophils Relative: 0 %
HCT: 40.4 % (ref 36.0–46.0)
Hemoglobin: 12.7 g/dL (ref 12.0–15.0)
Immature Granulocytes: 0 %
Lymphocytes Relative: 10 %
Lymphs Abs: 1.6 10*3/uL (ref 0.7–4.0)
MCH: 26.8 pg (ref 26.0–34.0)
MCHC: 31.4 g/dL (ref 30.0–36.0)
MCV: 85.2 fL (ref 80.0–100.0)
Monocytes Absolute: 0.8 10*3/uL (ref 0.1–1.0)
Monocytes Relative: 5 %
Neutro Abs: 13.5 10*3/uL — ABNORMAL HIGH (ref 1.7–7.7)
Neutrophils Relative %: 85 %
Platelets: 567 10*3/uL — ABNORMAL HIGH (ref 150–400)
RBC: 4.74 MIL/uL (ref 3.87–5.11)
RDW: 15.3 % (ref 11.5–15.5)
WBC: 16.1 10*3/uL — ABNORMAL HIGH (ref 4.0–10.5)
nRBC: 0 % (ref 0.0–0.2)

## 2021-12-03 LAB — LIPASE, BLOOD: Lipase: 22 U/L (ref 11–51)

## 2021-12-03 LAB — I-STAT BETA HCG BLOOD, ED (MC, WL, AP ONLY): I-stat hCG, quantitative: <5 m[IU]/mL (ref <5)

## 2021-12-03 NOTE — ED Triage Notes (Signed)
Pt arrived with [facility] for epigastric pain and NV for 1.5 days. Unable to tolerate fluids. 20g IV to L hand; 1L fluids and 4mg  zofran given. Hx of h. pylori

## 2021-12-03 NOTE — ED Provider Triage Note (Signed)
Emergency Medicine Provider Triage Evaluation Note  [name]. Kimberly Terrell , a 46 y.o. female  was evaluated in triage.  Pt complains of diffuse abdominal pain for the last few hours.  Was vomiting, no longer.  Denies any change in bowel habits, no previous abdominal surgeries..  Review of Systems  Per HPI  Physical Exam  There were no vitals taken for this visit. Gen:   Awake, no distress   Resp:  Normal effort  MSK:   Moves extremities without difficulty  Other:  Pale.  Diffuse abdominal tenderness  Medical Decision Making  Medically screening exam initiated at 9:52 PM.  Appropriate orders placed.  Sherrol R. Gillott was informed that the remainder of the evaluation will be completed by another provider, this initial triage assessment does not replace that evaluation, and the importance of remaining in the ED until their evaluation is complete.     Theron Arista, PA-C 12/03/21 [id number]

## 2021-12-04 ENCOUNTER — Encounter (HOSPITAL_COMMUNITY): Payer: Self-pay | Admitting: General Surgery

## 2021-12-04 ENCOUNTER — Emergency Department (HOSPITAL_COMMUNITY)

## 2021-12-04 ENCOUNTER — Encounter (HOSPITAL_COMMUNITY)
Admission: EM | Disposition: A | Discharge status: Home / Self Care | Payer: Self-pay | Source: Home / Self Care | Attending: Student in an Organized Health Care Education/Training Program

## 2021-12-04 ENCOUNTER — Emergency Department (HOSPITAL_COMMUNITY): Admitting: Anesthesiology

## 2021-12-04 DIAGNOSIS — R000 Tachycardia, unspecified: Secondary | ICD-10-CM

## 2021-12-04 DIAGNOSIS — I21A1 Myocardial infarction type 2: Secondary | ICD-10-CM | POA: Diagnosis present

## 2021-12-04 DIAGNOSIS — Z79899 Other long term (current) drug therapy: Secondary | ICD-10-CM | POA: Diagnosis not present

## 2021-12-04 DIAGNOSIS — K259 Gastric ulcer, unspecified as acute or chronic, without hemorrhage or perforation: Secondary | ICD-10-CM | POA: Diagnosis present

## 2021-12-04 DIAGNOSIS — R079 Chest pain, unspecified: Secondary | ICD-10-CM

## 2021-12-04 DIAGNOSIS — R112 Nausea with vomiting, unspecified: Secondary | ICD-10-CM | POA: Diagnosis not present

## 2021-12-04 DIAGNOSIS — Z01810 Encounter for preprocedural cardiovascular examination: Secondary | ICD-10-CM | POA: Diagnosis not present

## 2021-12-04 DIAGNOSIS — I248 Other forms of acute ischemic heart disease: Secondary | ICD-10-CM | POA: Diagnosis not present

## 2021-12-04 DIAGNOSIS — D75839 Thrombocytosis, unspecified: Secondary | ICD-10-CM | POA: Diagnosis present

## 2021-12-04 DIAGNOSIS — R198 Other specified symptoms and signs involving the digestive system and abdomen: Secondary | ICD-10-CM | POA: Diagnosis present

## 2021-12-04 DIAGNOSIS — I214 Non-ST elevation (NSTEMI) myocardial infarction: Secondary | ICD-10-CM

## 2021-12-04 DIAGNOSIS — I428 Other cardiomyopathies: Secondary | ICD-10-CM | POA: Diagnosis not present

## 2021-12-04 DIAGNOSIS — J439 Emphysema, unspecified: Secondary | ICD-10-CM | POA: Diagnosis present

## 2021-12-04 DIAGNOSIS — E785 Hyperlipidemia, unspecified: Secondary | ICD-10-CM | POA: Diagnosis present

## 2021-12-04 DIAGNOSIS — K651 Peritoneal abscess: Secondary | ICD-10-CM | POA: Diagnosis not present

## 2021-12-04 DIAGNOSIS — J9811 Atelectasis: Secondary | ICD-10-CM | POA: Diagnosis not present

## 2021-12-04 DIAGNOSIS — F32A Depression, unspecified: Secondary | ICD-10-CM | POA: Diagnosis present

## 2021-12-04 DIAGNOSIS — J45909 Unspecified asthma, uncomplicated: Secondary | ICD-10-CM | POA: Diagnosis present

## 2021-12-04 DIAGNOSIS — I420 Dilated cardiomyopathy: Secondary | ICD-10-CM | POA: Diagnosis present

## 2021-12-04 DIAGNOSIS — D649 Anemia, unspecified: Secondary | ICD-10-CM | POA: Diagnosis present

## 2021-12-04 DIAGNOSIS — R778 Other specified abnormalities of plasma proteins: Secondary | ICD-10-CM | POA: Diagnosis not present

## 2021-12-04 DIAGNOSIS — K251 Acute gastric ulcer with perforation: Secondary | ICD-10-CM | POA: Diagnosis not present

## 2021-12-04 DIAGNOSIS — E876 Hypokalemia: Secondary | ICD-10-CM | POA: Diagnosis present

## 2021-12-04 DIAGNOSIS — K631 Perforation of intestine (nontraumatic): Secondary | ICD-10-CM | POA: Diagnosis not present

## 2021-12-04 DIAGNOSIS — I5181 Takotsubo syndrome: Secondary | ICD-10-CM | POA: Diagnosis present

## 2021-12-04 DIAGNOSIS — R197 Diarrhea, unspecified: Secondary | ICD-10-CM | POA: Diagnosis not present

## 2021-12-04 DIAGNOSIS — K567 Ileus, unspecified: Secondary | ICD-10-CM | POA: Diagnosis not present

## 2021-12-04 DIAGNOSIS — F17210 Nicotine dependence, cigarettes, uncomplicated: Secondary | ICD-10-CM | POA: Diagnosis present

## 2021-12-04 DIAGNOSIS — I472 Ventricular tachycardia, unspecified: Secondary | ICD-10-CM | POA: Diagnosis present

## 2021-12-04 DIAGNOSIS — K255 Chronic or unspecified gastric ulcer with perforation: Secondary | ICD-10-CM | POA: Diagnosis present

## 2021-12-04 DIAGNOSIS — I319 Disease of pericardium, unspecified: Secondary | ICD-10-CM | POA: Diagnosis present

## 2021-12-04 DIAGNOSIS — I5021 Acute systolic (congestive) heart failure: Secondary | ICD-10-CM | POA: Diagnosis present

## 2021-12-04 DIAGNOSIS — I4729 Other ventricular tachycardia: Secondary | ICD-10-CM | POA: Diagnosis not present

## 2021-12-04 DIAGNOSIS — F419 Anxiety disorder, unspecified: Secondary | ICD-10-CM | POA: Diagnosis present

## 2021-12-04 DIAGNOSIS — M1990 Unspecified osteoarthritis, unspecified site: Secondary | ICD-10-CM | POA: Diagnosis present

## 2021-12-04 DIAGNOSIS — R1110 Vomiting, unspecified: Secondary | ICD-10-CM | POA: Diagnosis not present

## 2021-12-04 DIAGNOSIS — R5082 Postprocedural fever: Secondary | ICD-10-CM | POA: Diagnosis not present

## 2021-12-04 DIAGNOSIS — R188 Other ascites: Secondary | ICD-10-CM | POA: Diagnosis present

## 2021-12-04 DIAGNOSIS — E860 Dehydration: Secondary | ICD-10-CM | POA: Diagnosis present

## 2021-12-04 HISTORY — PX: LAPAROTOMY: SHX154

## 2021-12-04 LAB — ECHOCARDIOGRAM COMPLETE
Area-P 1/2: 10.25 cm2
Calc EF: 46.7 %
S' Lateral: 2.8 cm
Single Plane A2C EF: 52.5 %
Single Plane A4C EF: 36.3 %

## 2021-12-04 LAB — LACTIC ACID, PLASMA
Lactic Acid, Venous: 2.6 mmol/L (ref 0.5–1.9)
Lactic Acid, Venous: 1.3 mmol/L (ref 0.5–1.9)

## 2021-12-04 LAB — TROPONIN I (HIGH SENSITIVITY)
Troponin I (High Sensitivity): 34 ng/L — ABNORMAL HIGH (ref <18)
Troponin I (High Sensitivity): 181 ng/L (ref <18)
Troponin I (High Sensitivity): 5753 ng/L (ref <18)
Troponin I (High Sensitivity): 6579 ng/L (ref <18)
Troponin I (High Sensitivity): 7608 ng/L (ref <18)

## 2021-12-04 LAB — TYPE AND SCREEN
ABO/RH(D): O POS
Antibody Screen: NEGATIVE

## 2021-12-04 LAB — HIV ANTIBODY (ROUTINE TESTING W REFLEX): HIV Screen 4th Generation wRfx: NONREACTIVE

## 2021-12-04 SURGERY — LAPAROTOMY, EXPLORATORY
Anesthesia: General

## 2021-12-04 MED ORDER — HYDROMORPHONE 1 MG/ML IV SOLN
INTRAVENOUS | Status: DC
  Administered 2021-12-04: 1 mg via INTRAVENOUS
  Administered 2021-12-05: 2.1 mg via INTRAVENOUS
  Administered 2021-12-05 (×2): 2.7 mg via INTRAVENOUS
  Administered 2021-12-08: 1 mg via INTRAVENOUS
  Administered 2021-12-08: 2.1 mg via INTRAVENOUS
  Administered 2021-12-09 (×2): 1 mg via INTRAVENOUS
  Filled 2021-12-04 (×3): qty 30

## 2021-12-04 MED ORDER — DEXAMETHASONE SODIUM PHOSPHATE 10 MG/ML IJ SOLN
INTRAMUSCULAR | Status: AC
  Filled 2021-12-04: qty 1

## 2021-12-04 MED ORDER — FENTANYL CITRATE (PF) 250 MCG/5ML IJ SOLN
INTRAMUSCULAR | Status: DC | PRN
  Administered 2021-12-04: 100 ug via INTRAVENOUS

## 2021-12-04 MED ORDER — ALBUTEROL SULFATE HFA 108 (90 BASE) MCG/ACT IN AERS
2.0000 | INHALATION_SPRAY | Freq: Four times a day (QID) | RESPIRATORY_TRACT | Status: DC | PRN
  Filled 2021-12-04: qty 6.7

## 2021-12-04 MED ORDER — PANTOPRAZOLE 80MG IVPB - SIMPLE MED
80.0000 mg | Freq: Once | INTRAVENOUS | Status: AC
  Administered 2021-12-04: 80 mg via INTRAVENOUS
  Filled 2021-12-04: qty 80

## 2021-12-04 MED ORDER — LORAZEPAM 2 MG/ML IJ SOLN
1.0000 mg | Freq: Once | INTRAMUSCULAR | Status: AC
Start: 2021-12-04 — End: 2021-12-04
  Administered 2021-12-04: 1 mg via INTRAVENOUS
  Filled 2021-12-04: qty 1

## 2021-12-04 MED ORDER — DIPHENHYDRAMINE HCL 12.5 MG/5ML PO ELIX: 12.5000 mg | ORAL_SOLUTION | Freq: Four times a day (QID) | ORAL | Status: DC | PRN

## 2021-12-04 MED ORDER — STERILE WATER FOR IRRIGATION IR SOLN
Status: DC | PRN
  Administered 2021-12-04: 1000 mL

## 2021-12-04 MED ORDER — ENOXAPARIN SODIUM 40 MG/0.4ML IJ SOSY
40.0000 mg | PREFILLED_SYRINGE | INTRAMUSCULAR | Status: DC
  Administered 2021-12-05 – 2021-12-15 (×11): 40 mg via SUBCUTANEOUS
  Filled 2021-12-04 (×11): qty 0.4

## 2021-12-04 MED ORDER — PHENOL 1.4 % MT LIQD: 1.0000 | OROMUCOSAL | Status: DC | PRN

## 2021-12-04 MED ORDER — LIDOCAINE 2% (20 MG/ML) 5 ML SYRINGE
INTRAMUSCULAR | Status: DC | PRN
  Administered 2021-12-04: 100 mg via INTRAVENOUS

## 2021-12-04 MED ORDER — PIPERACILLIN-TAZOBACTAM 3.375 G IVPB
3.3750 g | Freq: Three times a day (TID) | INTRAVENOUS | Status: AC
  Administered 2021-12-04 – 2021-12-09 (×14): 3.375 g via INTRAVENOUS
  Filled 2021-12-04 (×16): qty 50

## 2021-12-04 MED ORDER — ACETAMINOPHEN 10 MG/ML IV SOLN
1000.0000 mg | Freq: Once | INTRAVENOUS | Status: DC | PRN
  Administered 2021-12-04: 1000 mg via INTRAVENOUS

## 2021-12-04 MED ORDER — PHENYLEPHRINE 80 MCG/ML (10ML) SYRINGE FOR IV PUSH (FOR BLOOD PRESSURE SUPPORT)
PREFILLED_SYRINGE | INTRAVENOUS | Status: DC | PRN
  Administered 2021-12-04: 80 ug via INTRAVENOUS
  Administered 2021-12-04: 120 ug via INTRAVENOUS
  Administered 2021-12-04: 160 ug via INTRAVENOUS

## 2021-12-04 MED ORDER — ROCURONIUM BROMIDE 10 MG/ML (PF) SYRINGE
PREFILLED_SYRINGE | INTRAVENOUS | Status: DC | PRN
  Administered 2021-12-04: 50 mg via INTRAVENOUS

## 2021-12-04 MED ORDER — HYDROMORPHONE HCL 1 MG/ML IJ SOLN: 0.2500 mg | INTRAMUSCULAR | Status: DC | PRN

## 2021-12-04 MED ORDER — MIDAZOLAM HCL 2 MG/2ML IJ SOLN
INTRAMUSCULAR | Status: AC
  Filled 2021-12-04: qty 2

## 2021-12-04 MED ORDER — IOHEXOL 350 MG/ML SOLN
100.0000 mL | Freq: Once | INTRAVENOUS | Status: AC | PRN
  Administered 2021-12-04: 100 mL via INTRAVENOUS

## 2021-12-04 MED ORDER — HYDROMORPHONE HCL 1 MG/ML IJ SOLN
0.5000 mg | Freq: Once | INTRAMUSCULAR | Status: AC
  Administered 2021-12-04: 0.5 mg via INTRAVENOUS
  Filled 2021-12-04: qty 1

## 2021-12-04 MED ORDER — ROCURONIUM BROMIDE 10 MG/ML (PF) SYRINGE
PREFILLED_SYRINGE | INTRAVENOUS | Status: AC
  Filled 2021-12-04: qty 10

## 2021-12-04 MED ORDER — ONDANSETRON HCL 4 MG/2ML IJ SOLN
INTRAMUSCULAR | Status: AC
  Filled 2021-12-04: qty 2

## 2021-12-04 MED ORDER — ONDANSETRON HCL 4 MG/2ML IJ SOLN
4.0000 mg | Freq: Four times a day (QID) | INTRAMUSCULAR | Status: DC | PRN
  Administered 2021-12-09 – 2021-12-10 (×5): 4 mg via INTRAVENOUS
  Filled 2021-12-04 (×5): qty 2

## 2021-12-04 MED ORDER — ORAL CARE MOUTH RINSE: 15.0000 mL | Freq: Once | OROMUCOSAL | Status: DC

## 2021-12-04 MED ORDER — ONDANSETRON 4 MG PO TBDP: 4.0000 mg | ORAL_TABLET | Freq: Four times a day (QID) | ORAL | Status: DC | PRN

## 2021-12-04 MED ORDER — ACETAMINOPHEN 10 MG/ML IV SOLN
INTRAVENOUS | Status: AC
  Filled 2021-12-04: qty 100

## 2021-12-04 MED ORDER — SUGAMMADEX SODIUM 200 MG/2ML IV SOLN
INTRAVENOUS | Status: DC | PRN
  Administered 2021-12-04: 200 mg via INTRAVENOUS

## 2021-12-04 MED ORDER — PHENYLEPHRINE HCL-NACL 20-0.9 MG/250ML-% IV SOLN
INTRAVENOUS | Status: DC | PRN
  Administered 2021-12-04: 25 ug/min via INTRAVENOUS

## 2021-12-04 MED ORDER — FENTANYL CITRATE PF 50 MCG/ML IJ SOSY
50.0000 ug | PREFILLED_SYRINGE | Freq: Once | INTRAMUSCULAR | Status: AC
  Administered 2021-12-04: 50 ug via INTRAVENOUS
  Filled 2021-12-04: qty 1

## 2021-12-04 MED ORDER — MORPHINE SULFATE (PF) 4 MG/ML IV SOLN
4.0000 mg | Freq: Once | INTRAVENOUS | Status: AC
  Administered 2021-12-04: 4 mg via INTRAVENOUS
  Filled 2021-12-04: qty 1

## 2021-12-04 MED ORDER — METOPROLOL TARTRATE 5 MG/5ML IV SOLN: 5.0000 mg | Freq: Four times a day (QID) | INTRAVENOUS | Status: DC | PRN

## 2021-12-04 MED ORDER — PROPOFOL 10 MG/ML IV BOLUS
INTRAVENOUS | Status: DC | PRN
  Administered 2021-12-04: 100 mg via INTRAVENOUS

## 2021-12-04 MED ORDER — OXYCODONE HCL 5 MG/5ML PO SOLN: 5.0000 mg | Freq: Once | ORAL | Status: DC | PRN

## 2021-12-04 MED ORDER — KETAMINE HCL-SODIUM CHLORIDE 100-0.9 MG/10ML-% IV SOSY
PREFILLED_SYRINGE | INTRAVENOUS | Status: DC | PRN
  Administered 2021-12-04 (×3): 10 mg via INTRAVENOUS

## 2021-12-04 MED ORDER — ONDANSETRON HCL 4 MG/2ML IJ SOLN
INTRAMUSCULAR | Status: DC | PRN
  Administered 2021-12-04: 4 mg via INTRAVENOUS

## 2021-12-04 MED ORDER — LACTATED RINGERS IV SOLN
INTRAVENOUS | Status: DC
Start: 2021-12-04 — End: 2021-12-04

## 2021-12-04 MED ORDER — PERFLUTREN LIPID MICROSPHERE
1.0000 mL | INTRAVENOUS | Status: AC | PRN
  Administered 2021-12-04: 2 mL via INTRAVENOUS
  Filled 2021-12-04: qty 10

## 2021-12-04 MED ORDER — ALBUMIN HUMAN 5 % IV SOLN: INTRAVENOUS | Status: DC | PRN

## 2021-12-04 MED ORDER — AMISULPRIDE (ANTIEMETIC) 5 MG/2ML IV SOLN: 10.0000 mg | Freq: Once | INTRAVENOUS | Status: DC | PRN

## 2021-12-04 MED ORDER — FENTANYL CITRATE (PF) 250 MCG/5ML IJ SOLN
INTRAMUSCULAR | Status: AC
  Filled 2021-12-04: qty 5

## 2021-12-04 MED ORDER — KETAMINE HCL 50 MG/5ML IJ SOSY
PREFILLED_SYRINGE | INTRAMUSCULAR | Status: AC
  Filled 2021-12-04: qty 5

## 2021-12-04 MED ORDER — ZOLPIDEM TARTRATE 5 MG PO TABS
5.0000 mg | ORAL_TABLET | Freq: Every evening | ORAL | Status: DC | PRN
  Administered 2021-12-04 – 2021-12-14 (×10): 5 mg via ORAL
  Filled 2021-12-04 (×10): qty 1

## 2021-12-04 MED ORDER — FENTANYL CITRATE PF 50 MCG/ML IJ SOSY: 12.5000 ug | PREFILLED_SYRINGE | INTRAMUSCULAR | Status: DC | PRN

## 2021-12-04 MED ORDER — ONDANSETRON HCL 4 MG/2ML IJ SOLN: 4.0000 mg | Freq: Four times a day (QID) | INTRAMUSCULAR | Status: DC | PRN

## 2021-12-04 MED ORDER — SODIUM CHLORIDE 0.9 % IV BOLUS
1000.0000 mL | Freq: Once | INTRAVENOUS | Status: AC
  Administered 2021-12-04: 1000 mL via INTRAVENOUS

## 2021-12-04 MED ORDER — SODIUM CHLORIDE 0.9% FLUSH: 9.0000 mL | INTRAVENOUS | Status: DC | PRN

## 2021-12-04 MED ORDER — NALOXONE HCL 0.4 MG/ML IJ SOLN: 0.4000 mg | INTRAMUSCULAR | Status: DC | PRN

## 2021-12-04 MED ORDER — 0.9 % SODIUM CHLORIDE (POUR BTL) OPTIME
TOPICAL | Status: DC | PRN
  Administered 2021-12-04 (×8): 1000 mL

## 2021-12-04 MED ORDER — OXYCODONE-ACETAMINOPHEN 5-325 MG PO TABS: 1.0000 | ORAL_TABLET | Freq: Once | ORAL | Status: DC

## 2021-12-04 MED ORDER — PIPERACILLIN-TAZOBACTAM 3.375 G IVPB: 3.3750 g | Freq: Three times a day (TID) | INTRAVENOUS | Status: DC

## 2021-12-04 MED ORDER — ONDANSETRON HCL 4 MG/2ML IJ SOLN
4.0000 mg | Freq: Once | INTRAMUSCULAR | Status: AC
  Administered 2021-12-04: 4 mg via INTRAVENOUS
  Filled 2021-12-04: qty 2

## 2021-12-04 MED ORDER — PIPERACILLIN-TAZOBACTAM 3.375 G IVPB 30 MIN
3.3750 g | Freq: Once | INTRAVENOUS | Status: AC
  Administered 2021-12-04: 3.375 g via INTRAVENOUS
  Filled 2021-12-04: qty 50

## 2021-12-04 MED ORDER — FENTANYL CITRATE (PF) 100 MCG/2ML IJ SOLN
INTRAMUSCULAR | Status: AC
  Administered 2021-12-04: 100 ug via INTRAVENOUS
  Filled 2021-12-04: qty 2

## 2021-12-04 MED ORDER — PANTOPRAZOLE SODIUM 40 MG IV SOLR
40.0000 mg | Freq: Two times a day (BID) | INTRAVENOUS | Status: DC
  Administered 2021-12-04 – 2021-12-09 (×11): 40 mg via INTRAVENOUS
  Filled 2021-12-04 (×11): qty 10

## 2021-12-04 MED ORDER — LACTATED RINGERS IV BOLUS: 1000.0000 mL | Freq: Once | INTRAVENOUS | Status: DC

## 2021-12-04 MED ORDER — CHLORHEXIDINE GLUCONATE 0.12 % MT SOLN
15.0000 mL | Freq: Once | OROMUCOSAL | Status: DC
  Filled 2021-12-04 (×2): qty 15

## 2021-12-04 MED ORDER — CHLORHEXIDINE GLUCONATE CLOTH 2 % EX PADS: 6.0000 | MEDICATED_PAD | Freq: Once | CUTANEOUS | Status: DC

## 2021-12-04 MED ORDER — SUCCINYLCHOLINE CHLORIDE 200 MG/10ML IV SOSY
PREFILLED_SYRINGE | INTRAVENOUS | Status: DC | PRN
  Administered 2021-12-04: 80 mg via INTRAVENOUS

## 2021-12-04 MED ORDER — ONDANSETRON HCL 4 MG/2ML IJ SOLN: 4.0000 mg | Freq: Once | INTRAMUSCULAR | Status: DC | PRN

## 2021-12-04 MED ORDER — CHLORHEXIDINE GLUCONATE 0.12 % MT SOLN: 15.0000 mL | Freq: Once | OROMUCOSAL | Status: DC

## 2021-12-04 MED ORDER — ALBUTEROL SULFATE (2.5 MG/3ML) 0.083% IN NEBU
2.5000 mg | INHALATION_SOLUTION | Freq: Four times a day (QID) | RESPIRATORY_TRACT | Status: DC | PRN
Start: 2021-12-04 — End: 2021-12-15
  Administered 2021-12-05 – 2021-12-11 (×8): 2.5 mg via RESPIRATORY_TRACT
  Filled 2021-12-04 (×8): qty 3

## 2021-12-04 MED ORDER — ESCITALOPRAM OXALATE 10 MG PO TABS
10.0000 mg | ORAL_TABLET | Freq: Every day | ORAL | Status: DC
Start: 2021-12-04 — End: 2021-12-04
  Filled 2021-12-04: qty 1

## 2021-12-04 MED ORDER — LIDOCAINE 2% (20 MG/ML) 5 ML SYRINGE
INTRAMUSCULAR | Status: AC
  Filled 2021-12-04: qty 5

## 2021-12-04 MED ORDER — LACTATED RINGERS IV BOLUS
1000.0000 mL | Freq: Once | INTRAVENOUS | Status: AC
  Administered 2021-12-04: 1000 mL via INTRAVENOUS

## 2021-12-04 MED ORDER — DICLOFENAC SODIUM 1 % EX GEL
2.0000 g | Freq: Four times a day (QID) | CUTANEOUS | Status: DC
Start: 2021-12-04 — End: 2021-12-09
  Administered 2021-12-04 – 2021-12-08 (×8): 2 g via TOPICAL
  Filled 2021-12-04: qty 100

## 2021-12-04 MED ORDER — PROPOFOL 10 MG/ML IV BOLUS
INTRAVENOUS | Status: AC
  Filled 2021-12-04: qty 20

## 2021-12-04 MED ORDER — NICOTINE 14 MG/24HR TD PT24
14.0000 mg | MEDICATED_PATCH | Freq: Every day | TRANSDERMAL | Status: DC
Start: 2021-12-04 — End: 2021-12-15
  Administered 2021-12-04 – 2021-12-15 (×12): 14 mg via TRANSDERMAL
  Filled 2021-12-04 (×12): qty 1

## 2021-12-04 MED ORDER — DEXTROSE-NACL 5-0.9 % IV SOLN: INTRAVENOUS | Status: DC

## 2021-12-04 MED ORDER — OXYCODONE HCL 5 MG PO TABS: 5.0000 mg | ORAL_TABLET | Freq: Once | ORAL | Status: DC | PRN

## 2021-12-04 MED ORDER — HYDROMORPHONE HCL 1 MG/ML IJ SOLN
1.0000 mg | Freq: Once | INTRAMUSCULAR | Status: AC
  Administered 2021-12-04: 1 mg via INTRAVENOUS
  Filled 2021-12-04: qty 1

## 2021-12-04 MED ORDER — DEXAMETHASONE SODIUM PHOSPHATE 10 MG/ML IJ SOLN
INTRAMUSCULAR | Status: DC | PRN
  Administered 2021-12-04: 4 mg via INTRAVENOUS

## 2021-12-04 MED ORDER — LACTATED RINGERS IV SOLN: INTRAVENOUS | Status: DC

## 2021-12-04 MED ORDER — FENTANYL CITRATE (PF) 100 MCG/2ML IJ SOLN: 50.0000 ug | Freq: Once | INTRAMUSCULAR | Status: AC

## 2021-12-04 MED ORDER — DIPHENHYDRAMINE HCL 50 MG/ML IJ SOLN: 12.5000 mg | Freq: Four times a day (QID) | INTRAMUSCULAR | Status: DC | PRN

## 2021-12-04 MED ORDER — MIDAZOLAM HCL 5 MG/5ML IJ SOLN
INTRAMUSCULAR | Status: DC | PRN
  Administered 2021-12-04 (×2): 1 mg via INTRAVENOUS

## 2021-12-04 MED ORDER — DEXMEDETOMIDINE (PRECEDEX) IN NS 20 MCG/5ML (4 MCG/ML) IV SYRINGE
PREFILLED_SYRINGE | INTRAVENOUS | Status: DC | PRN
  Administered 2021-12-04: 12 ug via INTRAVENOUS

## 2021-12-04 MED ORDER — ALUM & MAG HYDROXIDE-SIMETH 200-200-20 MG/5ML PO SUSP
30.0000 mL | Freq: Once | ORAL | Status: AC
  Administered 2021-12-04: 30 mL via ORAL
  Filled 2021-12-04: qty 30

## 2021-12-04 SURGICAL SUPPLY — 42 items
ABDOMINAL PAD ABD IMPLANT
BAG COUNTER SPONGE SURGICOUNT (BAG) ×1 IMPLANT
BIOPATCH RED 1 DISK 7.0 (GAUZE/BANDAGES/DRESSINGS) IMPLANT
BLADE CLIPPER SURG (BLADE) IMPLANT
BNDG GAUZE DERMACEA FLUFF 4 (GAUZE/BANDAGES/DRESSINGS) IMPLANT
CANISTER SUCT 3000ML PPV (MISCELLANEOUS) ×1 IMPLANT
CHLORAPREP W/TINT 26 (MISCELLANEOUS) ×1 IMPLANT
COVER SURGICAL LIGHT HANDLE (MISCELLANEOUS) ×1 IMPLANT
DRAIN CHANNEL 19F RND (DRAIN) IMPLANT
DRAPE LAPAROSCOPIC ABDOMINAL (DRAPES) ×1 IMPLANT
DRAPE WARM FLUID 44X44 (DRAPES) ×1 IMPLANT
DRSG TEGADERM 4X10 (GAUZE/BANDAGES/DRESSINGS) IMPLANT
ELECT CAUTERY BLADE 6.4 (BLADE) ×1 IMPLANT
ELECT REM PT RETURN 9FT ADLT (ELECTROSURGICAL) ×1
ELECTRODE REM PT RTRN 9FT ADLT (ELECTROSURGICAL) ×1 IMPLANT
EVACUATOR SILICONE 100CC (DRAIN) IMPLANT
GAUZE PAD ABD 7.5X8 STRL (GAUZE/BANDAGES/DRESSINGS) IMPLANT
GLOVE BIO SURGEON STRL SZ8 (GLOVE) ×1 IMPLANT
GLOVE BIOGEL PI IND STRL 8 (GLOVE) ×1 IMPLANT
GOWN STRL REUS W/ TWL LRG LVL3 (GOWN DISPOSABLE) ×1 IMPLANT
GOWN STRL REUS W/ TWL XL LVL3 (GOWN DISPOSABLE) ×1 IMPLANT
GOWN STRL REUS W/TWL LRG LVL3 (GOWN DISPOSABLE) ×1
GOWN STRL REUS W/TWL XL LVL3 (GOWN DISPOSABLE) ×1
HANDLE SUCTION POOLE (INSTRUMENTS) ×1 IMPLANT
KIT BASIN OR (CUSTOM PROCEDURE TRAY) ×1 IMPLANT
KIT TURNOVER KIT B (KITS) ×1 IMPLANT
NS IRRIG 1000ML POUR BTL (IV SOLUTION) ×2 IMPLANT
PACK GENERAL/GYN (CUSTOM PROCEDURE TRAY) ×1 IMPLANT
PAD ARMBOARD 7.5X6 YLW CONV (MISCELLANEOUS) ×1 IMPLANT
SPONGE T-LAP 18X18 ~~LOC~~+RFID (SPONGE) IMPLANT
STAPLER VISISTAT 35W (STAPLE) ×1 IMPLANT
SUCTION POOLE HANDLE (INSTRUMENTS) ×1
SUT ETHILON 2 0 FS 18 (SUTURE) IMPLANT
SUT PDS AB 1 TP1 96 (SUTURE) ×2 IMPLANT
SUT VIC AB 2-0 SH 18 (SUTURE) ×1 IMPLANT
SUT VIC AB 3-0 SH 18 (SUTURE) ×1 IMPLANT
SUT VICRYL AB 2 0 TIES (SUTURE) ×1 IMPLANT
SUT VICRYL AB 3 0 TIES (SUTURE) ×1 IMPLANT
TOWEL GREEN STERILE (TOWEL DISPOSABLE) ×1 IMPLANT
TOWEL GREEN STERILE FF (TOWEL DISPOSABLE) ×1 IMPLANT
TRAY FOLEY MTR SLVR 16FR STAT (SET/KITS/TRAYS/PACK) IMPLANT
YANKAUER SUCT BULB TIP NO VENT (SUCTIONS) IMPLANT

## 2021-12-04 NOTE — Progress Notes (Signed)
  Echocardiogram 2D Echocardiogram has been performed.  Kimberly Terrell 12/04/2021, 8:40 AM

## 2021-12-04 NOTE — ED Notes (Signed)
Report given to Gaye Alken, RN of Short Stay [facility]

## 2021-12-04 NOTE — H&P (Signed)
Date: 12/04/2021               Patient Name:  Kimberly Terrell. Kimberly Terrell MRN: [id number]  DOB: 10/07/1975 Age / Sex: 46 y.o., female   PCP: [facility]         Medical Service: Internal Medicine Teaching Service         Attending Physician: Dr. Inez Catalina, MD    First Contact: Dr. Karoline Caldwell, MD  Pager: [phone]  Second Contact: Dr. Quincy Simmonds, MD  Pager: [phone]       After Hours (After 5p/  First Contact Pager: [phone]  weekends / holidays): Second Contact Pager: [phone]   Chief Complaint: worsening abdominal pain   History of Present Illness:   [name] is 46 yo with a PMH of anxiety, arthritis, asthma (well controlled), h/o H pylori and PUD who presents with abdominal pain.    History provided by patient and her spouse who is at bedside, as it is sometimes painful for patient to speak.    Patient reports that about a week prior to admission she was not feeling well. She was having symptoms of URI after being around their granddaughter who had similar symptoms. Patient reports that she was coughing a lot over the weekend. Her coughing became so severe she began having post tussive emesis with frothy, gastric contents (yellow/greenish appearing) with red color (though she notes she was taking robitussin to help with her cough). For the past four days she was having 15 to 20 episodes of emesis and could not keep any fluids or liquids down. She had no associated fever or chills, or constipation. She does have diarrhea that has been ongoing since 4-5 months ago after she started over the counter magnesium to help with her arthritic pain.   However, starting about 2 days prior to admission she began having abdominal pain that she felt was due to her frequent coughing spells and emesis. The pain was primarily in her epigastrium.   She presented to urgent care the day before admission for her abdominal pain, and was given IV fluids and antiemetics.   She  states that her abdominal pain continued to worsen and she told her husband that she needed to present to the hospital.   Of note, patient was diagnosed with PUD per her report. The note from her EGD 08/2019 is not available in care everywhere. Her pathology was notable for no H pylori, however, she was noted to have reactive gastropathy with prominent eosinophilic infiltrates on biopsy of the stomach. And per subsequent PCP clinic notes, this study was normal.   Patient also reports that she continues to smoke, about 1ppd for the last 32 years. She also takes 10d courses of aleve for her multiple joint pains (about 5 courses over the last 3 months.)  She also has been receiving steroid injections at her orthopedic's office for R foot pain and R trochanteric bursitis (6/28 and 3/23 respectively).   Patient does state that while in the waiting room in the ED she developed some chest discomfort that resolved by the time she was examined in the ED.   Meds:  Asthma albuterol inhaler used about four times over the last 3 months   Lexapro  Trazodone  Unsure if taking trileptal. But this was started 02/2017 for her depression  Metoprolol for sinus tachycardia, has been on this since 2021 for palpitations. Recently saw Dr. 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   albuterol (VENTOLIN HFA) 108 (90 Base) MCG/ACT inhaler Inhale 2 puffs into the lungs every 6 (six) hours as needed for wheezing or shortness of breath.   diclofenac sodium (VOLTAREN) 1 % GEL Apply 2 g topically 4 (four)  times daily. (Patient taking differently: Apply 2 g topically 4 (four) times daily as needed (pain).)   escitalopram (LEXAPRO) 10 MG tablet Take 10 mg by mouth daily.   Melatonin 10 MG CAPS Take 20 mg by mouth at bedtime.   metoprolol succinate (TOPROL-XL) 25 MG 24 hr tablet Take 1 tablet (25 mg total) by mouth daily. Please make overdue appt with Dr. Johney Frame before anymore refills. Thank you 2nd attempt   Misc Natural Products (GLUCOSAMINE CHOND CMP ADVANCED PO) Take 2 capsules by mouth daily.   Oxcarbazepine (TRILEPTAL) 300 MG tablet TAKE 1 TABLET BY MOUTH AT BEDTIME (Patient taking differently: Take 300 mg by mouth at bedtime.)   traZODone (DESYREL) 50 MG tablet Take 50 mg by mouth at bedtime.     Allergies: Allergies as of 12/03/2021 - Review Complete 12/03/2021  Allergen Reaction Noted   Tape Other (See Comments) 07/04/2019   Aspirin Other (See Comments) 07/04/2019   Imitrex [sumatriptan] Other (See Comments) 07/04/2019   Past Medical History:  Diagnosis Date   Anxiety    Arthritis    Asthma    Bursitis    Right Hip and Right Knee   Depression    H. pylori infection    s/p triple therapy x 2   Migraines     Family History:  Family History  Problem Relation Age of Onset   Depression Mother    Depression Daughter    Colon cancer Neg Hx    Esophageal cancer Neg Hx    Gastric cancer Neg Hx     Social History:  Social History   Tobacco Use   Smoking status: Every Day    Packs/day: 1.00    Types: Cigarettes   Smokeless tobacco: Never   Tobacco comments:    Planning to quit 03/30/2019  Vaping Use   Vaping Use: Never used  Substance Use Topics   Alcohol use: No   Drug use: No     Review of Systems: Patient also noted R shoulder, R hip, R wrist, R knee, L shoulder occasionally, L wrist. This has been ongoing for several years she does not think the pain is worse in the mornings or at the end of the day. She notes pain in her joints is sometimes provoked by heavy  lifting (she takes care of her grandchildren during the day). She has never seen a rheumatologist for this.   Physical Exam: Blood pressure 122/71, pulse (!) 130, temperature 97.8 F (36.6 C), resp. rate (!) 22, height 5\' 3"  (1.6 m), weight 63.5 kg, SpO2 97 %.  Constitutional: Well-developed, well-nourished, uncomfortable  HENT:  Head: Normocephalic and atraumatic.  Eyes: EOM are normal.  Neck: Normal range of motion.  Cardiovascular: Tachycardic rate, regular rhythm, intact distal pulses. No gallop and  no friction rub.  No murmur heard. No lower extremity edema  Pulmonary: Non labored breathing on room air, no wheezing or rales  Abdominal: Soft. Distended. Diffusely TTP. Voluntary guarding.  Musculoskeletal: Normal range of motion.        General: No tenderness or edema.  Neurological: Alert and oriented to person, place, and time. Non focal  Skin: Skin is warm and dry.    EKG: personally reviewed my interpretation is sinus tachycardia, ST elevations II, III, aVf, V3-6, st depressions avl, avr, V2  CXR: personally reviewed my interpretation is no acute cardiopulmonary process.   CT C/A/P:  IMPRESSION: 1. No evidence for thoracic or abdominal aortic aneurysm or dissection. 2. Circumferential wall thickening in the mid esophagus suggests esophagitis. 3. Focal areas of tree-in-bud nodularity in both lungs suggest sequelae of atypical infection, including MAI. 4. Marked thickening and edema of the gastric wall with tiny extraluminal gas bubbles adjacent to the proximal stomach. While no definite or discrete ulcer can be identified, there are areas of gastric wall thinning anteriorly in the body of the stomach and posteriorly towards the antral region. Given the history of H pylori, perforated gastric ulcer would be a consideration for the intraperitoneal free air. 5. Moderate to large volume ascites with fluid around the liver and spleen. Average attenuation the fluid is  relatively low, not favoring hemorrhage. 6. Possible areas of segmental hypoperfusion in the left kidney, raising the possibility of pyelonephritis. 7. There is a question of a very subtle nodule along the peritoneum of the right pelvis. After resolution of patient's acute symptoms, follow-up standard abdomen and pelvis CT with oral and intravenous contrast recommended to further evaluate. 8. 4 mm left upper lobe pulmonary nodule. No follow-up needed if patient is low-risk.This recommendation follows the consensus statement: Guidelines for Management of Incidental Pulmonary Nodules Detected on CT Images: From the Fleischner Society 2017; Radiology 2017; 284:228-243. 9. Emphysema (ICD10-J43.9).   Assessment & Plan by Problem: Principal Problem:   Perforated viscus Ms. Naomi Castrogiovanni is 46 yo with a PMH of anxiety, arthritis, asthma (well controlled), h/o H pylori and PUD who presents with acutely worsened abdominal pain found perforated gastric ulcer likely c/b pericarditis.   #Perforated viscus  Patient has a history of PUD per her report and H Pylori infection which was purportedly adequately treated. She has had persistent abdominal pain intermittently over the last couple of years that is mostly provoked by ingesting spicy or acidic foods and carbonated beverages. However her pain became acutely worsened over the last week. She has risk factors for PUD including her cigarette use and her NSAID use.   She is currently afebrile and HDS. However, her imaging did show evidence of perforated gastric wall including free air. Patient also noted to have large volume ascites (likely reactive to her perforation).   She received IV fluids and zosyn while in the ED and general surgery was consulted for urgent take back to the OR.  -IV protonix  -Continue IV zosyn  -Fentany or HM IV for pain control  -F/u Gen surgery recommendations   #Chest pain #Mildly elevated troponins and diffuse ST  elevations  Likely secondary to her gastric perforation causing inflammation of pericardial sac. She was evaluated by cardiology and is currently symptom free. Will continue to trend troponins and follow up on TTE.   #TUD  Consult TOC, nicotine patch   #Arthritis  Voltaren gel while inpatient. Can be seen outpatient by rheumatology. Discuss with patient to avoid all NSAIDs  #?  Asthma v Emphysema  Patient is a long time smoker. But reports she was diagnosed with asthma. No PFTs. She has albuterol inhaler which she uses sparingly.  -Continue albuterol  -Recommend PFTs once out of the hospital  Dispo: Admit patient to Inpatient with expected length of stay greater than 2 midnights.  Signed: Marolyn Haller, MD 12/04/2021, 11:24 AM  After 5pm on weekdays and 1pm on weekends: On Call pager: [phone]

## 2021-12-04 NOTE — ED Provider Notes (Signed)
Patient presents with perforated gastric ulcer.  General surgery to evaluate in the ED.  All treatment has been initiated with antibiotics and fluid resuscitation. Physical Exam  BP 105/73   Pulse (!) 129   Temp (!) 97.4 F (36.3 C) (Oral)   Resp (!) 22   SpO2 96%   Physical Exam  Procedures  Procedures  ED Course / MDM    Medical Decision Making Amount and/or Complexity of Data Reviewed Labs: ordered. Radiology: ordered. ECG/medicine tests: ordered.  Risk OTC drugs. Prescription drug management. Decision regarding hospitalization.   General surgery has evaluated the patient.  They plan to take her to the OR for treatment from the emergency department.  Request medical admission for comorbid medical conditions.  Consult: Reviewed with internal medicine teaching service for admission.       Arby Barrette, MD 12/04/21 [phone]

## 2021-12-04 NOTE — Progress Notes (Signed)
Was called by the medicine team because of significantly elevated troponin >5K  She was seen this morning by cardiology prior to her emergent surgery for gastric perforation. At that time she had mildly elevated troponin with ST changes deemed to be likely pericarditis in setting of perforation of stomach.   We repeated an EKG- EKG looks similar and consistent with diffuse pericarditis instead of ischemic etiology of ST elevations. Troponin elevation most likely secondary to emergent surgery + perforation. Moreover, with recent abdominal surgery this afternoon only, taking to cath lab does not seem very appropriate. We will continue to monitor for now closely.

## 2021-12-04 NOTE — Anesthesia Preprocedure Evaluation (Signed)
Anesthesia Evaluation  Patient identified by MRN, date of birth, ID band Patient awake    Reviewed: Allergy & Precautions, NPO status , Patient's Chart, lab work & pertinent test results  Airway Mallampati: II  TM Distance: >3 FB Neck ROM: Full    Dental no notable dental hx. (+) Teeth Intact, Dental Advisory Given   Pulmonary asthma , Current Smoker and Patient abstained from smoking.,    Pulmonary exam normal breath sounds clear to auscultation       Cardiovascular Normal cardiovascular exam Rhythm:Regular Rate:Normal     Neuro/Psych PSYCHIATRIC DISORDERS Anxiety Depression    GI/Hepatic PUD, GERD  ,  Endo/Other    Renal/GU Lab Results      Component                Value               Date                      CREATININE               0.78                12/03/2021                BUN                      12                  12/03/2021                NA                       138                 12/03/2021                K                        3.4 (L)             12/03/2021                CL                       104                 12/03/2021                CO2                      19 (L)              12/03/2021                Musculoskeletal   Abdominal   Peds  Hematology Lab Results      Component                Value               Date                      WBC                      16.1 (H)  12/03/2021                HGB                      12.7                12/03/2021                HCT                      40.4                12/03/2021                MCV                      85.2                12/03/2021                PLT                      567 (H)             12/03/2021              Anesthesia Other Findings   Reproductive/Obstetrics negative OB ROS                            Anesthesia Physical Anesthesia Plan  ASA: 2 and emergent  Anesthesia Plan: General    Post-op Pain Management: Precedex, Ofirmev IV (intra-op)* and Ketamine IV*   Induction: Intravenous  PONV Risk Score and Plan: 3 and Treatment may vary due to age or medical condition, Ondansetron and Midazolam  Airway Management Planned: Oral ETT  Additional Equipment: None  Intra-op Plan:   Post-operative Plan: Extubation in OR  Informed Consent: I have reviewed the patients History and Physical, chart, labs and discussed the procedure including the risks, benefits and alternatives for the proposed anesthesia with the patient or authorized representative who has indicated his/her understanding and acceptance.     Dental advisory given  Plan Discussed with:   Anesthesia Plan Comments:        Anesthesia Quick Evaluation

## 2021-12-04 NOTE — Progress Notes (Signed)
Spoke with lab tech; request to draw post op troponin and lactic

## 2021-12-04 NOTE — Progress Notes (Signed)
   12/04/21 1357  Assess: MEWS Score  Temp 98.3 F (36.8 C)  BP 104/76  MAP (mmHg) 86  Pulse Rate (!) 117  Resp 18  SpO2 95 %  O2 Device Nasal Cannula  O2 Flow Rate (L/min) 3 L/min  Assess: MEWS Score  MEWS Temp 0  MEWS Systolic 0  MEWS Pulse 2  MEWS RR 0  MEWS LOC 0  MEWS Score 2  MEWS Score Color Yellow  Assess: if the MEWS score is Yellow or Red  Were vital signs taken at a resting state? Yes  Focused Assessment No change from prior assessment  Does the patient meet 2 or more of the SIRS criteria? Yes  Does the patient have a confirmed or suspected source of infection? No  MEWS guidelines implemented *See Row Information* Yes  Take Vital Signs  Increase Vital Sign Frequency  Yellow: Q 2hr X 2 then Q 4hr X 2, if remains yellow, continue Q 4hrs  Escalate  MEWS: Escalate Yellow: discuss with charge nurse/RN and consider discussing with provider and RRT  Notify: Charge Nurse/RN  Name of Charge Nurse/RN Notified Windell Moulding, RN  Date Charge Nurse/RN Notified 12/04/21  Time Charge Nurse/RN Notified 1400  Document  Patient Outcome Not stable and remains on department  Progress note created (see row info) Yes  Assess: SIRS CRITERIA  SIRS Temperature  0  SIRS Pulse 1  SIRS Respirations  0  SIRS WBC 1  SIRS Score Sum  2

## 2021-12-04 NOTE — Interval H&P Note (Signed)
History and Physical Interval Note:  12/04/2021 10:46 AM  Kimberly Terrell  has presented today for surgery, with the diagnosis of Acute Gastric Perforation.  The various methods of treatment have been discussed with the patient and family. After consideration of risks, benefits and other options for treatment, the patient has consented to  Procedure(s): EXPLORATORY LAPAROTOMY (N/A) as a surgical intervention.  The patient's history has been reviewed, patient examined, no change in status, stable for surgery.  I have reviewed the patient's chart and labs.  Questions were answered to the patient's satisfaction.   The procedure has been discussed with the patient.  Alternative therapies have been discussed with the patient.  Operative risks include bleeding,  Infection,  Organ injury,  Nerve injury,  Blood vessel injury,  DVT,  Pulmonary embolism,  Death,  And possible reoperation.  Medical management risks include worsening of present situation.  The success of the procedure is 50 -90 % at treating patients symptoms.  The patient understands and agrees to proceed.   Dortha Schwalbe MD

## 2021-12-04 NOTE — ED Notes (Signed)
Pt pulled back for second troponin; denies improvement in pain with GI cocktail. Reports intermittent nausea. Verbal orders obtained for medication, zofran and morphine given.

## 2021-12-04 NOTE — Progress Notes (Signed)
Subjective:  HR a bit better no chest pain severe abdominal pain still  Objective:  Vitals:   12/04/21 0515 12/04/21 0530 12/04/21 0545 12/04/21 0630  BP: (!) 146/83 (!) 132/92 132/86 110/88  Pulse: (!) 131 (!) 126 (!) 133 (!) 141  Resp: (!) 27 (!) 27 (!) 25 (!) 23  Temp:      TempSrc:      SpO2: 96% 96% 95% 96%    Intake/Output from previous day:  Intake/Output Summary (Last 24 hours) at 12/04/2021 0745 Last data filed at 12/03/2021 2154 Gross per 24 hour  Intake 1000 ml  Output --  Net 1000 ml    Physical Exam: Emphysema no wheezing No rub / murmur Distended abdomen rebound and ascites Trace edema Lungs clear  Lab Results: Basic Metabolic Panel: Recent Labs    12/03/21 2200  NA 138  K 3.4*  CL 104  CO2 19*  GLUCOSE 122*  BUN 12  CREATININE 0.78  CALCIUM 9.7   Liver Function Tests: Recent Labs    12/03/21 2200  AST 21  ALT 18  ALKPHOS 93  BILITOT 1.1  PROT 6.2*  ALBUMIN 3.3*   Recent Labs    12/03/21 2200  LIPASE 22   CBC: Recent Labs    12/03/21 2200  WBC 16.1*  NEUTROABS 13.5*  HGB 12.7  HCT 40.4  MCV 85.2  PLT 567*   Cardiac Enzymes: No results for input(s): "CKTOTAL", "CKMB", "CKMBINDEX", "TROPONINI" in the last 72 hours. BNP: Invalid input(s): "POCBNP" D-Dimer: No results for input(s): "DDIMER" in the last 72 hours. Hemoglobin A1C: No results for input(s): "HGBA1C" in the last 72 hours. Fasting Lipid Panel: No results for input(s): "CHOL", "HDL", "LDLCALC", "TRIG", "CHOLHDL", "LDLDIRECT" in the last 72 hours. Thyroid Function Tests: No results for input(s): "TSH", "T4TOTAL", "T3FREE", "THYROIDAB" in the last 72 hours.  Invalid input(s): "FREET3" Anemia Panel: No results for input(s): "VITAMINB12", "FOLATE", "FERRITIN", "TIBC", "IRON", "RETICCTPCT" in the last 72 hours.  Imaging: CT Angio Chest/Abd/Pel for Dissection W and/or Wo Contrast  Result Date: 12/04/2021 CLINICAL DATA:  Diffuse abdominal pain. Vomiting. History  of H pylori. EXAM: CT ANGIOGRAPHY CHEST, ABDOMEN AND PELVIS TECHNIQUE: Non-contrast CT of the chest was initially obtained. Multidetector CT imaging through the chest, abdomen and pelvis was performed using the standard protocol during bolus administration of intravenous contrast. Multiplanar reconstructed images and MIPs were obtained and reviewed to evaluate the vascular anatomy. RADIATION DOSE REDUCTION: This exam was performed according to the departmental dose-optimization program which includes automated exposure control, adjustment of the mA and/or kV according to patient size and/or use of iterative reconstruction technique. CONTRAST:  OMNIPAQUE IOHEXOL 350 MG/ML SOLN COMPARISON:  Abdomen and pelvis CT 07/04/2019 FINDINGS: CTA CHEST FINDINGS Cardiovascular: Pre contrast imaging shows no hyperdense crescent in the wall of the thoracic aorta to suggest the presence of an acute intramural hematoma. Imaging after IV contrast administration shows no thoracic aortic aneurysm. Ascending thoracic aorta measures 2.9 cm diameter. Mid descending thoracic aorta measures 1.9 cm diameter. No evidence for thoracic aortic dissection on this non gated study. Arterial arch vessel anatomy is widely patent. The heart size is normal. No substantial pericardial effusion. Mediastinum/Nodes: 9 mm short axis subcarinal lymph node on 76/6 is upper normal for size. Mild circumferential wall thickening noted mid esophagus (image 80/6) There is no hilar lymphadenopathy. There is no axillary lymphadenopathy. Lungs/Pleura: Mild centrilobular emphysema noted secretions are noted in the right mainstem bronchus and bronchus intermedius. Subtle tree-in-bud nodularity noted  anterior right lower lobe on image 90/8. 4 mm left upper lobe nodule identified on 47/8. Small focus of tree-in-bud nodularity noted posterior left lower lobe on 91/8. Mild circumferential bronchial wall thickening noted both lower lobes with scattered areas of  peripheral small airway impaction bilaterally. Musculoskeletal: No worrisome lytic or sclerotic osseous abnormality. Review of the MIP images confirms the above findings. CTA ABDOMEN AND PELVIS FINDINGS VASCULAR Aorta: Normal caliber aorta without aneurysm, dissection, vasculitis or significant stenosis. Celiac: Patent without evidence of aneurysm, dissection, vasculitis or significant stenosis. SMA: Patent without evidence of aneurysm, dissection, vasculitis or significant stenosis. Renals: Both renal arteries are patent without evidence of aneurysm, dissection, vasculitis, fibromuscular dysplasia or significant stenosis. IMA: Patent without evidence of aneurysm, dissection, vasculitis or significant stenosis. Inflow: Patent without evidence of aneurysm, dissection, vasculitis or significant stenosis. Veins: No obvious venous abnormality within the limitations of this arterial phase study. Review of the MIP images confirms the above findings. NON-VASCULAR Hepatobiliary: No suspicious focal abnormality within the liver parenchyma. There is no evidence for gallstones, gallbladder wall thickening, or pericholecystic fluid. No intrahepatic or extrahepatic biliary dilation. Pancreas: No focal mass lesion. No dilatation of the main duct. No intraparenchymal cyst. No peripancreatic edema. Spleen: No splenomegaly. No focal mass lesion. Adrenals/Urinary Tract: No adrenal nodule or mass. Right kidney unremarkable. Possible areas of segmental hypoperfusion in the left kidney including anterior upper pole on 151/6, posterior interpolar region on 160/6, and lower pole on 179/6. No evidence for hydroureter. The urinary bladder appears normal for the degree of distention. Stomach/Bowel: Stomach is diffusely thick walled and appears edematous. Extraluminal gas is identified anterior to the proximal stomach on image 153/6. Tiny extraluminal gas bubbles are seen adjacent to the proximal stomach on 142/6. Focal areas of gastric wall  thinning are seen anteriorly in the body of the stomach and posteriorly towards the antral region, but no definite or discrete ulcer can be identified. Duodenal wall is ill-defined. No small bowel wall thickening. No small bowel dilatation. The terminal ileum is normal. The appendix is not well visualized, but there is no edema or inflammation in the region of the cecum. Colon is diffusely decompressed. Lymphatic: Scattered small lymph nodes are seen in the hepatoduodenal in gastrohepatic ligament. No retroperitoneal lymphadenopathy. Soft tissue attenuation anterior to the aorta on image 181/6 appears to represent small bowel on coronal imaging and is similar to the 2021 exam. No pelvic sidewall lymphadenopathy. Reproductive: The uterus is unremarkable.  There is no adnexal mass. Other: Moderate to large volume ascites evident with fluid around the liver and spleen. Average attenuation the fluid is relatively low, making hemorrhage. Edema/fluid is seen in the gastrocolic ligament with fluid in both pericolic gutters and in the pelvis. No substantial peritoneal thickening or hyperenhancement. There is a question of a very subtle nodule along the peritoneum of the right pelvis (see image 277/6). Musculoskeletal: No worrisome lytic or sclerotic osseous abnormality. Review of the MIP images confirms the above findings. IMPRESSION: 1. No evidence for thoracic or abdominal aortic aneurysm or dissection. 2. Circumferential wall thickening in the mid esophagus suggests esophagitis. 3. Focal areas of tree-in-bud nodularity in both lungs suggest sequelae of atypical infection, including MAI. 4. Marked thickening and edema of the gastric wall with tiny extraluminal gas bubbles adjacent to the proximal stomach. While no definite or discrete ulcer can be identified, there are areas of gastric wall thinning anteriorly in the body of the stomach and posteriorly towards the antral region. Given the history of H  pylori, perforated  gastric ulcer would be a consideration for the intraperitoneal free air. 5. Moderate to large volume ascites with fluid around the liver and spleen. Average attenuation the fluid is relatively low, not favoring hemorrhage. 6. Possible areas of segmental hypoperfusion in the left kidney, raising the possibility of pyelonephritis. 7. There is a question of a very subtle nodule along the peritoneum of the right pelvis. After resolution of patient's acute symptoms, follow-up standard abdomen and pelvis CT with oral and intravenous contrast recommended to further evaluate. 8. 4 mm left upper lobe pulmonary nodule. No follow-up needed if patient is low-risk.This recommendation follows the consensus statement: Guidelines for Management of Incidental Pulmonary Nodules Detected on CT Images: From the Fleischner Society 2017; Radiology 2017; 284:228-243. 9. Emphysema (ICD10-J43.9). Critical Value/emergent results were called by telephone at the time of interpretation on 12/04/2021 at 5:52 am to provider [facility] , who verbally acknowledged these results. Electronically Signed   By: Kennith Center M.D.   On: 12/04/2021 05:53   DG Chest 2 View  Result Date: 12/04/2021 CLINICAL DATA:  Chest pain EXAM: CHEST - 2 VIEW COMPARISON:  12/26/2019 FINDINGS: Lungs are clear.  No pleural effusion or pneumothorax. The heart is normal in size. Visualized osseous structures are within normal limits. IMPRESSION: Normal chest radiographs. Electronically Signed   By: Charline Bills M.D.   On: 12/04/2021 00:55    Cardiac Studies:  ECG: ST possible pericarditis    Telemetry: ST  Echo: pending  Medications:     HYDROmorphone (DILAUDID) injection  0.5 mg Intravenous Once      lactated ringers     pantoprazole (PROTONIX) IV      Assessment/Plan:   Elevated troponin :  with ST likely pericarditis in setting of perforation of stomach Getting stat echo now Clear to have emergent surgery for perforation of viscus. On iv protonix  needs antibiotics started elevated WBC  Smoking emphysema no active wheezing   Charlton Haws 12/04/2021, 7:45 AM

## 2021-12-04 NOTE — Op Note (Signed)
Preoperative diagnosis: Peritonitis with suspected perforated peptic ulcer disease  Postoperative diagnosis: Perforated distal body posterior wall stomach ulcer with 1.5 L of gastric contents abdominal cavity and peritonitis  Procedure: Exploratory laparotomy with closure of posterior gastric wall ulcer and biopsy of ulcer  Surgeon: Erroll Luna, MD  Anesthesia: General   EBL: 20 cc  Drains: 19 round  Specimen: Gastric ulcer biopsy from posterior distal body gastric ulcer perforation  IV fluids: Per anesthesia record  Indications for procedure: The patient is a 46 year old female with a history of H. pylori infections and tobacco abuse who presents with peritonitis emergency room this morning.  CT scan showed what appeared to be a perforated gastric or duodenal ulcer.  She had significant ascites and significant severe peritonitis.  Emergent exploration was recommended to the patient and she agreed to proceed.  We discussed the pros and cons of surgery as well as potential findings and the need for either resectional therapy or the ability to patch the ulcer depending on findings.  We also explained the need for further surgery and other treatments depending on findings.  She understood the above and agreed to proceed.  ACS protocol risk calculator is in the chart was discussed with the patient.CASE DATA:  Type of patient?: DOW CASE (Surgical Hospitalist [facility] Inpatient)  Status of Case? EMERGENT Add On  Infection Present At Time Of Surgery (PATOS)?  FECULENT PERITONITIS   The procedure has been discussed with the patient.  Alternative therapies have been discussed with the patient.  Operative risks include bleeding,  Infection,  Organ injury,  Nerve injury,  Blood vessel injury,  DVT,  Pulmonary embolism,  Death,  And possible reoperation.  Medical management risks include worsening of present situation.  The success of the procedure is 50 -90 % at treating patients symptoms.  The patient  understands and agrees to proceed.   Description of procedure: The patient was met in the holding area.  She is examined chart reviewed and procedure reviewed with her and her family.  Risk were understood as mentioned above and she agreed to proceed.  She was placed upon the OR table.  After induction of general esthesia, Foley catheter was placed and orogastric was placed.  The abdomen was then prepped and draped in sterile fashion and she received antibiotics in the emergency room.  Timeout performed.  Upper midline incision was used from xiphoid to the umbilicus.  Dissection was carried down to the midline was identified.  The midline was opened and the abdominal cavity entered.  There is copious amounts of foul-smelling fluid consistent with gastric perforation.  1.5 L of gastric contents were suctioned out of her abdominal cavity.  This had a feculent smell to it as well.  This appeared to be old undigested food and gastric contents.  We then placed a retractor.  I then put the stomach.  There is significant fluid draining from the posterior wall.  I open the omentum and went into the lesser sac.  Identified a 1 cm hole in the posterior stomach wall at the distal body just proximal to the pyloric channel.  This measured 1 cm.  I took a biopsy of this area and sent separately for evaluation of this appeared to be significant for peptic ulcer disease and less likely a malignant mass perforation since I could not feel any mass of the stomach there.  This was small enough to patch.  A small tongue of omentum was fashioned.  We then placed interrupted  2-0 Vicryl's to close the gastric perforation and closed this.  Omental tongue was placed on this and set tied down with the sutures in place to create a patch of the ulcer posteriorly.  NG tube was positioned proximal LAD and.  8 L of irrigation were used to suction out all contents of the abdominal cavity until clear.  Of note there is no evidence of any colonic  perforation upon inspection of the abdominal cavity either ascending, transverse, descending or sigmoid or rectum.  Small bowel appeared grossly normal except for signs of peritonitis upon inspection.  Liver and gallbladder and were normal and the remainder the stomach appeared normal.  Once irrigation was suctioned and clear I saw no signs of any further enteric contamination or bleeding.  We then closed the fascia with double-stranded PDS #1.  Skin packed open.  Of note a JP drain was placed prior to closure which is a 19 round into the operative bed.  This was secured with 2-0 nylon.  Dry dressing was applied.  The patient was then awoke extubated taken recovery in stable condition.  All counts found to be correct.

## 2021-12-04 NOTE — ED Notes (Signed)
The patient asked this EMT for oral swabs. This EMT will take the patient's temperature after the patient has swabbed her mouth sufficiently.

## 2021-12-04 NOTE — Transfer of Care (Signed)
Immediate Anesthesia Transfer of Care Note  Patient: Kimberly Terrell. Kuch  Procedure(s) Performed: EXPLORATORY LAPAROTOMY  Patient Location: PACU  Anesthesia Type:General  Level of Consciousness: drowsy and patient cooperative  Airway & Oxygen Therapy: Patient Spontanous Breathing and Patient connected to face mask oxygen  Post-op Assessment: Report given to RN and Post -op Vital signs reviewed and stable  Post vital signs: Reviewed and stable  Last Vitals:  Vitals Value Taken Time  BP 118/72 12/04/21 1240  Temp    Pulse 118 12/04/21 1243  Resp 25 12/04/21 1243  SpO2 91 % 12/04/21 1243  Vitals shown include unvalidated device data.  Last Pain:  Vitals:   12/04/21 1052  TempSrc:   PainSc: 10-Worst pain ever         Complications: No notable events documented.

## 2021-12-04 NOTE — Consult Note (Addendum)
Cardiology Consultation:   Patient ID: Kimberly Terrell. [place] MRN: [id number]; DOB: 1975-07-02  Admit date: 12/03/2021 Date of Consult: 12/04/2021  Primary Care Provider: [facility] Primary Cardiologist: None  Primary Electrophysiologist:  None    Patient Profile:   Kimberly Maxim. Terrell is a 46 y.o. female with a hx of anxiety, depression, sinus tachycardia, and H. pylori treated x2 who is being seen today for the evaluation of elevated troponin and ECG changes at the request of emergency department.  History of Present Illness:   [name] is a 46 year old female with history of anxiety, depression, sinus tachycardia, and H. pylori treated x2 who presented today after 2 days of severe diffuse abdominal pain.  The pain was acute onset and has been worsening and she has been able to tolerate p.o. over the last 2 days.  She has been having intermittent nausea and vomiting.  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  She developed a cough that has been persistent and worsening.  A lot of her vomiting episodes were posttussive emesis.  This continue to worsen and then her abdominal pain developed.  Given her inability to tolerate p.o. worsening abdominal pain and URI symptoms, she was brought in by EMS for evaluation.   She has been tachycardic on arrival with heart rates as high as the 160s.  She did develop some chest pain while waiting and became diaphoretic and lightheaded.  Chest pain has resolved quickly after fluid resuscitation and heart rate was lower.  Also hypothermic with temperatures 97.4 and tachypneic.  WBC 16 K, potassium 3.4, troponin 34 to 181.  ECG obtained in the chart when patient was tachycardic to 160 and notably diffuse ST elevation with some ST depression.  Cardiology consulted for recommendations given these changes.  Past Medical History:  Diagnosis Date    Anxiety    Arthritis    Asthma    Bursitis    Right Hip and Right Knee   Depression    H. pylori infection    s/p triple therapy x 2   Migraines     Past Surgical History:  Procedure Laterality Date   COSMETIC SURGERY  2008   breast implants      Home Medications:  Prior to Admission medications   Medication Sig Start Date End Date Taking? Authorizing Provider  albuterol (VENTOLIN HFA) 108 (90 Base) MCG/ACT inhaler Inhale 2 puffs into the lungs every 6 (six) hours as needed for wheezing or shortness of breath. 10/22/20   [name], FNP  ALPRAZolam (XANAX) 0.5 MG tablet 1 mg BID X 3 days, then decrease to 1 mg AM and 0.5 mg pm X 1 week, then 0.5 mg BID for 1 week, then 0.5 mg daily. 10/22/20   Jannifer Rodney A, FNP  buPROPion (WELLBUTRIN XL) 300 MG 24 hr tablet Take 1 tablet (300 mg total) by mouth daily. 10/22/20   [name], FNP  cyclobenzaprine (FLEXERIL) 10 MG tablet Take 1 tablet (10 mg total) by mouth 3 (three) times daily as needed for muscle spasms. 01/31/20   Jannifer Rodney A, FNP  diclofenac sodium (VOLTAREN) 1 % GEL Apply 2 g topically 4 (four) times daily. 06/27/18   Daphine Deutscher, Mary-Margaret, FNP  dicyclomine (BENTYL) 20 MG tablet Take 1 tablet (20 mg total) by mouth 2 (two) times daily. 10/22/20   [name], FNP  DULoxetine (CYMBALTA)  60 MG capsule Take 1 capsule (60 mg total) by mouth daily. 10/22/20   Hawks, Christy A, FNP  Galcanezumab-gnlm (EMGALITY) 120 MG/ML SOAJ Inject 120 mg into the skin every 30 (thirty) days. 04/09/20   Jannifer Rodney A, FNP  Melatonin 10 MG CAPS Take 20 mg by mouth at bedtime.    [provider]  metoprolol succinate (TOPROL-XL) 25 MG 24 hr tablet Take 1 tablet (25 mg total) by mouth daily. Please make overdue appt with Dr. Johney Frame before anymore refills. Thank you 2nd attempt 08/12/21   Hillis Range, MD  montelukast (SINGULAIR) 10 MG tablet Take 1 tablet (10 mg total) by mouth at bedtime as needed (for respiratory flares).  10/22/20   [name], FNP  ondansetron (ZOFRAN ODT) 4 MG disintegrating tablet Take 1 tablet (4 mg total) by mouth every 8 (eight) hours as needed for nausea or vomiting. 04/17/20   Gailen Shelter, PA  Oxcarbazepine (TRILEPTAL) 300 MG tablet TAKE 1 TABLET BY MOUTH AT BEDTIME 01/31/20   Hawks, Neysa Bonito A, FNP  pantoprazole (PROTONIX) 40 MG tablet Take 1 tablet (40 mg total) by mouth 2 (two) times daily before a meal. 10/22/20   Hawks, Neysa Bonito A, FNP  potassium chloride SA (KLOR-CON) 20 MEQ tablet Take 1 tablet (20 mEq total) by mouth 2 (two) times daily. 12/26/19   Mancel Bale, MD  SUMAtriptan (IMITREX) 50 MG tablet Take 1 tablet (50 mg total) by mouth once for 1 dose. May repeat ONCE in 2 hours if headache persists or recurs. 10/22/20 10/22/20  [name], FNP    Inpatient Medications: Scheduled Meds:  Continuous Infusions:  PRN Meds:   Allergies:    Allergies  Allergen Reactions   Tape Other (See Comments)    The "plastic-like tape causes redness and irritation"   Aspirin Other (See Comments)    Caused chest pain   Imitrex [Sumatriptan] Other (See Comments)    "Gave me lockjaw"    Social History:   Social History   Socioeconomic History   Marital status: Married    Spouse name: Berania Peedin   Number of children: 1   Years of education: Not on file   Highest education level: Not on file  Occupational History   Occupation: [employer]  Tobacco Use   Smoking status: Every Day    Packs/day: 1.00    Types: Cigarettes   Smokeless tobacco: Never   Tobacco comments:    Planning to quit 03/30/2019  Vaping Use   Vaping Use: Never used  Substance and Sexual Activity   Alcohol use: No   Drug use: No   Sexual activity: Yes    Birth control/protection: I.U.D., Surgical    Comment: husband has had a vasectomy  Other Topics Concern   Not on file  Social History Narrative   Right handed    Lives with Kimberly Terrell   Caffeine use: coffee-daily   Social Determinants  of Health   Financial Resource Strain: Low Risk  (07/10/2019)   Overall Financial Resource Strain (CARDIA)    Difficulty of Paying Living Expenses: Not hard at all  Food Insecurity: No Food Insecurity (07/10/2019)   Hunger Vital Sign    Worried About Running Out of Food in the Last Year: Never true    Ran Out of Food in the Last Year: Never true  Transportation Needs: No Transportation Needs (07/10/2019)   PRAPARE - Administrator, Civil Service (Medical): No    Lack of Transportation (  Non-Medical): No  Physical Activity: Sufficiently Active (07/10/2019)   Exercise Vital Sign    Days of Exercise per Week: 3 days    Minutes of Exercise per Session: 60 min  Stress: No Stress Concern Present (07/10/2019)   [employer] of Occupational Health - Occupational Stress Questionnaire    Feeling of Stress : Not at all  Social Connections: Moderately Isolated (07/10/2019)   Social Connection and Isolation Panel [NHANES]    Frequency of Communication with Friends and Family: Once a week    Frequency of Social Gatherings with Friends and Family: Once a week    Attends Religious Services: 1 to 4 times per year    Active Member of [employer] or Organizations: No    Attends Banker Meetings: Never    Marital Status: Married  Catering manager Violence: Not At Risk (07/10/2019)   Humiliation, Afraid, Rape, and Kick questionnaire    Fear of Current or Ex-Partner: No    Emotionally Abused: No    Physically Abused: No    Sexually Abused: No    Family History:   Family History  Problem Relation Age of Onset   Depression Mother    Depression Daughter    Colon cancer Neg Hx    Esophageal cancer Neg Hx    Gastric cancer Neg Hx      Review of Systems: [y] = yes, [ ]  = no    General: Weight gain [ ] ; Weight loss [ ] ; Anorexia [ ] ; Fatigue [ ] ; Fever [ ] ; Chills [ ] ; Weakness [ ]   Cardiac: Chest pain/pressure [ ] ; Resting SOB [ ] ; Exertional SOB [ ] ; Orthopnea [ ] ; Pedal Edema [  ]; Palpitations [ y]; Syncope [ ] ; Presyncope [ ] ; Paroxysmal nocturnal dyspnea[ ]   Pulmonary: Cough [ ] ; Wheezing[ ] ; Hemoptysis[ ] ; Sputum [ ] ; Snoring [ ]   GI: Vomiting[ y]; Dysphagia[ ] ; Melena[ ] ; Hematochezia [ ] ; Heartburn[ ] ; Abdominal pain ]; Constipation [ ] ; Diarrhea [ ] ; BRBPR [ ]   GU: Hematuria[ ] ; Dysuria [ ] ; Nocturia[ ]   Vascular: Pain in legs with walking [ ] ; Pain in feet with lying flat [ ] ; Non-healing sores [ ] ; Stroke [ ] ; TIA [ ] ; Slurred speech [ ] ;  Neuro: Headaches[ ] ; Vertigo[ ] ; Seizures[ ] ; Paresthesias[ ] ;Blurred vision [ ] ; Diplopia [ ] ; Vision changes [ ]   Ortho/Skin: Arthritis [ ] ; Joint pain [ ] ; Muscle pain [ ] ; Joint swelling [ ] ; Back Pain [ y]; Rash [ ]   Psych: Depression[ ] ; Anxiety[y ]  Heme: Bleeding problems [ ] ; Clotting disorders [ ] ; Anemia [ ]   Endocrine: Diabetes [ ] ; Thyroid dysfunction[ ]   Physical Exam/Data:   Vitals:   12/03/21 2154 12/04/21 0124 12/04/21 0424  BP: 137/67 (!) 147/88 105/75  Pulse: (!) 125 99 (!) 162  Resp: 18 17 (!) 30  Temp: 97.6 F (36.4 C) 98 F (36.7 C) (!) 97.4 F (36.3 C)  TempSrc:   Oral  SpO2: 97% 98% 95%    Intake/Output Summary (Last 24 hours) at 12/04/2021 0451 Last data filed at 12/03/2021 2154 Gross per 24 hour  Intake 1000 ml  Output --  Net 1000 ml   There were no vitals filed for this visit. There is no height or weight on file to calculate BMI.  General: Mild distress HEENT: normal Lymph: no adenopathy Neck: JVP flat Endocrine:  No thryomegaly Vascular: No carotid bruits; FA pulses 2+ bilaterally without bruits  Cardiac:  normal S1, S2; tachycardic  and regular with no murmur lungs: Wheezing scattered throughout Abd: Diffuse abdominal tenderness throughout, no organomegaly Ext: no edema Musculoskeletal:  No deformities, BUE and BLE strength normal and equal Skin: warm and dry  Neuro:  CNs 2-12 intact, no focal abnormalities noted Psych:  Normal affect   EKG:  The EKG was personally  reviewed and demonstrates: Diffuse ST elevation with some ST depression with heart rate of 160 Telemetry:  Telemetry was personally reviewed and demonstrates: Sinus tachycardia  Relevant CV Studies: None  Laboratory Data:  Chemistry Recent Labs  Lab 12/03/21 2200  NA 138  K 3.4*  CL 104  CO2 19*  GLUCOSE 122*  BUN 12  CREATININE 0.78  CALCIUM 9.7  GFRNONAA >60  ANIONGAP 15    Recent Labs  Lab 12/03/21 2200  PROT 6.2*  ALBUMIN 3.3*  AST 21  ALT 18  ALKPHOS 93  BILITOT 1.1   Hematology Recent Labs  Lab 12/03/21 2200  WBC 16.1*  RBC 4.74  HGB 12.7  HCT 40.4  MCV 85.2  MCH 26.8  MCHC 31.4  RDW 15.3  PLT 567*   Cardiac EnzymesNo results for input(s): "TROPONINI" in the last 168 hours. No results for input(s): "TROPIPOC" in the last 168 hours.  BNPNo results for input(s): "BNP", "PROBNP" in the last 168 hours.  DDimer No results for input(s): "DDIMER" in the last 168 hours.  Radiology/Studies:  DG Chest 2 View  Result Date: 12/04/2021 CLINICAL DATA:  Chest pain EXAM: CHEST - 2 VIEW COMPARISON:  12/26/2019 FINDINGS: Lungs are clear.  No pleural effusion or pneumothorax. The heart is normal in size. Visualized osseous structures are within normal limits. IMPRESSION: Normal chest radiographs. Electronically Signed   By: Charline Bills M.D.   On: 12/04/2021 00:55    Assessment and Plan:    # NSTEMI, type 2 MI (demand ischemia)  It is most likely that these ECG changes and troponin elevation are in the setting of her acute URI with subsequent abdominal process, dehydration, and relative low blood pressure.  However, the ECG changes are marked but they are global.  I would fluid resuscitate her to see if this helps with severe sinus tachycardia and repeat her ECG when her heart rates have lowered.  I would also get an echo for evaluation but make sure that her heart rate is better managed after fluid resuscitation.  I believe that her chest pain syndrome on arrival  was related to her tachycardia to the 160s.  Ultimately, I think she needs a medicine admission and work-up/management of URI and acute abdominal process.  We will continue to follow for further recommendations or if there is any change in her cardiac symptomatology.  -Collect a third troponin for trend; after that no further trending necessary unless she develops recurrent chest pain -Repeat ECG after fluid resuscitation and heart rate lowering or if chest pain worsens -Order echo for today -No further cardiac work-up necessary at this time       For questions or updates, please contact [facility] Please consult [email or web address] for contact info under     Signed, Joellen Jersey, MD  12/04/2021 4:51 AM

## 2021-12-04 NOTE — Anesthesia Procedure Notes (Signed)
Procedure Name: Intubation Date/Time: 12/04/2021 11:14 AM  Performed by: Lowella Dell, CRNAPre-anesthesia Checklist: Patient identified, Emergency Drugs available, Suction available and Patient being monitored Patient Re-evaluated:Patient Re-evaluated prior to induction Oxygen Delivery Method: Circle System Utilized Preoxygenation: Pre-oxygenation with 100% oxygen Induction Type: IV induction, Rapid sequence and Cricoid Pressure applied Laryngoscope Size: Mac and 3 Grade View: Grade I Tube type: Oral Tube size: 7.0 mm Number of attempts: 1 Airway Equipment and Method: Stylet Placement Confirmation: ETT inserted through vocal cords under direct vision, positive ETCO2 and breath sounds checked- equal and bilateral Secured at: 20 cm Tube secured with: Tape Dental Injury: Teeth and Oropharynx as per pre-operative assessment

## 2021-12-04 NOTE — ED Provider Notes (Signed)
[facility] EMERGENCY DEPARTMENT Provider Note   CSN: [id number] Arrival date & time: 12/03/21  2149     History  No chief complaint on file.   Kimberly Terrell is a 46 y.o. female.  The history is provided by the patient and medical records. No language interpreter was used.     46 year old female significant history of H. pylori, polysubstance use, GERD, brought here via EMS with complaint of abdominal pain.  History obtained through patient and through husband who is at bedside.  Patient has history of H. pylori in the past causing some stomach ulcer as well as having heart palpitation due to complication of H. pylori.  For the past 2 days she has had epigastric tenderness with associated nausea, and dry heaving.  She is able to drink some fluid but unable to keep anything else down.  She endorsed feeling weak, having cough and posttussis emesis.  While in the waiting room patient also complaining of pain in her chest.  Pain is mid chest, sharp and she became diaphoretic.  She does endorse some lightheadedness and nausea has been persistent mildly improved with Zofran given here.  Symptoms moderate to severe in intensity.  Patient denies regular alcohol use  or drug use.  Denies hematemesis, hematochezia or melena.  Patient denies any symptomatic cardiac history.  Home Medications Prior to Admission medications   Medication Sig Start Date End Date Taking? Authorizing Provider  albuterol (VENTOLIN HFA) 108 (90 Base) MCG/ACT inhaler Inhale 2 puffs into the lungs every 6 (six) hours as needed for wheezing or shortness of breath. 10/22/20   [name], FNP  ALPRAZolam (XANAX) 0.5 MG tablet 1 mg BID X 3 days, then decrease to 1 mg AM and 0.5 mg pm X 1 week, then 0.5 mg BID for 1 week, then 0.5 mg daily. 10/22/20   Jannifer Rodney A, FNP  buPROPion (WELLBUTRIN XL) 300 MG 24 hr tablet Take 1 tablet (300 mg total) by mouth daily. 10/22/20   [name], FNP   cyclobenzaprine (FLEXERIL) 10 MG tablet Take 1 tablet (10 mg total) by mouth 3 (three) times daily as needed for muscle spasms. 01/31/20   Jannifer Rodney A, FNP  diclofenac sodium (VOLTAREN) 1 % GEL Apply 2 g topically 4 (four) times daily. 06/27/18   Daphine Deutscher, Mary-Margaret, FNP  dicyclomine (BENTYL) 20 MG tablet Take 1 tablet (20 mg total) by mouth 2 (two) times daily. 10/22/20   [name], FNP  DULoxetine (CYMBALTA) 60 MG capsule Take 1 capsule (60 mg total) by mouth daily. 10/22/20   Hawks, Christy A, FNP  Galcanezumab-gnlm (EMGALITY) 120 MG/ML SOAJ Inject 120 mg into the skin every 30 (thirty) days. 04/09/20   Jannifer Rodney A, FNP  Melatonin 10 MG CAPS Take 20 mg by mouth at bedtime.    [provider]  metoprolol succinate (TOPROL-XL) 25 MG 24 hr tablet Take 1 tablet (25 mg total) by mouth daily. Please make overdue appt with Dr. Johney Frame before anymore refills. Thank you 2nd attempt 08/12/21   Hillis Range, MD  montelukast (SINGULAIR) 10 MG tablet Take 1 tablet (10 mg total) by mouth at bedtime as needed (for respiratory flares). 10/22/20   [name], FNP  ondansetron (ZOFRAN ODT) 4 MG disintegrating tablet Take 1 tablet (4 mg total) by mouth every 8 (eight) hours as needed for nausea or vomiting. 04/17/20   Gailen Shelter, PA  Oxcarbazepine (TRILEPTAL) 300 MG tablet TAKE 1 TABLET BY MOUTH  AT BEDTIME 01/31/20   Jannifer Rodney A, FNP  pantoprazole (PROTONIX) 40 MG tablet Take 1 tablet (40 mg total) by mouth 2 (two) times daily before a meal. 10/22/20   Hawks, Neysa Bonito A, FNP  potassium chloride SA (KLOR-CON) 20 MEQ tablet Take 1 tablet (20 mEq total) by mouth 2 (two) times daily. 12/26/19   Mancel Bale, MD  SUMAtriptan (IMITREX) 50 MG tablet Take 1 tablet (50 mg total) by mouth once for 1 dose. May repeat ONCE in 2 hours if headache persists or recurs. 10/22/20 10/22/20  [name], FNP      Allergies    Tape, Aspirin, and Imitrex [sumatriptan]    Review of Systems    Review of Systems  All other systems reviewed and are negative.   Physical Exam Updated Vital Signs BP (!) 147/88   Pulse 99   Temp 98 F (36.7 C)   Resp 17   SpO2 98%  Physical Exam Vitals and nursing note reviewed.  Constitutional:      General: She is in acute distress.     Appearance: She is well-developed. She is diaphoretic.  HENT:     Head: Atraumatic.  Eyes:     Conjunctiva/sclera: Conjunctivae normal.  Cardiovascular:     Rate and Rhythm: Tachycardia present.  Pulmonary:     Effort: Pulmonary effort is normal.     Breath sounds: No wheezing, rhonchi or rales.  Abdominal:     General: There is distension.     Tenderness: There is abdominal tenderness. There is guarding.  Musculoskeletal:     Cervical back: Neck supple.     Right lower leg: No edema.     Left lower leg: No edema.  Skin:    Findings: No rash.  Neurological:     Mental Status: She is alert.  Psychiatric:        Mood and Affect: Mood normal.     ED Results / Procedures / Treatments   Labs (all labs ordered are listed, but only abnormal results are displayed) Labs Reviewed  CBC WITH DIFFERENTIAL/PLATELET - Abnormal; Notable for the following components:      Result Value   WBC 16.1 (*)    Platelets 567 (*)    Neutro Abs 13.5 (*)    All other components within normal limits  COMPREHENSIVE METABOLIC PANEL - Abnormal; Notable for the following components:   Potassium 3.4 (*)    CO2 19 (*)    Glucose, Bld 122 (*)    Total Protein 6.2 (*)    Albumin 3.3 (*)    All other components within normal limits  URINALYSIS, ROUTINE W REFLEX MICROSCOPIC - Abnormal; Notable for the following components:   Specific Gravity, Urine 1.032 (*)    Ketones, ur 80 (*)    Protein, ur 100 (*)    All other components within normal limits  TROPONIN I (HIGH SENSITIVITY) - Abnormal; Notable for the following components:   Troponin I (High Sensitivity) 34 (*)    All other components within normal limits   TROPONIN I (HIGH SENSITIVITY) - Abnormal; Notable for the following components:   Troponin I (High Sensitivity) 181 (*)    All other components within normal limits  CULTURE, BLOOD (ROUTINE X 2)  CULTURE, BLOOD (ROUTINE X 2)  LIPASE, BLOOD  LACTIC ACID, PLASMA  LACTIC ACID, PLASMA  I-STAT BETA HCG BLOOD, ED ([facility], [facility], [facility] ONLY)    EKG EKG Interpretation  Date/Time:  Friday December 04 2021 04:25:57 EDT Ventricular  Rate:  160 PR Interval:  108 QRS Duration: 74 QT Interval:  266 QTC Calculation: 434 R Axis:   105 Text Interpretation: Sinus tachycardia Consider right atrial enlargement Inferior infarct, acute (RCA) Anterolateral infarct, acute Probable RV involvement, suggest recording right precordial leads diffuse ST elevations with some depressions as well.  possibly rate related Confirmed by Marily Memos [id number]) on 12/04/2021 4:30:09 AM  Radiology CT Angio Chest/Abd/Pel for Dissection W and/or Wo Contrast  Result Date: 12/04/2021 CLINICAL DATA:  Diffuse abdominal pain. Vomiting. History of H pylori. EXAM: CT ANGIOGRAPHY CHEST, ABDOMEN AND PELVIS TECHNIQUE: Non-contrast CT of the chest was initially obtained. Multidetector CT imaging through the chest, abdomen and pelvis was performed using the standard protocol during bolus administration of intravenous contrast. Multiplanar reconstructed images and MIPs were obtained and reviewed to evaluate the vascular anatomy. RADIATION DOSE REDUCTION: This exam was performed according to the departmental dose-optimization program which includes automated exposure control, adjustment of the mA and/or kV according to patient size and/or use of iterative reconstruction technique. CONTRAST:  OMNIPAQUE IOHEXOL 350 MG/ML SOLN COMPARISON:  Abdomen and pelvis CT 07/04/2019 FINDINGS: CTA CHEST FINDINGS Cardiovascular: Pre contrast imaging shows no hyperdense crescent in the wall of the thoracic aorta to suggest the presence of an acute intramural hematoma.  Imaging after IV contrast administration shows no thoracic aortic aneurysm. Ascending thoracic aorta measures 2.9 cm diameter. Mid descending thoracic aorta measures 1.9 cm diameter. No evidence for thoracic aortic dissection on this non gated study. Arterial arch vessel anatomy is widely patent. The heart size is normal. No substantial pericardial effusion. Mediastinum/Nodes: 9 mm short axis subcarinal lymph node on 76/6 is upper normal for size. Mild circumferential wall thickening noted mid esophagus (image 80/6) There is no hilar lymphadenopathy. There is no axillary lymphadenopathy. Lungs/Pleura: Mild centrilobular emphysema noted secretions are noted in the right mainstem bronchus and bronchus intermedius. Subtle tree-in-bud nodularity noted anterior right lower lobe on image 90/8. 4 mm left upper lobe nodule identified on 47/8. Small focus of tree-in-bud nodularity noted posterior left lower lobe on 91/8. Mild circumferential bronchial wall thickening noted both lower lobes with scattered areas of peripheral small airway impaction bilaterally. Musculoskeletal: No worrisome lytic or sclerotic osseous abnormality. Review of the MIP images confirms the above findings. CTA ABDOMEN AND PELVIS FINDINGS VASCULAR Aorta: Normal caliber aorta without aneurysm, dissection, vasculitis or significant stenosis. Celiac: Patent without evidence of aneurysm, dissection, vasculitis or significant stenosis. SMA: Patent without evidence of aneurysm, dissection, vasculitis or significant stenosis. Renals: Both renal arteries are patent without evidence of aneurysm, dissection, vasculitis, fibromuscular dysplasia or significant stenosis. IMA: Patent without evidence of aneurysm, dissection, vasculitis or significant stenosis. Inflow: Patent without evidence of aneurysm, dissection, vasculitis or significant stenosis. Veins: No obvious venous abnormality within the limitations of this arterial phase study. Review of the MIP images  confirms the above findings. NON-VASCULAR Hepatobiliary: No suspicious focal abnormality within the liver parenchyma. There is no evidence for gallstones, gallbladder wall thickening, or pericholecystic fluid. No intrahepatic or extrahepatic biliary dilation. Pancreas: No focal mass lesion. No dilatation of the main duct. No intraparenchymal cyst. No peripancreatic edema. Spleen: No splenomegaly. No focal mass lesion. Adrenals/Urinary Tract: No adrenal nodule or mass. Right kidney unremarkable. Possible areas of segmental hypoperfusion in the left kidney including anterior upper pole on 151/6, posterior interpolar region on 160/6, and lower pole on 179/6. No evidence for hydroureter. The urinary bladder appears normal for the degree of distention. Stomach/Bowel: Stomach is diffusely thick walled and appears  edematous. Extraluminal gas is identified anterior to the proximal stomach on image 153/6. Tiny extraluminal gas bubbles are seen adjacent to the proximal stomach on 142/6. Focal areas of gastric wall thinning are seen anteriorly in the body of the stomach and posteriorly towards the antral region, but no definite or discrete ulcer can be identified. Duodenal wall is ill-defined. No small bowel wall thickening. No small bowel dilatation. The terminal ileum is normal. The appendix is not well visualized, but there is no edema or inflammation in the region of the cecum. Colon is diffusely decompressed. Lymphatic: Scattered small lymph nodes are seen in the hepatoduodenal in gastrohepatic ligament. No retroperitoneal lymphadenopathy. Soft tissue attenuation anterior to the aorta on image 181/6 appears to represent small bowel on coronal imaging and is similar to the 2021 exam. No pelvic sidewall lymphadenopathy. Reproductive: The uterus is unremarkable.  There is no adnexal mass. Other: Moderate to large volume ascites evident with fluid around the liver and spleen. Average attenuation the fluid is relatively low,  making hemorrhage. Edema/fluid is seen in the gastrocolic ligament with fluid in both pericolic gutters and in the pelvis. No substantial peritoneal thickening or hyperenhancement. There is a question of a very subtle nodule along the peritoneum of the right pelvis (see image 277/6). Musculoskeletal: No worrisome lytic or sclerotic osseous abnormality. Review of the MIP images confirms the above findings. IMPRESSION: 1. No evidence for thoracic or abdominal aortic aneurysm or dissection. 2. Circumferential wall thickening in the mid esophagus suggests esophagitis. 3. Focal areas of tree-in-bud nodularity in both lungs suggest sequelae of atypical infection, including MAI. 4. Marked thickening and edema of the gastric wall with tiny extraluminal gas bubbles adjacent to the proximal stomach. While no definite or discrete ulcer can be identified, there are areas of gastric wall thinning anteriorly in the body of the stomach and posteriorly towards the antral region. Given the history of H pylori, perforated gastric ulcer would be a consideration for the intraperitoneal free air. 5. Moderate to large volume ascites with fluid around the liver and spleen. Average attenuation the fluid is relatively low, not favoring hemorrhage. 6. Possible areas of segmental hypoperfusion in the left kidney, raising the possibility of pyelonephritis. 7. There is a question of a very subtle nodule along the peritoneum of the right pelvis. After resolution of patient's acute symptoms, follow-up standard abdomen and pelvis CT with oral and intravenous contrast recommended to further evaluate. 8. 4 mm left upper lobe pulmonary nodule. No follow-up needed if patient is low-risk.This recommendation follows the consensus statement: Guidelines for Management of Incidental Pulmonary Nodules Detected on CT Images: From the Fleischner Society 2017; Radiology 2017; 284:228-243. 9. Emphysema (ICD10-J43.9). Critical Value/emergent results were called  by telephone at the time of interpretation on 12/04/2021 at 5:52 am to provider [facility] , who verbally acknowledged these results. Electronically Signed   By: Kennith Center M.D.   On: 12/04/2021 05:53   DG Chest 2 View  Result Date: 12/04/2021 CLINICAL DATA:  Chest pain EXAM: CHEST - 2 VIEW COMPARISON:  12/26/2019 FINDINGS: Lungs are clear.  No pleural effusion or pneumothorax. The heart is normal in size. Visualized osseous structures are within normal limits. IMPRESSION: Normal chest radiographs. Electronically Signed   By: Charline Bills M.D.   On: 12/04/2021 00:55    Procedures .Critical Care  Performed by: Fayrene Helper, PA-C Authorized by: Fayrene Helper, PA-C   Critical care provider statement:    Critical care time (minutes):  75   Critical care  was time spent personally by me on the following activities:  Development of treatment plan with patient or surrogate, discussions with consultants, evaluation of patient's response to treatment, examination of patient, ordering and review of laboratory studies, ordering and review of radiographic studies, ordering and performing treatments and interventions, pulse oximetry, re-evaluation of patient's condition and review of old charts     Medications Ordered in ED Medications  piperacillin-tazobactam (ZOSYN) IVPB 3.375 g (3.375 g Intravenous New Bag/Given 12/04/21 0614)  alum & mag hydroxide-simeth (MAALOX/MYLANTA) 200-200-20 MG/5ML suspension 30 mL (30 mLs Oral Given 12/04/21 0022)  morphine (PF) 4 MG/ML injection 4 mg (4 mg Intravenous Given 12/04/21 0309)  ondansetron (ZOFRAN) injection 4 mg (4 mg Intravenous Given 12/04/21 0309)  fentaNYL (SUBLIMAZE) injection 50 mcg (50 mcg Intravenous Given 12/04/21 0440)  sodium chloride 0.9 % bolus 1,000 mL (1,000 mLs Intravenous New Bag/Given 12/04/21 0442)  ondansetron (ZOFRAN) injection 4 mg (4 mg Intravenous Given 12/04/21 0440)  LORazepam (ATIVAN) injection 1 mg (1 mg Intravenous Given 12/04/21 0440)  iohexol  (OMNIPAQUE) 350 MG/ML injection 100 mL (100 mLs Intravenous Contrast Given 12/04/21 0516)  HYDROmorphone (DILAUDID) injection 1 mg (1 mg Intravenous Given 12/04/21 0536)    ED Course/ Medical Decision Making/ A&P                           Medical Decision Making Amount and/or Complexity of Data Reviewed Labs: ordered. Radiology: ordered. ECG/medicine tests: ordered.  Risk OTC drugs. Prescription drug management.   BP 105/75 (BP Location: Right Arm)   Pulse (!) 162   Temp (!) 97.4 F (36.3 C) (Oral)   Resp (!) 30   SpO2 95%   4:35 AM This is a 46 year old female here with upper abdominal pain and associate nausea vomiting for the past 2 days.  While in the waiting room which she also developed pain about her mid chest and became diaphoretic.  She has significant history of H. pylori in the past which was treated but has been reports she has some complication in which she has stomach ulcer as well as having recurrent heart palpitation due to H. pylori complication.  Patient is a smoker but denies alcohol or drug use.  Currently symptoms moderate in severity.  On exam this is a female appears older than her stated age.  She appears uncomfortable, diaphoretic.  On exam she has a tense abdomen with tenderness to her epigastric region.  No abdominal bruit or pulsatile mass appreciated.  She is guarding.  She is noted to be tachycardic with heart rate in the 160s.  Labs, EKG, imaging obtained independently viewed interpreted by me and I agree with radiologist interpretation.  Initial troponin was elevated at 34, EKG at that time without any concerning finding except mild tachycardia.  Repeat troponin is now 181.  Repeat EKG shows diffuse ST elevation with fast heart rate in the 160 and signs of acute MI.  Cardiologist was promptly consulted and at this cardiologist recommend hold off on heparinized patient as it could be a troponin leak due to her elevated heart rate.  Since patient also has both  abdominal and chest tenderness, plan to obtain a prompt dissection study however if negative, will consider heparinize patient.  Cardiologist will also be involved in patient care.  Patient is made aware of plan and agrees with plan.  Care discussed with Dr. Clayborne Dana.   5:07 AM Patient was promptly taken to the CT scanner.  Dissection  study was performed.  On initial wet read, no obvious signs of dissection however it appears there are copious amount of fluid surrounding her liver.  I have requested for radiologist to probably repeat the CT result.  Given history of H. pylori and history of gastric ulcer, concerns for perforation.  I have ordered lactic acid, and blood culture, have initiated antibiotic including Zosyn and will reach out to general surgery for evaluation.  Blood pressure appears improved with IV fluid and heart rate improved mildly.  Patient however is hypoxic with O2 sats of 88%, will place on supplemental O2.  Suspect hypoxia is likely splinting from pain.    5:32 AM On reassessment, blood pressure improved to 132 systolic, heart rate also improved to the 120s.  Patient still endorse significant abdominal discomfort.  Will provide stronger pain medication for symptom control.  I appreciate consultation from general surgeon Dr. Janee Morn who agrees to be involved in patient care and will see patient in the ER.  6:01 AM Radiologist has informed me that patient has evidence of perforation.  This is likely a gastric ulcer perforation as there small mount of free air around her stomach.  She also has evidence of chronic esophagitis on CT scan.  Her left kidney appears inflamed suggestive of potential pyelonephritis.  She also has some reactive lymph nodes around her ovaries/uterus.  Patient is recommended to have a repeat CT scan in a month to ensure this is not a malignancy.  I discussed these findings with patient.  Furthermore, patient reported having cold symptoms prior to the onset of  this.  She has been coughing fairly violently.  I suspect her perforation could be secondary to excessive cough.  No evidence of dissection on CT.  This patient presents to the ED for concern of abd pain, this involves an extensive number of treatment options, and is a complaint that carries with it a high risk of complications and morbidity.  The differential diagnosis includes bowel perforation, gastric perforation, dissection, PE, gastritis, kidney stone, colitis, pancreatitis, cholecystitis, appendicitis, acs, pna  Co morbidities that complicate the patient evaluation H.pylori Additional history obtained:  Additional history obtained from husband External records from outside source obtained and reviewed including EMR including prior labs and imaging  Lab Tests:  I Ordered, and personally interpreted labs.  The pertinent results include:  as above  Imaging Studies ordered:  I ordered imaging studies including dissection study I independently visualized and interpreted imaging which showed gastric perforation I agree with the radiologist interpretation  Cardiac Monitoring:  The patient was maintained on a cardiac monitor.  I personally viewed and interpreted the cardiac monitored which showed an underlying rhythm of: sinus tachycardia  Medicines ordered and prescription drug management:  I ordered medication including dilaudid  for abd pain Reevaluation of the patient after these medicines showed that the patient improved I have reviewed the patients home medicines and have made adjustments as needed  Test Considered: as above  Critical Interventions: abx for intraabdominal infection  IVF  Opiate pain medication    Consultations Obtained:  I requested consultation with the general surgery DR. Janee Morn,  and discussed lab and imaging findings as well as pertinent plan - they recommend: admission  Problem List / ED Course: gastric perforation  Demand  ischemia  Reevaluation:  After the interventions noted above, I reevaluated the patient and found that they have :improved  Social Determinants of Health: tobacco use  Dispostion:  After consideration of the diagnostic results and  the patients response to treatment, I feel that the patent would benefit from admission.         Final Clinical Impression(s) / ED Diagnoses Final diagnoses:  Gastric perforation, acute  Demand ischemia [facility])    Rx / DC Orders ED Discharge Orders     None         Fayrene Helper, PA-C 12/04/21 8144    Mesner, Barbara Cower, MD 12/05/21 [phone]

## 2021-12-04 NOTE — ED Notes (Signed)
Pt's husband stopped lobby staff and reported wife was having chest pain. Pt pulled back to triage for troponin and EKG. On assessment, pt denies chest pain, reports abd pain. Pain medication requested from PA. GI Cocktail given per order

## 2021-12-04 NOTE — H&P (Signed)
ACS RISK CALCULATOR USE:  Risk Calculator was used for discussion of surgery: Yes    Outcomes     Your Risk   Average Risk   Chance of Outcome [id number]%Serious Complication13.8%16.2%Below [id number]%Any Complication16.8%20.0%Below [id number]%Pneumonia2.0%2.8%Below [id number]%Cardiac Complication0.4%1.6%Below [id number]%Surgical Site Infection4.2%4.9%Below [id number]%Urinary Tract Infection0.9%1.6%Below [id number]%Venous Thromboembolism1.3%2.3%Below [id number]%Renal Failure0.8%1.4%Below [id number]%Readmission12.0%10.4%Above [id number]%Return to OR6.3%4.4%Above [id number]%Death1.4%3.9%Below [id number]%Discharge to Nursing or Rehab Facility5.3%10.2%Below [id number]%Sepsis0.0%2.1%Below Average Predicted Length of Hospital Stay: 5 days     Surgeon Adjustment of Risks  This will need to be used infrequently, but surgeons may adjust the estimated risks if they feel the calculated risks are underestimated. This should only be done if the reason for the increased risks was NOT already entered into the risk calculator.                                         Step 3 of 4     2007 - 2023, [employer] Surgical Quality Improvement [employer]

## 2021-12-04 NOTE — Hospital Course (Addendum)
46 yo female with a PMH of anxiety, arthritis, asthma (well controlled), h/o H pylori and PUD who presents with acutely worsened abdominal pain and admitted for perforated gastric ulcer.    #Perforated viscus  #Post-op Fever *Ex-lap w/ biopsy of gastric ulcer on 9/8. *To remain NPO w/ NG tube and JP drain in place. *Febrile overnight on 9/10 w/ increase O2 requirements and productive cough. *New moderate bilateral pleural effusions w/ atelectasis and consolidation on CXR. *Breathing improved after D5-LR drip was discontinued and IV lasix 20 mg given. *Likely secondary to IV fluids and mild acute HFrEF. Fever resolved w/ tylenol and remains afebrile. *Satting well on 2L this morning. *Hgb improved today. *Blood cultures remain negative. *Leukocytosis improved today. *Abdominal pain today. Initially we were planning on CT for concern of possible abdominal abscess. *Spoke with surgery who plans for UGI series today instead.  - *IV zosyn day 5/6 (*surgery wants 5 days post op abx) - IV protonix  - H. Pylori pending  - Dilaudid PCA pump for pain control  - Tylenol PRN - Continue ambulation and incentive spirometer  CT abd on 9/19 demonstrated stable to slightly improved peritoneal fluid collections w/o new collections. Surgery removed JP drain on 9/19.   #Pericarditis #Elevated troponins and diffuse ST elevations #Vtach *Troponin peaked at 10K on 9/9. *Elevated troponin suspected to be from peritonitis and stress induced CM. *Was unable to receive a cath due to recent surgery. *Echo demonstrates EF 40-45%, LV dysfunction, RV enlargement, and mild TR. *Had episode of asymptomatic ventricular tachycardia on 9/9 w/ unchanged EKG. *Remains tachycardic today.  - Hold antiplatelets per cards  - Toprol 12.5 mg once surgically cleared for PO per cards  - Lopressor 5 mg per cards for episode of NSVT - Repeat echo in 3 months per cards - Cardiac monitoring  Echo on 9/18 demonstrated EF 55-60% with improved  LV function.   # Diarrhea  Has chronic diarrhea history. Cryptosporidium and C. Diff. negative in 08/2021. 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 Pylori. *Still tachycardic but afebrile. I suspect the patient's symptoms related to recently restarting her diet. *Will work up for celiac disease which could be causing her chronic diarrhea. Having gas today.  - Pending Tissue transglutaminase IgA  - Pending Total IgA  - Full liquid diet as tolerated  - Simethicone for flatulence  Total IgA and tissue transglutaminase WNL.   # Hypokalemia # Hypomagnesemia  *Potassium 3.3 this morning. *Was given IV potassium and IV magnesium. *Magnesium today.  - IV potassium today - Trend BMP   #Asthma  # Incidental Pulmonary Nodule # Tobacco use *Has long-term smoking history.*On albuterol at home. *4 mm left upper lobe pulmonary nodule on CT here. *Satting well on 2L this morning  - Albuterol  - PFTs as outpatient - Recommend imaging surveillance of pulmonary nodule as outpatient - Recommend smoking cessation. - Nicotine patch    #Arthritis  Chronic.  - Recommend rheumatology f/u - Avoiding NSAIDs due to perforated ulcer - Voltaren gel   #Dispo PT does not recommend PT f/u.    Diet: NPO Bowel: none VTE: Lovenox IVF: none Code: Full code   Prior to Admission Living Arrangement: home w/ grandchildren Anticipated Discharge Location: home w/ grandchildren Barriers to Discharge: continued management Dispo: Anticipated discharge in approximately more than 2 day(s).    Karoline Caldwell, MD 12/08/2021, 6:32 AM  9/14 - continued cough. Threw up oral meds yesterday. Diarrhea  and vomiting. Patients spirits down today.   9/15 - paced around room. Feeling more sore today. Pain is slightly worse today. Tolerating clear liquids. Wanting breathing tx. Feels more comfortable with o2. No pain or dysuria.  Had  8-10 small BM yesterday green liquid, no blood.  ***********************************************************  #Perforated viscus  #Post-op Fever # Abdominal abscesses Admitted with acute abdominal pain, found to have perforated gastric ulcer with ex lap and repair on 09/08. Placed on 6 days Zosyn. Pain adequately controlled with IV Dilaudid. Developed worsening leukocytosis post op, with negative blood cultures.  Found to have 2 post-operative abdominal abscesses on CT.  Placed back on Zosyn x5 days, with improvement of white count and abscesses. Remained afebrile.  Discharged on 5 days oral Augmentin to complete a 10-day course.   #Pericarditis #Elevated troponins and diffuse ST elevations #Vtach Presented with complaints of chest pain on 09/08 with EKG revealing diffuse ST elevations and steady increase in troponin, peaking around 10,000. Echo demonstrates reduced LVEF 40-45%. Had episode of asymptomatic ventricular tachycardia on 9/9 w/ unchanged EKG. Suspected stress induced cardiomyopathy secondary to perforated gastric ulcer. Poor catheter candidate given her recent gastric surgery. Medically managed with GDMT of Toprol and Entresto. Repeat echo on 09/18 revealed improved EF of 55-60%. Discharged with instructions to continue Entresto.   # Diarrhea Patient has chronic history of diarrhea. Symptoms remained unchanged from chronic. Celiac disease testing negative. Patient also complaining of excess flatus. Started on simethicone.   # Hypokalemia # Hypomagnesemia Patient hypokalemic throughout her stay. Replenished with potassium chloride.   #Asthma  # Incidental Pulmonary Nodule # Tobacco use Has long term history of smoking and asthma. On albuterol at home which was continued during her course. Oxygen saturation remained WNL on 2L with eventual transition to room air. Found to have 4 mm upper lobe pulmonary nodule on CT scan. Recommended PFT and surveillance of pulmonary nodule as  outpatient. Recommend smoking cessation. Given nicotine path.   #Arthritis  Patient with a history of chronic arthritis. Given Voltaren gel. Recommend rheumatology follow up as outpatient.

## 2021-12-04 NOTE — H&P (Signed)
History and physical  Kimberly Terrell [birth year]  [id number].    Requesting MD: Dr. Donnald Garre Chief Complaint/Reason for Consult: perforated gastric ulcer  HPI:  46 y.o. female with medical history significant for h. Pylori infection, gastric ulcers, GERD, anxiety, depression who presented to [facility] ED via EMS with abdominal pain, nausea, emesis. She started have nausea and emesis 2-3 days ago which has been worsening and then developed severe abdominal pain yesterday. Pain is diffuse, severe, and worsening. She has tried to sip on water in the last 24 hours but has had minimal intake and continued emesis.  She has a known history of H. pylori infection and gastric ulcers.  She had an endoscopy in 2021. Husband is bedside and assists with history  Work up in ED significant for CT with intraperitoneal free air and large volume ascites for which we have been asked to evaluated. She has also had chest pain, tachycardia and elevated troponin for which cardiology has been consulted  She admits cigarette use - 1 ppd for "many year" and denies alcohol and other substance use  No prior abdominal surgeries   ROS: Review of Systems  Constitutional:  Negative for fever.  Respiratory:  Positive for cough and sputum production.   Cardiovascular:  Positive for chest pain and palpitations.  Gastrointestinal:  Positive for abdominal pain, nausea and vomiting.    Family History  Problem Relation Age of Onset   Depression Mother    Depression Daughter    Colon cancer Neg Hx    Esophageal cancer Neg Hx    Gastric cancer Neg Hx     Past Medical History:  Diagnosis Date   Anxiety    Arthritis    Asthma    Bursitis    Right Hip and Right Knee   Depression    H. pylori infection    s/p triple therapy x 2   Migraines     Past Surgical History:  Procedure Laterality Date   COSMETIC SURGERY  2008   breast implants     Social History:  reports that she has been smoking cigarettes.  She has been smoking an average of 1 pack per day. She has never used smokeless tobacco. She reports that she does not drink alcohol and does not use drugs.  Allergies:  Allergies  Allergen Reactions   Tape Other (See Comments)    The "plastic-like tape causes redness and irritation"   Aspirin Other (See Comments)    Caused chest pain   Imitrex [Sumatriptan] Other (See Comments)    "Gave me lockjaw"    (Not in a hospital admission)   Blood pressure 113/77, pulse (!) 147, temperature (!) 97.4 F (36.3 C), temperature source Oral, resp. rate 20, SpO2 96 %. Physical Exam: General: pleasant, WD, female who is laying in bed. Ill appearing HEENT: head is normocephalic, atraumatic.  Sclera are noninjected.  Pupils equal and round. EOMs intact.  Ears and nose without any masses or lesions.  Mouth is pink and moist Heart: tachycardic. regular rhythm.  Normal s1,s2. No obvious murmurs, gallops, or rubs noted.  Palpable radial and pedal pulses bilaterally Lungs: Respiratory effort nonlabored Abd: soft. Diffuse TTP with peritonitis. No hernias MSK: all 4 extremities are symmetrical with no cyanosis, clubbing, or edema. Skin: warm and dry with no masses, lesions, or rashes Neuro: Cranial nerves 2-12 grossly intact, sensation is normal throughout Psych: A&Ox3 with an appropriate affect.    Results for orders placed or performed during  the hospital encounter of 12/03/21 (from the past 48 hour(s))  Urinalysis, Routine w reflex microscopic Urine, Clean Catch     Status: Abnormal   Collection Time: 12/03/21  9:52 PM  Result Value Ref Range   Color, Urine YELLOW YELLOW   APPearance CLEAR CLEAR   Specific Gravity, Urine 1.032 (H) 1.005 - 1.030   pH 5.0 5.0 - 8.0   Glucose, UA NEGATIVE NEGATIVE mg/dL   Hgb urine dipstick NEGATIVE NEGATIVE   Bilirubin Urine NEGATIVE NEGATIVE   Ketones, ur 80 (A) NEGATIVE mg/dL   Protein, ur 740 (A) NEGATIVE mg/dL   Nitrite NEGATIVE NEGATIVE   Leukocytes,Ua  NEGATIVE NEGATIVE   RBC / HPF 0-5 0 - 5 RBC/hpf   WBC, UA 0-5 0 - 5 WBC/hpf   Bacteria, UA NONE SEEN NONE SEEN   Squamous Epithelial / LPF 0-5 0 - 5   Mucus PRESENT    Hyaline Casts, UA PRESENT     Comment: Performed at [facility], [address], [place], [place] [address]  CBC with Differential     Status: Abnormal   Collection Time: 12/03/21 10:00 PM  Result Value Ref Range   WBC 16.1 (H) 4.0 - 10.5 K/uL   RBC 4.74 3.87 - 5.11 MIL/uL   Hemoglobin 12.7 12.0 - 15.0 g/dL   HCT 18.5 63.1 - 49.7 %   MCV 85.2 80.0 - 100.0 fL   MCH 26.8 26.0 - 34.0 pg   MCHC 31.4 30.0 - 36.0 g/dL   RDW 02.6 37.8 - 58.8 %   Platelets 567 (H) 150 - 400 K/uL   nRBC 0.0 0.0 - 0.2 %   Neutrophils Relative % 85 %   Neutro Abs 13.5 (H) 1.7 - 7.7 K/uL   Lymphocytes Relative 10 %   Lymphs Abs 1.6 0.7 - 4.0 K/uL   Monocytes Relative 5 %   Monocytes Absolute 0.8 0.1 - 1.0 K/uL   Eosinophils Relative 0 %   Eosinophils Absolute 0.1 0.0 - 0.5 K/uL   Basophils Relative 0 %   Basophils Absolute 0.1 0.0 - 0.1 K/uL   Immature Granulocytes 0 %   Abs Immature Granulocytes 0.07 0.00 - 0.07 K/uL    Comment: Performed at [facility], [address] [place], [place] [address]  Comprehensive metabolic panel     Status: Abnormal   Collection Time: 12/03/21 10:00 PM  Result Value Ref Range   Sodium 138 135 - 145 mmol/L   Potassium 3.4 (L) 3.5 - 5.1 mmol/L   Chloride 104 98 - 111 mmol/L   CO2 19 (L) 22 - 32 mmol/L   Glucose, Bld 122 (H) 70 - 99 mg/dL    Comment: Glucose reference range applies only to samples taken after fasting for at least 8 hours.   BUN 12 6 - 20 mg/dL   Creatinine, Ser 4.12 0.44 - 1.00 mg/dL   Calcium 9.7 8.9 - 87.8 mg/dL   Total Protein 6.2 (L) 6.5 - 8.1 g/dL   Albumin 3.3 (L) 3.5 - 5.0 g/dL   AST 21 15 - 41 U/L   ALT 18 0 - 44 U/L   Alkaline Phosphatase 93 38 - 126 U/L   Total Bilirubin 1.1 0.3 - 1.2 mg/dL   GFR, Estimated >67 >67 mL/min    Comment: (NOTE) Calculated using  the CKD-EPI Creatinine Equation (2021)    Anion gap 15 5 - 15    Comment: Performed at [facility], [address], [place],  [place] [address]  Lipase, blood     Status: None   Collection Time: 12/03/21 10:00 PM  Result Value Ref Range   Lipase 22 11 - 51 U/L    Comment: Performed at [facility], [address], [place], [place] [address]  I-Stat beta hCG blood, ED     Status: None   Collection Time: 12/03/21 10:13 PM  Result Value Ref Range   I-stat hCG, quantitative <5.0 <5 mIU/mL   Comment 3            Comment:   GEST. AGE      CONC.  (mIU/mL)   <=1 WEEK        5 - 50     2 WEEKS       50 - 500     3 WEEKS       100 - 10,000     4 WEEKS     1,000 - 30,000        FEMALE AND NON-PREGNANT FEMALE:     LESS THAN 5 mIU/mL   Troponin I (High Sensitivity)     Status: Abnormal   Collection Time: 12/04/21 12:19 AM  Result Value Ref Range   Troponin I (High Sensitivity) 34 (H) <18 ng/L    Comment: (NOTE) Elevated high sensitivity troponin I (hsTnI) values and significant  changes across serial measurements may suggest ACS but many other  chronic and acute conditions are known to elevate hsTnI results.  Refer to the "Links" section for chest pain algorithms and additional  guidance. Performed at [facility], [address] [place], [place] [address]   Troponin I (High Sensitivity)     Status: Abnormal   Collection Time: 12/04/21  2:57 AM  Result Value Ref Range   Troponin I (High Sensitivity) 181 (HH) <18 ng/L    Comment: CRITICAL RESULT CALLED TO, READ BACK BY AND VERIFIED WITH B. OLDLAND, RN, [phone] 12/04/21, A. RAMSEY (NOTE) Elevated high sensitivity troponin I (hsTnI) values and significant  changes across serial measurements may suggest ACS but many other  chronic and acute conditions are known to elevate hsTnI results.  Refer to the "Links" section for chest pain algorithms and additional  guidance. Performed at [facility], [address],  [place], [place] [address]   Blood culture (routine x 2)     Status: None (Preliminary result)   Collection Time: 12/04/21  5:40 AM   Specimen: BLOOD RIGHT FOREARM  Result Value Ref Range   Specimen Description BLOOD RIGHT FOREARM    Special Requests      BOTTLES DRAWN AEROBIC AND ANAEROBIC Blood Culture results may not be optimal due to an inadequate volume of blood received in culture bottles   Culture      NO GROWTH <12 HOURS Performed at [facility], [address] [place], [place] [address]    Report Status PENDING   Lactic acid, plasma     Status: Abnormal   Collection Time: 12/04/21  5:40 AM  Result Value Ref Range   Lactic Acid, Venous 2.6 (HH) 0.5 - 1.9 mmol/L    Comment: CRITICAL RESULT CALLED TO, READ BACK BY AND VERIFIED WITH Wilhemena Durie, RN, [phone] 12/04/21, Mliss Sax Performed at [facility], [address] [place], [place] [address]    CT Angio Chest/Abd/Pel for Dissection W and/or Wo Contrast  Result Date: 12/04/2021 CLINICAL DATA:  Diffuse abdominal pain. Vomiting. History of H pylori. EXAM: CT  ANGIOGRAPHY CHEST, ABDOMEN AND PELVIS TECHNIQUE: Non-contrast CT of the chest was initially obtained. Multidetector CT imaging through the chest, abdomen and pelvis was performed using the standard protocol during bolus administration of intravenous contrast. Multiplanar reconstructed images and MIPs were obtained and reviewed to evaluate the vascular anatomy. RADIATION DOSE REDUCTION: This exam was performed according to the departmental dose-optimization program which includes automated exposure control, adjustment of the mA and/or kV according to patient size and/or use of iterative reconstruction technique. CONTRAST:  OMNIPAQUE IOHEXOL 350 MG/ML SOLN COMPARISON:  Abdomen and pelvis CT 07/04/2019 FINDINGS: CTA CHEST FINDINGS Cardiovascular: Pre contrast imaging shows no hyperdense crescent in the wall of the thoracic aorta to suggest the presence of an acute intramural  hematoma. Imaging after IV contrast administration shows no thoracic aortic aneurysm. Ascending thoracic aorta measures 2.9 cm diameter. Mid descending thoracic aorta measures 1.9 cm diameter. No evidence for thoracic aortic dissection on this non gated study. Arterial arch vessel anatomy is widely patent. The heart size is normal. No substantial pericardial effusion. Mediastinum/Nodes: 9 mm short axis subcarinal lymph node on 76/6 is upper normal for size. Mild circumferential wall thickening noted mid esophagus (image 80/6) There is no hilar lymphadenopathy. There is no axillary lymphadenopathy. Lungs/Pleura: Mild centrilobular emphysema noted secretions are noted in the right mainstem bronchus and bronchus intermedius. Subtle tree-in-bud nodularity noted anterior right lower lobe on image 90/8. 4 mm left upper lobe nodule identified on 47/8. Small focus of tree-in-bud nodularity noted posterior left lower lobe on 91/8. Mild circumferential bronchial wall thickening noted both lower lobes with scattered areas of peripheral small airway impaction bilaterally. Musculoskeletal: No worrisome lytic or sclerotic osseous abnormality. Review of the MIP images confirms the above findings. CTA ABDOMEN AND PELVIS FINDINGS VASCULAR Aorta: Normal caliber aorta without aneurysm, dissection, vasculitis or significant stenosis. Celiac: Patent without evidence of aneurysm, dissection, vasculitis or significant stenosis. SMA: Patent without evidence of aneurysm, dissection, vasculitis or significant stenosis. Renals: Both renal arteries are patent without evidence of aneurysm, dissection, vasculitis, fibromuscular dysplasia or significant stenosis. IMA: Patent without evidence of aneurysm, dissection, vasculitis or significant stenosis. Inflow: Patent without evidence of aneurysm, dissection, vasculitis or significant stenosis. Veins: No obvious venous abnormality within the limitations of this arterial phase study. Review of the  MIP images confirms the above findings. NON-VASCULAR Hepatobiliary: No suspicious focal abnormality within the liver parenchyma. There is no evidence for gallstones, gallbladder wall thickening, or pericholecystic fluid. No intrahepatic or extrahepatic biliary dilation. Pancreas: No focal mass lesion. No dilatation of the main duct. No intraparenchymal cyst. No peripancreatic edema. Spleen: No splenomegaly. No focal mass lesion. Adrenals/Urinary Tract: No adrenal nodule or mass. Right kidney unremarkable. Possible areas of segmental hypoperfusion in the left kidney including anterior upper pole on 151/6, posterior interpolar region on 160/6, and lower pole on 179/6. No evidence for hydroureter. The urinary bladder appears normal for the degree of distention. Stomach/Bowel: Stomach is diffusely thick walled and appears edematous. Extraluminal gas is identified anterior to the proximal stomach on image 153/6. Tiny extraluminal gas bubbles are seen adjacent to the proximal stomach on 142/6. Focal areas of gastric wall thinning are seen anteriorly in the body of the stomach and posteriorly towards the antral region, but no definite or discrete ulcer can be identified. Duodenal wall is ill-defined. No small bowel wall thickening. No small bowel dilatation. The terminal ileum is normal. The appendix is not well visualized, but there is no edema or inflammation in the region of the cecum. Colon  is diffusely decompressed. Lymphatic: Scattered small lymph nodes are seen in the hepatoduodenal in gastrohepatic ligament. No retroperitoneal lymphadenopathy. Soft tissue attenuation anterior to the aorta on image 181/6 appears to represent small bowel on coronal imaging and is similar to the 2021 exam. No pelvic sidewall lymphadenopathy. Reproductive: The uterus is unremarkable.  There is no adnexal mass. Other: Moderate to large volume ascites evident with fluid around the liver and spleen. Average attenuation the fluid is  relatively low, making hemorrhage. Edema/fluid is seen in the gastrocolic ligament with fluid in both pericolic gutters and in the pelvis. No substantial peritoneal thickening or hyperenhancement. There is a question of a very subtle nodule along the peritoneum of the right pelvis (see image 277/6). Musculoskeletal: No worrisome lytic or sclerotic osseous abnormality. Review of the MIP images confirms the above findings. IMPRESSION: 1. No evidence for thoracic or abdominal aortic aneurysm or dissection. 2. Circumferential wall thickening in the mid esophagus suggests esophagitis. 3. Focal areas of tree-in-bud nodularity in both lungs suggest sequelae of atypical infection, including MAI. 4. Marked thickening and edema of the gastric wall with tiny extraluminal gas bubbles adjacent to the proximal stomach. While no definite or discrete ulcer can be identified, there are areas of gastric wall thinning anteriorly in the body of the stomach and posteriorly towards the antral region. Given the history of H pylori, perforated gastric ulcer would be a consideration for the intraperitoneal free air. 5. Moderate to large volume ascites with fluid around the liver and spleen. Average attenuation the fluid is relatively low, not favoring hemorrhage. 6. Possible areas of segmental hypoperfusion in the left kidney, raising the possibility of pyelonephritis. 7. There is a question of a very subtle nodule along the peritoneum of the right pelvis. After resolution of patient's acute symptoms, follow-up standard abdomen and pelvis CT with oral and intravenous contrast recommended to further evaluate. 8. 4 mm left upper lobe pulmonary nodule. No follow-up needed if patient is low-risk.This recommendation follows the consensus statement: Guidelines for Management of Incidental Pulmonary Nodules Detected on CT Images: From the Fleischner Society 2017; Radiology 2017; 284:228-243. 9. Emphysema (ICD10-J43.9). Critical Value/emergent  results were called by telephone at the time of interpretation on 12/04/2021 at 5:52 am to provider [facility] , who verbally acknowledged these results. Electronically Signed   By: Kennith Center M.D.   On: 12/04/2021 05:53   DG Chest 2 View  Result Date: 12/04/2021 CLINICAL DATA:  Chest pain EXAM: CHEST - 2 VIEW COMPARISON:  12/26/2019 FINDINGS: Lungs are clear.  No pleural effusion or pneumothorax. The heart is normal in size. Visualized osseous structures are within normal limits. IMPRESSION: Normal chest radiographs. Electronically Signed   By: Charline Bills M.D.   On: 12/04/2021 00:55      Assessment/Plan Intraperitoneal free air likely related to perforated gastric ulcer Patient seen and examined.  History and presentation are concerning for perforated gastric ulcer. Patient has a history of H. pylori and confirmed gastric ulcers on endoscopy.  CT with findings concerning for esophagitis as well as showing intraperitoneal free air and ascites.  Recommend urgent intervention in the OR with exploratory laparotomy. Discussed this procedure at length with patient and her husband who is bedside and they are in agreement to proceed  Of note, she has also had chest pain and abnormal troponin and EKG findings.  Cardiology has been consulted and cleared her for urgent surgery.  She is getting a stat echo completed prior to surgery - findings are pending  FEN: NPO for OR ID: zosyn in the ED VTE: SCDs, Lovenox post op  Chest pain with elevated troponin History of h pylori infection Nodularity within lungs and 4 mm LUL nodule - possible sequelae of atypical infection. May need repeat CT to follow Nodule along peritoneum of R pelvis - incidental finding on CT. Repeat CT once acute problem resolved  I reviewed ED provider notes, Consultant Cardiology notes, last 24 h vitals and pain scores, last 48 h intake and output, last 24 h labs and trends, and last 24 h imaging results.    Eric Form,  [facility] 12/04/2021, 8:10 AM Please see Amion for pager number during day hours 7:00am-4:30pm

## 2021-12-05 DIAGNOSIS — R778 Other specified abnormalities of plasma proteins: Secondary | ICD-10-CM | POA: Diagnosis not present

## 2021-12-05 DIAGNOSIS — K631 Perforation of intestine (nontraumatic): Secondary | ICD-10-CM | POA: Diagnosis not present

## 2021-12-05 LAB — TROPONIN I (HIGH SENSITIVITY)
Troponin I (High Sensitivity): 9474 ng/L (ref <18)
Troponin I (High Sensitivity): 10518 ng/L (ref <18)
Troponin I (High Sensitivity): 9799 ng/L (ref <18)

## 2021-12-05 LAB — COMPREHENSIVE METABOLIC PANEL
ALT: 26 U/L (ref 0–44)
AST: 70 U/L — ABNORMAL HIGH (ref 15–41)
Albumin: 2.1 g/dL — ABNORMAL LOW (ref 3.5–5.0)
Alkaline Phosphatase: 49 U/L (ref 38–126)
Anion gap: 8 (ref 5–15)
BUN: 15 mg/dL (ref 6–20)
CO2: 23 mmol/L (ref 22–32)
Calcium: 7.6 mg/dL — ABNORMAL LOW (ref 8.9–10.3)
Chloride: 104 mmol/L (ref 98–111)
Creatinine, Ser: 0.91 mg/dL (ref 0.44–1.00)
GFR, Estimated: >60 mL/min (ref >60)
Glucose, Bld: 142 mg/dL — ABNORMAL HIGH (ref 70–99)
Potassium: 3.9 mmol/L (ref 3.5–5.1)
Sodium: 135 mmol/L (ref 135–145)
Total Bilirubin: 1 mg/dL (ref 0.3–1.2)
Total Protein: 4.7 g/dL — ABNORMAL LOW (ref 6.5–8.1)

## 2021-12-05 LAB — CBC
HCT: 29.1 % — ABNORMAL LOW (ref 36.0–46.0)
Hemoglobin: 9.7 g/dL — ABNORMAL LOW (ref 12.0–15.0)
MCH: 27.1 pg (ref 26.0–34.0)
MCHC: 33.3 g/dL (ref 30.0–36.0)
MCV: 81.3 fL (ref 80.0–100.0)
Platelets: 372 10*3/uL (ref 150–400)
RBC: 3.58 MIL/uL — ABNORMAL LOW (ref 3.87–5.11)
RDW: 15.2 % (ref 11.5–15.5)
WBC: 23.8 10*3/uL — ABNORMAL HIGH (ref 4.0–10.5)
nRBC: 0 % (ref 0.0–0.2)

## 2021-12-05 MED ORDER — ALBUTEROL SULFATE HFA 108 (90 BASE) MCG/ACT IN AERS: 2.0000 | INHALATION_SPRAY | Freq: Four times a day (QID) | RESPIRATORY_TRACT | Status: DC | PRN

## 2021-12-05 MED ORDER — METOPROLOL SUCCINATE ER 25 MG PO TB24: 12.5000 mg | ORAL_TABLET | Freq: Every day | ORAL | Status: DC

## 2021-12-05 NOTE — Progress Notes (Addendum)
1 Day Post-Op  Subjective: Pain controlled, denies nausea/vomiting. Good UOP.    Objective: Vital signs in last 24 hours: Temp:  [97.7 F (36.5 C)-99.1 F (37.3 C)] 98.9 F (37.2 C) (09/09 0757) Pulse Rate:  [112-144] 122 (09/09 0757) Resp:  [16-27] 20 (09/09 0823) BP: (102-131)/(55-88) 104/55 (09/09 0757) SpO2:  [91 %-100 %] 95 % (09/09 0823) Last BM Date :  (pta)  Intake/Output from previous day: 09/08 0701 - 09/09 0700 In: 1750 [I.V.:1400; IV Piggyback:350] Out: 3550 [Urine:1150; Emesis/NG output:200; Drains:180; Blood:20] Intake/Output this shift: Total I/O In: -  Out: 500 [Urine:500]  PE: General: resting comfortably, NAD Neuro: alert and oriented, no focal deficits HEENT: NG draining gastric contents Resp: normal work of breathing Abdomen: soft, nondistended, appropriately tender. Midline incision is open at skin, wound bed clean and dry. JP with serosanguinous drainage. Extremities: warm and well-perfused   Lab Results:  Recent Labs    12/03/21 2200 12/05/21 0034  WBC 16.1* 23.8*  HGB 12.7 9.7*  HCT 40.4 29.1*  PLT 567* 372   BMET Recent Labs    12/03/21 2200 12/05/21 0034  NA 138 135  K 3.4* 3.9  CL 104 104  CO2 19* 23  GLUCOSE 122* 142*  BUN 12 15  CREATININE 0.78 0.91  CALCIUM 9.7 7.6*   PT/INR No results for input(s): "LABPROT", "INR" in the last 72 hours. CMP     Component Value Date/Time   NA 135 12/05/2021 0034   NA 140 07/08/2020 1120   K 3.9 12/05/2021 0034   CL 104 12/05/2021 0034   CO2 23 12/05/2021 0034   GLUCOSE 142 (H) 12/05/2021 0034   BUN 15 12/05/2021 0034   BUN 11 07/08/2020 1120   CREATININE 0.91 12/05/2021 0034   CALCIUM 7.6 (L) 12/05/2021 0034   PROT 4.7 (L) 12/05/2021 0034   PROT 6.8 04/09/2020 1113   ALBUMIN 2.1 (L) 12/05/2021 0034   ALBUMIN 4.5 04/09/2020 1113   AST 70 (H) 12/05/2021 0034   ALT 26 12/05/2021 0034   ALKPHOS 49 12/05/2021 0034   BILITOT 1.0 12/05/2021 0034   BILITOT 0.3 04/09/2020  1113   GFRNONAA >60 12/05/2021 0034   GFRAA 117 04/09/2020 1113   Lipase     Component Value Date/Time   LIPASE 22 12/03/2021 2200       Studies/Results: ECHOCARDIOGRAM COMPLETE  Result Date: 12/04/2021    ECHOCARDIOGRAM REPORT   Patient Name:   Kimberly Terrell Date of Exam: 12/04/2021 Medical Rec #:  [id number]         Height:       63.0 in Accession #:    [id number]        Weight:       145.0 lb Date of Birth:  03-21-1976         BSA:          1.687 m Patient Age:    46 years          BP:           110/88 mmHg Patient Gender: F                 HR:           140 bpm. Exam Location:  Inpatient Procedure: 2D Echo, Color Doppler, Cardiac Doppler and Intracardiac            Opacification Agent STAT ECHO REPORT CONTAINS CRITICAL RESULT Indications:     R07.9* Chest pain, unspecified  History:  Patient has prior history of Echocardiogram examinations, most                  recent 02/04/2020. Abnormal ECG; Signs/Symptoms:Chest Pain.  Sonographer:     Sheralyn Boatman RDCS Referring Phys:  Wendall Stade Diagnosing Phys: Charlton Haws MD  Sonographer Comments: Technically difficult study due to poor echo windows. IMPRESSIONS  1. Distal septal/ apical/ inferior basal hypokinesis No obvious mural thrombus but would consider repeating TTE after emergent surgery for perforated gastric ulcer when HR lower and images better . Left ventricular ejection fraction, by estimation, is 40 to 45%. The left ventricle has mildly decreased function. The left ventricle has no regional wall motion abnormalities. Left ventricular diastolic parameters are indeterminate.  2. Right ventricular systolic function is mildly reduced. The right ventricular size is mildly enlarged. There is normal pulmonary artery systolic pressure.  3. The mitral valve is normal in structure. No evidence of mitral valve regurgitation. No evidence of mitral stenosis.  4. The aortic valve is tricuspid. Aortic valve regurgitation is not visualized. No aortic  stenosis is present.  5. The inferior vena cava is normal in size with greater than 50% respiratory variability, suggesting right atrial pressure of 3 mmHg. FINDINGS  Left Ventricle: Distal septal/ apical/ inferior basal hypokinesis No obvious mural thrombus but would consider repeating TTE after emergent surgery for perforated gastric ulcer when HR lower and images better. Left ventricular ejection fraction, by estimation, is 40 to 45%. The left ventricle has mildly decreased function. The left ventricle has no regional wall motion abnormalities. Definity contrast agent was given IV to delineate the left ventricular endocardial borders. The left ventricular internal cavity size was normal in size. There is no left ventricular hypertrophy. Left ventricular diastolic parameters are indeterminate. Right Ventricle: The right ventricular size is mildly enlarged. Right vetricular wall thickness was not assessed. Right ventricular systolic function is mildly reduced. There is normal pulmonary artery systolic pressure. The tricuspid regurgitant velocity is 2.01 m/s, and with an assumed right atrial pressure of 3 mmHg, the estimated right ventricular systolic pressure is 19.2 mmHg. Left Atrium: Left atrial size was normal in size. Right Atrium: Right atrial size was normal in size. Pericardium: There is no evidence of pericardial effusion. Mitral Valve: The mitral valve is normal in structure. No evidence of mitral valve regurgitation. No evidence of mitral valve stenosis. Tricuspid Valve: The tricuspid valve is normal in structure. Tricuspid valve regurgitation is mild . No evidence of tricuspid stenosis. Aortic Valve: The aortic valve is tricuspid. Aortic valve regurgitation is not visualized. No aortic stenosis is present. Pulmonic Valve: The pulmonic valve was normal in structure. Pulmonic valve regurgitation is not visualized. No evidence of pulmonic stenosis. Aorta: The aortic root is normal in size and structure.  Venous: The inferior vena cava is normal in size with greater than 50% respiratory variability, suggesting right atrial pressure of 3 mmHg. IAS/Shunts: No atrial level shunt detected by color flow Doppler.  LEFT VENTRICLE PLAX 2D LVIDd:         3.50 cm LVIDs:         2.80 cm LV PW:         1.00 cm LV IVS:        1.10 cm LVOT diam:     2.30 cm LV SV:         29 LV SV Index:   17 LVOT Area:     4.15 cm  LV Volumes (MOD) LV vol d, MOD A2C:  77.1 ml LV vol d, MOD A4C: 76.8 ml LV vol s, MOD A2C: 36.6 ml LV vol s, MOD A4C: 48.9 ml LV SV MOD A2C:     40.5 ml LV SV MOD A4C:     76.8 ml LV SV MOD BP:      36.9 ml RIGHT VENTRICLE             IVC RV S prime:     15.30 cm/s  IVC diam: 1.20 cm TAPSE (M-mode): 1.3 cm LEFT ATRIUM             Index        RIGHT ATRIUM          Index LA diam:        2.50 cm 1.48 cm/m   RA Area:     9.73 cm LA Vol (A2C):   19.1 ml 11.32 ml/m  RA Volume:   20.40 ml 12.10 ml/m LA Vol (A4C):   23.0 ml 13.64 ml/m LA Biplane Vol: 23.1 ml 13.70 ml/m  AORTIC VALVE LVOT Vmax:   70.20 cm/s LVOT Vmean:  46.000 cm/s LVOT VTI:    0.070 m  AORTA Ao Root diam: 2.80 cm Ao Asc diam:  2.50 cm MITRAL VALVE               TRICUSPID VALVE MV Area (PHT): 10.25 cm   TR Peak grad:   16.2 mmHg MV Decel Time: 74 msec     TR Vmax:        201.00 cm/s MV E velocity: 47.40 cm/s MV A velocity: 79.90 cm/s  SHUNTS MV E/A ratio:  0.59        Systemic VTI:  0.07 m                            Systemic Diam: 2.30 cm Charlton Haws MD Electronically signed by Charlton Haws MD Signature Date/Time: 12/04/2021/8:57:13 AM    Final    CT Angio Chest/Abd/Pel for Dissection W and/or Wo Contrast  Result Date: 12/04/2021 CLINICAL DATA:  Diffuse abdominal pain. Vomiting. History of H pylori. EXAM: CT ANGIOGRAPHY CHEST, ABDOMEN AND PELVIS TECHNIQUE: Non-contrast CT of the chest was initially obtained. Multidetector CT imaging through the chest, abdomen and pelvis was performed using the standard protocol during bolus administration of  intravenous contrast. Multiplanar reconstructed images and MIPs were obtained and reviewed to evaluate the vascular anatomy. RADIATION DOSE REDUCTION: This exam was performed according to the departmental dose-optimization program which includes automated exposure control, adjustment of the mA and/or kV according to patient size and/or use of iterative reconstruction technique. CONTRAST:  OMNIPAQUE IOHEXOL 350 MG/ML SOLN COMPARISON:  Abdomen and pelvis CT 07/04/2019 FINDINGS: CTA CHEST FINDINGS Cardiovascular: Pre contrast imaging shows no hyperdense crescent in the wall of the thoracic aorta to suggest the presence of an acute intramural hematoma. Imaging after IV contrast administration shows no thoracic aortic aneurysm. Ascending thoracic aorta measures 2.9 cm diameter. Mid descending thoracic aorta measures 1.9 cm diameter. No evidence for thoracic aortic dissection on this non gated study. Arterial arch vessel anatomy is widely patent. The heart size is normal. No substantial pericardial effusion. Mediastinum/Nodes: 9 mm short axis subcarinal lymph node on 76/6 is upper normal for size. Mild circumferential wall thickening noted mid esophagus (image 80/6) There is no hilar lymphadenopathy. There is no axillary lymphadenopathy. Lungs/Pleura: Mild centrilobular emphysema noted secretions are noted in the right mainstem bronchus and bronchus intermedius. Subtle  tree-in-bud nodularity noted anterior right lower lobe on image 90/8. 4 mm left upper lobe nodule identified on 47/8. Small focus of tree-in-bud nodularity noted posterior left lower lobe on 91/8. Mild circumferential bronchial wall thickening noted both lower lobes with scattered areas of peripheral small airway impaction bilaterally. Musculoskeletal: No worrisome lytic or sclerotic osseous abnormality. Review of the MIP images confirms the above findings. CTA ABDOMEN AND PELVIS FINDINGS VASCULAR Aorta: Normal caliber aorta without aneurysm,  dissection, vasculitis or significant stenosis. Celiac: Patent without evidence of aneurysm, dissection, vasculitis or significant stenosis. SMA: Patent without evidence of aneurysm, dissection, vasculitis or significant stenosis. Renals: Both renal arteries are patent without evidence of aneurysm, dissection, vasculitis, fibromuscular dysplasia or significant stenosis. IMA: Patent without evidence of aneurysm, dissection, vasculitis or significant stenosis. Inflow: Patent without evidence of aneurysm, dissection, vasculitis or significant stenosis. Veins: No obvious venous abnormality within the limitations of this arterial phase study. Review of the MIP images confirms the above findings. NON-VASCULAR Hepatobiliary: No suspicious focal abnormality within the liver parenchyma. There is no evidence for gallstones, gallbladder wall thickening, or pericholecystic fluid. No intrahepatic or extrahepatic biliary dilation. Pancreas: No focal mass lesion. No dilatation of the main duct. No intraparenchymal cyst. No peripancreatic edema. Spleen: No splenomegaly. No focal mass lesion. Adrenals/Urinary Tract: No adrenal nodule or mass. Right kidney unremarkable. Possible areas of segmental hypoperfusion in the left kidney including anterior upper pole on 151/6, posterior interpolar region on 160/6, and lower pole on 179/6. No evidence for hydroureter. The urinary bladder appears normal for the degree of distention. Stomach/Bowel: Stomach is diffusely thick walled and appears edematous. Extraluminal gas is identified anterior to the proximal stomach on image 153/6. Tiny extraluminal gas bubbles are seen adjacent to the proximal stomach on 142/6. Focal areas of gastric wall thinning are seen anteriorly in the body of the stomach and posteriorly towards the antral region, but no definite or discrete ulcer can be identified. Duodenal wall is ill-defined. No small bowel wall thickening. No small bowel dilatation. The terminal  ileum is normal. The appendix is not well visualized, but there is no edema or inflammation in the region of the cecum. Colon is diffusely decompressed. Lymphatic: Scattered small lymph nodes are seen in the hepatoduodenal in gastrohepatic ligament. No retroperitoneal lymphadenopathy. Soft tissue attenuation anterior to the aorta on image 181/6 appears to represent small bowel on coronal imaging and is similar to the 2021 exam. No pelvic sidewall lymphadenopathy. Reproductive: The uterus is unremarkable.  There is no adnexal mass. Other: Moderate to large volume ascites evident with fluid around the liver and spleen. Average attenuation the fluid is relatively low, making hemorrhage. Edema/fluid is seen in the gastrocolic ligament with fluid in both pericolic gutters and in the pelvis. No substantial peritoneal thickening or hyperenhancement. There is a question of a very subtle nodule along the peritoneum of the right pelvis (see image 277/6). Musculoskeletal: No worrisome lytic or sclerotic osseous abnormality. Review of the MIP images confirms the above findings. IMPRESSION: 1. No evidence for thoracic or abdominal aortic aneurysm or dissection. 2. Circumferential wall thickening in the mid esophagus suggests esophagitis. 3. Focal areas of tree-in-bud nodularity in both lungs suggest sequelae of atypical infection, including MAI. 4. Marked thickening and edema of the gastric wall with tiny extraluminal gas bubbles adjacent to the proximal stomach. While no definite or discrete ulcer can be identified, there are areas of gastric wall thinning anteriorly in the body of the stomach and posteriorly towards the antral region. Given the  history of H pylori, perforated gastric ulcer would be a consideration for the intraperitoneal free air. 5. Moderate to large volume ascites with fluid around the liver and spleen. Average attenuation the fluid is relatively low, not favoring hemorrhage. 6. Possible areas of segmental  hypoperfusion in the left kidney, raising the possibility of pyelonephritis. 7. There is a question of a very subtle nodule along the peritoneum of the right pelvis. After resolution of patient's acute symptoms, follow-up standard abdomen and pelvis CT with oral and intravenous contrast recommended to further evaluate. 8. 4 mm left upper lobe pulmonary nodule. No follow-up needed if patient is low-risk.This recommendation follows the consensus statement: Guidelines for Management of Incidental Pulmonary Nodules Detected on CT Images: From the Fleischner Society 2017; Radiology 2017; 284:228-243. 9. Emphysema (ICD10-J43.9). Critical Value/emergent results were called by telephone at the time of interpretation on 12/04/2021 at 5:52 am to provider [facility] , who verbally acknowledged these results. Electronically Signed   By: Kennith Center M.D.   On: 12/04/2021 05:53   DG Chest 2 View  Result Date: 12/04/2021 CLINICAL DATA:  Chest pain EXAM: CHEST - 2 VIEW COMPARISON:  12/26/2019 FINDINGS: Lungs are clear.  No pleural effusion or pneumothorax. The heart is normal in size. Visualized osseous structures are within normal limits. IMPRESSION: Normal chest radiographs. Electronically Signed   By: Charline Bills M.D.   On: 12/04/2021 00:55    Anti-infectives: Anti-infectives (From admission, onward)    Start     Dose/Rate Route Frequency Ordered Stop   12/04/21 1445  piperacillin-tazobactam (ZOSYN) IVPB 3.375 g  Status:  Discontinued        3.375 g 12.5 mL/hr over 240 Minutes Intravenous Every 8 hours 12/04/21 1357 12/04/21 1434   12/04/21 1200  piperacillin-tazobactam (ZOSYN) IVPB 3.375 g        3.375 g 12.5 mL/hr over 240 Minutes Intravenous Every 8 hours 12/04/21 1008 12/09/21 1159   12/04/21 0530  piperacillin-tazobactam (ZOSYN) IVPB 3.375 g        3.375 g 100 mL/hr over 30 Minutes Intravenous  Once 12/04/21 0517 12/04/21 0704        Assessment/Plan 46 yo female with perforated gastric ulcer,  POD1 s/p exploratory laparotomy with biopsy of gastric ulcer and Dorene Ar. - Surgical pathology pending - Continue NG tube to LIS to minimize gastric distension. Ok for small amounts of ice chips but patient should otherwise be strict NPO. NG is to remain for a minimum of 3 days. - Keep JP in place - Continue antibiotics for 5 days postop - Cardiology following for elevated troponins, thought to be demand ischemia. - Remove foley, get patient out of bed to commode - Ambulate, PT ordered - VTE: Lovenox, SCDs - Dispo: inpatient, med-surg floor, surgery will follow closely    LOS: 1 day    Sophronia Simas, MD [facility] General, Hepatobiliary and Pancreatic Surgery 12/05/21 10:23 AM

## 2021-12-05 NOTE — Progress Notes (Signed)
Patient refused foley catheter removal. 

## 2021-12-05 NOTE — Progress Notes (Signed)
Subjective:  Overnight events: None  Pt awake and resting in bed. States she is having some abdominal pain from her procedure, but pain is tolerable with hydromorphone. Denies any chest pain. States she is slightly SOB, but feels its from her asthma.  Discussed abnormal troponins.  Discussed plan to follow-up with cardiology and surgery recommendations today.  Objective:  Vital signs in last 24 hours: Vitals:   12/05/21 0008 12/05/21 0046 12/05/21 0430 12/05/21 0449  BP: 105/61  (!) 113/56   Pulse: (!) 112  (!) 115   Resp: 18 (!) 22 18 (!) 22  Temp: 98.8 F (37.1 C)  98.6 F (37 C)   TempSrc: Oral  Oral   SpO2: 97% 100% 97% 98%  Weight:      Height:       Weight change:   Intake/Output Summary (Last 24 hours) at 12/05/2021 0755 Last data filed at 12/05/2021 0600 Gross per 24 hour  Intake 1750 ml  Output 3550 ml  Net -1800 ml   Physical Exam General: NAD. Well-developed, well-nourished Head: Normocephalic and atraumatic.  Cardiovascular: Tachycardic, regular rhythm, intact distal pulses. No murmurs, rubs, or gallops. No lower extremity edema  Pulmonary: Non labored breathing, no wheezing or rales      Neurological: Alert and oriented to person, place, and time. Non focal  Skin: Skin is warm and dry.   Assessment/Plan:  Principal Problem:   Perforated viscus Active Problems:   Gastric ulcer  [name] is 46 yo with a PMH of anxiety, arthritis, asthma (well controlled), h/o H pylori and PUD who presents with abdominal pain and was admitted for perforated gastric ulcer   #Perforated viscus  Pt presented with acutely worsening abdominal pain yesterday found to have perforated ulcer of the posterior gastric wall on CT. She had exploratory laparotomy on 9/8 with biopsy of the gastric ulcer and Dorene Ar. Surgery recommends small amounts of ice chips but patient should otherwise be strict NPO w/ NG to remain for a minimum of 3 days. Has been tolerating pain  well with hydromorphone PRN. Surgery also recommends keeping JP in place. Leukocytosis present today. Remains afebrile. Hgb stable at 9.7 today.  - IV zosyn day 2/5 - IV protonix  - Dilaudid PCA pump for pain control   #Chest pain #Mildly elevated troponins and diffuse ST elevations Pt denies chest pain today.Troponin appears to have peaked at 10,518. Most recent trop 9799. Echo on 9/8 revealed LV dysfunction with EF 40-45%, RV enlargement, and mild tricuspid regurgitation. Consulted cardiology yesterday and states EKG consistent with diffuse pericarditis as opposed to ischemic pathology. Cards suspects troponin elevation to be secondary to tachycardia and peritonitis associated w/ perforated peptic ulcer. Cards suspects stress induce CM but cannot exclude ischemic event. State she is poor cath candidate given her recent surgery and prefer medical management. Cardiology  recommends starting heparin tomorrow w/ surgical clearance if trop continues to rise and Hgb stable. Also recommend holding antiplatelets. Recommends toprol 12.5 mg when surgically cleared for PO.   #Asthma v Emphysema # Incidental Pulmonary Nodule Patient is a long time smoker. Reports she was previously diagnosed with asthma. She has albuterol inhaler which she uses sparingly. 4 mm left upper lobe pulmonary nodule noted on CT.  - Continue albuterol  - Recommend PFTs as outpatient - F/u with primary care for surveillance of pulmonary nodule  #Arthritis  Chronic history. - Recommend outpatient rheumatology f/u - Avoiding NSAIDs due to perforated ulcer - Voltaren gel  #Tobacco  Use  - Consult TOC on smoking cessation - Nicotine patch   Diet: NPO Bowel: none VTE: Lovenox IVF: Dextrose 5%-0.9% NaCl Code: Full code  Prior to Admission Living Arrangement: home w/ grandchildren Anticipated Discharge Location: home w/ grandchildren Barriers to Discharge: continued management Dispo: Anticipated discharge in approximately  more than 2 day(s).    Kimberly Terrell, Medical Student 12/05/2021, 7:55 AM   Attestation for Student Documentation:  Terrell personally was present and performed or re-performed the history, physical exam and medical decision-making activities of this service and have verified that the service and findings are accurately documented in the student's note.  Karoline Caldwell, MD 12/05/2021, 3:39 PM

## 2021-12-05 NOTE — Evaluation (Signed)
Physical Therapy Evaluation Patient Details Name: Kimberly Terrell. Frances MRN: [id number] DOB: [birth year] Today's Date: 12/05/2021  History of Present Illness  46 y/o female presented to ED on 12/03/21 for epigastric pain and N/V for 1.5 days. S/p ex lap for perforated posterior wall stomach ulcer and peritonitis on 9/8. PMH: gastric ulcers, H. pylori infection, anxiety  Clinical Impression  Patient admitted with above. PTA, patient lives with husband and cares for her grandchildren. Patient presents with weakness, impaired balance, and decreased activity tolerance. Patient required minA to stand from EOB and performed marching at EOB with drop in spO2 to 87% on 3L O2 [place]. Educated patient on log roll technique for OOB. Patient will benefit from skilled PT services during acute stay to address listed deficits. Anticipate no PT follow up at discharge once pain is more managed.        Recommendations for follow up therapy are one component of a multi-disciplinary discharge planning process, led by the attending physician.  Recommendations may be updated based on patient status, additional functional criteria and insurance authorization.  Follow Up Recommendations No PT follow up      Assistance Recommended at Discharge Intermittent Supervision/Assistance  Patient can return home with the following       Equipment Recommendations None recommended by PT  Recommendations for Other Services       Functional Status Assessment Patient has had a recent decline in their functional status and demonstrates the ability to make significant improvements in function in a reasonable and predictable amount of time.     Precautions / Restrictions Precautions Precautions: Fall Precaution Comments: watch HR and O2; NG tube on suction, abdominal incision, JP drain on R side Restrictions Weight Bearing Restrictions: No      Mobility  Bed Mobility Overal bed mobility: Needs Assistance Bed Mobility: Rolling,  Sidelying to Sit, Sit to Sidelying Rolling: Supervision Sidelying to sit: Supervision     Sit to sidelying: Supervision General bed mobility comments: instructed on log roll technique to minimize pain and stress on abdominal incision    Transfers Overall transfer level: Needs assistance Equipment used: 1 person hand held assist Transfers: Sit to/from Stand Sit to Stand: Min assist           General transfer comment: assist to steady    Ambulation/Gait             Pre-gait activities: standing marching with min guard with spO2 dropping to 87% on 3L O2 [place]    Stairs            Wheelchair Mobility    Modified Rankin (Stroke Patients Only)       Balance Overall balance assessment: Mild deficits observed, not formally tested                                           Pertinent Vitals/Pain Pain Assessment Pain Assessment: Faces Faces Pain Scale: Hurts whole lot Pain Location: abdomen Pain Descriptors / Indicators: Discomfort, Grimacing, Guarding Pain Intervention(s): Monitored during session, PCA encouraged    Home Living Family/patient expects to be discharged to:: Private residence Living Arrangements: Spouse/significant other;Children;Other (Comment) (2 grandchildren) Available Help at Discharge: Family Type of Home: House Home Access: Stairs to enter Entrance Stairs-Rails: Left Entrance Stairs-Number of Steps: 3-4   Home Layout: One level        Prior Function Prior Level of Function : Independent/Modified  Independent                     Hand Dominance        Extremity/Trunk Assessment   Upper Extremity Assessment Upper Extremity Assessment: Generalized weakness    Lower Extremity Assessment Lower Extremity Assessment: Generalized weakness    Cervical / Trunk Assessment Cervical / Trunk Assessment: Other exceptions Cervical / Trunk Exceptions: abdominal incision  Communication   Communication: No  difficulties  Cognition Arousal/Alertness: Awake/alert Behavior During Therapy: WFL for tasks assessed/performed Overall Cognitive Status: Within Functional Limits for tasks assessed                                          General Comments General comments (skin integrity, edema, etc.): HR up to upper 130's with spO2 drop to 87% on 3L O2    Exercises     Assessment/Plan    PT Assessment Patient needs continued PT services  PT Problem List Decreased strength;Decreased activity tolerance;Decreased balance;Decreased mobility;Cardiopulmonary status limiting activity       PT Treatment Interventions DME instruction;Gait training;Functional mobility training;Therapeutic activities;Therapeutic exercise;Balance training;Patient/family education    PT Goals (Current goals can be found in the Care Plan section)  Acute Rehab PT Goals Patient Stated Goal: to get better PT Goal Formulation: With patient Time For Goal Achievement: 12/19/21 Potential to Achieve Goals: Good    Frequency Min 3X/week     Co-evaluation               AM-PAC PT "6 Clicks" Mobility  Outcome Measure Help needed turning from your back to your side while in a flat bed without using bedrails?: A Little Help needed moving from lying on your back to sitting on the side of a flat bed without using bedrails?: A Little Help needed moving to and from a bed to a chair (including a wheelchair)?: A Little Help needed standing up from a chair using your arms (e.g., wheelchair or bedside chair)?: A Little Help needed to walk in hospital room?: A Little Help needed climbing 3-5 steps with a railing? : A Little 6 Click Score: 18    End of Session   Activity Tolerance: Patient tolerated treatment well Patient left: in bed;with call bell/phone within reach;with family/visitor present Nurse Communication: Mobility status PT Visit Diagnosis: Unsteadiness on feet (R26.81);Muscle weakness (generalized)  (M62.81)    Time: 4268-3419 PT Time Calculation (min) (ACUTE ONLY): 31 min   Charges:   PT Evaluation $PT Eval Low Complexity: 1 Low PT Treatments $Therapeutic Activity: 8-22 mins        Morrigan Wickens A. Dan Humphreys PT, DPT Acute Rehabilitation Services Office [phone]   Viviann Spare 12/05/2021, 4:35 PM

## 2021-12-05 NOTE — Progress Notes (Signed)
Rounding Note    Patient Name: Kimberly Terrell. [name] Date of Encounter: 12/05/2021  [facility] Cardiologist: New  Subjective   No chest pain  Inpatient Medications    Scheduled Meds:  diclofenac Sodium  2 g Topical QID   enoxaparin (LOVENOX) injection  40 mg Subcutaneous Q24H   HYDROmorphone   Intravenous Q4H   nicotine  14 mg Transdermal Daily   pantoprazole (PROTONIX) IV  40 mg Intravenous Q12H   Continuous Infusions:  dextrose 5 % and 0.9% NaCl 125 mL/hr at 12/05/21 0641   piperacillin-tazobactam 3.375 g (12/05/21 0454)   PRN Meds: albuterol, diphenhydrAMINE **OR** diphenhydrAMINE, metoprolol tartrate, naloxone **AND** sodium chloride flush, ondansetron **OR** ondansetron (ZOFRAN) IV, phenol, zolpidem   Vital Signs    Vitals:   12/05/21 0046 12/05/21 0430 12/05/21 0449 12/05/21 0757  BP:  (!) 113/56  (!) 104/55  Pulse:  (!) 115  (!) 122  Resp: (!) 22 18 (!) 22 16  Temp:  98.6 F (37 C)  98.9 F (37.2 C)  TempSrc:  Oral  Oral  SpO2: 100% 97% 98% 96%  Weight:      Height:        Intake/Output Summary (Last 24 hours) at 12/05/2021 0817 Last data filed at 12/05/2021 0600 Gross per 24 hour  Intake 1750 ml  Output 3550 ml  Net -1800 ml      12/04/2021   10:20 AM 09/23/2021    1:32 PM 10/22/2020   11:55 AM  Last 3 Weights  Weight (lbs) 140 lb 145 lb 156 lb  Weight (kg) 63.504 kg 65.772 kg 70.761 kg      Telemetry    Sinus tach - Personally Reviewed  ECG    N/a - Personally Reviewed  Physical Exam   GEN: No acute distress.   Neck: No JVD Cardiac: RRR, no murmurs, rubs, or gallops.  Respiratory: Clear to auscultation bilaterally. GI: Soft, nontender, non-distended  MS: No edema; No deformity. Neuro:  Nonfocal  Psych: Normal affect   Labs    High Sensitivity Troponin:   Recent Labs  Lab 12/04/21 0257 12/04/21 1810 12/04/21 1931 12/04/21 2155 12/05/21 0034  TROPONINIHS 181* 5,753* 6,579* 7,608* 9,474*     Chemistry Recent Labs   Lab 12/03/21 2200 12/05/21 0034  NA 138 135  K 3.4* 3.9  CL 104 104  CO2 19* 23  GLUCOSE 122* 142*  BUN 12 15  CREATININE 0.78 0.91  CALCIUM 9.7 7.6*  PROT 6.2* 4.7*  ALBUMIN 3.3* 2.1*  AST 21 70*  ALT 18 26  ALKPHOS 93 49  BILITOT 1.1 1.0  GFRNONAA >60 >60  ANIONGAP 15 8    Lipids No results for input(s): "CHOL", "TRIG", "HDL", "LABVLDL", "LDLCALC", "CHOLHDL" in the last 168 hours.  Hematology Recent Labs  Lab 12/03/21 2200 12/05/21 0034  WBC 16.1* 23.8*  RBC 4.74 3.58*  HGB 12.7 9.7*  HCT 40.4 29.1*  MCV 85.2 81.3  MCH 26.8 27.1  MCHC 31.4 33.3  RDW 15.3 15.2  PLT 567* 372   Thyroid No results for input(s): "TSH", "FREET4" in the last 168 hours.  BNPNo results for input(s): "BNP", "PROBNP" in the last 168 hours.  DDimer No results for input(s): "DDIMER" in the last 168 hours.   Radiology    ECHOCARDIOGRAM COMPLETE  Result Date: 12/04/2021    ECHOCARDIOGRAM REPORT   Patient Name:   Kimberly Terrell. [name] Date of Exam: 12/04/2021 Medical Rec #:  [id number]  Height:       63.0 in Accession #:    [id number]        Weight:       145.0 lb Date of Birth:  Jul 10, 1975         BSA:          1.687 m Patient Age:    46 years          BP:           110/88 mmHg Patient Gender: F                 HR:           140 bpm. Exam Location:  Inpatient Procedure: 2D Echo, Color Doppler, Cardiac Doppler and Intracardiac            Opacification Agent STAT ECHO REPORT CONTAINS CRITICAL RESULT Indications:     R07.9* Chest pain, unspecified  History:         Patient has prior history of Echocardiogram examinations, most                  recent 02/04/2020. Abnormal ECG; Signs/Symptoms:Chest Pain.  Sonographer:     Sheralyn Boatman RDCS Referring Phys:  Wendall Stade Diagnosing Phys: Charlton Haws MD  Sonographer Comments: Technically difficult study due to poor echo windows. IMPRESSIONS  1. Distal septal/ apical/ inferior basal hypokinesis No obvious mural thrombus but would consider repeating TTE after  emergent surgery for perforated gastric ulcer when HR lower and images better . Left ventricular ejection fraction, by estimation, is 40 to 45%. The left ventricle has mildly decreased function. The left ventricle has no regional wall motion abnormalities. Left ventricular diastolic parameters are indeterminate.  2. Right ventricular systolic function is mildly reduced. The right ventricular size is mildly enlarged. There is normal pulmonary artery systolic pressure.  3. The mitral valve is normal in structure. No evidence of mitral valve regurgitation. No evidence of mitral stenosis.  4. The aortic valve is tricuspid. Aortic valve regurgitation is not visualized. No aortic stenosis is present.  5. The inferior vena cava is normal in size with greater than 50% respiratory variability, suggesting right atrial pressure of 3 mmHg. FINDINGS  Left Ventricle: Distal septal/ apical/ inferior basal hypokinesis No obvious mural thrombus but would consider repeating TTE after emergent surgery for perforated gastric ulcer when HR lower and images better. Left ventricular ejection fraction, by estimation, is 40 to 45%. The left ventricle has mildly decreased function. The left ventricle has no regional wall motion abnormalities. Definity contrast agent was given IV to delineate the left ventricular endocardial borders. The left ventricular internal cavity size was normal in size. There is no left ventricular hypertrophy. Left ventricular diastolic parameters are indeterminate. Right Ventricle: The right ventricular size is mildly enlarged. Right vetricular wall thickness was not assessed. Right ventricular systolic function is mildly reduced. There is normal pulmonary artery systolic pressure. The tricuspid regurgitant velocity is 2.01 m/s, and with an assumed right atrial pressure of 3 mmHg, the estimated right ventricular systolic pressure is 19.2 mmHg. Left Atrium: Left atrial size was normal in size. Right Atrium: Right  atrial size was normal in size. Pericardium: There is no evidence of pericardial effusion. Mitral Valve: The mitral valve is normal in structure. No evidence of mitral valve regurgitation. No evidence of mitral valve stenosis. Tricuspid Valve: The tricuspid valve is normal in structure. Tricuspid valve regurgitation is mild . No evidence of tricuspid stenosis. Aortic Valve: The aortic  valve is tricuspid. Aortic valve regurgitation is not visualized. No aortic stenosis is present. Pulmonic Valve: The pulmonic valve was normal in structure. Pulmonic valve regurgitation is not visualized. No evidence of pulmonic stenosis. Aorta: The aortic root is normal in size and structure. Venous: The inferior vena cava is normal in size with greater than 50% respiratory variability, suggesting right atrial pressure of 3 mmHg. IAS/Shunts: No atrial level shunt detected by color flow Doppler.  LEFT VENTRICLE PLAX 2D LVIDd:         3.50 cm LVIDs:         2.80 cm LV PW:         1.00 cm LV IVS:        1.10 cm LVOT diam:     2.30 cm LV SV:         29 LV SV Index:   17 LVOT Area:     4.15 cm  LV Volumes (MOD) LV vol d, MOD A2C: 77.1 ml LV vol d, MOD A4C: 76.8 ml LV vol s, MOD A2C: 36.6 ml LV vol s, MOD A4C: 48.9 ml LV SV MOD A2C:     40.5 ml LV SV MOD A4C:     76.8 ml LV SV MOD BP:      36.9 ml RIGHT VENTRICLE             IVC RV S prime:     15.30 cm/s  IVC diam: 1.20 cm TAPSE (M-mode): 1.3 cm LEFT ATRIUM             Index        RIGHT ATRIUM          Index LA diam:        2.50 cm 1.48 cm/m   RA Area:     9.73 cm LA Vol (A2C):   19.1 ml 11.32 ml/m  RA Volume:   20.40 ml 12.10 ml/m LA Vol (A4C):   23.0 ml 13.64 ml/m LA Biplane Vol: 23.1 ml 13.70 ml/m  AORTIC VALVE LVOT Vmax:   70.20 cm/s LVOT Vmean:  46.000 cm/s LVOT VTI:    0.070 m  AORTA Ao Root diam: 2.80 cm Ao Asc diam:  2.50 cm MITRAL VALVE               TRICUSPID VALVE MV Area (PHT): 10.25 cm   TR Peak grad:   16.2 mmHg MV Decel Time: 74 msec     TR Vmax:        201.00 cm/s  MV E velocity: 47.40 cm/s MV A velocity: 79.90 cm/s  SHUNTS MV E/A ratio:  0.59        Systemic VTI:  0.07 m                            Systemic Diam: 2.30 cm Charlton Haws MD Electronically signed by Charlton Haws MD Signature Date/Time: 12/04/2021/8:57:13 AM    Final    CT Angio Chest/Abd/Pel for Dissection W and/or Wo Contrast  Result Date: 12/04/2021 CLINICAL DATA:  Diffuse abdominal pain. Vomiting. History of H pylori. EXAM: CT ANGIOGRAPHY CHEST, ABDOMEN AND PELVIS TECHNIQUE: Non-contrast CT of the chest was initially obtained. Multidetector CT imaging through the chest, abdomen and pelvis was performed using the standard protocol during bolus administration of intravenous contrast. Multiplanar reconstructed images and MIPs were obtained and reviewed to evaluate the vascular anatomy. RADIATION DOSE REDUCTION: This exam was performed according to the departmental dose-optimization  program which includes automated exposure control, adjustment of the mA and/or kV according to patient size and/or use of iterative reconstruction technique. CONTRAST:  OMNIPAQUE IOHEXOL 350 MG/ML SOLN COMPARISON:  Abdomen and pelvis CT 07/04/2019 FINDINGS: CTA CHEST FINDINGS Cardiovascular: Pre contrast imaging shows no hyperdense crescent in the wall of the thoracic aorta to suggest the presence of an acute intramural hematoma. Imaging after IV contrast administration shows no thoracic aortic aneurysm. Ascending thoracic aorta measures 2.9 cm diameter. Mid descending thoracic aorta measures 1.9 cm diameter. No evidence for thoracic aortic dissection on this non gated study. Arterial arch vessel anatomy is widely patent. The heart size is normal. No substantial pericardial effusion. Mediastinum/Nodes: 9 mm short axis subcarinal lymph node on 76/6 is upper normal for size. Mild circumferential wall thickening noted mid esophagus (image 80/6) There is no hilar lymphadenopathy. There is no axillary lymphadenopathy. Lungs/Pleura:  Mild centrilobular emphysema noted secretions are noted in the right mainstem bronchus and bronchus intermedius. Subtle tree-in-bud nodularity noted anterior right lower lobe on image 90/8. 4 mm left upper lobe nodule identified on 47/8. Small focus of tree-in-bud nodularity noted posterior left lower lobe on 91/8. Mild circumferential bronchial wall thickening noted both lower lobes with scattered areas of peripheral small airway impaction bilaterally. Musculoskeletal: No worrisome lytic or sclerotic osseous abnormality. Review of the MIP images confirms the above findings. CTA ABDOMEN AND PELVIS FINDINGS VASCULAR Aorta: Normal caliber aorta without aneurysm, dissection, vasculitis or significant stenosis. Celiac: Patent without evidence of aneurysm, dissection, vasculitis or significant stenosis. SMA: Patent without evidence of aneurysm, dissection, vasculitis or significant stenosis. Renals: Both renal arteries are patent without evidence of aneurysm, dissection, vasculitis, fibromuscular dysplasia or significant stenosis. IMA: Patent without evidence of aneurysm, dissection, vasculitis or significant stenosis. Inflow: Patent without evidence of aneurysm, dissection, vasculitis or significant stenosis. Veins: No obvious venous abnormality within the limitations of this arterial phase study. Review of the MIP images confirms the above findings. NON-VASCULAR Hepatobiliary: No suspicious focal abnormality within the liver parenchyma. There is no evidence for gallstones, gallbladder wall thickening, or pericholecystic fluid. No intrahepatic or extrahepatic biliary dilation. Pancreas: No focal mass lesion. No dilatation of the main duct. No intraparenchymal cyst. No peripancreatic edema. Spleen: No splenomegaly. No focal mass lesion. Adrenals/Urinary Tract: No adrenal nodule or mass. Right kidney unremarkable. Possible areas of segmental hypoperfusion in the left kidney including anterior upper pole on 151/6,  posterior interpolar region on 160/6, and lower pole on 179/6. No evidence for hydroureter. The urinary bladder appears normal for the degree of distention. Stomach/Bowel: Stomach is diffusely thick walled and appears edematous. Extraluminal gas is identified anterior to the proximal stomach on image 153/6. Tiny extraluminal gas bubbles are seen adjacent to the proximal stomach on 142/6. Focal areas of gastric wall thinning are seen anteriorly in the body of the stomach and posteriorly towards the antral region, but no definite or discrete ulcer can be identified. Duodenal wall is ill-defined. No small bowel wall thickening. No small bowel dilatation. The terminal ileum is normal. The appendix is not well visualized, but there is no edema or inflammation in the region of the cecum. Colon is diffusely decompressed. Lymphatic: Scattered small lymph nodes are seen in the hepatoduodenal in gastrohepatic ligament. No retroperitoneal lymphadenopathy. Soft tissue attenuation anterior to the aorta on image 181/6 appears to represent small bowel on coronal imaging and is similar to the 2021 exam. No pelvic sidewall lymphadenopathy. Reproductive: The uterus is unremarkable.  There is no adnexal mass. Other: Moderate  to large volume ascites evident with fluid around the liver and spleen. Average attenuation the fluid is relatively low, making hemorrhage. Edema/fluid is seen in the gastrocolic ligament with fluid in both pericolic gutters and in the pelvis. No substantial peritoneal thickening or hyperenhancement. There is a question of a very subtle nodule along the peritoneum of the right pelvis (see image 277/6). Musculoskeletal: No worrisome lytic or sclerotic osseous abnormality. Review of the MIP images confirms the above findings. IMPRESSION: 1. No evidence for thoracic or abdominal aortic aneurysm or dissection. 2. Circumferential wall thickening in the mid esophagus suggests esophagitis. 3. Focal areas of tree-in-bud  nodularity in both lungs suggest sequelae of atypical infection, including MAI. 4. Marked thickening and edema of the gastric wall with tiny extraluminal gas bubbles adjacent to the proximal stomach. While no definite or discrete ulcer can be identified, there are areas of gastric wall thinning anteriorly in the body of the stomach and posteriorly towards the antral region. Given the history of H pylori, perforated gastric ulcer would be a consideration for the intraperitoneal free air. 5. Moderate to large volume ascites with fluid around the liver and spleen. Average attenuation the fluid is relatively low, not favoring hemorrhage. 6. Possible areas of segmental hypoperfusion in the left kidney, raising the possibility of pyelonephritis. 7. There is a question of a very subtle nodule along the peritoneum of the right pelvis. After resolution of patient's acute symptoms, follow-up standard abdomen and pelvis CT with oral and intravenous contrast recommended to further evaluate. 8. 4 mm left upper lobe pulmonary nodule. No follow-up needed if patient is low-risk.This recommendation follows the consensus statement: Guidelines for Management of Incidental Pulmonary Nodules Detected on CT Images: From the Fleischner Society 2017; Radiology 2017; 284:228-243. 9. Emphysema (ICD10-J43.9). Critical Value/emergent results were called by telephone at the time of interpretation on 12/04/2021 at 5:52 am to provider [facility] , who verbally acknowledged these results. Electronically Signed   By: Kennith Center M.D.   On: 12/04/2021 05:53   DG Chest 2 View  Result Date: 12/04/2021 CLINICAL DATA:  Chest pain EXAM: CHEST - 2 VIEW COMPARISON:  12/26/2019 FINDINGS: Lungs are clear.  No pleural effusion or pneumothorax. The heart is normal in size. Visualized osseous structures are within normal limits. IMPRESSION: Normal chest radiographs. Electronically Signed   By: Charline Bills M.D.   On: 12/04/2021 00:55    Cardiac  Studies    Patient Profile     Tineka Uriegas. Marcus is a 46 y.o. female with a hx of anxiety, depression, sinus tachycardia, and H. pylori treated x2 who is being seen today for the evaluation of elevated troponin and ECG changes at the request of emergency department.  Assessment & Plan    1.Elevated troponin - in setting of tachycardia, peritonitis with suspected perforated peptic ulcer - trop up to 9474 - initial EKG sinus tach 160, subsequent SR ST elevation II,III,aVF, V4-V6 -follow up EKG resolving ST/T changes - echo LVEF 40-45%, mild RV dysfunction. Distal septal/ apical/ inferior basal hypokinesis   - from initial evaluation thought to be global ischemia/demand from severe systemic illness, was not a cath candidate given perforated bowel with emergent surgery - suspect possible stress induced CM but cannot exclude an ischemic event -poor cath candidate given recent surgery, high risk to commit to DAPT given recent perforated ulcer. I think will have to manage medically alone.  - heparinize perhaps tomorrow when ok from surgical standpoint,if ongoing trop elevation, and if Hgb stable. Hold  on antiplatel agent given perforation and recent surgery.  - soft bp's limited medical therapy for systolic dysfunction, also currently with NG tube to suction. Would start toprol 12.5mg  when able, contact primary team about when can start PO pills     2. Peritonitis with perforated peptic ulcer - ex lap 12/04/21, Perforated distal body posterior wall stomach ulcer with 1.5 L of gastric contents abdominal cavity and peritonitis - s/p closure of posterior gastric wall ulcer       For questions or updates, please contact [facility] Please consult [email or web address] for contact info under        Signed, Dina Rich, MD  12/05/2021, 8:17 AM

## 2021-12-06 ENCOUNTER — Inpatient Hospital Stay (HOSPITAL_COMMUNITY)

## 2021-12-06 DIAGNOSIS — R778 Other specified abnormalities of plasma proteins: Secondary | ICD-10-CM | POA: Diagnosis not present

## 2021-12-06 DIAGNOSIS — K631 Perforation of intestine (nontraumatic): Secondary | ICD-10-CM | POA: Diagnosis not present

## 2021-12-06 LAB — BASIC METABOLIC PANEL
Anion gap: 6 (ref 5–15)
BUN: 7 mg/dL (ref 6–20)
CO2: 25 mmol/L (ref 22–32)
Calcium: 7.5 mg/dL — ABNORMAL LOW (ref 8.9–10.3)
Chloride: 105 mmol/L (ref 98–111)
Creatinine, Ser: 0.69 mg/dL (ref 0.44–1.00)
GFR, Estimated: >60 mL/min (ref >60)
Glucose, Bld: 110 mg/dL — ABNORMAL HIGH (ref 70–99)
Potassium: 3.4 mmol/L — ABNORMAL LOW (ref 3.5–5.1)
Sodium: 136 mmol/L (ref 135–145)

## 2021-12-06 LAB — TROPONIN I (HIGH SENSITIVITY): Troponin I (High Sensitivity): 6061 ng/L (ref <18)

## 2021-12-06 LAB — CBC
HCT: 25.8 % — ABNORMAL LOW (ref 36.0–46.0)
Hemoglobin: 8.5 g/dL — ABNORMAL LOW (ref 12.0–15.0)
MCH: 27.3 pg (ref 26.0–34.0)
MCHC: 32.9 g/dL (ref 30.0–36.0)
MCV: 83 fL (ref 80.0–100.0)
Platelets: 365 10*3/uL (ref 150–400)
RBC: 3.11 MIL/uL — ABNORMAL LOW (ref 3.87–5.11)
RDW: 15.4 % (ref 11.5–15.5)
WBC: 23.1 10*3/uL — ABNORMAL HIGH (ref 4.0–10.5)
nRBC: 0 % (ref 0.0–0.2)

## 2021-12-06 MED ORDER — ACETAMINOPHEN 325 MG PO TABS
650.0000 mg | ORAL_TABLET | Freq: Four times a day (QID) | ORAL | Status: DC | PRN
  Administered 2021-12-06: 650 mg via ORAL
  Filled 2021-12-06: qty 2

## 2021-12-06 MED ORDER — POTASSIUM CHLORIDE 10 MEQ/100ML IV SOLN: 10.0000 meq | INTRAVENOUS | Status: DC

## 2021-12-06 MED ORDER — FUROSEMIDE 10 MG/ML IJ SOLN
20.0000 mg | Freq: Once | INTRAMUSCULAR | Status: AC
  Administered 2021-12-06: 20 mg via INTRAVENOUS
  Filled 2021-12-06: qty 2

## 2021-12-06 MED ORDER — POTASSIUM CHLORIDE 10 MEQ/100ML IV SOLN
10.0000 meq | INTRAVENOUS | Status: AC
  Administered 2021-12-06 (×6): 10 meq via INTRAVENOUS
  Filled 2021-12-06 (×5): qty 100

## 2021-12-06 NOTE — Progress Notes (Signed)
   12/06/21 2121  Assess: MEWS Score  Temp (!) 101.1 F (38.4 C)  Resp (!) 26  SpO2 93 %  O2 Device Nasal Cannula  O2 Flow Rate (L/min) 4.5 L/min  FiO2 (%) 35 %  Assess: MEWS Score  MEWS Temp 1  MEWS Systolic 0  MEWS Pulse 2  MEWS RR 2  MEWS LOC 0  MEWS Score 5  MEWS Score Color Red  Assess: if the MEWS score is Yellow or Red  Were vital signs taken at a resting state? Yes  Focused Assessment Change from prior assessment (see assessment flowsheet)  Does the patient meet 2 or more of the SIRS criteria? Yes  Does the patient have a confirmed or suspected source of infection? No  Provider and Rapid Response Notified? No  MEWS guidelines implemented *See Row Information* Yes  Treat  Pain Scale 0-10  Pain Score 6  Pain Type Surgical pain  Pain Location Abdomen  Take Vital Signs  Increase Vital Sign Frequency  Red: Q 1hr X 4 then Q 4hr X 4, if remains red, continue Q 4hrs  Escalate  MEWS: Escalate Red: discuss with charge nurse/RN and provider, consider discussing with RRT  Notify: Charge Nurse/RN  Name of Charge Nurse/RN Notified Christina RN  Date Charge Nurse/RN Notified 12/06/21  Time Charge Nurse/RN Notified 2214  Notify: Provider  Provider Name/Title Austin Miles MD  Date Provider Notified 12/06/21  Time Provider Notified 2000  Method of Notification Call  Notification Reason Change in status  Provider response See new orders  Notify: Rapid Response  Name of Rapid Response RN Notified Dave RN  Date Rapid Response Notified 12/06/21  Time Rapid Response Notified 2220  Document  Patient Outcome Not stable and remains on department  Progress note created (see row info) Yes  Assess: SIRS CRITERIA  SIRS Temperature  1  SIRS Pulse 0  SIRS Respirations  1  SIRS WBC 1  SIRS Score Sum  3

## 2021-12-06 NOTE — Progress Notes (Addendum)
Paged at 20:00 for fever of 101.3, went bedside to evaluate.  Upon arrival, patient was sitting comfortably in bed. Vitals notable for sinus tachycardia and SpO2 92% on 4L of oxygen. Patient reports a new productive cough that started earlier today 9-10. On exam, crackles were present and worse in the lower fields. No leg swelling or erythema and patient denies any urinary symptoms such as dysuria and frequency.  Bedside chest radiograph was ordered to evaluate for possible pneumonia and repeat blood cultures were drawn. Acetaminophen was prescribed for management of symptoms. If imaging demonstrates pneumonia, then expansion of antibiotic coverage will become necessary.    Addendum: CXR showing new moderate bilateral layering pleural effusions with some associated atelectasis vs consolidation (difficult to see given effusions). She was placed on a D5-LR infusion @125cc /hr on 9/8 @1400 , will stop this due to concern for volume overload as etiology of effusions. Will give trial of IV lasix 20mg  once and see if this helps with hypoxia. Discussed with RN to get blood cultures, then given tylenol and her zosyn dose. Discussed with RN to page back should patient remain febrile, if so, will add gram-positive and atypical coverage for possible PNA.   Plan: -stopped D5-LR infusion -trial of IV lasix 20mg  x1 -blood cultures x2, then tylenol and dose of zosyn -if remains febrile, will add GP and atypical coverage for possible PNA

## 2021-12-06 NOTE — Progress Notes (Addendum)
Subjective:   Patient is feeling well overall today. States that her pain medication continues to control her pain appropriately.  She denies chest pain today. Discussed plan to follow-up on surgery recommendations. Discussed plan to replenish her potassium today.  Objective:  Vital signs in last 24 hours: Vitals:   12/05/21 2351 12/06/21 0249 12/06/21 0353 12/06/21 0610  BP: 110/66 134/77    Pulse: (!) 118 (!) 129    Resp: 18 18 18  (!) 28  Temp: 99.7 F (37.6 C) 99.5 F (37.5 C)    TempSrc: Oral Oral    SpO2: 95% 94% 97% 98%  Weight:  66.5 kg    Height:       Physical Exam General: not in distress, resting comfortably in bed Cardiovascular: Tachycardic, regular rhythm, intact distal pulses. No murmurs, rubs, or gallops. No lower extremity edema  Pulmonary: normal respiratory effort, no wheezing, rales, or rhonchi     Neurological: Alert and oriented to person, place, and time. No apparent focal neurological deficits.  Skin: warm and dry.   Assessment/Plan:  Principal Problem:   Perforated viscus Active Problems:   Gastric ulcer  [name] is 46 yo with a PMH of anxiety, arthritis, asthma (well controlled), h/o H pylori and PUD who presents with abdominal pain and was admitted for perforated gastric ulcer    #Perforated viscus  Presented w/ acute worsening abdominal pain. CT demonstrated perforated posterior gastric wall ulcer. Ex-lap on 9/8 w/ biopsy of gastric ulcer. Strict NPO w/ NG tube to remain at minimum until 9/11. JP drain to remain in place. Pain well controlled w/ Dilaudid. Leukocytosis improved today. Remains afebrile. Hgb stable 8.5 today.  - IV zosyn day 3/5 - IV protonix  - Dilaudid PCA pump for pain control    #Pericarditis #Elevated troponins and diffuse ST elevations #Vtach Trop peaked at 10K on 9/9 and trending downward. Echo demonstrates EF 40-45%, LV dysfunction, RV enlargement, and mild TR. Cards suspects diffuse pericarditis as opposed  to ischemic event. Cards suspects elevate trop secondary to tachycardia and peritonitis from perforated gastric ulcer (stress induced CM). Poor cath candidate given recent surgery and recommends medical management at this time. Cards recommended starting heparin today w/ surgical clearance if trop continues to rise and Hgb stable. Episode of asymptomatic Vtach overnight. Repeat EKG was unchanged. Remains tachycardic in the 120s today.  - Hold antiplatelets per cards  - Toprol 12.5 mg once surgically cleared for PO per cards  - Cardiac monitoring   #Asthma v Emphysema # Incidental Pulmonary Nodule Chronic smoking history. Uses albuterol at home. CT demonstrates 4 mm left upper lobe pulmonary nodule. Satting well on 3L.  - Albuterol  - PFTs as outpatient - F/u with primary care for imaging surveillance of pulmonary nodule  # Hypokalemia Potassium 3.4 this morning. - Replete with IV potassium.  - Mg level tomorrow   #Arthritis  Chronic condition.  - Recommend rheumatology f/u - Avoiding NSAIDs given perforated ulcer - Voltaren gel   #Tobacco Use  Recommend smoking cessation.  - Consult TOC  - Nicotine patch   # Dispo PT does not recommend PT f/u.    Diet: NPO Bowel: none VTE: Lovenox IVF: Dextrose 5%-0.9% NaCl Code: Full code   Prior to Admission Living Arrangement: home w/ grandchildren Anticipated Discharge Location: home w/ grandchildren Barriers to Discharge: continued management Dispo: Anticipated discharge in approximately more than 2 day(s).   11/9, MD 12/06/2021, 6:27 AM Pager: [phone] After 5pm on weekdays and 1pm  on weekends: On Call pager [phone]

## 2021-12-06 NOTE — Progress Notes (Signed)
Rounding Note    Patient Name: Kimberly Terrell. [name] Date of Encounter: 12/06/2021  [facility] Cardiologist: New  Subjective   No complaints  Inpatient Medications    Scheduled Meds:  diclofenac Sodium  2 g Topical QID   enoxaparin (LOVENOX) injection  40 mg Subcutaneous Q24H   HYDROmorphone   Intravenous Q4H   nicotine  14 mg Transdermal Daily   pantoprazole (PROTONIX) IV  40 mg Intravenous Q12H   Continuous Infusions:  dextrose 5 % and 0.9% NaCl 125 mL/hr at 12/06/21 0626   piperacillin-tazobactam 3.375 g (12/06/21 0542)   potassium chloride     PRN Meds: albuterol, diphenhydrAMINE **OR** diphenhydrAMINE, metoprolol tartrate, naloxone **AND** sodium chloride flush, ondansetron **OR** ondansetron (ZOFRAN) IV, phenol, zolpidem   Vital Signs    Vitals:   12/05/21 2351 12/06/21 0249 12/06/21 0353 12/06/21 0610  BP: 110/66 134/77    Pulse: (!) 118 (!) 129    Resp: 18 18 18  (!) 28  Temp: 99.7 F (37.6 C) 99.5 F (37.5 C)    TempSrc: Oral Oral    SpO2: 95% 94% 97% 98%  Weight:  66.5 kg    Height:        Intake/Output Summary (Last 24 hours) at 12/06/2021 0734 Last data filed at 12/06/2021 0400 Gross per 24 hour  Intake 1957.07 ml  Output 1920 ml  Net 37.07 ml      12/06/2021    2:49 AM 12/04/2021   10:20 AM 09/23/2021    1:32 PM  Last 3 Weights  Weight (lbs) 146 lb 8 oz 140 lb 145 lb  Weight (kg) 66.452 kg 63.504 kg 65.772 kg      Telemetry    Sinus tachycardia - Personally Reviewed  ECG    N/a - Personally Reviewed  Physical Exam   GEN: No acute distress.   Neck: No JVD Cardiac: RRR, no murmurs, rubs, or gallops.  Respiratory: Clear to auscultation bilaterally. GI: Soft, nontender, non-distended  MS: No edema; No deformity. Neuro:  Nonfocal  Psych: Normal affect   Labs    High Sensitivity Troponin:   Recent Labs  Lab 12/04/21 1931 12/04/21 2155 12/05/21 0034 12/05/21 0832 12/05/21 1035  TROPONINIHS 6,579* 7,608* 9,474*  10,518* 9,799*     Chemistry Recent Labs  Lab 12/03/21 2200 12/05/21 0034 12/06/21 0014  NA 138 135 136  K 3.4* 3.9 3.4*  CL 104 104 105  CO2 19* 23 25  GLUCOSE 122* 142* 110*  BUN 12 15 7   CREATININE 0.78 0.91 0.69  CALCIUM 9.7 7.6* 7.5*  PROT 6.2* 4.7*  --   ALBUMIN 3.3* 2.1*  --   AST 21 70*  --   ALT 18 26  --   ALKPHOS 93 49  --   BILITOT 1.1 1.0  --   GFRNONAA >60 >60 >60  ANIONGAP 15 8 6     Lipids No results for input(s): "CHOL", "TRIG", "HDL", "LABVLDL", "LDLCALC", "CHOLHDL" in the last 168 hours.  Hematology Recent Labs  Lab 12/03/21 2200 12/05/21 0034 12/06/21 0014  WBC 16.1* 23.8* 23.1*  RBC 4.74 3.58* 3.11*  HGB 12.7 9.7* 8.5*  HCT 40.4 29.1* 25.8*  MCV 85.2 81.3 83.0  MCH 26.8 27.1 27.3  MCHC 31.4 33.3 32.9  RDW 15.3 15.2 15.4  PLT 567* 372 365   Thyroid No results for input(s): "TSH", "FREET4" in the last 168 hours.  BNPNo results for input(s): "BNP", "PROBNP" in the last 168 hours.  DDimer No results for input(s): "DDIMER"  in the last 168 hours.   Radiology    ECHOCARDIOGRAM COMPLETE  Result Date: 12/04/2021    ECHOCARDIOGRAM REPORT   Patient Name:   Maryelizabeth Eberle. [name] Date of Exam: 12/04/2021 Medical Rec #:  [id number]         Height:       63.0 in Accession #:    [id number]        Weight:       145.0 lb Date of Birth:  May 04, 1975         BSA:          1.687 m Patient Age:    46 years          BP:           110/88 mmHg Patient Gender: F                 HR:           140 bpm. Exam Location:  Inpatient Procedure: 2D Echo, Color Doppler, Cardiac Doppler and Intracardiac            Opacification Agent STAT ECHO REPORT CONTAINS CRITICAL RESULT Indications:     R07.9* Chest pain, unspecified  History:         Patient has prior history of Echocardiogram examinations, most                  recent 02/04/2020. Abnormal ECG; Signs/Symptoms:Chest Pain.  Sonographer:     Sheralyn Boatman RDCS Referring Phys:  Wendall Stade Diagnosing Phys: Charlton Haws MD  Sonographer  Comments: Technically difficult study due to poor echo windows. IMPRESSIONS  1. Distal septal/ apical/ inferior basal hypokinesis No obvious mural thrombus but would consider repeating TTE after emergent surgery for perforated gastric ulcer when HR lower and images better . Left ventricular ejection fraction, by estimation, is 40 to 45%. The left ventricle has mildly decreased function. The left ventricle has no regional wall motion abnormalities. Left ventricular diastolic parameters are indeterminate.  2. Right ventricular systolic function is mildly reduced. The right ventricular size is mildly enlarged. There is normal pulmonary artery systolic pressure.  3. The mitral valve is normal in structure. No evidence of mitral valve regurgitation. No evidence of mitral stenosis.  4. The aortic valve is tricuspid. Aortic valve regurgitation is not visualized. No aortic stenosis is present.  5. The inferior vena cava is normal in size with greater than 50% respiratory variability, suggesting right atrial pressure of 3 mmHg. FINDINGS  Left Ventricle: Distal septal/ apical/ inferior basal hypokinesis No obvious mural thrombus but would consider repeating TTE after emergent surgery for perforated gastric ulcer when HR lower and images better. Left ventricular ejection fraction, by estimation, is 40 to 45%. The left ventricle has mildly decreased function. The left ventricle has no regional wall motion abnormalities. Definity contrast agent was given IV to delineate the left ventricular endocardial borders. The left ventricular internal cavity size was normal in size. There is no left ventricular hypertrophy. Left ventricular diastolic parameters are indeterminate. Right Ventricle: The right ventricular size is mildly enlarged. Right vetricular wall thickness was not assessed. Right ventricular systolic function is mildly reduced. There is normal pulmonary artery systolic pressure. The tricuspid regurgitant velocity is 2.01  m/s, and with an assumed right atrial pressure of 3 mmHg, the estimated right ventricular systolic pressure is 19.2 mmHg. Left Atrium: Left atrial size was normal in size. Right Atrium: Right atrial size was normal in size. Pericardium: There is no evidence  of pericardial effusion. Mitral Valve: The mitral valve is normal in structure. No evidence of mitral valve regurgitation. No evidence of mitral valve stenosis. Tricuspid Valve: The tricuspid valve is normal in structure. Tricuspid valve regurgitation is mild . No evidence of tricuspid stenosis. Aortic Valve: The aortic valve is tricuspid. Aortic valve regurgitation is not visualized. No aortic stenosis is present. Pulmonic Valve: The pulmonic valve was normal in structure. Pulmonic valve regurgitation is not visualized. No evidence of pulmonic stenosis. Aorta: The aortic root is normal in size and structure. Venous: The inferior vena cava is normal in size with greater than 50% respiratory variability, suggesting right atrial pressure of 3 mmHg. IAS/Shunts: No atrial level shunt detected by color flow Doppler.  LEFT VENTRICLE PLAX 2D LVIDd:         3.50 cm LVIDs:         2.80 cm LV PW:         1.00 cm LV IVS:        1.10 cm LVOT diam:     2.30 cm LV SV:         29 LV SV Index:   17 LVOT Area:     4.15 cm  LV Volumes (MOD) LV vol d, MOD A2C: 77.1 ml LV vol d, MOD A4C: 76.8 ml LV vol s, MOD A2C: 36.6 ml LV vol s, MOD A4C: 48.9 ml LV SV MOD A2C:     40.5 ml LV SV MOD A4C:     76.8 ml LV SV MOD BP:      36.9 ml RIGHT VENTRICLE             IVC RV S prime:     15.30 cm/s  IVC diam: 1.20 cm TAPSE (M-mode): 1.3 cm LEFT ATRIUM             Index        RIGHT ATRIUM          Index LA diam:        2.50 cm 1.48 cm/m   RA Area:     9.73 cm LA Vol (A2C):   19.1 ml 11.32 ml/m  RA Volume:   20.40 ml 12.10 ml/m LA Vol (A4C):   23.0 ml 13.64 ml/m LA Biplane Vol: 23.1 ml 13.70 ml/m  AORTIC VALVE LVOT Vmax:   70.20 cm/s LVOT Vmean:  46.000 cm/s LVOT VTI:    0.070 m  AORTA Ao  Root diam: 2.80 cm Ao Asc diam:  2.50 cm MITRAL VALVE               TRICUSPID VALVE MV Area (PHT): 10.25 cm   TR Peak grad:   16.2 mmHg MV Decel Time: 74 msec     TR Vmax:        201.00 cm/s MV E velocity: 47.40 cm/s MV A velocity: 79.90 cm/s  SHUNTS MV E/A ratio:  0.59        Systemic VTI:  0.07 m                            Systemic Diam: 2.30 cm Charlton Haws MD Electronically signed by Charlton Haws MD Signature Date/Time: 12/04/2021/8:57:13 AM    Final     Cardiac Studies     Patient Profile     Shella Maxim. Lindor is a 46 y.o. female with a hx of anxiety, depression, sinus tachycardia, and H. pylori treated x2 who is being seen today  for the evaluation of elevated troponin and ECG changes at the request of emergency department.  Assessment & Plan    1.Elevated troponin - in setting of tachycardia, peritonitis with perforated peptic ulcer - trop peak 10,518 and trending down - initial EKG sinus tach 160, subsequent ekg SR ST elevation II,III,aVF, V4-V6 -follow up EKG resolving ST/T changes - echo LVEF 40-45%, mild RV dysfunction. Distal septal/ apical/ inferior basal hypokinesis    - from initial evaluation thought to be global ischemia/demand from severe systemic illness, was not a cath candidate given perforated bowel with emergent surgery - suspect most likely stress induced CM but cannot exclude an ischemic event -poor cath candidate given recent surgery, high risk to commit to DAPT given recent perforated ulcer. She also remains npo with NG tube to suction for miniimum additional 48 hours per surgery with progressing anemia -given recent surgery, lack of symptoms, trending down troponin, progressing anemia have not heparanized. Holding on antiplatel agent for same reason currently.    -when able to take PO would start toprol 12.5mg  daily initially followed by further titration of HF medications.  - would treat medically and repeat echo 3 months, if ongoing dysfunction at that time  depending on her overall clinical status could consider ischemic testing at that time.          2. Peritonitis with perforated peptic ulcer - ex lap 12/04/21, Perforated distal body posterior wall stomach ulcer with 1.5 L of gastric contents abdominal cavity and peritonitis - s/p closure of posterior gastric wall ulcer   For questions or updates, please contact [facility] Please consult [email or web address] for contact info under        Signed, Dina Rich, MD  12/06/2021, 7:34 AM

## 2021-12-06 NOTE — Progress Notes (Signed)
Patient has used her PCA button in the last four hours. However, values could not be entered for this dose received at 4am because the amounts is not accurate for last four hours.Amounts in the PCA pump was not cleared since 12/04/2021. Amounts are now cleared.

## 2021-12-06 NOTE — Progress Notes (Signed)
Patient transferred  to 6E via bed, personal belongings ( cellphone, Consulting civil engineer, clothing and shoes) sent with patient .

## 2021-12-06 NOTE — Progress Notes (Addendum)
2 Days Post-Op  Subjective: No acute changes. Pain controlled. Passing minimal flatus. Troponin peaked.   Objective: Vital signs in last 24 hours: Temp:  [98.7 F (37.1 C)-99.7 F (37.6 C)] 99.2 F (37.3 C) (09/10 0812) Pulse Rate:  [118-131] 129 (09/10 0249) Resp:  [15-28] 22 (09/10 0812) BP: (109-134)/(59-77) 134/77 (09/10 0249) SpO2:  [93 %-98 %] 98 % (09/10 1000) FiO2 (%):  [0 %-30 %] 30 % (09/10 0610) Weight:  [66.5 kg] 66.5 kg (09/10 0249) Last BM Date : 12/02/21  Intake/Output from previous day: 09/09 0701 - 09/10 0700 In: 1957.1 [I.V.:1757.1; IV Piggyback:200] Out: 1920 [Urine:500; Emesis/NG output:1350; Drains:70] Intake/Output this shift: Total I/O In: -  Out: 950 [Urine:400; Emesis/NG output:550]  PE: General: resting comfortably, NAD Neuro: alert and oriented, no focal deficits HEENT: NG draining gastric contents Resp: normal work of breathing Abdomen: Increasing distension today. Midline incision is open at skin, wound bed clean and dry. JP with serosanguinous drainage. Extremities: warm and well-perfused   Lab Results:  Recent Labs    12/05/21 0034 12/06/21 0014  WBC 23.8* 23.1*  HGB 9.7* 8.5*  HCT 29.1* 25.8*  PLT 372 365   BMET Recent Labs    12/05/21 0034 12/06/21 0014  NA 135 136  K 3.9 3.4*  CL 104 105  CO2 23 25  GLUCOSE 142* 110*  BUN 15 7  CREATININE 0.91 0.69  CALCIUM 7.6* 7.5*   PT/INR No results for input(s): "LABPROT", "INR" in the last 72 hours. CMP     Component Value Date/Time   NA 136 12/06/2021 0014   NA 140 07/08/2020 1120   K 3.4 (L) 12/06/2021 0014   CL 105 12/06/2021 0014   CO2 25 12/06/2021 0014   GLUCOSE 110 (H) 12/06/2021 0014   BUN 7 12/06/2021 0014   BUN 11 07/08/2020 1120   CREATININE 0.69 12/06/2021 0014   CALCIUM 7.5 (L) 12/06/2021 0014   PROT 4.7 (L) 12/05/2021 0034   PROT 6.8 04/09/2020 1113   ALBUMIN 2.1 (L) 12/05/2021 0034   ALBUMIN 4.5 04/09/2020 1113   AST 70 (H) 12/05/2021 0034    ALT 26 12/05/2021 0034   ALKPHOS 49 12/05/2021 0034   BILITOT 1.0 12/05/2021 0034   BILITOT 0.3 04/09/2020 1113   GFRNONAA >60 12/06/2021 0014   GFRAA 117 04/09/2020 1113   Lipase     Component Value Date/Time   LIPASE 22 12/03/2021 2200       Studies/Results: No results found.  Anti-infectives: Anti-infectives (From admission, onward)    Start     Dose/Rate Route Frequency Ordered Stop   12/04/21 1445  piperacillin-tazobactam (ZOSYN) IVPB 3.375 g  Status:  Discontinued        3.375 g 12.5 mL/hr over 240 Minutes Intravenous Every 8 hours 12/04/21 1357 12/04/21 1434   12/04/21 1200  piperacillin-tazobactam (ZOSYN) IVPB 3.375 g        3.375 g 12.5 mL/hr over 240 Minutes Intravenous Every 8 hours 12/04/21 1008 12/09/21 1159   12/04/21 0530  piperacillin-tazobactam (ZOSYN) IVPB 3.375 g        3.375 g 100 mL/hr over 30 Minutes Intravenous  Once 12/04/21 0517 12/04/21 0704        Assessment/Plan 46 yo female with perforated gastric ulcer, POD2 s/p exploratory laparotomy with biopsy of gastric ulcer and Dorene Ar. - Surgical pathology pending - Continue NG tube to LIS to minimize gastric distension. Ok for small amounts of ice chips but patient should otherwise be strict NPO.  NG for a minimum of 3 days.  - Keep JP in place - Continue antibiotics for 5 days postop - Cardiology following for elevated troponins, thought to be demand ischemia. - Ambulate, PT ordered - VTE: Lovenox, SCDs - Dispo: inpatient, surgery will follow closely    LOS: 2 days    Sophronia Simas, MD [facility] Surgery General, Hepatobiliary and Pancreatic Surgery 12/06/21 11:42 AM

## 2021-12-07 ENCOUNTER — Inpatient Hospital Stay (HOSPITAL_COMMUNITY)

## 2021-12-07 ENCOUNTER — Encounter (HOSPITAL_COMMUNITY): Payer: Self-pay | Admitting: Surgery

## 2021-12-07 DIAGNOSIS — R198 Other specified symptoms and signs involving the digestive system and abdomen: Secondary | ICD-10-CM | POA: Diagnosis not present

## 2021-12-07 DIAGNOSIS — R5082 Postprocedural fever: Secondary | ICD-10-CM

## 2021-12-07 DIAGNOSIS — K631 Perforation of intestine (nontraumatic): Secondary | ICD-10-CM | POA: Diagnosis not present

## 2021-12-07 LAB — CBC
HCT: 30.9 % — ABNORMAL LOW (ref 36.0–46.0)
Hemoglobin: 10.1 g/dL — ABNORMAL LOW (ref 12.0–15.0)
MCH: 27 pg (ref 26.0–34.0)
MCHC: 32.7 g/dL (ref 30.0–36.0)
MCV: 82.6 fL (ref 80.0–100.0)
Platelets: 457 10*3/uL — ABNORMAL HIGH (ref 150–400)
RBC: 3.74 MIL/uL — ABNORMAL LOW (ref 3.87–5.11)
RDW: 15.1 % (ref 11.5–15.5)
WBC: 24.7 10*3/uL — ABNORMAL HIGH (ref 4.0–10.5)
nRBC: 0 % (ref 0.0–0.2)

## 2021-12-07 LAB — BASIC METABOLIC PANEL
Anion gap: 8 (ref 5–15)
BUN: <5 mg/dL — ABNORMAL LOW (ref 6–20)
CO2: 28 mmol/L (ref 22–32)
Calcium: 8.4 mg/dL — ABNORMAL LOW (ref 8.9–10.3)
Chloride: 99 mmol/L (ref 98–111)
Creatinine, Ser: 0.7 mg/dL (ref 0.44–1.00)
GFR, Estimated: >60 mL/min (ref >60)
Glucose, Bld: 92 mg/dL (ref 70–99)
Potassium: 3.1 mmol/L — ABNORMAL LOW (ref 3.5–5.1)
Sodium: 135 mmol/L (ref 135–145)

## 2021-12-07 LAB — MAGNESIUM: Magnesium: 1.7 mg/dL (ref 1.7–2.4)

## 2021-12-07 LAB — SURGICAL PATHOLOGY

## 2021-12-07 MED ORDER — ORAL CARE MOUTH RINSE
15.0000 mL | OROMUCOSAL | Status: DC
  Administered 2021-12-07 – 2021-12-15 (×28): 15 mL via OROMUCOSAL

## 2021-12-07 MED ORDER — BENZONATATE 100 MG PO CAPS: 100.0000 mg | ORAL_CAPSULE | Freq: Two times a day (BID) | ORAL | Status: DC | PRN

## 2021-12-07 MED ORDER — DM-GUAIFENESIN ER 30-600 MG PO TB12
1.0000 | ORAL_TABLET | Freq: Two times a day (BID) | ORAL | Status: DC
  Filled 2021-12-07: qty 1

## 2021-12-07 MED ORDER — POTASSIUM CHLORIDE 10 MEQ/100ML IV SOLN
10.0000 meq | INTRAVENOUS | Status: AC
  Administered 2021-12-07 (×6): 10 meq via INTRAVENOUS
  Filled 2021-12-07 (×6): qty 100

## 2021-12-07 MED ORDER — MAGNESIUM SULFATE 2 GM/50ML IV SOLN
2.0000 g | Freq: Once | INTRAVENOUS | Status: AC
  Administered 2021-12-07: 2 g via INTRAVENOUS
  Filled 2021-12-07: qty 50

## 2021-12-07 MED ORDER — BACITRACIN-NEOMYCIN-POLYMYXIN OINTMENT TUBE
TOPICAL_OINTMENT | Freq: Every day | CUTANEOUS | Status: DC
  Filled 2021-12-07: qty 14

## 2021-12-07 MED ORDER — DM-GUAIFENESIN ER 30-600 MG PO TB12
1.0000 | ORAL_TABLET | ORAL | Status: AC
  Administered 2021-12-07: 1 via ORAL
  Filled 2021-12-07: qty 1

## 2021-12-07 MED ORDER — METOPROLOL TARTRATE 5 MG/5ML IV SOLN
5.0000 mg | Freq: Three times a day (TID) | INTRAVENOUS | Status: DC
  Administered 2021-12-07 – 2021-12-09 (×6): 5 mg via INTRAVENOUS
  Filled 2021-12-07 (×6): qty 5

## 2021-12-07 NOTE — Progress Notes (Addendum)
Subjective:  Overnight Events: Pt spiked a fever of 101.1 at 1845. Received tylenol and ice packs.  Patient is uncomfortable appearing this morning. States she is having some constant abdominal pain that is 5/10 in severity. Describes the pain as a fullness localized to the lateral sides of her abdomen bilaterally. Also reports productive cough since yesterday with some SOB. She has been able to urinate as normal.  Objective:  Vital signs in last 24 hours: Vitals:   12/07/21 0739 12/07/21 0800 12/07/21 0849 12/07/21 0900  BP: 124/72     Pulse: 100 (!) 105 (!) 104 (!) 106  Resp: 19     Temp: 98 F (36.7 C)     TempSrc: Oral     SpO2: 97% 97% 98% 98%  Weight:      Height:       Weight change:   Intake/Output Summary (Last 24 hours) at 12/07/2021 1005 Last data filed at 12/07/2021 0849 Gross per 24 hour  Intake 0 ml  Output 2450 ml  Net -2450 ml   General: Uncomfortable appearing, lying in bed Cardiovascular: Tachycardic, regular rhythm. No murmurs, rubs, or gallops. No lower extremity edema.  Pulmonary: Normal work of breathing. Crackles bilaterally in lower lobes.  Gastrointestinal: Diffuse abdominal tenderness in all four quadrants. Moderate amount of serous drainage within JP drain.  Skin: Abdominal surgical incision site clean and dry without surrounding erythema.  Neurological: Alert and oriented to person, place, and time. No apparent focal neurological deficits.  Skin: warm and dry.   Assessment/Plan:  Principal Problem:   Perforated viscus Active Problems:   Gastric ulcer  46 yo female with a PMH of anxiety, arthritis, asthma (well controlled), h/o H pylori and PUD who presents with acutely worsened abdominal pain and admitted for perforated gastric ulcer.   #Perforated viscus  #Post-op Fever Ex-lap on 9/8 w/ biopsy of gastric ulcer. Strict NPO w/ NG tube. JP drain to remain in place.  Febrile overnight to 101.1 w/ increased oxygen requirement of 5L. Also  developed productive cough overnight. CXR demonstrated new moderate bilateral pleural effusions w/ atelectasis and consolidation. D5-LR drip was stopped and IV lasix 20 mg was given. Breathing improved afterwards. Fever resolved w/ tylenol. Satting well on 3L this morning. HGB continues to improve today. Blood cultures negative to date. Leukocytosis worsened today. Patient also reporting abdominal pain this morning. Given her fever last night, elevated white count, and recent surgery, concern for possible abdominal abscess. Surgery planning for UGI tomorrow.  - IV zosyn day 4/5 - NPO with NG tube - IV protonix  - H. Pylori pending  - Dilaudid PCA pump for pain control  - Tylenol PRN - Recommend ambulation and incentive spirometer  #Pericarditis #Elevated troponins and diffuse ST elevations #Vtach Trop peaked at 10K on 9/9 and trending downward. Echo shows EF 40-45%, LV dysfunction, RV enlargement, and mild TR. Cards suspects elevate trop secondary to tachycardia and peritonitis from perforated gastric ulcer (stress induced CM). Poor cath candidate given recent surgery and recommends medical management at this time. Episode of asymptomatic ventricular tachycardia on 9/9 w/ unchanged EKG. Remains tachycardic this morning.  - Hold antiplatelets per cards  - Toprol 12.5 mg once surgically cleared for PO per cards  - Cardiac monitoring  # Hypokalemia # Hypomagnesemia  Potassium 3.1 this morning. Likely worsened with lasix given overnight. Magnesium at lower limit of normal today.  - IV potassium today - IV Magnesium today - Mg level tomorrow - Trend BMP  #Asthma  #  Incidental Pulmonary Nodule # Tobacco use Chronic smoking history. Uses albuterol at home. CT demonstrates 4 mm left upper lobe pulmonary nodule. Satting well on 3L this morning  - Albuterol  - PFTs as outpatient - F/u with primary care for imaging surveillance of pulmonary nodule - Recommend smoking cessation. - Nicotine patch    #Arthritis  Chronic condition.  - Recommend rheumatology f/u - Avoiding NSAIDs given perforated ulcer - Voltaren gel   Diet: NPO Bowel: none VTE: Lovenox IVF: none Code: Full code   Prior to Admission Living Arrangement: home w/ grandchildren Anticipated Discharge Location: home w/ grandchildren Barriers to Discharge: continued management Dispo: Anticipated discharge in approximately more than 2 day(s).    LOS: 3 days   Erin Hearing I, Medical Student 12/07/2021, 10:05 AM   Attestation for Student Documentation:  I personally was present and performed or re-performed the history, physical exam and medical decision-making activities of this service and have verified that the service and findings are accurately documented in the student's note.  Karoline Caldwell, MD 12/07/2021, 3:06 PM

## 2021-12-07 NOTE — Progress Notes (Addendum)
Rounding Note    Patient Name: Kimberly Terrell. Waldrop Date of Encounter: 12/07/2021  [facility] Cardiologist: None   Subjective   No chest pain, + abd pain with NG and cough + productive white  Inpatient Medications    Scheduled Meds:  dextromethorphan-guaiFENesin  1 tablet Oral BID   diclofenac Sodium  2 g Topical QID   enoxaparin (LOVENOX) injection  40 mg Subcutaneous Q24H   HYDROmorphone   Intravenous Q4H   nicotine  14 mg Transdermal Daily   mouth rinse  15 mL Mouth Rinse 4 times per day   pantoprazole (PROTONIX) IV  40 mg Intravenous Q12H   Continuous Infusions:  magnesium sulfate bolus IVPB     piperacillin-tazobactam 3.375 g (12/07/21 0510)   potassium chloride     PRN Meds: acetaminophen, albuterol, diphenhydrAMINE **OR** diphenhydrAMINE, metoprolol tartrate, naloxone **AND** sodium chloride flush, ondansetron **OR** ondansetron (ZOFRAN) IV, phenol, zolpidem   Vital Signs    Vitals:   12/07/21 0449 12/07/21 0513 12/07/21 0723 12/07/21 0739  BP: (!) 149/70   124/72  Pulse: (!) 103   100  Resp: 20 18 (!) 22 19  Temp: 98.6 F (37 C)   98 F (36.7 C)  TempSrc: Oral   Oral  SpO2: 95% 96% 98% 97%  Weight:      Height:        Intake/Output Summary (Last 24 hours) at 12/07/2021 0823 Last data filed at 12/07/2021 0723 Gross per 24 hour  Intake --  Output 3400 ml  Net -3400 ml      12/06/2021    2:49 AM 12/04/2021   10:20 AM 09/23/2021    1:32 PM  Last 3 Weights  Weight (lbs) 146 lb 8 oz 140 lb 145 lb  Weight (kg) 66.452 kg 63.504 kg 65.772 kg      Telemetry    ST with short bursts NSVT  6-8 beats at a time - Personally Reviewed  ECG    No new - Personally Reviewed  Physical Exam   GEN: No acute distress.  NG in place Neck: mild JVD at 30 degrees Cardiac: RRR, no murmurs, rubs, or gallops.  Respiratory: scattered rhonchi to auscultation bilaterally. GI: Soft, nontender, non-distended , JP drain MS: No edema; No deformity. Neuro:   Nonfocal  Psych: Normal affect   Labs    High Sensitivity Troponin:   Recent Labs  Lab 12/04/21 2155 12/05/21 0034 12/05/21 0832 12/05/21 1035 12/06/21 0701  TROPONINIHS 7,608* 9,474* 10,518* 9,799* 6,061*     Chemistry Recent Labs  Lab 12/03/21 2200 12/05/21 0034 12/06/21 0014 12/06/21 2322 12/07/21 0337  NA 138 135 136  --  135  K 3.4* 3.9 3.4*  --  3.1*  CL 104 104 105  --  99  CO2 19* 23 25  --  28  GLUCOSE 122* 142* 110*  --  92  BUN 12 15 7   --  <5*  CREATININE 0.78 0.91 0.69  --  0.70  CALCIUM 9.7 7.6* 7.5*  --  8.4*  MG  --   --   --  1.7  --   PROT 6.2* 4.7*  --   --   --   ALBUMIN 3.3* 2.1*  --   --   --   AST 21 70*  --   --   --   ALT 18 26  --   --   --   ALKPHOS 93 49  --   --   --  BILITOT 1.1 1.0  --   --   --   GFRNONAA >60 >60 >60  --  >60  ANIONGAP 15 8 6   --  8    Lipids No results for input(s): "CHOL", "TRIG", "HDL", "LABVLDL", "LDLCALC", "CHOLHDL" in the last 168 hours.  Hematology Recent Labs  Lab 12/05/21 0034 12/06/21 0014 12/07/21 0337  WBC 23.8* 23.1* 24.7*  RBC 3.58* 3.11* 3.74*  HGB 9.7* 8.5* 10.1*  HCT 29.1* 25.8* 30.9*  MCV 81.3 83.0 82.6  MCH 27.1 27.3 27.0  MCHC 33.3 32.9 32.7  RDW 15.2 15.4 15.1  PLT 372 365 457*   Thyroid No results for input(s): "TSH", "FREET4" in the last 168 hours.  BNPNo results for input(s): "BNP", "PROBNP" in the last 168 hours.  DDimer No results for input(s): "DDIMER" in the last 168 hours.   Radiology    DG CHEST PORT 1 VIEW  Result Date: 12/06/2021 CLINICAL DATA:  Hypoxia EXAM: PORTABLE CHEST 1 VIEW COMPARISON:  CT and radiographs 12/04/2021 FINDINGS: New hazy opacities project over the bilateral mid and lower lungs. This is likely combination of layering pleural effusions and associated atelectasis/consolidation. Normal cardiomediastinal silhouette. No pneumothorax. New enteric tube tip in the stomach with side port near the GE junction. IMPRESSION: New moderate bilateral layering pleural  effusions and associated atelectasis/consolidation. Pneumonia is difficult to exclude. New enteric tube tip in the stomach with side port near the GE junction. Consider advancement 6-7 cm to ensure side port is within the stomach. Electronically Signed   By: 02/03/2022 M.D.   On: 12/06/2021 20:49    Cardiac Studies   Echo 12/04/21 IMPRESSIONS     1. Distal septal/ apical/ inferior basal hypokinesis No obvious mural  thrombus but would consider repeating TTE after emergent surgery for  perforated gastric ulcer when HR lower and images better . Left  ventricular ejection fraction, by estimation, is  40 to 45%. The left ventricle has mildly decreased function. The left  ventricle has no regional wall motion abnormalities. Left ventricular  diastolic parameters are indeterminate.   2. Right ventricular systolic function is mildly reduced. The right  ventricular size is mildly enlarged. There is normal pulmonary artery  systolic pressure.   3. The mitral valve is normal in structure. No evidence of mitral valve  regurgitation. No evidence of mitral stenosis.   4. The aortic valve is tricuspid. Aortic valve regurgitation is not  visualized. No aortic stenosis is present.   5. The inferior vena cava is normal in size with greater than 50%  respiratory variability, suggesting right atrial pressure of 3 mmHg.   FINDINGS   Left Ventricle: Distal septal/ apical/ inferior basal hypokinesis No  obvious mural thrombus but would consider repeating TTE after emergent  surgery for perforated gastric ulcer when HR lower and images better. Left  ventricular ejection fraction, by  estimation, is 40 to 45%. The left ventricle has mildly decreased  function. The left ventricle has no regional wall motion abnormalities.  Definity contrast agent was given IV to delineate the left ventricular  endocardial borders. The left ventricular  internal cavity size was normal in size. There is no left ventricular   hypertrophy. Left ventricular diastolic parameters are indeterminate.   Right Ventricle: The right ventricular size is mildly enlarged. Right  vetricular wall thickness was not assessed. Right ventricular systolic  function is mildly reduced. There is normal pulmonary artery systolic  pressure. The tricuspid regurgitant  velocity is 2.01 m/s, and with an assumed  right atrial pressure of 3 mmHg,  the estimated right ventricular systolic pressure is 19.2 mmHg.   Left Atrium: Left atrial size was normal in size.   Right Atrium: Right atrial size was normal in size.   Pericardium: There is no evidence of pericardial effusion.   Mitral Valve: The mitral valve is normal in structure. No evidence of  mitral valve regurgitation. No evidence of mitral valve stenosis.   Tricuspid Valve: The tricuspid valve is normal in structure. Tricuspid  valve regurgitation is mild . No evidence of tricuspid stenosis.   Aortic Valve: The aortic valve is tricuspid. Aortic valve regurgitation is  not visualized. No aortic stenosis is present.   Pulmonic Valve: The pulmonic valve was normal in structure. Pulmonic valve  regurgitation is not visualized. No evidence of pulmonic stenosis.   Aorta: The aortic root is normal in size and structure.   Venous: The inferior vena cava is normal in size with greater than 50%  respiratory variability, suggesting right atrial pressure of 3 mmHg.   IAS/Shunts: No atrial level shunt detected by color flow Doppler.       Patient Profile     46 y.o. female hx of anxiety, depression, sinus tachycardia, and H. pylori treated x2 admitted 12/03/21 with abd pain, N&V, URI, and tachycardia with HR to 160s.  Did develop chest pain in ER. Pain resolved after fluid resuscitation and improved HR.  Elevated troponin.   Assessment & Plan    Elevated troponinNSTEMI  --in setting of tachycardia, peritenonitis with perforated peptic ulcer --troponin pk 10,518 now trending down  yesterday 6,061 --echo with decrease in EF from 2021 60-65% to 40-45% Distal septal/ apical/ inferior basal hypokinesis No obvious mural thrombus but would consider repeating TTE after emergent surgery for perforated gastric ulcer when HR lower and images better .  RV systolic function mildly reduced. RV size is mildly enlarged, valves normal and no pericardial effusion.   - from initial evaluation thought to be global ischemia/demand from severe systemic illness, was not a cath candidate given perforated bowel with emergent surgery - suspect most likely stress induced CM but cannot exclude an ischemic event -poor cath candidate given recent surgery, high risk to commit to DAPT given recent perforated ulcer. She also remains npo with NG tube to suction for miniimum additional 48 hours per surgery with progressing anemia -given recent surgery, lack of symptoms, trending down troponin, progressing anemia have not heparanized. Holding on antiplatel agent for same reason currently.  ---when able to take PO would start toprol 12.5mg  daily initially followed by further titration of HF medications.  - would treat medically and repeat echo 3 months, if ongoing dysfunction at that time depending on her overall clinical status could consider ischemic testing at that time.     Peritonitis with perforated peptic ulcer - ex lap 12/04/21, Perforated distal body posterior wall stomach ulcer with 1.5 L of gastric contents abdominal cavity and peritonitis - s/p closure of posterior gastric wall ulcer  --fever during the night   NSVT short bursts  --BP stable to soft ? Add low dose IV BB defer to MD  Hypokalemia/ hypo Mg+   --being replaced IV   For questions or updates, please contact [facility] Please consult [email or web address] for contact info under   Signed, Nada Boozer, NP  12/07/2021, 8:23 AM   Patient seen and examined, note reviewed with the signed Advanced Practice Provider. I personally  reviewed laboratory data, imaging studies and relevant notes. I independently  examined the patient and formulated the important aspects of the plan. I have personally discussed the plan with the patient and/or family. Comments or changes to the note/plan are indicated below.  Patient seen and examined at her bedside.  Noted short burst of NSVT on tele.  Will start low dose IV lopressor with parameters.  Echo with depressed EF and wall motion abnormalities - give acute illness and recent surgery not ideal for further ischemic work up. She need medical management once can tolerate po will start.  We will continue to follow with you  Thomasene Ripple DO, MS [facility] Attending Cardiologist [facility]  [address] [place], [place] [address] [phone] Website: [email or web address]

## 2021-12-07 NOTE — Progress Notes (Signed)
It was reported by dayshift nurse that patient became febrile (101.1 F) at 1845 on 12/06/2021. Ice packs was applied to patient. Patient temperature came down to 99.4, then it spiked to 101.10F. Upon assessment, patient had coarse crackles in both lower lobes with a productive cough that begin in the afternoon of 12/06/2021. Patient required 5L [place] with a saturation of 92% from 3L. Austin Miles MD was notified. See new orders. Since treatment patient is currently afebrile. O2 saturation is 95-96% on 3L and less ShOB. Will continue to monitor patient for changes.

## 2021-12-07 NOTE — Anesthesia Postprocedure Evaluation (Signed)
Anesthesia Post Note  Patient: Kimberly Terrell. [name]  Procedure(s) Performed: EXPLORATORY LAPAROTOMY     Patient location during evaluation: PACU Anesthesia Type: General Level of consciousness: awake and alert Pain management: pain level controlled Vital Signs Assessment: post-procedure vital signs reviewed and stable Respiratory status: spontaneous breathing, nonlabored ventilation, respiratory function stable and patient connected to nasal cannula oxygen Cardiovascular status: blood pressure returned to baseline and stable Postop Assessment: no apparent nausea or vomiting Anesthetic complications: no   No notable events documented.  Last Vitals:  Vitals:   12/07/21 1600 12/07/21 1700  BP:    Pulse: (!) 107 (!) 108  Resp:    Temp:    SpO2: 98% 97%    Last Pain:  Vitals:   12/07/21 1530  TempSrc:   PainSc: 5    Pain Goal:                   Trevor Iha

## 2021-12-07 NOTE — Progress Notes (Addendum)
3 Days Post-Op  Subjective: CC: Febrile to 101.1 overnight. Notes sob and productive cough with clear sputum. Primary concerned for pna based on cxr.   She reports some nausea. Abdominal pain mainly around her incision and on the left side of her abdomen that is stable. Xray yesterday shows NGT near side port near the GE junction. RN not sure if this was advanced but has not been during his shift. Patient reports it was not advanced. Appears to be functioning - NGT output 1250/24 hours.   Objective: Vital signs in last 24 hours: Temp:  [97.6 F (36.4 C)-101.1 F (38.4 C)] 98 F (36.7 C) (09/11 0739) Pulse Rate:  [92-108] 98 (09/11 1000) Resp:  [18-26] 19 (09/11 0739) BP: (105-149)/(57-82) 124/72 (09/11 0739) SpO2:  [92 %-98 %] 97 % (09/11 1000) FiO2 (%):  [30 %-35 %] 30 % (09/11 0513) Last BM Date : 12/02/21  Intake/Output from previous day: 09/10 0701 - 09/11 0700 In: -  Out: 3375 [Urine:2075; Emesis/NG output:1250; Drains:50] Intake/Output this shift: Total I/O In: 0  Out: 25 [Drains:25]  PE: Gen:  Alert, NAD, pleasant Card:  Tachycardic with regular rhythm Pulm:  Rales at the bases, on o2 Abd: Soft, ND,some ttp around her incision - no rigidity or guarding, +BS, upper midline incision open and appears clean without signs of dehiscence. Drain SS.  Ext:  No LE edema  Psych: A&Ox3   Lab Results:  Recent Labs    12/06/21 0014 12/07/21 0337  WBC 23.1* 24.7*  HGB 8.5* 10.1*  HCT 25.8* 30.9*  PLT 365 457*   BMET Recent Labs    12/06/21 0014 12/07/21 0337  NA 136 135  K 3.4* 3.1*  CL 105 99  CO2 25 28  GLUCOSE 110* 92  BUN 7 <5*  CREATININE 0.69 0.70  CALCIUM 7.5* 8.4*   PT/INR No results for input(s): "LABPROT", "INR" in the last 72 hours. CMP     Component Value Date/Time   NA 135 12/07/2021 0337   NA 140 07/08/2020 1120   K 3.1 (L) 12/07/2021 0337   CL 99 12/07/2021 0337   CO2 28 12/07/2021 0337   GLUCOSE 92 12/07/2021 0337   BUN <5 (L)  12/07/2021 0337   BUN 11 07/08/2020 1120   CREATININE 0.70 12/07/2021 0337   CALCIUM 8.4 (L) 12/07/2021 0337   PROT 4.7 (L) 12/05/2021 0034   PROT 6.8 04/09/2020 1113   ALBUMIN 2.1 (L) 12/05/2021 0034   ALBUMIN 4.5 04/09/2020 1113   AST 70 (H) 12/05/2021 0034   ALT 26 12/05/2021 0034   ALKPHOS 49 12/05/2021 0034   BILITOT 1.0 12/05/2021 0034   BILITOT 0.3 04/09/2020 1113   GFRNONAA >60 12/07/2021 0337   GFRAA 117 04/09/2020 1113   Lipase     Component Value Date/Time   LIPASE 22 12/03/2021 2200    Studies/Results: DG CHEST PORT 1 VIEW  Result Date: 12/06/2021 CLINICAL DATA:  Hypoxia EXAM: PORTABLE CHEST 1 VIEW COMPARISON:  CT and radiographs 12/04/2021 FINDINGS: New hazy opacities project over the bilateral mid and lower lungs. This is likely combination of layering pleural effusions and associated atelectasis/consolidation. Normal cardiomediastinal silhouette. No pneumothorax. New enteric tube tip in the stomach with side port near the GE junction. IMPRESSION: New moderate bilateral layering pleural effusions and associated atelectasis/consolidation. Pneumonia is difficult to exclude. New enteric tube tip in the stomach with side port near the GE junction. Consider advancement 6-7 cm to ensure side port is within the stomach. Electronically Signed  By: Minerva Fester M.D.   On: 12/06/2021 20:49    Anti-infectives: Anti-infectives (From admission, onward)    Start     Dose/Rate Route Frequency Ordered Stop   12/04/21 1445  piperacillin-tazobactam (ZOSYN) IVPB 3.375 g  Status:  Discontinued        3.375 g 12.5 mL/hr over 240 Minutes Intravenous Every 8 hours 12/04/21 1357 12/04/21 1434   12/04/21 1200  piperacillin-tazobactam (ZOSYN) IVPB 3.375 g        3.375 g 12.5 mL/hr over 240 Minutes Intravenous Every 8 hours 12/04/21 1008 12/09/21 1159   12/04/21 0530  piperacillin-tazobactam (ZOSYN) IVPB 3.375 g        3.375 g 100 mL/hr over 30 Minutes Intravenous  Once 12/04/21 0517  12/04/21 0704        Assessment/Plan POD 3 s/p exploratory laparotomy with biopsy of gastric ulcer and Graham patch repair for Perforated distal body posterior wall stomach ulcer by Dr. Luisa Hart on 12/07/21 - Surgical pathology pending - Continue NG tube to LIS to minimize gastric distension. Plan UGI tomorrow - H. Pylori test ordered - Cont PPI BID - Keep JP in place, currently SS - Continue antibiotics for 5 days postop. Watch wbc.  - BID WTD to midline wound - Ambulate, PT ordered  FEN: NPO, NGT to LIWS, IVF per TRH. Recommend replacing K (3.1) VTE: Lovenox, SCDs (hgb stable) ID: Zosyn 9/8 - 9/13. Febrile overnight. Monitor WBC Foley: None, voiding  - Per primary -  Elevated Tn's - Cardiology following for elevated troponins, thought to be demand ischemia vs cardiomyopathy. Tn now downtrending. Not felt ideal for further ischemic work up and will peruse medical management when taking PO NSVT Asthma/Emphysema - encourage tobacco cessation. Possible pna on imaging covered with abx Pulm nodule - F/u with primary care for imaging surveillance of pulmonary nodule   LOS: 3 days    Jacinto Halim , [facility] Surgery 12/07/2021, 11:26 AM Please see Amion for pager number during day hours 7:00am-4:30pm

## 2021-12-07 NOTE — Progress Notes (Signed)
Physical Therapy Treatment Patient Details Name: Kimberly Terrell MRN: [id number] DOB: September 18, 1975 Today's Date: 12/07/2021   History of Present Illness 46 y/o female presented to ED on 12/03/21 for epigastric pain and N/V for 1.5 days. S/p ex lap for perforated posterior wall stomach ulcer and peritonitis on 9/8. PMH: gastric ulcers, H. pylori infection, anxiety    PT Comments    Pt making good progress with mobility. Expect she will continue to progress and be able to return home without any follow up therapy.    Recommendations for follow up therapy are one component of a multi-disciplinary discharge planning process, led by the attending physician.  Recommendations may be updated based on patient status, additional functional criteria and insurance authorization.  Follow Up Recommendations  No PT follow up     Assistance Recommended at Discharge Intermittent Supervision/Assistance  Patient can return home with the following     Equipment Recommendations  None recommended by PT    Recommendations for Other Services       Precautions / Restrictions Precautions Precautions: Fall Precaution Comments: watch HR and O2; NG tube on suction, abdominal incision, JP drain on R side Restrictions Weight Bearing Restrictions: No     Mobility  Bed Mobility Overal bed mobility: Needs Assistance Bed Mobility: Rolling, Sidelying to Sit, Sit to Sidelying Rolling: Supervision Sidelying to sit: Supervision     Sit to sidelying: Supervision      Transfers Overall transfer level: Needs assistance Equipment used: None, 1 person hand held assist Transfers: Sit to/from Stand Sit to Stand: Min guard           General transfer comment: Assist for safety and lines    Ambulation/Gait Ambulation/Gait assistance: Min guard Gait Distance (Feet): 140 Feet Assistive device: Rollator (4 wheels) Gait Pattern/deviations: Step-through pattern, Decreased stride length, Trunk flexed Gait  velocity: decr Gait velocity interpretation: 1.31 - 2.62 ft/sec, indicative of limited community ambulator   General Gait Details: Assist for Media planner    Modified Rankin (Stroke Patients Only)       Balance Overall balance assessment: Mild deficits observed, not formally tested                                          Cognition Arousal/Alertness: Awake/alert Behavior During Therapy: WFL for tasks assessed/performed Overall Cognitive Status: Within Functional Limits for tasks assessed                                          Exercises      General Comments General comments (skin integrity, edema, etc.): SpO2 92% on RA with amb      Pertinent Vitals/Pain Pain Assessment Pain Assessment: 0-10 Pain Score: 4  Pain Location: abdomen Pain Descriptors / Indicators: Tightness, Aching Pain Intervention(s): Limited activity within patient's tolerance, Monitored during session    Home Living                          Prior Function            PT Goals (current goals can now be found in the care plan section) Acute Rehab PT Goals Patient Stated Goal:  to get better Progress towards PT goals: Progressing toward goals    Frequency    Min 3X/week      PT Plan Current plan remains appropriate    Co-evaluation              AM-PAC PT "6 Clicks" Mobility   Outcome Measure  Help needed turning from your back to your side while in a flat bed without using bedrails?: A Little Help needed moving from lying on your back to sitting on the side of a flat bed without using bedrails?: A Little Help needed moving to and from a bed to a chair (including a wheelchair)?: A Little Help needed standing up from a chair using your arms (e.g., wheelchair or bedside chair)?: A Little Help needed to walk in hospital room?: A Little Help needed climbing 3-5 steps with a railing? : A  Little 6 Click Score: 18    End of Session   Activity Tolerance: Patient tolerated treatment well Patient left: in bed;with call bell/phone within reach Nurse Communication: Mobility status PT Visit Diagnosis: Unsteadiness on feet (R26.81);Muscle weakness (generalized) (M62.81)     Time: 3532-9924 PT Time Calculation (min) (ACUTE ONLY): 35 min  Charges:  $Gait Training: 23-37 mins                     [facility] PT Acute Rehabilitation Services Office [phone]    Angelina Ok [facility] 12/07/2021, 2:07 PM

## 2021-12-07 NOTE — Plan of Care (Signed)

## 2021-12-07 NOTE — Progress Notes (Signed)
Pt. Accidentally removed NGT roughly 7 inches.  Dr. Oswaldo Done at bedside had me advance it back in.  Told me he would be ordering a chest xray to confirm placement.

## 2021-12-08 ENCOUNTER — Inpatient Hospital Stay (HOSPITAL_COMMUNITY)

## 2021-12-08 DIAGNOSIS — R198 Other specified symptoms and signs involving the digestive system and abdomen: Secondary | ICD-10-CM | POA: Diagnosis not present

## 2021-12-08 DIAGNOSIS — K631 Perforation of intestine (nontraumatic): Secondary | ICD-10-CM | POA: Diagnosis not present

## 2021-12-08 DIAGNOSIS — R5082 Postprocedural fever: Secondary | ICD-10-CM | POA: Diagnosis not present

## 2021-12-08 LAB — H PYLORI, IGM, IGG, IGA AB
H Pylori IgG: 0.15 Index Value (ref 0.00–0.79)
H. Pylogi, Iga Abs: <9 units (ref 0.0–8.9)
H. Pylogi, Igm Abs: <9 units (ref 0.0–8.9)

## 2021-12-08 LAB — CBC
HCT: 31.5 % — ABNORMAL LOW (ref 36.0–46.0)
Hemoglobin: 10.2 g/dL — ABNORMAL LOW (ref 12.0–15.0)
MCH: 26.8 pg (ref 26.0–34.0)
MCHC: 32.4 g/dL (ref 30.0–36.0)
MCV: 82.7 fL (ref 80.0–100.0)
Platelets: 541 10*3/uL — ABNORMAL HIGH (ref 150–400)
RBC: 3.81 MIL/uL — ABNORMAL LOW (ref 3.87–5.11)
RDW: 15.4 % (ref 11.5–15.5)
WBC: 16.6 10*3/uL — ABNORMAL HIGH (ref 4.0–10.5)
nRBC: 0 % (ref 0.0–0.2)

## 2021-12-08 LAB — BASIC METABOLIC PANEL
Anion gap: 16 — ABNORMAL HIGH (ref 5–15)
BUN: 5 mg/dL — ABNORMAL LOW (ref 6–20)
CO2: 20 mmol/L — ABNORMAL LOW (ref 22–32)
Calcium: 8.5 mg/dL — ABNORMAL LOW (ref 8.9–10.3)
Chloride: 98 mmol/L (ref 98–111)
Creatinine, Ser: 0.73 mg/dL (ref 0.44–1.00)
GFR, Estimated: >60 mL/min (ref >60)
Glucose, Bld: 73 mg/dL (ref 70–99)
Potassium: 3.3 mmol/L — ABNORMAL LOW (ref 3.5–5.1)
Sodium: 134 mmol/L — ABNORMAL LOW (ref 135–145)

## 2021-12-08 LAB — MAGNESIUM: Magnesium: 1.9 mg/dL (ref 1.7–2.4)

## 2021-12-08 MED ORDER — POTASSIUM CHLORIDE 10 MEQ/100ML IV SOLN
10.0000 meq | INTRAVENOUS | Status: AC
  Administered 2021-12-08 (×2): 10 meq via INTRAVENOUS
  Filled 2021-12-08: qty 100

## 2021-12-08 MED ORDER — IOHEXOL 300 MG/ML  SOLN
100.0000 mL | Freq: Once | INTRAMUSCULAR | Status: AC | PRN
Start: 2021-12-08 — End: 2021-12-08
  Administered 2021-12-08: 25 mL

## 2021-12-08 MED ORDER — BOOST / RESOURCE BREEZE PO LIQD CUSTOM
1.0000 | Freq: Three times a day (TID) | ORAL | Status: DC
  Administered 2021-12-08 – 2021-12-10 (×4): 1 via ORAL

## 2021-12-08 MED ORDER — FUROSEMIDE 10 MG/ML IJ SOLN
40.0000 mg | Freq: Once | INTRAMUSCULAR | Status: AC
  Administered 2021-12-08: 40 mg via INTRAVENOUS
  Filled 2021-12-08: qty 4

## 2021-12-08 MED ORDER — IOHEXOL 300 MG/ML  SOLN
100.0000 mL | Freq: Once | INTRAMUSCULAR | Status: AC | PRN
  Administered 2021-12-08: 100 mL

## 2021-12-08 NOTE — Progress Notes (Signed)
Subjective:  No events overnight. Patient appears more comfortable today. States that her breathing improved today. States her abdominal pain has improved from yesterday and that she has passed flatus this morning. Has not had a bowel movement yet. Denies fever or chills. States she is still experiencing a cough. Discussed possibility of starting TPN vs diet depending on results of upper GI series today. Patient agreeable.    Objective:  Vital signs in last 24 hours: Vitals:   12/08/21 0430 12/08/21 0443 12/08/21 0821 12/08/21 0853  BP:  (!) 132/49 131/84   Pulse:  (!) 106 (!) 112   Resp: 12  15 (!) 30  Temp:  98.6 F (37 C) 97.8 F (36.6 C)   TempSrc:  Oral Oral   SpO2: 99% 98% 97%   Weight:      Height:       Weight change:   Intake/Output Summary (Last 24 hours) at 12/08/2021 6195 Last data filed at 12/07/2021 2000 Gross per 24 hour  Intake 447.93 ml  Output 270 ml  Net 177.93 ml   General: not in acute distress, resting in bed Cardiovascular: Tachycardic, regular rhythm. No murmurs, rubs, or gallops. No lower extremity edema  Pulmonary: Normal respiratory effort, minimal crackles at the bases, no wheezes, rales, or rhonchi Gastrointestinal: Abdomen is soft. Tenderness to palpation of the abdomen near surgical site. Neurological: Alert and oriented to person, place, and time. Non focal  Skin: Skin is warm and dry. No erythema around surgical bandage on abdomen.    Assessment/Plan:  Principal Problem:   Perforated viscus Active Problems:   Gastric ulcer  Kimberly Terrell is 46 yo female with a PMH of anxiety, arthritis, asthma (well controlled), h/o H pylori and PUD who presents with acutely worsened abdominal pain and admitted for perforated gastric ulcer.  #Perforated viscus  #Post-op Fever Patient reports abdominal pain has improved today. Remains afebrile today. Satting well on 2L this morning. Hgb improved today. Leukocytosis improved today. Blood cultures  remain negative. To remain NPO today w/ NG tube and JP drain in place. Can decide on diet vs TPN today following results of upper GI series.  - IV zosyn day 5/6 - IV protonix  - H. Pylori pending  - Tylenol PRN for fever - Dilaudid PCA pump for pain control  - Continue ambulation and use of incentive spirometer  #Stress Induced Cardiomyopathy #Elevated troponins and diffuse ST elevations #Vtach Had episode of asymptomatic ventricular tachycardia on 9/9 w/ unchanged EKG. Remains tachycardic today. Likely secondary to stress from perforated gastric ulcer.  - Hold antiplatelets per cards  - Toprol 12.5 mg once surgically cleared for PO per cards  - Lopressor 5 mg per cards for episode of NSVT - Repeat echo in 3 months per cards - Cardiac monitoring  # Hypokalemia # Hypomagnesemia Potassium 3.3 this morning. Magnesium 1.9 today.  - Replete with IV potassium today - Trend BMP  #Asthma  # Incidental Pulmonary Nodule # Tobacco use Has long-term smoking history. 4 mm left upper lobe pulmonary nodule on CT. Satting well on 2L this morning  - Home Albuterol  - Nicotine patch  - PFTs as outpatient  - Recommend imaging surveillance of pulmonary nodule as outpatient   #Arthritis  Chronic.  - Recommend rheumatology f/u - Avoiding NSAIDs due to perforated ulcer - Voltaren gel  #Dispo No outpatient PT necessary per PT.    Diet: NPO Bowel: none VTE: Lovenox IVF: none Code: Full code   Prior to Admission  Living Arrangement: home w/ grandchildren Anticipated Discharge Location: home w/ grandchildren Barriers to Discharge: continued management Dispo: Anticipated discharge in approximately more than 2 day(s).    LOS: 4 days   Erin Hearing I, Medical Student 12/08/2021, 9:23 AM   Attestation for Student Documentation:  I personally was present and performed or re-performed the history, physical exam and medical decision-making activities of this service and have verified that  the service and findings are accurately documented in the student's note.  Karoline Caldwell, MD 12/08/2021, 1:20 PM

## 2021-12-08 NOTE — Plan of Care (Signed)

## 2021-12-08 NOTE — Care Management (Signed)
  Transition of Care [facility]) Screening Note   Patient Details  Name: Kimberly Terrell. Kimberly Terrell Date of Birth: 1976/01/10   Transition of Care [facility]) CM/SW Contact:    Gala Lewandowsky, RN Phone Number: 12/08/2021, 4:19 PM    Transition of Care Department [facility]) has reviewed the patient and no TOC needs have been identified at this time. We will continue to monitor patient advancement through interdisciplinary progression rounds. If new patient transition needs arise, please place a TOC consult.

## 2021-12-08 NOTE — Progress Notes (Addendum)
Rounding Note    Patient Name: Kimberly Terrell. Hubbs Date of Encounter: 12/08/2021  [facility] Cardiologist: None   Subjective   No CP, passed a little flatus this am, abd pain made worse by coughing, has a wheezy, productive cough  Inpatient Medications    Scheduled Meds:  diclofenac Sodium  2 g Topical QID   enoxaparin (LOVENOX) injection  40 mg Subcutaneous Q24H   HYDROmorphone   Intravenous Q4H   metoprolol tartrate  5 mg Intravenous Q8H   neomycin-bacitracin-polymyxin   Topical Daily   nicotine  14 mg Transdermal Daily   mouth rinse  15 mL Mouth Rinse 4 times per day   pantoprazole (PROTONIX) IV  40 mg Intravenous Q12H   Continuous Infusions:  piperacillin-tazobactam 3.375 g (12/08/21 0404)   potassium chloride     PRN Meds: acetaminophen, albuterol, diphenhydrAMINE **OR** diphenhydrAMINE, metoprolol tartrate, naloxone **AND** sodium chloride flush, ondansetron **OR** ondansetron (ZOFRAN) IV, phenol, zolpidem   Vital Signs    Vitals:   12/08/21 0345 12/08/21 0430 12/08/21 0443 12/08/21 0821  BP:   (!) 132/49 131/84  Pulse:   (!) 106 (!) 112  Resp: 12 12  15   Temp:   98.6 F (37 C) 97.8 F (36.6 C)  TempSrc:   Oral Oral  SpO2: 98% 99% 98% 97%  Weight:      Height:        Intake/Output Summary (Last 24 hours) at 12/08/2021 0826 Last data filed at 12/07/2021 2000 Gross per 24 hour  Intake 447.93 ml  Output 270 ml  Net 177.93 ml      12/06/2021    2:49 AM 12/04/2021   10:20 AM 09/23/2021    1:32 PM  Last 3 Weights  Weight (lbs) 146 lb 8 oz 140 lb 145 lb  Weight (kg) 66.452 kg 63.504 kg 65.772 kg      Telemetry    ST with short bursts NSVT  6-8 beats at a time - Personally Reviewed  ECG    No new - Personally Reviewed  Physical Exam   GEN: No acute distress.  NG in place Neck: mild JVD at 30 degrees Cardiac: RRR, no murmurs, rubs, or gallops.  Respiratory: scattered rhonchi to auscultation bilaterally. GI: Soft, nontender,  non-distended , JP drain MS: No edema; No deformity. Neuro:  Nonfocal  Psych: Normal affect   Labs    High Sensitivity Troponin:   Recent Labs  Lab 12/04/21 2155 12/05/21 0034 12/05/21 0832 12/05/21 1035 12/06/21 0701  TROPONINIHS 7,608* 9,474* 10,518* 9,799* 6,061*     Chemistry Recent Labs  Lab 12/03/21 2200 12/05/21 0034 12/06/21 0014 12/06/21 2322 12/07/21 0337 12/08/21 0428  NA 138 135 136  --  135 134*  K 3.4* 3.9 3.4*  --  3.1* 3.3*  CL 104 104 105  --  99 98  CO2 19* 23 25  --  28 20*  GLUCOSE 122* 142* 110*  --  92 73  BUN 12 15 7   --  <5* 5*  CREATININE 0.78 0.91 0.69  --  0.70 0.73  CALCIUM 9.7 7.6* 7.5*  --  8.4* 8.5*  MG  --   --   --  1.7  --   --   PROT 6.2* 4.7*  --   --   --   --   ALBUMIN 3.3* 2.1*  --   --   --   --   AST 21 70*  --   --   --   --  ALT 18 26  --   --   --   --   ALKPHOS 93 49  --   --   --   --   BILITOT 1.1 1.0  --   --   --   --   GFRNONAA >60 >60 >60  --  >60 >60  ANIONGAP 15 8 6   --  8 16*    Lipids No results for input(s): "CHOL", "TRIG", "HDL", "LABVLDL", "LDLCALC", "CHOLHDL" in the last 168 hours.  Hematology Recent Labs  Lab 12/06/21 0014 12/07/21 0337 12/08/21 0428  WBC 23.1* 24.7* 16.6*  RBC 3.11* 3.74* 3.81*  HGB 8.5* 10.1* 10.2*  HCT 25.8* 30.9* 31.5*  MCV 83.0 82.6 82.7  MCH 27.3 27.0 26.8  MCHC 32.9 32.7 32.4  RDW 15.4 15.1 15.4  PLT 365 457* 541*   Thyroid No results for input(s): "TSH", "FREET4" in the last 168 hours.  BNPNo results for input(s): "BNP", "PROBNP" in the last 168 hours.  DDimer No results for input(s): "DDIMER" in the last 168 hours.   Radiology    DG Abd Portable 1V  Result Date: 12/07/2021 CLINICAL DATA:  NG tube placement. EXAM: PORTABLE ABDOMEN - 1 VIEW COMPARISON:  Chest radiograph 12/06/2021. CTA chest, abdomen, and pelvis 12/04/2021. FINDINGS: An enteric tube remains in place with tip in the region of the proximal gastric body and side port near the GE junction, similar to  the prior chest radiograph. Gas is present in multiple grossly nondilated loops of bowel in the included portion of the abdomen. Bilateral pleural effusions and airspace opacities were more fully evaluated on yesterday's chest radiograph. IMPRESSION: Enteric tube tip in the stomach with side port near the GE junction. Electronically Signed   By: 02/03/2022 M.D.   On: 12/07/2021 15:30   DG CHEST PORT 1 VIEW  Result Date: 12/06/2021 CLINICAL DATA:  Hypoxia EXAM: PORTABLE CHEST 1 VIEW COMPARISON:  CT and radiographs 12/04/2021 FINDINGS: New hazy opacities project over the bilateral mid and lower lungs. This is likely combination of layering pleural effusions and associated atelectasis/consolidation. Normal cardiomediastinal silhouette. No pneumothorax. New enteric tube tip in the stomach with side port near the GE junction. IMPRESSION: New moderate bilateral layering pleural effusions and associated atelectasis/consolidation. Pneumonia is difficult to exclude. New enteric tube tip in the stomach with side port near the GE junction. Consider advancement 6-7 cm to ensure side port is within the stomach. Electronically Signed   By: 02/03/2022 M.D.   On: 12/06/2021 20:49    Cardiac Studies   Echo 12/04/21 IMPRESSIONS   1. Distal septal/ apical/ inferior basal hypokinesis No obvious mural  thrombus but would consider repeating TTE after emergent surgery for  perforated gastric ulcer when HR lower and images better . Left  ventricular ejection fraction, by estimation, is  40 to 45%. The left ventricle has mildly decreased function. The left  ventricle has no regional wall motion abnormalities. Left ventricular  diastolic parameters are indeterminate.   2. Right ventricular systolic function is mildly reduced. The right  ventricular size is mildly enlarged. There is normal pulmonary artery  systolic pressure.   3. The mitral valve is normal in structure. No evidence of mitral valve  regurgitation. No  evidence of mitral stenosis.   4. The aortic valve is tricuspid. Aortic valve regurgitation is not  visualized. No aortic stenosis is present.   5. The inferior vena cava is normal in size with greater than 50%  respiratory variability, suggesting right  atrial pressure of 3 mmHg.   FINDINGS   Left Ventricle: Distal septal/ apical/ inferior basal hypokinesis No  obvious mural thrombus but would consider repeating TTE after emergent  surgery for perforated gastric ulcer when HR lower and images better. Left  ventricular ejection fraction, by  estimation, is 40 to 45%. The left ventricle has mildly decreased  function. The left ventricle has no regional wall motion abnormalities.  Definity contrast agent was given IV to delineate the left ventricular  endocardial borders. The left ventricular  internal cavity size was normal in size. There is no left ventricular  hypertrophy. Left ventricular diastolic parameters are indeterminate.   Right Ventricle: The right ventricular size is mildly enlarged. Right  vetricular wall thickness was not assessed. Right ventricular systolic  function is mildly reduced. There is normal pulmonary artery systolic  pressure. The tricuspid regurgitant  velocity is 2.01 m/s, and with an assumed right atrial pressure of 3 mmHg,  the estimated right ventricular systolic pressure is 19.2 mmHg.   Left Atrium: Left atrial size was normal in size.   Right Atrium: Right atrial size was normal in size.   Pericardium: There is no evidence of pericardial effusion.   Mitral Valve: The mitral valve is normal in structure. No evidence of  mitral valve regurgitation. No evidence of mitral valve stenosis.   Tricuspid Valve: The tricuspid valve is normal in structure. Tricuspid  valve regurgitation is mild . No evidence of tricuspid stenosis.   Aortic Valve: The aortic valve is tricuspid. Aortic valve regurgitation is  not visualized. No aortic stenosis is present.    Pulmonic Valve: The pulmonic valve was normal in structure. Pulmonic valve  regurgitation is not visualized. No evidence of pulmonic stenosis.   Aorta: The aortic root is normal in size and structure.   Venous: The inferior vena cava is normal in size with greater than 50%  respiratory variability, suggesting right atrial pressure of 3 mmHg.   IAS/Shunts: No atrial level shunt detected by color flow Doppler.       Patient Profile     46 y.o. female hx of anxiety, depression, sinus tachycardia, and H. pylori treated x2 admitted 12/03/21 with abd pain, N&V, URI, and tachycardia with HR to 160s.  Did develop chest pain in ER. Pain resolved after fluid resuscitation and improved HR.  Elevated troponin.   Assessment & Plan    Elevated troponinNSTEMI  --in setting of tachycardia, peritenonitis with perforated peptic ulcer --troponin pk 10,518 now trending down, on 09/10 was 6,061 --echo with decrease in EF from 2021 60-65% to 40-45% Distal septal/ apical/ inferior basal hypokinesis No obvious mural thrombus but would consider repeating TTE after emergent surgery for perforated gastric ulcer when HR lower and images better. RV systolic function mildly reduced. RV size is mildly enlarged, valves normal and no pericardial effusion.   -- HR still elevated so will not order limited echo yet - from initial evaluation thought to be global ischemia/demand from severe systemic illness, was not a cath candidate given perforated bowel with emergent surgery - suspect most likely stress induced CM but cannot exclude ischemic event -poor cath candidate given recent surgery, high risk to commit to DAPT given recent perforated ulcer. She also remains npo with NG tube  --given recent surgery, lack of symptoms, trending down troponin, progressing anemia, have not heparanized. Holding on antiplatel agent for same reason currently.  ---when able to take PO would start toprol 12.5mg  daily initially followed by  further titration of  HF medications.  - would treat medically and repeat echo 3 months, if ongoing dysfunction at that time depending on her overall clinical status could consider ischemic testing at that time.    Peritonitis with perforated peptic ulcer - ex lap 12/04/21, Perforated distal body posterior wall stomach ulcer with 1.5 L of gastric contents abdominal cavity and peritonitis - s/p closure of posterior gastric wall ulcer  --no fever during the night -- progressing anemia s/p transfusion and improved, follow per IM/CCS   NSVT short bursts  -- BP has improved, ectopy has improved also -- on IV metop 5 q 8 hr, change to XL when dose is stable -- Mg low at 1.7, s/p IV supp, recheck in am  Hypokalemia/ hypo Mg+   -- s/p replacement and improved but K+ still low >> continue supp per IM, they have ordered it  For questions or updates, please contact [facility] Please consult [email or web address] for contact info under   Signed, Theodore Demark, PA-C  12/08/2021, 8:26 AM    Patient seen and examined, note reviewed with the signed Advanced Practice Provider. I personally reviewed laboratory data, imaging studies and relevant notes. I independently examined the patient and formulated the important aspects of the plan. I have personally discussed the plan with the patient and/or family. Comments or changes to the note/plan are indicated below.  Since started on IV Lopressor with had some improvement in the ectopic beats.  We will continue this for now and transition to p.o. once the patient is able to tolerate p.o. Given Lasix 40 mg x 1 hopefully this will help to maintain the fluid balance. As noted yesterday is very difficult for pursuing any ischemic evaluation in this patient.  Once clinical status is stable we will go ahead and repeat echocardiogram probably later this hospitalization.   Thomasene Ripple DO, MS [facility] Attending Cardiologist [facility]  [address] [place], [place] [address] [phone] Website: [email or web address]

## 2021-12-08 NOTE — Progress Notes (Signed)
Mobility Specialist - Progress Note   12/08/21 0950  Mobility  Activity Contraindicated/medical hold   Pt on hold due to going off unit for procedure. Will follow up if time permits.    Garald Braver

## 2021-12-08 NOTE — Progress Notes (Signed)
4 Days Post-Op  Subjective: CC: Afebrile last 24H. Has started passing flatus and no nausea. Abdominal pain persists but is stable and greatest around incision and when coughing. NGT output has gone down over last 24H in setting of side port near GE junction on imaging but she is also having improved bowel function. Using PCA for pain control  Objective: Vital signs in last 24 hours: Temp:  [97.8 F (36.6 C)-98.6 F (37 C)] 97.8 F (36.6 C) (09/12 0821) Pulse Rate:  [98-117] 112 (09/12 0821) Resp:  [12-30] 30 (09/12 0853) BP: (117-132)/(49-84) 131/84 (09/12 0821) SpO2:  [93 %-100 %] 97 % (09/12 0821) FiO2 (%):  [29 %-30 %] 29 % (09/12 0430) Last BM Date : 12/02/21  Intake/Output from previous day: 09/11 0701 - 09/12 0700 In: 447.9 [IV Piggyback:447.9] Out: 295 [Emesis/NG output:250; Drains:45] Intake/Output this shift: No intake/output data recorded.  PE: Gen:  Alert, NAD, pleasant Card:  Tachycardic with regular rhythm Abd: Soft, ND, some ttp around her incision - no rigidity or guarding, +BS, upper midline incision open and appears clean without signs of dehiscence. Drain SS.  Ext:  No LE edema  Psych: A&Ox3   Lab Results:  Recent Labs    12/07/21 0337 12/08/21 0428  WBC 24.7* 16.6*  HGB 10.1* 10.2*  HCT 30.9* 31.5*  PLT 457* 541*    BMET Recent Labs    12/07/21 0337 12/08/21 0428  NA 135 134*  K 3.1* 3.3*  CL 99 98  CO2 28 20*  GLUCOSE 92 73  BUN <5* 5*  CREATININE 0.70 0.73  CALCIUM 8.4* 8.5*    PT/INR No results for input(s): "LABPROT", "INR" in the last 72 hours. CMP     Component Value Date/Time   NA 134 (L) 12/08/2021 0428   NA 140 07/08/2020 1120   K 3.3 (L) 12/08/2021 0428   CL 98 12/08/2021 0428   CO2 20 (L) 12/08/2021 0428   GLUCOSE 73 12/08/2021 0428   BUN 5 (L) 12/08/2021 0428   BUN 11 07/08/2020 1120   CREATININE 0.73 12/08/2021 0428   CALCIUM 8.5 (L) 12/08/2021 0428   PROT 4.7 (L) 12/05/2021 0034   PROT 6.8 04/09/2020  1113   ALBUMIN 2.1 (L) 12/05/2021 0034   ALBUMIN 4.5 04/09/2020 1113   AST 70 (H) 12/05/2021 0034   ALT 26 12/05/2021 0034   ALKPHOS 49 12/05/2021 0034   BILITOT 1.0 12/05/2021 0034   BILITOT 0.3 04/09/2020 1113   GFRNONAA >60 12/08/2021 0428   GFRAA 117 04/09/2020 1113   Lipase     Component Value Date/Time   LIPASE 22 12/03/2021 2200    Studies/Results: DG Abd Portable 1V  Result Date: 12/07/2021 CLINICAL DATA:  NG tube placement. EXAM: PORTABLE ABDOMEN - 1 VIEW COMPARISON:  Chest radiograph 12/06/2021. CTA chest, abdomen, and pelvis 12/04/2021. FINDINGS: An enteric tube remains in place with tip in the region of the proximal gastric body and side port near the GE junction, similar to the prior chest radiograph. Gas is present in multiple grossly nondilated loops of bowel in the included portion of the abdomen. Bilateral pleural effusions and airspace opacities were more fully evaluated on yesterday's chest radiograph. IMPRESSION: Enteric tube tip in the stomach with side port near the GE junction. Electronically Signed   By: Sebastian Ache M.D.   On: 12/07/2021 15:30   DG CHEST PORT 1 VIEW  Result Date: 12/06/2021 CLINICAL DATA:  Hypoxia EXAM: PORTABLE CHEST 1 VIEW COMPARISON:  CT and radiographs  12/04/2021 FINDINGS: New hazy opacities project over the bilateral mid and lower lungs. This is likely combination of layering pleural effusions and associated atelectasis/consolidation. Normal cardiomediastinal silhouette. No pneumothorax. New enteric tube tip in the stomach with side port near the GE junction. IMPRESSION: New moderate bilateral layering pleural effusions and associated atelectasis/consolidation. Pneumonia is difficult to exclude. New enteric tube tip in the stomach with side port near the GE junction. Consider advancement 6-7 cm to ensure side port is within the stomach. Electronically Signed   By: Minerva Fester M.D.   On: 12/06/2021 20:49    Anti-infectives: Anti-infectives  (From admission, onward)    Start     Dose/Rate Route Frequency Ordered Stop   12/04/21 1445  piperacillin-tazobactam (ZOSYN) IVPB 3.375 g  Status:  Discontinued        3.375 g 12.5 mL/hr over 240 Minutes Intravenous Every 8 hours 12/04/21 1357 12/04/21 1434   12/04/21 1200  piperacillin-tazobactam (ZOSYN) IVPB 3.375 g        3.375 g 12.5 mL/hr over 240 Minutes Intravenous Every 8 hours 12/04/21 1008 12/09/21 1159   12/04/21 0530  piperacillin-tazobactam (ZOSYN) IVPB 3.375 g        3.375 g 100 mL/hr over 30 Minutes Intravenous  Once 12/04/21 0517 12/04/21 6503        Assessment/Plan POD 4 s/p exploratory laparotomy with biopsy of gastric ulcer and Graham patch repair for Perforated distal body posterior wall stomach ulcer by Dr. Luisa Hart on 12/07/21 - Surgical pathology with acute and chronic inflammation - Continue NG tube to LIS to minimize gastric distension - only 250cc/24H with imaging yesterday showing port near GE junction - continue to plan UGI today and will adjust NGT as needed pending impaging - H. Pylori test ordered - Cont PPI BID - Keep JP in place, currently SS - continue PCA for now - transition to PO pain meds after dc NGT - Continue antibiotics for 5 days postop. Watch wbc - downtrending today 24 > 16 - BID WTD to midline wound - Ambulate, PT ordered  FEN: NPO, NGT to LIWS, IVF per TRH. Recommend replacing K (3.3) VTE: Lovenox, SCDs (hgb stable) ID: Zosyn 9/8 - 9/13. Afebrile last 24H. Monitor WBC Foley: None, voiding  - Per primary -  Elevated Tn's - Cardiology following for elevated troponins, thought to be demand ischemia vs cardiomyopathy. Tn now downtrending. Not felt ideal for further ischemic work up and will peruse medical management when taking PO - when able to take PO would start toprol 12.5mg  daily initially followed by further titration of HF medications.  NSVT Asthma/Emphysema - encourage tobacco cessation. Possible pna on imaging covered with  abx Pulm nodule - F/u with primary care for imaging surveillance of pulmonary nodule   LOS: 4 days    Eric Form , [facility] Surgery 12/08/2021, 9:02 AM Please see Amion for pager number during day hours 7:00am-4:30pm

## 2021-12-09 DIAGNOSIS — K631 Perforation of intestine (nontraumatic): Secondary | ICD-10-CM | POA: Diagnosis not present

## 2021-12-09 DIAGNOSIS — R5082 Postprocedural fever: Secondary | ICD-10-CM | POA: Diagnosis not present

## 2021-12-09 DIAGNOSIS — R198 Other specified symptoms and signs involving the digestive system and abdomen: Secondary | ICD-10-CM | POA: Diagnosis not present

## 2021-12-09 LAB — BASIC METABOLIC PANEL
Anion gap: 16 — ABNORMAL HIGH (ref 5–15)
BUN: <5 mg/dL — ABNORMAL LOW (ref 6–20)
CO2: 25 mmol/L (ref 22–32)
Calcium: 8.7 mg/dL — ABNORMAL LOW (ref 8.9–10.3)
Chloride: 93 mmol/L — ABNORMAL LOW (ref 98–111)
Creatinine, Ser: 0.7 mg/dL (ref 0.44–1.00)
GFR, Estimated: >60 mL/min (ref >60)
Glucose, Bld: 117 mg/dL — ABNORMAL HIGH (ref 70–99)
Potassium: 3.3 mmol/L — ABNORMAL LOW (ref 3.5–5.1)
Sodium: 134 mmol/L — ABNORMAL LOW (ref 135–145)

## 2021-12-09 LAB — CBC
HCT: 33.1 % — ABNORMAL LOW (ref 36.0–46.0)
Hemoglobin: 10.8 g/dL — ABNORMAL LOW (ref 12.0–15.0)
MCH: 26.4 pg (ref 26.0–34.0)
MCHC: 32.6 g/dL (ref 30.0–36.0)
MCV: 80.9 fL (ref 80.0–100.0)
Platelets: 585 10*3/uL — ABNORMAL HIGH (ref 150–400)
RBC: 4.09 MIL/uL (ref 3.87–5.11)
RDW: 15.2 % (ref 11.5–15.5)
WBC: 15.7 10*3/uL — ABNORMAL HIGH (ref 4.0–10.5)
nRBC: 0 % (ref 0.0–0.2)

## 2021-12-09 LAB — CULTURE, BLOOD (ROUTINE X 2)
Culture: NO GROWTH
Culture: NO GROWTH
Special Requests: ADEQUATE

## 2021-12-09 LAB — MAGNESIUM: Magnesium: 1.9 mg/dL (ref 1.7–2.4)

## 2021-12-09 MED ORDER — POTASSIUM CHLORIDE CRYS ER 20 MEQ PO TBCR: 40.0000 meq | EXTENDED_RELEASE_TABLET | Freq: Two times a day (BID) | ORAL | Status: DC

## 2021-12-09 MED ORDER — LACTATED RINGERS IV SOLN: INTRAVENOUS | Status: AC

## 2021-12-09 MED ORDER — POTASSIUM CHLORIDE CRYS ER 20 MEQ PO TBCR
40.0000 meq | EXTENDED_RELEASE_TABLET | Freq: Two times a day (BID) | ORAL | Status: AC
  Administered 2021-12-09 (×2): 40 meq via ORAL
  Filled 2021-12-09 (×2): qty 2

## 2021-12-09 MED ORDER — PANTOPRAZOLE SODIUM 40 MG PO TBEC: 40.0000 mg | DELAYED_RELEASE_TABLET | Freq: Every day | ORAL | Status: DC

## 2021-12-09 MED ORDER — DICLOFENAC SODIUM 1 % EX GEL
2.0000 g | Freq: Four times a day (QID) | CUTANEOUS | Status: DC | PRN
  Administered 2021-12-09: 2 g via TOPICAL

## 2021-12-09 MED ORDER — OXYCODONE-ACETAMINOPHEN 5-325 MG PO TABS
1.0000 | ORAL_TABLET | ORAL | Status: DC | PRN
  Administered 2021-12-09 (×2): 1 via ORAL
  Administered 2021-12-10: 2 via ORAL
  Filled 2021-12-09 (×2): qty 1
  Filled 2021-12-09: qty 2

## 2021-12-09 MED ORDER — POTASSIUM CHLORIDE 20 MEQ PO PACK: 40.0000 meq | PACK | Freq: Two times a day (BID) | ORAL | Status: DC

## 2021-12-09 MED ORDER — MAGNESIUM SULFATE 2 GM/50ML IV SOLN
2.0000 g | Freq: Once | INTRAVENOUS | Status: AC
  Administered 2021-12-09: 2 g via INTRAVENOUS
  Filled 2021-12-09: qty 50

## 2021-12-09 MED ORDER — METOPROLOL TARTRATE 12.5 MG HALF TABLET
12.5000 mg | ORAL_TABLET | Freq: Two times a day (BID) | ORAL | Status: DC
  Administered 2021-12-09: 12.5 mg via ORAL
  Filled 2021-12-09: qty 1

## 2021-12-09 MED ORDER — SIMETHICONE 80 MG PO CHEW
80.0000 mg | CHEWABLE_TABLET | Freq: Four times a day (QID) | ORAL | Status: DC | PRN
  Administered 2021-12-09 – 2021-12-12 (×5): 80 mg via ORAL
  Filled 2021-12-09 (×5): qty 1

## 2021-12-09 MED ORDER — PANTOPRAZOLE SODIUM 40 MG PO TBEC
40.0000 mg | DELAYED_RELEASE_TABLET | Freq: Every day | ORAL | Status: DC
  Administered 2021-12-09 – 2021-12-15 (×7): 40 mg via ORAL
  Filled 2021-12-09 (×7): qty 1

## 2021-12-09 MED ORDER — POTASSIUM CHLORIDE 10 MEQ/100ML IV SOLN: 10.0000 meq | INTRAVENOUS | Status: DC

## 2021-12-09 MED ORDER — HYDROMORPHONE HCL 1 MG/ML IJ SOLN
1.0000 mg | Freq: Once | INTRAMUSCULAR | Status: AC
  Administered 2021-12-09: 1 mg via INTRAVENOUS
  Filled 2021-12-09: qty 1

## 2021-12-09 MED ORDER — BACITRACIN-NEOMYCIN-POLYMYXIN OINTMENT TUBE: TOPICAL_OINTMENT | Freq: Every day | CUTANEOUS | Status: DC | PRN

## 2021-12-09 MED ORDER — PROCHLORPERAZINE EDISYLATE 10 MG/2ML IJ SOLN
10.0000 mg | Freq: Four times a day (QID) | INTRAMUSCULAR | Status: DC | PRN
  Administered 2021-12-09 – 2021-12-10 (×3): 10 mg via INTRAVENOUS
  Filled 2021-12-09 (×3): qty 2

## 2021-12-09 NOTE — Progress Notes (Signed)
Subjective:  I was paged for the patient having diarrhea went to reassess.  Patient states that she has had 4 episodes of diarrhea since we saw her this morning.  She has not noted any blood in her stool and states that it is liquid in nature and brown.  She states that she has been having frequent episodes of diarrhea for several months and has seen a GI doctor for this issue without relief of her symptoms. Patient states that she was able not able to tolerate much of her boost shake this afternoon. She states that she was able to get down a quarter of the bottle and then vomited shortly after. She has not tried to eat again since then.  Patient is still passing gas. She states that her abdominal pain is unchanged from this morning. She still feels bloated.   Objective:  Physical Exam: General: tearful, resting in bed Cardiovascular: Tachycardic, regular rhythm. No murmurs, rubs, or gallops. Gastrointestinal: Abdomen remains distended and tympanic unchanged from this morning. Diffuse tenderness to palpation of the abdomen near her recent surgical incision.  Neurological: Alert and oriented to person, place, and time. Non focal  Skin: Skin is warm and dry. No erythema around surgical bandage on abdomen.   Plan: Upon review of records: Cryptosporidium and C. Diff. negative in 08/2021 at urgent care. 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 Pylori. GI panel negative in 03/2020.  Patient remains tachycardic but afebrile.  Vital signs otherwise stable.  I suspect the patient's symptoms may be related to recently restarting her diet.  After reviewing her chart it appears that she has never been worked up for celiac disease which could be causing her chronic diarrhea. - Tissue transglutaminase IgA ordered - Total IgA ordered - Full liquid diet as tolerated  Karoline Caldwell,  MD 12/09/2021, 3:44 PM

## 2021-12-09 NOTE — Progress Notes (Signed)
Subjective:  Overnight events: None  Patient reports she had an episode of non-bloody emesis last night which has since resolved. She states that her abdominal pain is unchanged from yesterday and tolerable. Also continues to have intermittent cough. She has been passing flatus overnight but is feeling bloated. States she has been able to tolerate clear liquid diet. Discussed need for potassium replacement today.   Objective:  Vital signs in last 24 hours: Vitals:   12/09/21 0039 12/09/21 0403 12/09/21 0423 12/09/21 0805  BP: 122/77  128/79 120/78  Pulse: (!) 102   (!) 105  Resp: 19 16 20 19   Temp: 98.6 F (37 C)  97.9 F (36.6 C) 97.8 F (36.6 C)  TempSrc: Oral  Oral Oral  SpO2: 98% 98%  98%  Weight:      Height:       Weight change:   Intake/Output Summary (Last 24 hours) at 12/09/2021 1006 Last data filed at 12/09/2021 12/11/2021 Gross per 24 hour  Intake 200 ml  Output 355 ml  Net -155 ml   General: not in acute distress, resting in bed Pulmonary:  Normal respiratory effort, no wheezes or rales, no significant pleural effusion or consolidation on bedside ultrasound of right lung.  Cardiovascular: Tachycardic, regular rhythm. No murmurs, rubs, or gallops. No lower extremity edema  Gastrointestinal: Abdomen is distended and tympanic. Tenderness to palpation of the abdomen near her recent surgical incision.  Neurological: Alert and oriented to person, place, and time. Non focal  Skin: Skin is warm and dry. No erythema around surgical bandage on abdomen.    Assessment/Plan:  Principal Problem:   Perforated viscus Active Problems:   Gastric ulcer  Kimberly Terrell is 46 yo female with a PMH of anxiety, arthritis, asthma (well controlled), h/o H pylori and PUD who presents with acutely worsened abdominal pain and admitted for perforated gastric ulcer.  #Perforated viscus  #Post-op Fever Abdominal pain appears to be improving. Remains afebrile. Leukocytosis and Hgb  continue to improve. Blood cultures remain negative. H. Pylori negative. Transitioned to clear liquid diet on 9/12 following normal UGI series. JP drain to remain in place. Transition to PO medications today. - IV zosyn day 6/6 - Protonix  - Tylenol PRN for fever - Zofran PRN for nausea - Discontinue PCA - Oxycodone-Acetaminophen PRN - Continue ambulation and use of incentive spirometer  #Acute Heart Failure with mildly reduced EF #Elevated troponins and diffuse ST elevations #Vtach Asymptomatic ventricular tachycardia on 9/9. Remains tachycardic this morning. Likely related to stress from perforated gastric ulcer. Could benefit from GDMT. Could consider starting an ACEi tomorrow.  - Hold antiplatelets per cards  - Toprol 12.5 mg  - Repeat echo in 3 months per cards - Cardiac monitoring  # Hypokalemia # Hypomagnesemia Remains hypokalemic today. - replete with oral potassium - trend BMP  #Asthma  # Incidental Pulmonary Nodule # Tobacco use Prior smoking history. 4 mm Left UL pulmonary nodule on CT. Satting well on 2L this morning  - Home Albuterol  - Nicotine patch  - PFTs as outpatient  - Recommend imaging surveillance of pulmonary nodule as outpatient    #Arthritis  Chronic.  - Recommend rheumatology f/u - Avoiding NSAIDs due to perforated ulcer - Voltaren gel   #Dispo No outpatient PT necessary per PT.   Diet: Clear liquid Bowel: none VTE: Lovenox IVF: none Code: Full code   Prior to Admission Living Arrangement: home w/ grandchildren Anticipated Discharge Location: home w/ grandchildren Barriers to Discharge: continued  management Dispo: Anticipated discharge in approximately more than 2 day(s).    LOS: 5 days   Erin Hearing I, Medical Student 12/09/2021, 10:06 AM   Attestation for Student Documentation:  I personally was present and performed or re-performed the history, physical exam and medical decision-making activities of this service and have  verified that the service and findings are accurately documented in the student's note.  Karoline Caldwell, MD 12/09/2021, 1:02 PM

## 2021-12-09 NOTE — Progress Notes (Signed)
5 Days Post-Op   Subjective/Chief Complaint: Some n/v ovenright with some clears mobilizing   Objective: Vital signs in last 24 hours: Temp:  [97.8 F (36.6 C)-99.5 F (37.5 C)] 97.8 F (36.6 C) (09/13 0805) Pulse Rate:  [102-113] 105 (09/13 0805) Resp:  [15-30] 19 (09/13 0805) BP: (119-135)/(64-104) 120/78 (09/13 0805) SpO2:  [95 %-98 %] 98 % (09/13 0805) FiO2 (%):  [30 %-33 %] 30 % (09/13 0403) Last BM Date : 12/02/21  Intake/Output from previous day: 09/12 0701 - 09/13 0700 In: 200 [P.O.:100; IV Piggyback:100] Out: 355 [Emesis/NG output:300; Drains:5] Intake/Output this shift: No intake/output data recorded.  PE:  Constitutional: No acute distress, conversant, appears states age. Eyes: Anicteric sclerae, moist conjunctiva, no lid lag Lungs: Clear to auscultation bilaterally, normal respiratory effort CV: regular rate and rhythm, no murmurs, no peripheral edema, pedal pulses 2+ GI: Soft, no masses or hepatosplenomegaly, non-tender to palpation, inc c/d/i Skin: No rashes, palpation reveals normal turgor Psychiatric: appropriate judgment and insight, oriented to person, place, and time   Lab Results:  Recent Labs    12/08/21 0428 12/09/21 0446  WBC 16.6* 15.7*  HGB 10.2* 10.8*  HCT 31.5* 33.1*  PLT 541* 585*   BMET Recent Labs    12/08/21 0428 12/09/21 0446  NA 134* 134*  K 3.3* 3.3*  CL 98 93*  CO2 20* 25  GLUCOSE 73 117*  BUN 5* <5*  CREATININE 0.73 0.70  CALCIUM 8.5* 8.7*   PT/INR No results for input(s): "LABPROT", "INR" in the last 72 hours. ABG No results for input(s): "PHART", "HCO3" in the last 72 hours.  Invalid input(s): "PCO2", "PO2"  Studies/Results: DG UGI W SINGLE CM (SOL OR THIN BA)  Result Date: 12/08/2021 CLINICAL DATA:  Status post exploratory laparotomy and Graham patch repair for perforated gastric ulcer 4 days ago. Request upper GI series to evaluate for leak. EXAM: DG UGI W SINGLE CM TECHNIQUE: Scout radiograph was  obtained. Limited single contrast examination was performed using approximately 125 mL Omnipaque-300 via the patient's nasogastric tube. This exam was performed by Brayton El PA-C , and was supervised and interpreted by Dr. Roque Lias. FLUOROSCOPY: Radiation Exposure Index (as provided by the fluoroscopic device): 111.9 mGy Kerma COMPARISON:  None Available. FINDINGS: Scout Radiograph: Nasogastric tube located in the stomach with side-hole just below GE junction. Surgical drain entering from the right side of the abdomen with the tip ending at the expected location of the surgical repair. Stomach: Contrast injected via the nasogastric tube flows freely into the stomach. There is expected flow through the stomach and into the duodenum. Multiple images taken in AP, lateral and oblique projections. No definitive evidence of contrast extravasation or leak seen. Contrast empties from the stomach and flows freely through the duodenum into the jejunum as well. There is small bowel dilatation. IMPRESSION: Limited upper GI evaluation with Omnipaque. No definitive evidence of contrast extravasation or leak seen. Electronically Signed   By: Lupita Raider M.D.   On: 12/08/2021 15:08   DG Abd Portable 1V  Result Date: 12/07/2021 CLINICAL DATA:  NG tube placement. EXAM: PORTABLE ABDOMEN - 1 VIEW COMPARISON:  Chest radiograph 12/06/2021. CTA chest, abdomen, and pelvis 12/04/2021. FINDINGS: An enteric tube remains in place with tip in the region of the proximal gastric body and side port near the GE junction, similar to the prior chest radiograph. Gas is present in multiple grossly nondilated loops of bowel in the included portion of the abdomen. Bilateral pleural effusions and airspace  opacities were more fully evaluated on yesterday's chest radiograph. IMPRESSION: Enteric tube tip in the stomach with side port near the GE junction. Electronically Signed   By: Sebastian Ache M.D.   On: 12/07/2021 15:30     Anti-infectives: Anti-infectives (From admission, onward)    Start     Dose/Rate Route Frequency Ordered Stop   12/04/21 1445  piperacillin-tazobactam (ZOSYN) IVPB 3.375 g  Status:  Discontinued        3.375 g 12.5 mL/hr over 240 Minutes Intravenous Every 8 hours 12/04/21 1357 12/04/21 1434   12/04/21 1200  piperacillin-tazobactam (ZOSYN) IVPB 3.375 g        3.375 g 12.5 mL/hr over 240 Minutes Intravenous Every 8 hours 12/04/21 1008 12/09/21 1159   12/04/21 0530  piperacillin-tazobactam (ZOSYN) IVPB 3.375 g        3.375 g 100 mL/hr over 30 Minutes Intravenous  Once 12/04/21 0517 12/04/21 2353       Assessment/Plan: POD 5 s/p exploratory laparotomy with biopsy of gastric ulcer and Graham patch repair for Perforated distal body posterior wall stomach ulcer by Dr. Luisa Hart on 12/07/21 - Surgical pathology with acute and chronic inflammation - Continue NG tube to LIS to minimize gastric distension - only 250cc/24H with imaging yesterday showing port near GE junction - continue to plan UGI today and will adjust NGT as needed pending impaging - H. Pylori test ordered - Cont PPI BID - Keep JP in place, currently SS - DC PCA, PO pain rx - Continue antibiotics for 5 days postop. Watch wbc - downtrending today 24 > 15.7 - BID WTD to midline wound - Ambulate, PT ordered   FEN: NPO, NGT to LIWS, IVF per TRH. Recommend replacing K (3.3) VTE: Lovenox, SCDs (hgb stable) ID: Zosyn 9/8 - 9/13. Afebrile last 24H. Monitor WBC Foley: None, voiding   - Per primary -  Elevated Tn's - Cardiology following for elevated troponins, thought to be demand ischemia vs cardiomyopathy. Tn now downtrending. Not felt ideal for further ischemic work up and will peruse medical management when taking PO - when able to take PO would start toprol 12.5mg  daily initially followed by further titration of HF medications.  NSVT Asthma/Emphysema - encourage tobacco cessation. Possible pna on imaging covered with abx Pulm  nodule - F/u with primary care for imaging surveillance of pulmonary nodule  LOS: 5 days    Kimberly Terrell 12/09/2021

## 2021-12-09 NOTE — Progress Notes (Signed)
Physical Therapy Treatment Patient Details Name: Kimberly Terrell. Ke MRN: [id number] DOB: 1975-05-21 Today's Date: 12/09/2021   History of Present Illness 46 y/o female presented to ED on 12/03/21 for epigastric pain and N/V for 1.5 days. S/p ex lap for perforated posterior wall stomach ulcer and peritonitis on 9/8. PMH: gastric ulcers, H. pylori infection, anxiety    PT Comments    Pt tolerates treatment well, ambulating for increased distances this session. PT continues to reinforce increased frequency of mobility in an effort to improve activity tolerance and a return to more regular bowel function. Pt will benefit from continued acute PT services and progression to gait training without an assistive device.   Recommendations for follow up therapy are one component of a multi-disciplinary discharge planning process, led by the attending physician.  Recommendations may be updated based on patient status, additional functional criteria and insurance authorization.  Follow Up Recommendations  No PT follow up     Assistance Recommended at Discharge Intermittent Supervision/Assistance  Patient can return home with the following A little help with walking and/or transfers;A little help with bathing/dressing/bathroom;Assistance with cooking/housework;Assist for transportation;Help with stairs or ramp for entrance   Equipment Recommendations  None recommended by PT (hopeful for progression to no needs, still currently benefits from 4 wheeled walker)    Recommendations for Other Services       Precautions / Restrictions Precautions Precautions: Fall Precaution Comments: watch HR and O2; NG tube on suction, abdominal incision, JP drain on R side Restrictions Weight Bearing Restrictions: No     Mobility  Bed Mobility Overal bed mobility: Needs Assistance Bed Mobility: Rolling, Sidelying to Sit Rolling: Supervision Sidelying to sit: Supervision            Transfers Overall transfer  level: Needs assistance Equipment used: Rollator (4 wheels) Transfers: Sit to/from Stand, Bed to chair/wheelchair/BSC Sit to Stand: Supervision Stand pivot transfers: Supervision              Ambulation/Gait Ambulation/Gait assistance: Supervision Gait Distance (Feet): 175 Feet (175' x 2) Assistive device: Rollator (4 wheels) Gait Pattern/deviations: Step-through pattern Gait velocity: functional Gait velocity interpretation: 1.31 - 2.62 ft/sec, indicative of limited community ambulator   General Gait Details: pt with steady step-through Development worker, community    Modified Rankin (Stroke Patients Only)       Balance Overall balance assessment: Needs assistance Sitting-balance support: Feet supported, No upper extremity supported Sitting balance-Leahy Scale: Good     Standing balance support: Single extremity supported, Reliant on assistive device for balance Standing balance-Leahy Scale: Poor                              Cognition Arousal/Alertness: Awake/alert Behavior During Therapy: WFL for tasks assessed/performed Overall Cognitive Status: Within Functional Limits for tasks assessed                                          Exercises      General Comments General comments (skin integrity, edema, etc.): pt on 4L [place] upon PT arrival, weaned to room air with desat to 88% when mobilizing. PT places pt back on 2L [place] at end of session with sats in mid 90s. Pt tachycardic into 140s with mobility, sustained in 130s  Pertinent Vitals/Pain Pain Assessment Pain Assessment: 0-10 Pain Score: 4  Pain Location: abdomen Pain Descriptors / Indicators: Sore Pain Intervention(s): Monitored during session    Home Living                          Prior Function            PT Goals (current goals can now be found in the care plan section) Acute Rehab PT Goals Patient Stated Goal: to get  better Progress towards PT goals: Progressing toward goals    Frequency    Min 3X/week      PT Plan Current plan remains appropriate    Co-evaluation              AM-PAC PT "6 Clicks" Mobility   Outcome Measure  Help needed turning from your back to your side while in a flat bed without using bedrails?: A Little Help needed moving from lying on your back to sitting on the side of a flat bed without using bedrails?: A Little Help needed moving to and from a bed to a chair (including a wheelchair)?: A Little Help needed standing up from a chair using your arms (e.g., wheelchair or bedside chair)?: A Little Help needed to walk in hospital room?: A Little Help needed climbing 3-5 steps with a railing? : A Little 6 Click Score: 18    End of Session Equipment Utilized During Treatment: Oxygen Activity Tolerance: Patient tolerated treatment well Patient left: in chair;with call bell/phone within reach Nurse Communication: Mobility status PT Visit Diagnosis: Unsteadiness on feet (R26.81);Muscle weakness (generalized) (M62.81)     Time: 5784-6962 PT Time Calculation (min) (ACUTE ONLY): 34 min  Charges:  $Gait Training: 8-22 mins $Therapeutic Activity: 8-22 mins                     Arlyss Gandy, PT, DPT Acute Rehabilitation Office [phone]    Arlyss Gandy 12/09/2021, 10:51 AM

## 2021-12-09 NOTE — Progress Notes (Signed)
Wasted Hydromorphone 50 ml.in the [employer]. Waste witnessed by Emelda Brothers , Charity fundraiser.

## 2021-12-09 NOTE — Progress Notes (Addendum)
Rounding Note    Patient Name: Kimberly Terrell. [name] Date of Encounter: 12/09/2021  [facility] Cardiologist: Charlton Haws, MD   Subjective   NG tube is out, had UGI series yesterday. Was able to drink some liquids, but vomited about 1:30 am Still coughing at times, not as much  Inpatient Medications    Scheduled Meds:  diclofenac Sodium  2 g Topical QID   enoxaparin (LOVENOX) injection  40 mg Subcutaneous Q24H   feeding supplement  1 Container Oral TID BM   HYDROmorphone   Intravenous Q4H   metoprolol tartrate  5 mg Intravenous Q8H   neomycin-bacitracin-polymyxin   Topical Daily   nicotine  14 mg Transdermal Daily   mouth rinse  15 mL Mouth Rinse 4 times per day   pantoprazole (PROTONIX) IV  40 mg Intravenous Q12H   Continuous Infusions:  piperacillin-tazobactam 3.375 g (12/09/21 0404)   potassium chloride     PRN Meds: acetaminophen, albuterol, diphenhydrAMINE **OR** diphenhydrAMINE, metoprolol tartrate, naloxone **AND** sodium chloride flush, ondansetron **OR** ondansetron (ZOFRAN) IV, phenol, zolpidem   Vital Signs    Vitals:   12/08/21 2349 12/09/21 0039 12/09/21 0403 12/09/21 0423  BP:  122/77  128/79  Pulse:  (!) 102    Resp: 18 19 16 20   Temp:  98.6 F (37 C)  97.9 F (36.6 C)  TempSrc:  Oral  Oral  SpO2: 98% 98% 98%   Weight:      Height:        Intake/Output Summary (Last 24 hours) at 12/09/2021 0801 Last data filed at 12/09/2021 0655 Gross per 24 hour  Intake 200 ml  Output 355 ml  Net -155 ml      12/06/2021    2:49 AM 12/04/2021   10:20 AM 09/23/2021    1:32 PM  Last 3 Weights  Weight (lbs) 146 lb 8 oz 140 lb 145 lb  Weight (kg) 66.452 kg 63.504 kg 65.772 kg      Telemetry    ST - Personally Reviewed  ECG    No new - Personally Reviewed  Physical Exam   GEN: No acute distress.  Neck: JVD 9-10 cm at 30 degrees Cardiac: RRR, no murmurs, rubs, or gallops.  Respiratory: rales bases and some scattered rhonchi to auscultation  bilaterally. GI: firm, tender, non-distended , JP drain MS: No edema; No deformity. Neuro:  Nonfocal  Psych: Normal affect   Labs    High Sensitivity Troponin:   Recent Labs  Lab 12/04/21 2155 12/05/21 0034 12/05/21 0832 12/05/21 1035 12/06/21 0701  TROPONINIHS 7,608* 9,474* 10,518* 9,799* 6,061*     Chemistry Recent Labs  Lab 12/03/21 2200 12/05/21 0034 12/06/21 0014 12/06/21 2322 12/07/21 0337 12/08/21 0428 12/09/21 0446  NA 138 135   < >  --  135 134* 134*  K 3.4* 3.9   < >  --  3.1* 3.3* 3.3*  CL 104 104   < >  --  99 98 93*  CO2 19* 23   < >  --  28 20* 25  GLUCOSE 122* 142*   < >  --  92 73 117*  BUN 12 15   < >  --  <5* 5* <5*  CREATININE 0.78 0.91   < >  --  0.70 0.73 0.70  CALCIUM 9.7 7.6*   < >  --  8.4* 8.5* 8.7*  MG  --   --   --  1.7  --  1.9 1.9  PROT 6.2*  4.7*  --   --   --   --   --   ALBUMIN 3.3* 2.1*  --   --   --   --   --   AST 21 70*  --   --   --   --   --   ALT 18 26  --   --   --   --   --   ALKPHOS 93 49  --   --   --   --   --   BILITOT 1.1 1.0  --   --   --   --   --   GFRNONAA >60 >60   < >  --  >60 >60 >60  ANIONGAP 15 8   < >  --  8 16* 16*   < > = values in this interval not displayed.    Lipids No results for input(s): "CHOL", "TRIG", "HDL", "LABVLDL", "LDLCALC", "CHOLHDL" in the last 168 hours.  Hematology Recent Labs  Lab 12/07/21 0337 12/08/21 0428 12/09/21 0446  WBC 24.7* 16.6* 15.7*  RBC 3.74* 3.81* 4.09  HGB 10.1* 10.2* 10.8*  HCT 30.9* 31.5* 33.1*  MCV 82.6 82.7 80.9  MCH 27.0 26.8 26.4  MCHC 32.7 32.4 32.6  RDW 15.1 15.4 15.2  PLT 457* 541* 585*   Thyroid No results for input(s): "TSH", "FREET4" in the last 168 hours.  BNPNo results for input(s): "BNP", "PROBNP" in the last 168 hours.  DDimer No results for input(s): "DDIMER" in the last 168 hours.   Radiology    DG UGI W SINGLE CM (SOL OR THIN BA)  Result Date: 12/08/2021 CLINICAL DATA:  Status post exploratory laparotomy and Graham patch repair for  perforated gastric ulcer 4 days ago. Request upper GI series to evaluate for leak. EXAM: DG UGI W SINGLE CM TECHNIQUE: Scout radiograph was obtained. Limited single contrast examination was performed using approximately 125 mL Omnipaque-300 via the patient's nasogastric tube. This exam was performed by Brayton El PA-C , and was supervised and interpreted by Dr. Roque Lias. FLUOROSCOPY: Radiation Exposure Index (as provided by the fluoroscopic device): 111.9 mGy Kerma COMPARISON:  None Available. FINDINGS: Scout Radiograph: Nasogastric tube located in the stomach with side-hole just below GE junction. Surgical drain entering from the right side of the abdomen with the tip ending at the expected location of the surgical repair. Stomach: Contrast injected via the nasogastric tube flows freely into the stomach. There is expected flow through the stomach and into the duodenum. Multiple images taken in AP, lateral and oblique projections. No definitive evidence of contrast extravasation or leak seen. Contrast empties from the stomach and flows freely through the duodenum into the jejunum as well. There is small bowel dilatation. IMPRESSION: Limited upper GI evaluation with Omnipaque. No definitive evidence of contrast extravasation or leak seen. Electronically Signed   By: Lupita Raider M.D.   On: 12/08/2021 15:08   DG Abd Portable 1V  Result Date: 12/07/2021 CLINICAL DATA:  NG tube placement. EXAM: PORTABLE ABDOMEN - 1 VIEW COMPARISON:  Chest radiograph 12/06/2021. CTA chest, abdomen, and pelvis 12/04/2021. FINDINGS: An enteric tube remains in place with tip in the region of the proximal gastric body and side port near the GE junction, similar to the prior chest radiograph. Gas is present in multiple grossly nondilated loops of bowel in the included portion of the abdomen. Bilateral pleural effusions and airspace opacities were more fully evaluated on yesterday's chest radiograph. IMPRESSION: Enteric tube tip  in the stomach with side port near the GE junction. Electronically Signed   By: Sebastian Ache M.D.   On: 12/07/2021 15:30    Cardiac Studies   Echo 12/04/21 IMPRESSIONS   1. Distal septal/ apical/ inferior basal hypokinesis No obvious mural  thrombus but would consider repeating TTE after emergent surgery for  perforated gastric ulcer when HR lower and images better . Left  ventricular ejection fraction, by estimation, is  40 to 45%. The left ventricle has mildly decreased function. The left  ventricle has no regional wall motion abnormalities. Left ventricular  diastolic parameters are indeterminate.   2. Right ventricular systolic function is mildly reduced. The right  ventricular size is mildly enlarged. There is normal pulmonary artery  systolic pressure.   3. The mitral valve is normal in structure. No evidence of mitral valve  regurgitation. No evidence of mitral stenosis.   4. The aortic valve is tricuspid. Aortic valve regurgitation is not  visualized. No aortic stenosis is present.   5. The inferior vena cava is normal in size with greater than 50%  respiratory variability, suggesting right atrial pressure of 3 mmHg.   FINDINGS   Left Ventricle: Distal septal/ apical/ inferior basal hypokinesis No  obvious mural thrombus but would consider repeating TTE after emergent  surgery for perforated gastric ulcer when HR lower and images better. Left  ventricular ejection fraction, by  estimation, is 40 to 45%. The left ventricle has mildly decreased  function. The left ventricle has no regional wall motion abnormalities.  Definity contrast agent was given IV to delineate the left ventricular  endocardial borders. The left ventricular  internal cavity size was normal in size. There is no left ventricular  hypertrophy. Left ventricular diastolic parameters are indeterminate.   Right Ventricle: The right ventricular size is mildly enlarged. Right  vetricular wall thickness was not  assessed. Right ventricular systolic  function is mildly reduced. There is normal pulmonary artery systolic  pressure. The tricuspid regurgitant  velocity is 2.01 m/s, and with an assumed right atrial pressure of 3 mmHg,  the estimated right ventricular systolic pressure is 19.2 mmHg.   Left Atrium: Left atrial size was normal in size.   Right Atrium: Right atrial size was normal in size.   Pericardium: There is no evidence of pericardial effusion.   Mitral Valve: The mitral valve is normal in structure. No evidence of  mitral valve regurgitation. No evidence of mitral valve stenosis.   Tricuspid Valve: The tricuspid valve is normal in structure. Tricuspid  valve regurgitation is mild . No evidence of tricuspid stenosis.   Aortic Valve: The aortic valve is tricuspid. Aortic valve regurgitation is  not visualized. No aortic stenosis is present.   Pulmonic Valve: The pulmonic valve was normal in structure. Pulmonic valve  regurgitation is not visualized. No evidence of pulmonic stenosis.   Aorta: The aortic root is normal in size and structure.   Venous: The inferior vena cava is normal in size with greater than 50%  respiratory variability, suggesting right atrial pressure of 3 mmHg.   IAS/Shunts: No atrial level shunt detected by color flow Doppler.       Patient Profile     46 y.o. female hx of anxiety, depression, sinus tachycardia, and H. pylori treated x2 admitted 12/03/21 with abd pain, N&V, URI, and tachycardia with HR to 160s.  Did develop chest pain in ER. Pain resolved after fluid resuscitation and improved HR.  Elevated troponin.   Assessment & Plan  Elevated troponinNSTEMI  --in setting of tachycardia, peritenonitis with perforated peptic ulcer --troponin pk 10,518 now trending down, on 09/10 was 6,061 --echo with decrease in EF from 2021 60-65% to 40-45% Distal septal/ apical/ inferior basal hypokinesis No obvious mural thrombus but would consider repeating TTE  after emergent surgery for perforated gastric ulcer when HR lower and images better. RV systolic function mildly reduced. RV size is mildly enlarged, valves normal and no pericardial effusion.   -- HR still elevated so will not order limited echo yet - from initial evaluation thought to be global ischemia/demand from severe systemic illness, was not a cath candidate given perforated bowel with emergent surgery - suspect most likely stress induced CM but cannot exclude ischemic event -poor cath candidate given recent surgery, high risk to commit to DAPT given recent perforated ulcer. She also remains npo with NG tube  --given recent surgery, lack of symptoms, trending down troponin, progressing anemia, have not heparanized. Holding on antiplatel agent for same reason currently.  ---when able to take PO would start toprol 12.5mg  daily initially followed by further titration of HF medications.  - would treat medically and repeat echo 3 months, if ongoing dysfunction at that time depending on her overall clinical status could consider ischemic testing at that time.    Peritonitis with perforated peptic ulcer - ex lap 12/04/21, Perforated distal body posterior wall stomach ulcer with 1.5 L of gastric contents abdominal cavity and peritonitis - s/p closure of posterior gastric wall ulcer  --no fever during the night - NG tube out, but had some vomiting overnight, ?2nd contrast given -- progressing anemia s/p transfusion and improved, follow per IM/CCS   NSVT short bursts  -- BP has improved, ectopy has improved also -- on IV metop 5 q 8 hr, change to XL when dose is stable -- keep K+ > 4, Mg > 2.0 -- Mg low at 1.7, s/p IV supp, now 1.9, repeat IV MgSO4  Hypokalemia/ hypo Mg+   -- s/p replacement and improved but K+ still low >> IM has ordered po supplement, not sure she can keep it down, will repeat IV rx and hold po supp for now  For questions or updates, please contact [facility] Please consult [email or web address] for contact info under   Signed, Theodore Demark, PA-C  12/09/2021, 8:01 AM    Patient seen and examined, note reviewed with the signed Advanced Practice Provider. I personally reviewed laboratory data, imaging studies and relevant notes. I independently examined the patient and formulated the important aspects of the plan. I have personally discussed the plan with the patient and/or family. Comments or changes to the note/plan are indicated below.  Patient was seen and examined at her bedside. She is now with a NG tube - really good progress.  Please transition her lopressor to 12.5 mg twice a day.  Will give one dose of IV lasix today.  Will continue to follow with you.   Thomasene Ripple DO, MS [facility] Attending Cardiologist [facility]  [address] [place], [place] [address] [phone] Website: [email or web address]

## 2021-12-10 DIAGNOSIS — K631 Perforation of intestine (nontraumatic): Secondary | ICD-10-CM | POA: Diagnosis not present

## 2021-12-10 DIAGNOSIS — R112 Nausea with vomiting, unspecified: Secondary | ICD-10-CM | POA: Diagnosis not present

## 2021-12-10 DIAGNOSIS — R198 Other specified symptoms and signs involving the digestive system and abdomen: Secondary | ICD-10-CM | POA: Diagnosis not present

## 2021-12-10 DIAGNOSIS — R5082 Postprocedural fever: Secondary | ICD-10-CM | POA: Diagnosis not present

## 2021-12-10 LAB — CBC
HCT: 29.8 % — ABNORMAL LOW (ref 36.0–46.0)
Hemoglobin: 9.8 g/dL — ABNORMAL LOW (ref 12.0–15.0)
MCH: 26.8 pg (ref 26.0–34.0)
MCHC: 32.9 g/dL (ref 30.0–36.0)
MCV: 81.6 fL (ref 80.0–100.0)
Platelets: 563 10*3/uL — ABNORMAL HIGH (ref 150–400)
RBC: 3.65 MIL/uL — ABNORMAL LOW (ref 3.87–5.11)
RDW: 15.3 % (ref 11.5–15.5)
WBC: 16.6 10*3/uL — ABNORMAL HIGH (ref 4.0–10.5)
nRBC: 0 % (ref 0.0–0.2)

## 2021-12-10 LAB — BASIC METABOLIC PANEL
Anion gap: 13 (ref 5–15)
BUN: 5 mg/dL — ABNORMAL LOW (ref 6–20)
CO2: 26 mmol/L (ref 22–32)
Calcium: 8.5 mg/dL — ABNORMAL LOW (ref 8.9–10.3)
Chloride: 95 mmol/L — ABNORMAL LOW (ref 98–111)
Creatinine, Ser: 0.65 mg/dL (ref 0.44–1.00)
GFR, Estimated: >60 mL/min (ref >60)
Glucose, Bld: 90 mg/dL (ref 70–99)
Potassium: 3.3 mmol/L — ABNORMAL LOW (ref 3.5–5.1)
Sodium: 134 mmol/L — ABNORMAL LOW (ref 135–145)

## 2021-12-10 LAB — FOLATE: Folate: 6.5 ng/mL (ref >5.9)

## 2021-12-10 LAB — IGA: IgA: 100 mg/dL (ref 87–352)

## 2021-12-10 MED ORDER — METOPROLOL TARTRATE 25 MG PO TABS: 25.0000 mg | ORAL_TABLET | Freq: Two times a day (BID) | ORAL | Status: DC

## 2021-12-10 MED ORDER — OXYCODONE HCL 5 MG PO TABS
5.0000 mg | ORAL_TABLET | ORAL | Status: DC | PRN
  Administered 2021-12-10 – 2021-12-11 (×2): 10 mg via ORAL
  Administered 2021-12-11 – 2021-12-13 (×6): 5 mg via ORAL
  Administered 2021-12-14 (×2): 10 mg via ORAL
  Administered 2021-12-15: 5 mg via ORAL
  Filled 2021-12-10: qty 2
  Filled 2021-12-10: qty 1
  Filled 2021-12-10: qty 2
  Filled 2021-12-10 (×2): qty 1
  Filled 2021-12-10 (×3): qty 2
  Filled 2021-12-10 (×3): qty 1

## 2021-12-10 MED ORDER — HYDROMORPHONE HCL 1 MG/ML IJ SOLN
0.5000 mg | INTRAMUSCULAR | Status: DC | PRN
  Administered 2021-12-10 – 2021-12-14 (×13): 0.5 mg via INTRAVENOUS
  Filled 2021-12-10 (×13): qty 1

## 2021-12-10 MED ORDER — POTASSIUM CHLORIDE CRYS ER 20 MEQ PO TBCR
40.0000 meq | EXTENDED_RELEASE_TABLET | Freq: Three times a day (TID) | ORAL | Status: AC
  Administered 2021-12-10 (×3): 40 meq via ORAL
  Filled 2021-12-10 (×3): qty 2

## 2021-12-10 MED ORDER — METOPROLOL SUCCINATE ER 25 MG PO TB24
25.0000 mg | ORAL_TABLET | Freq: Every day | ORAL | Status: DC
  Administered 2021-12-10 – 2021-12-15 (×6): 25 mg via ORAL
  Filled 2021-12-10 (×7): qty 1

## 2021-12-10 MED ORDER — SACUBITRIL-VALSARTAN 24-26 MG PO TABS
1.0000 | ORAL_TABLET | Freq: Two times a day (BID) | ORAL | Status: DC
  Administered 2021-12-10 – 2021-12-14 (×9): 1 via ORAL
  Filled 2021-12-10 (×9): qty 1

## 2021-12-10 MED ORDER — POTASSIUM CHLORIDE CRYS ER 20 MEQ PO TBCR: 40.0000 meq | EXTENDED_RELEASE_TABLET | Freq: Two times a day (BID) | ORAL | Status: DC

## 2021-12-10 MED ORDER — PROSOURCE PLUS PO LIQD
30.0000 mL | Freq: Two times a day (BID) | ORAL | Status: DC
  Administered 2021-12-11 – 2021-12-14 (×6): 30 mL via ORAL
  Filled 2021-12-10 (×7): qty 30

## 2021-12-10 MED ORDER — ACETAMINOPHEN 500 MG PO TABS
1000.0000 mg | ORAL_TABLET | Freq: Four times a day (QID) | ORAL | Status: DC
  Administered 2021-12-10 – 2021-12-15 (×17): 1000 mg via ORAL
  Filled 2021-12-10 (×20): qty 2

## 2021-12-10 MED ORDER — METHOCARBAMOL 1000 MG/10ML IJ SOLN
500.0000 mg | Freq: Four times a day (QID) | INTRAVENOUS | Status: DC | PRN
  Filled 2021-12-10: qty 5

## 2021-12-10 MED ORDER — ADULT MULTIVITAMIN W/MINERALS CH
1.0000 | ORAL_TABLET | Freq: Every day | ORAL | Status: DC
  Administered 2021-12-13 – 2021-12-15 (×3): 1 via ORAL
  Filled 2021-12-10 (×5): qty 1

## 2021-12-10 NOTE — Progress Notes (Signed)
6 Days Post-Op   Subjective/Chief Complaint: Still with nausea/emesis - only tried small amount of fulls (sips of boost) otherwise just drank clears. Pain is stable. Having loose bowel movements which is not abnormal for her over the last couple months (is trying to see GI for this)   Objective: Vital signs in last 24 hours: Temp:  [97.8 F (36.6 C)-98.1 F (36.7 C)] 97.8 F (36.6 C) (09/14 0824) Pulse Rate:  [100-103] 100 (09/14 0824) Resp:  [15-18] 15 (09/14 0824) BP: (122-127)/(64-69) 127/69 (09/14 0824) SpO2:  [95 %-96 %] 96 % (09/14 0824) Last BM Date : 12/09/21  Intake/Output from previous day: 09/13 0701 - 09/14 0700 In: 1453.4 [P.O.:400; I.V.:1003.4; IV Piggyback:50] Out: 20 [Drains:20] Intake/Output this shift: No intake/output data recorded.  PE:  Constitutional: No acute distress, conversant, appears stated age. Eyes: Anicteric sclerae, moist conjunctiva, no lid lag Lungs:  normal respiratory effort GI: Soft, no masses or hepatosplenomegaly, appropriate TTP at incision with is c/d/I. Drain ss Skin: No rashes, palpation reveals normal turgor Psychiatric: appropriate judgment and insight, oriented to person, place, and time   Lab Results:  Recent Labs    12/09/21 0446 12/10/21 0242  WBC 15.7* 16.6*  HGB 10.8* 9.8*  HCT 33.1* 29.8*  PLT 585* 563*    BMET Recent Labs    12/09/21 0446 12/10/21 0242  NA 134* 134*  K 3.3* 3.3*  CL 93* 95*  CO2 25 26  GLUCOSE 117* 90  BUN <5* 5*  CREATININE 0.70 0.65  CALCIUM 8.7* 8.5*    PT/INR No results for input(s): "LABPROT", "INR" in the last 72 hours. ABG No results for input(s): "PHART", "HCO3" in the last 72 hours.  Invalid input(s): "PCO2", "PO2"  Studies/Results: DG UGI W SINGLE CM (SOL OR THIN BA)  Result Date: 12/08/2021 CLINICAL DATA:  Status post exploratory laparotomy and Graham patch repair for perforated gastric ulcer 4 days ago. Request upper GI series to evaluate for leak. EXAM: DG UGI W  SINGLE CM TECHNIQUE: Scout radiograph was obtained. Limited single contrast examination was performed using approximately 125 mL Omnipaque-300 via the patient's nasogastric tube. This exam was performed by Brayton El PA-C , and was supervised and interpreted by Dr. Roque Lias. FLUOROSCOPY: Radiation Exposure Index (as provided by the fluoroscopic device): 111.9 mGy Kerma COMPARISON:  None Available. FINDINGS: Scout Radiograph: Nasogastric tube located in the stomach with side-hole just below GE junction. Surgical drain entering from the right side of the abdomen with the tip ending at the expected location of the surgical repair. Stomach: Contrast injected via the nasogastric tube flows freely into the stomach. There is expected flow through the stomach and into the duodenum. Multiple images taken in AP, lateral and oblique projections. No definitive evidence of contrast extravasation or leak seen. Contrast empties from the stomach and flows freely through the duodenum into the jejunum as well. There is small bowel dilatation. IMPRESSION: Limited upper GI evaluation with Omnipaque. No definitive evidence of contrast extravasation or leak seen. Electronically Signed   By: Lupita Raider M.D.   On: 12/08/2021 15:08    Anti-infectives: Anti-infectives (From admission, onward)    Start     Dose/Rate Route Frequency Ordered Stop   12/04/21 1445  piperacillin-tazobactam (ZOSYN) IVPB 3.375 g  Status:  Discontinued        3.375 g 12.5 mL/hr over 240 Minutes Intravenous Every 8 hours 12/04/21 1357 12/04/21 1434   12/04/21 1200  piperacillin-tazobactam (ZOSYN) IVPB 3.375 g  3.375 g 12.5 mL/hr over 240 Minutes Intravenous Every 8 hours 12/04/21 1008 12/09/21 1159   12/04/21 0530  piperacillin-tazobactam (ZOSYN) IVPB 3.375 g        3.375 g 100 mL/hr over 30 Minutes Intravenous  Once 12/04/21 0517 12/04/21 3716       Assessment/Plan: POD 6 s/p exploratory laparotomy with biopsy of gastric ulcer and  Graham patch repair for Perforated distal body posterior wall stomach ulcer by Dr. Luisa Hart on 12/07/21 - Surgical pathology with acute and chronic inflammation - UGI negative. NGT out - H. Pylori negative - Cont PPI BID - Keep JP in place, currently SS - DC PCA, PO pain rx - completed antibiotics for 5 days postop. Watch wbc - overall downtrending but up to 16.6 (15.7) - BID WTD to midline wound - Ambulate, PT ordered  Continue to trial FLD today   FEN:FLD, IVF per [facility]. Recommend replacing K (3.3) VTE: Lovenox, SCDs (hgb stable) ID: Zosyn 9/8 - 9/13. Afebrile last 24H. Monitor WBC Foley: None, voiding   - Per primary -  Elevated Tn's - Cardiology following for elevated troponins, thought to be demand ischemia vs cardiomyopathy. Tn now downtrending. Not felt ideal for further ischemic work up and will peruse medical management when taking PO - when able to take PO would start toprol 12.5mg  daily initially followed by further titration of HF medications.  NSVT Asthma/Emphysema - encourage tobacco cessation. Possible pna on imaging covered with abx Pulm nodule - F/u with primary care for imaging surveillance of pulmonary nodule  LOS: 6 days    Eric Form, [facility] Surgery 12/10/2021, 8:58 AM Please see Amion for pager number during day hours 7:00am-4:30pm

## 2021-12-10 NOTE — Progress Notes (Addendum)
Rounding Note    Patient Name: Kimberly Terrell. Kimberly Terrell Date of Encounter: 12/10/2021  [facility] Cardiologist: Charlton Haws, MD   Subjective   Patient seen and examined at her bedside. Now taking po  Inpatient Medications    Scheduled Meds:  enoxaparin (LOVENOX) injection  40 mg Subcutaneous Q24H   feeding supplement  1 Container Oral TID BM   metoprolol tartrate  12.5 mg Oral BID   nicotine  14 mg Transdermal Daily   mouth rinse  15 mL Mouth Rinse 4 times per day   pantoprazole  40 mg Oral Daily   potassium chloride  40 mEq Oral TID   Continuous Infusions:   PRN Meds: acetaminophen, albuterol, diclofenac Sodium, neomycin-bacitracin-polymyxin, ondansetron **OR** ondansetron (ZOFRAN) IV, oxyCODONE-acetaminophen, phenol, prochlorperazine, simethicone, zolpidem   Vital Signs    Vitals:   12/09/21 0805 12/09/21 1506 12/09/21 2217 12/10/21 0824  BP: 120/78 122/69 122/64 127/69  Pulse: (!) 105 (!) 102 (!) 103 100  Resp: 19 18 16 15   Temp: 97.8 F (36.6 C) 98.1 F (36.7 C) 97.8 F (36.6 C) 97.8 F (36.6 C)  TempSrc: Oral Oral Oral Oral  SpO2: 98% 96% 95% 96%  Weight:      Height:        Intake/Output Summary (Last 24 hours) at 12/10/2021 0905 Last data filed at 12/10/2021 0458 Gross per 24 hour  Intake 1453.35 ml  Output 20 ml  Net 1433.35 ml      12/06/2021    2:49 AM 12/04/2021   10:20 AM 09/23/2021    1:32 PM  Last 3 Weights  Weight (lbs) 146 lb 8 oz 140 lb 145 lb  Weight (kg) 66.452 kg 63.504 kg 65.772 kg      Telemetry    ST - Personally Reviewed  ECG    No new - Personally Reviewed  Physical Exam   GEN: No acute distress.  Neck: JVD 9-10 cm at 30 degrees Cardiac: RRR, no murmurs, rubs, or gallops.  Respiratory: rales bases and some scattered rhonchi to auscultation bilaterally. GI: firm, tender, non-distended , JP drain MS: No edema; No deformity. Neuro:  Nonfocal  Psych: Normal affect   Labs    High Sensitivity Troponin:    Recent Labs  Lab 12/04/21 2155 12/05/21 0034 12/05/21 0832 12/05/21 1035 12/06/21 0701  TROPONINIHS 7,608* 9,474* 10,518* 9,799* 6,061*     Chemistry Recent Labs  Lab 12/03/21 2200 12/05/21 0034 12/06/21 0014 12/06/21 2322 12/07/21 02/06/22 12/08/21 0428 12/09/21 0446 12/10/21 0242  NA 138 135   < >  --    < > 134* 134* 134*  K 3.4* 3.9   < >  --    < > 3.3* 3.3* 3.3*  CL 104 104   < >  --    < > 98 93* 95*  CO2 19* 23   < >  --    < > 20* 25 26  GLUCOSE 122* 142*   < >  --    < > 73 117* 90  BUN 12 15   < >  --    < > 5* <5* 5*  CREATININE 0.78 0.91   < >  --    < > 0.73 0.70 0.65  CALCIUM 9.7 7.6*   < >  --    < > 8.5* 8.7* 8.5*  MG  --   --   --  1.7  --  1.9 1.9  --   PROT 6.2* 4.7*  --   --   --   --   --   --  ALBUMIN 3.3* 2.1*  --   --   --   --   --   --   AST 21 70*  --   --   --   --   --   --   ALT 18 26  --   --   --   --   --   --   ALKPHOS 93 49  --   --   --   --   --   --   BILITOT 1.1 1.0  --   --   --   --   --   --   GFRNONAA >60 >60   < >  --    < > >60 >60 >60  ANIONGAP 15 8   < >  --    < > 16* 16* 13   < > = values in this interval not displayed.    Lipids No results for input(s): "CHOL", "TRIG", "HDL", "LABVLDL", "LDLCALC", "CHOLHDL" in the last 168 hours.  Hematology Recent Labs  Lab 12/08/21 0428 12/09/21 0446 12/10/21 0242  WBC 16.6* 15.7* 16.6*  RBC 3.81* 4.09 3.65*  HGB 10.2* 10.8* 9.8*  HCT 31.5* 33.1* 29.8*  MCV 82.7 80.9 81.6  MCH 26.8 26.4 26.8  MCHC 32.4 32.6 32.9  RDW 15.4 15.2 15.3  PLT 541* 585* 563*   Thyroid No results for input(s): "TSH", "FREET4" in the last 168 hours.  BNPNo results for input(s): "BNP", "PROBNP" in the last 168 hours.  DDimer No results for input(s): "DDIMER" in the last 168 hours.   Radiology    DG UGI W SINGLE CM (SOL OR THIN BA)  Result Date: 12/08/2021 CLINICAL DATA:  Status post exploratory laparotomy and Graham patch repair for perforated gastric ulcer 4 days ago. Request upper GI series to  evaluate for leak. EXAM: DG UGI W SINGLE CM TECHNIQUE: Scout radiograph was obtained. Limited single contrast examination was performed using approximately 125 mL Omnipaque-300 via the patient's nasogastric tube. This exam was performed by Brayton El PA-C , and was supervised and interpreted by Dr. Roque Lias. FLUOROSCOPY: Radiation Exposure Index (as provided by the fluoroscopic device): 111.9 mGy Kerma COMPARISON:  None Available. FINDINGS: Scout Radiograph: Nasogastric tube located in the stomach with side-hole just below GE junction. Surgical drain entering from the right side of the abdomen with the tip ending at the expected location of the surgical repair. Stomach: Contrast injected via the nasogastric tube flows freely into the stomach. There is expected flow through the stomach and into the duodenum. Multiple images taken in AP, lateral and oblique projections. No definitive evidence of contrast extravasation or leak seen. Contrast empties from the stomach and flows freely through the duodenum into the jejunum as well. There is small bowel dilatation. IMPRESSION: Limited upper GI evaluation with Omnipaque. No definitive evidence of contrast extravasation or leak seen. Electronically Signed   By: Lupita Raider M.D.   On: 12/08/2021 15:08    Cardiac Studies   Echo 12/04/21 IMPRESSIONS   1. Distal septal/ apical/ inferior basal hypokinesis No obvious mural  thrombus but would consider repeating TTE after emergent surgery for  perforated gastric ulcer when HR lower and images better . Left  ventricular ejection fraction, by estimation, is  40 to 45%. The left ventricle has mildly decreased function. The left  ventricle has no regional wall motion abnormalities. Left ventricular  diastolic parameters are indeterminate.   2. Right ventricular systolic function is mildly reduced. The right  ventricular size is mildly enlarged. There is normal pulmonary artery  systolic pressure.   3. The mitral  valve is normal in structure. No evidence of mitral valve  regurgitation. No evidence of mitral stenosis.   4. The aortic valve is tricuspid. Aortic valve regurgitation is not  visualized. No aortic stenosis is present.   5. The inferior vena cava is normal in size with greater than 50%  respiratory variability, suggesting right atrial pressure of 3 mmHg.   FINDINGS   Left Ventricle: Distal septal/ apical/ inferior basal hypokinesis No  obvious mural thrombus but would consider repeating TTE after emergent  surgery for perforated gastric ulcer when HR lower and images better. Left  ventricular ejection fraction, by  estimation, is 40 to 45%. The left ventricle has mildly decreased  function. The left ventricle has no regional wall motion abnormalities.  Definity contrast agent was given IV to delineate the left ventricular  endocardial borders. The left ventricular  internal cavity size was normal in size. There is no left ventricular  hypertrophy. Left ventricular diastolic parameters are indeterminate.   Right Ventricle: The right ventricular size is mildly enlarged. Right  vetricular wall thickness was not assessed. Right ventricular systolic  function is mildly reduced. There is normal pulmonary artery systolic  pressure. The tricuspid regurgitant  velocity is 2.01 m/s, and with an assumed right atrial pressure of 3 mmHg,  the estimated right ventricular systolic pressure is 19.2 mmHg.   Left Atrium: Left atrial size was normal in size.   Right Atrium: Right atrial size was normal in size.   Pericardium: There is no evidence of pericardial effusion.   Mitral Valve: The mitral valve is normal in structure. No evidence of  mitral valve regurgitation. No evidence of mitral valve stenosis.   Tricuspid Valve: The tricuspid valve is normal in structure. Tricuspid  valve regurgitation is mild . No evidence of tricuspid stenosis.   Aortic Valve: The aortic valve is tricuspid. Aortic  valve regurgitation is  not visualized. No aortic stenosis is present.   Pulmonic Valve: The pulmonic valve was normal in structure. Pulmonic valve  regurgitation is not visualized. No evidence of pulmonic stenosis.   Aorta: The aortic root is normal in size and structure.   Venous: The inferior vena cava is normal in size with greater than 50%  respiratory variability, suggesting right atrial pressure of 3 mmHg.   IAS/Shunts: No atrial level shunt detected by color flow Doppler.       Patient Profile     46 y.o. female hx of anxiety, depression, sinus tachycardia, and H. pylori treated x2 admitted 12/03/21 with abd pain, N&V, URI, and tachycardia with HR to 160s.  Did develop chest pain in ER. Pain resolved after fluid resuscitation and improved HR.  Elevated troponin.   Assessment & Plan    Elevated troponinNSTEMI  -in setting of tachycardia, peritenonitis with perforated peptic ulcer --troponin pk 10,518 now trending down, on 09/10 was 6,061 --echo with decrease in EF from 2021 60-65% to 40-45% Distal septal/ apical/ inferior basal hypokinesis No obvious mural thrombus but would consider repeating TTE after emergent surgery for perforated gastric ulcer when HR lower and images better. RV systolic function mildly reduced. RV size is mildly enlarged, valves normal and no pericardial effusion.   -- HR still elevated so will not order limited echo yet - from initial evaluation thought to be global ischemia/demand from severe systemic illness, was not a cath candidate given perforated bowel with emergent surgery -  suspect most likely stress induced CM but cannot exclude ischemic event -poor cath candidate given recent surgery, high risk to commit to DAPT given recent perforated ulcer. She also remains npo with NG tube  --given recent surgery, lack of symptoms, trending down troponin, progressing anemia, have not heparanized. Holding on antiplatel agent for same reason currently.  -PO  lopressor started, will add moderate intensity stat Crestor 10 mg daily.   Dilated cardiomyopathy - on lopressor will transition to toprol xl once off NG tube. Plan to add entresto today. Will cautiously introduce guideline medical therapy.  would treat medically and repeat prior to discharge, then echo 3 months, if ongoing dysfunction at that time depending on her overall clinical status could consider ischemic testing at that time.    Peritonitis with perforated peptic ulcer - ex lap 12/04/21, Perforated distal body posterior wall stomach ulcer with 1.5 L of gastric contents abdominal cavity and peritonitis - s/p closure of posterior gastric wall ulcer , management per surgery.   NSVT short bursts  -- BP has improved, ectopy has improved also -- will change to toprol xl -- keep K+ > 4, Mg > 2.0   Hypokalemia/ hypo Mg+   - keep mag >2 and K > 4  For questions or updates, please contact [facility] Please consult [email or web address] for contact info under   Signed, Anayiah Howden, DO  12/10/2021, 9:05 AM

## 2021-12-10 NOTE — Progress Notes (Signed)
Subjective:  Patient had an episode of nausea and vomiting yesterday evening but none overnight. Patient denies any abdominal pain, fever, or chills overnight. States she has still been passing flatus. Has been experiencing some intermittent diarrhea since yesterday afternoon. Discussed that diarrhea could be secondary to recent antibiotics. Discussed that it is a good sign that her bowels are moving. She states that she has not attempted to eat overnight due to fear of vomiting. Discussed need to attempt clear liquid diet today to see if her body is able to tolerate.  Discussed that we will control her nausea and pain with IV medications. Discussed plan to replete her potassium today as well as pending  work up for celiac disease.   Objective:  Vital signs in last 24 hours: Vitals:   12/09/21 1506 12/09/21 2217 12/10/21 0824 12/10/21 1003  BP: 122/69 122/64 127/69   Pulse: (!) 102 (!) 103 100   Resp: 18 16 15    Temp: 98.1 F (36.7 C) 97.8 F (36.6 C) 97.8 F (36.6 C)   TempSrc: Oral Oral Oral   SpO2: 96% 95% 96% 94%  Weight:      Height:       Weight change:   Intake/Output Summary (Last 24 hours) at 12/10/2021 1030 Last data filed at 12/10/2021 0900 Gross per 24 hour  Intake 1178.35 ml  Output 20 ml  Net 1158.35 ml   General: uncomfortable appearing, resting in bed Cardiovascular: tachycardic, regular rhythm. No murmurs, rubs, or gallops. Gastrointestinal: abdomen is distended and tympanic. Minimal diffuse abdominal tenderness.  Neurological: Alert and oriented to person, place, and time. Non focal  Skin: Skin is warm and dry. No erythema around surgical bandage on abdomen Psych: appears sad  Assessment/Plan:  Principal Problem:   Perforated viscus Active Problems:   Gastric ulcer  Ms. Kaiyana Bedore is 46 yo female with a PMH of anxiety, arthritis, asthma (well controlled), h/o H pylori and PUD who presents with acutely worsened abdominal pain and admitted for  perforated gastric ulcer.  #Perforated viscus  #Post-op Fever #Diarrhea #Vomiting Had episodes of diarrhea and vomiting yesterday afternoon and evening. No vomiting overnight. Is not complaining of abdominal pain today. Abdominal tenderness appears improved from yesterday. Leukocytosis mildly worsened today but remains afebrile. Blood cultures negative to date. Hgb stable at 9.8 today. Suspect that diarrhea could be secondary to recent antibiotic use. Although patient does have long-standing history of diarrhea with workup by GI. Has never had workup for celiac disease. Will continue treating her pain and managing her nausea and vomiting. Will continue to encourage PO intake on full liquid diet and reassess.  > Continue ambulation and use of incentive spirometer > Boost feeding supplement > PO Protonix > IV Zofran PRN and IV compazine PRN for nausea > Tylenol PRN, IV Diluadid 0.5 mg, PO Oxycodone 5-10 mg for pain > Simethicone for flatulence > Tissue transglutaminase IgA pending > Total IgA pending  #Acute Heart Failure with mildly reduced EF #Elevated troponins and diffuse ST elevations #Vtach Asymptomatic Vtach on 9/9. Continues to be tachycardic. Elevated trop likely secondary to stress induced cardiomyopathy from perforated gastric ulcer. Would likely benefit from GDMT. Cardiology would like to start [facility]. Cardiology will not order repeat echo yet due to tachycardia.  > Hold antiplatelets  >Toprol 12.5 mg  > Entresto 24-26 mg  > Repeat echo in 3 months > Cardiac monitoring  # Hypokalemia # Hypomagnesemia Still hypokalemic today. Patient not tolerating IV potassium. Will try again today with oral  potassium.  > replete with oral potassium  > trend BMP  #Asthma  # Incidental Pulmonary Nodule # Tobacco use Has smoking history. CT shows 4 mm Left UL pulmonary nodule. Satting well on 3L this morning  > Home Albuterol  > Nicotine patch  > PFTs as outpatient  > Recommend  imaging surveillance of pulmonary nodule as outpatient   #Arthritis  Chronic.  > Rheumatology f/u as outpatient > Avoiding NSAIDs (for perforated ulcer) > Methocarbamol 500 mg  > Voltaren gel  Diet: Clear liquid Bowel: none VTE: Lovenox IVF: none Code: Full code   Prior to Admission Living Arrangement: home w/ grandchildren Anticipated Discharge Location: home w/ grandchildren Barriers to Discharge: continued management Dispo: Anticipated discharge in approximately more than 2 day(s).    LOS: 6 days   Erin Hearing I, Medical Student 12/10/2021, 10:30 AM

## 2021-12-10 NOTE — Progress Notes (Signed)
Mobility Specialist Criteria Algorithm Info.   12/10/21 1041  Mobility  Activity Refused mobility   Patient declined mobility for reasons that are unclear to writer. Stated she just took dilaudid which I explained it would be best to ambulate while premedicated, but she still declined. Will f/u as time permits.  [place] Rick Warnick, CMS, BS EXP [facility]  Phone:[phone] Office: [phone]

## 2021-12-10 NOTE — Plan of Care (Signed)

## 2021-12-10 NOTE — Progress Notes (Signed)
Initial Nutrition Assessment  DOCUMENTATION CODES:   Not applicable  INTERVENTION:   -Boost Breeze po TID, each supplement provides 250 kcal and 9 grams of protein  -30 ml Prosource Plus BID, each supplement provides 100 kcals and 15 grams protein -MVI with minerals daily -Monitor labs for potential deficiencies due to ongoing diarrhea: zinc, niacin, folate, and vitamin A  NUTRITION DIAGNOSIS:   Inadequate oral intake related to altered GI function, decreased appetite as evidenced by per patient/family report.  GOAL:   Patient will meet greater than or equal to 90% of their needs  MONITOR:   PO intake, Supplement acceptance, Diet advancement  REASON FOR ASSESSMENT:   Consult Assessment of nutrition requirement/status  ASSESSMENT:   Pt with a PMH of anxiety, arthritis, asthma (well controlled), h/o H pylori and PUD who presents with abdominal pain.  Pt admitted with perforated viscus.   9/8- s/p Exploratory laparotomy with closure of posterior gastric wall ulcer and biopsy of ulcer  Reviewed I/O's: +1.4 L x 24 hours and -2.7 L since admission  UOP: 300 ml x 24 hours  Spoke with pt and husband at bedside, who reports feeling better "for now". Pt shares that she is tolerating clear liquids, however, drank only small amounts of juice and smart water today. Per pt, she tried to drink about 4 ounces of Ensure yesterday and vomited.   Pt reports good appetite PTA and generally consumes 3 meals per day. Per pt, he has been experiencing constant diarrhea over the past 3 months. Per pt and husband, nothing makes the diarrhea better or worse. Pt shares "eating anything from water to tacos and everything in between" will exacerbate symptoms. Pt husband reports they have been waiting to see GI as an outpatient.   Pt reports a 10# weight loss over the past 3 months. Her UBW is around 184#. Reviewed wt hx; wt has been stable over the past 2 months.   Discussed importance of good meal  and supplement intake to promote healing. Pt amenable to supplements. Pt amenable to try Boost Breeze.   Given prolonged diarrhea, will check vitamin labs to check for potential deficiencies.   Labs reviewed: Na: 134, K: 3.3.     NUTRITION - FOCUSED PHYSICAL EXAM:  Flowsheet Row Most Recent Value  Orbital Region No depletion  Upper Arm Region No depletion  Thoracic and Lumbar Region No depletion  Buccal Region No depletion  Clavicle Bone Region Mild depletion  Clavicle and Acromion Bone Region No depletion  Scapular Bone Region No depletion  Dorsal Hand No depletion  Patellar Region No depletion  Anterior Thigh Region No depletion  Posterior Calf Region No depletion  Edema (RD Assessment) None  Hair Reviewed  Eyes Reviewed  Mouth Reviewed  Skin Reviewed  Nails Reviewed       Diet Order:   Diet Order             Diet full liquid Room service appropriate? Yes; Fluid consistency: Thin  Diet effective now                   EDUCATION NEEDS:   Education needs have been addressed  Skin:  Skin Assessment: Skin Integrity Issues: Skin Integrity Issues:: Incisions Incisions: closed abdomen  Last BM:  12/09/21 (type 7)  Height:   Ht Readings from Last 1 Encounters:  12/04/21 5\' 3"  (1.6 m)    Weight:   Wt Readings from Last 1 Encounters:  12/06/21 66.5 kg    Ideal Body  Weight:  52.3 kg  BMI:  Body mass index is 25.95 kg/m.  Estimated Nutritional Needs:   Kcal:  1800-2000  Protein:  100-115 grams  Fluid:  > 1.8 L    Levada Schilling, RD, LDN, CDCES Registered Dietitian II Certified Diabetes Care and Education Specialist Please refer to [facility] for RD and/or RD on-call/weekend/after hours pager

## 2021-12-11 ENCOUNTER — Inpatient Hospital Stay (HOSPITAL_COMMUNITY)

## 2021-12-11 DIAGNOSIS — K259 Gastric ulcer, unspecified as acute or chronic, without hemorrhage or perforation: Secondary | ICD-10-CM

## 2021-12-11 DIAGNOSIS — K631 Perforation of intestine (nontraumatic): Secondary | ICD-10-CM | POA: Diagnosis not present

## 2021-12-11 DIAGNOSIS — R1110 Vomiting, unspecified: Secondary | ICD-10-CM

## 2021-12-11 DIAGNOSIS — R198 Other specified symptoms and signs involving the digestive system and abdomen: Secondary | ICD-10-CM | POA: Diagnosis not present

## 2021-12-11 DIAGNOSIS — R197 Diarrhea, unspecified: Secondary | ICD-10-CM

## 2021-12-11 DIAGNOSIS — R5082 Postprocedural fever: Secondary | ICD-10-CM | POA: Diagnosis not present

## 2021-12-11 LAB — CBC
HCT: 30.5 % — ABNORMAL LOW (ref 36.0–46.0)
Hemoglobin: 10.1 g/dL — ABNORMAL LOW (ref 12.0–15.0)
MCH: 26.7 pg (ref 26.0–34.0)
MCHC: 33.1 g/dL (ref 30.0–36.0)
MCV: 80.7 fL (ref 80.0–100.0)
Platelets: 647 10*3/uL — ABNORMAL HIGH (ref 150–400)
RBC: 3.78 MIL/uL — ABNORMAL LOW (ref 3.87–5.11)
RDW: 15.4 % (ref 11.5–15.5)
WBC: 21.3 10*3/uL — ABNORMAL HIGH (ref 4.0–10.5)
nRBC: 0 % (ref 0.0–0.2)

## 2021-12-11 LAB — BASIC METABOLIC PANEL
Anion gap: 12 (ref 5–15)
BUN: 5 mg/dL — ABNORMAL LOW (ref 6–20)
CO2: 24 mmol/L (ref 22–32)
Calcium: 8.5 mg/dL — ABNORMAL LOW (ref 8.9–10.3)
Chloride: 97 mmol/L — ABNORMAL LOW (ref 98–111)
Creatinine, Ser: 0.57 mg/dL (ref 0.44–1.00)
GFR, Estimated: >60 mL/min (ref >60)
Glucose, Bld: 98 mg/dL (ref 70–99)
Potassium: 3.9 mmol/L (ref 3.5–5.1)
Sodium: 133 mmol/L — ABNORMAL LOW (ref 135–145)

## 2021-12-11 LAB — TISSUE TRANSGLUTAMINASE, IGA: Tissue Transglutaminase Ab, IgA: <2 U/mL (ref 0–3)

## 2021-12-11 MED ORDER — DM-GUAIFENESIN ER 30-600 MG PO TB12
1.0000 | ORAL_TABLET | Freq: Two times a day (BID) | ORAL | Status: DC
  Administered 2021-12-12 – 2021-12-15 (×8): 1 via ORAL
  Filled 2021-12-11 (×11): qty 1

## 2021-12-11 MED ORDER — IOHEXOL 350 MG/ML SOLN
75.0000 mL | Freq: Once | INTRAVENOUS | Status: AC | PRN
  Administered 2021-12-11: 75 mL via INTRAVENOUS

## 2021-12-11 MED ORDER — PIPERACILLIN-TAZOBACTAM 3.375 G IVPB
3.3750 g | Freq: Three times a day (TID) | INTRAVENOUS | Status: DC
  Administered 2021-12-11 – 2021-12-15 (×12): 3.375 g via INTRAVENOUS
  Filled 2021-12-11 (×13): qty 50

## 2021-12-11 MED ORDER — ORAL CARE MOUTH RINSE: 15.0000 mL | OROMUCOSAL | Status: DC | PRN

## 2021-12-11 NOTE — Progress Notes (Addendum)
Rounding Note    Patient Name: Kimberly Terrell. [name] Date of Encounter: 12/11/2021  [place] Cardiologist: Jenkins Rouge, MD   Subjective   Patient states she is feeling ok, has pain from her surgical site. She states her heart rate is normally a little faster. She denied any chest pain, dizziness, syncope, leg swelling,  now or prior to this admission. She had no cardiac disease in the past, denied family hx of MI. She is a smoker and but is willing to quit now. She is started on liquid diet since yesterday, tolerating well.   Inpatient Medications    Scheduled Meds:  (feeding supplement) PROSource Plus  30 mL Oral BID BM   acetaminophen  1,000 mg Oral Q6H   dextromethorphan-guaiFENesin  1 tablet Oral BID   enoxaparin (LOVENOX) injection  40 mg Subcutaneous Q24H   feeding supplement  1 Container Oral TID BM   metoprolol succinate  25 mg Oral Daily   multivitamin with minerals  1 tablet Oral Daily   nicotine  14 mg Transdermal Daily   mouth rinse  15 mL Mouth Rinse 4 times per day   pantoprazole  40 mg Oral Daily   sacubitril-valsartan  1 tablet Oral BID   Continuous Infusions:  methocarbamol (ROBAXIN) IV     PRN Meds: albuterol, diclofenac Sodium, HYDROmorphone (DILAUDID) injection, methocarbamol (ROBAXIN) IV, neomycin-bacitracin-polymyxin, ondansetron **OR** ondansetron (ZOFRAN) IV, oxyCODONE, phenol, prochlorperazine, simethicone, zolpidem   Vital Signs    Vitals:   12/10/21 1229 12/10/21 1649 12/11/21 0433 12/11/21 0751  BP: 122/68 103/67 110/64   Pulse: (!) 111 100 100 100  Resp: 15 16 20 20   Temp: 98.1 F (36.7 C) 98.4 F (36.9 C) 98 F (36.7 C) 98.5 F (36.9 C)  TempSrc: Oral Oral Oral Oral  SpO2: 97% 99% 98%   Weight:      Height:        Intake/Output Summary (Last 24 hours) at 12/11/2021 0942 Last data filed at 12/10/2021 2132 Gross per 24 hour  Intake 360 ml  Output 455 ml  Net -95 ml      12/06/2021    2:49 AM 12/04/2021   10:20 AM  09/23/2021    1:32 PM  Last 3 Weights  Weight (lbs) 146 lb 8 oz 140 lb 145 lb  Weight (kg) 66.452 kg 63.504 kg 65.772 kg      Telemetry    Sinus tachycardia 100-130s  - Personally Reviewed  ECG    N/A today - Personally Reviewed  Physical Exam   GEN: No acute distress.   Neck: No JVD Cardiac: Regular rhythm tachycardiac, no murmurs, rubs, or gallops.  Respiratory: Clear to auscultation bilaterally. On [place] oxygen GI: Soft, tender MS: No leg edema; No deformity. Neuro:  Nonfocal  Psych: Normal affect   Labs    High Sensitivity Troponin:   Recent Labs  Lab 12/04/21 2155 12/05/21 0034 12/05/21 0832 12/05/21 1035 12/06/21 0701  TROPONINIHS 7,608* 9,474* 10,518* 9,799* 6,061*     Chemistry Recent Labs  Lab 12/05/21 0034 12/06/21 0014 12/06/21 2322 12/07/21 [id number] 12/08/21 0428 12/09/21 0446 12/10/21 0242 12/11/21 0702  NA 135   < >  --    < > 134* 134* 134* 133*  K 3.9   < >  --    < > 3.3* 3.3* 3.3* 3.9  CL 104   < >  --    < > 98 93* 95* 97*  CO2 23   < >  --    < >  20* 25 26 24   GLUCOSE 142*   < >  --    < > 73 117* 90 98  BUN 15   < >  --    < > 5* <5* 5* 5*  CREATININE 0.91   < >  --    < > 0.73 0.70 0.65 0.57  CALCIUM 7.6*   < >  --    < > 8.5* 8.7* 8.5* 8.5*  MG  --   --  1.7  --  1.9 1.9  --   --   PROT 4.7*  --   --   --   --   --   --   --   ALBUMIN 2.1*  --   --   --   --   --   --   --   AST 70*  --   --   --   --   --   --   --   ALT 26  --   --   --   --   --   --   --   ALKPHOS 49  --   --   --   --   --   --   --   BILITOT 1.0  --   --   --   --   --   --   --   GFRNONAA >60   < >  --    < > >60 >60 >60 >60  ANIONGAP 8   < >  --    < > 16* 16* 13 12   < > = values in this interval not displayed.    Lipids No results for input(s): "CHOL", "TRIG", "HDL", "LABVLDL", "LDLCALC", "CHOLHDL" in the last 168 hours.  Hematology Recent Labs  Lab 12/09/21 0446 12/10/21 0242 12/11/21 0702  WBC 15.7* 16.6* 21.3*  RBC 4.09 3.65* 3.78*  HGB 10.8*  9.8* 10.1*  HCT 33.1* 29.8* 30.5*  MCV 80.9 81.6 80.7  MCH 26.4 26.8 26.7  MCHC 32.6 32.9 33.1  RDW 15.2 15.3 15.4  PLT 585* 563* 647*   Thyroid No results for input(s): "TSH", "FREET4" in the last 168 hours.  BNPNo results for input(s): "BNP", "PROBNP" in the last 168 hours.  DDimer No results for input(s): "DDIMER" in the last 168 hours.   Radiology    No results found.  Cardiac Studies   Echo from 12/04/21:   1. Distal septal/ apical/ inferior basal hypokinesis No obvious mural  thrombus but would consider repeating TTE after emergent surgery for  perforated gastric ulcer when HR lower and images better . Left  ventricular ejection fraction, by estimation, is  40 to 45%. The left ventricle has mildly decreased function. The left  ventricle has no regional wall motion abnormalities. Left ventricular  diastolic parameters are indeterminate.   2. Right ventricular systolic function is mildly reduced. The right  ventricular size is mildly enlarged. There is normal pulmonary artery  systolic pressure.   3. The mitral valve is normal in structure. No evidence of mitral valve  regurgitation. No evidence of mitral stenosis.   4. The aortic valve is tricuspid. Aortic valve regurgitation is not  visualized. No aortic stenosis is present.   5. The inferior vena cava is normal in size with greater than 50%  respiratory variability, suggesting right atrial pressure of 3 mmHg.  Patient Profile     46 y.o. female with PMH of H. Pylori infection,PUD, GERD, anxiety,  depression, asthma, tobacco use, who is admitted for peritonitis due to perforated distal body posterior wall gastric ulcer, underwent emergent exploratory laparotomy with closure of posterior gastric wall ulcer and biopsy of ulcer on 12/04/21. Cardiology is consulted and following for elevated trop peaked 71245 with initial EKG showing STE of II,III,aVF, V4-V6, as well as Echo showing LVEF 40-45%, mild RV dysfunction. Distal septal/  apical/ inferior basal hypokinesis.   Assessment & Plan    NSTEMI - in the setting of peritenonitis with perforated peptic ulcer requiring emergent surgery  - Hs trop peaked 10,508 - EKG initially 9/8 had STE of II,III,aVF, V4-V6, repeat EKG 9/9 had resolved ST changes  - no significant atherosclerotic disease noted on non-coronary CTA torso at admission - Echo LVEF 40-45% (was 60-65% in 2021), mild RV dysfunction. Distal septal/ apical/ inferior basal hypokinesis. 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  - Echo as above - No CHF symptoms prior to current admission - currently remains hypoxic, can be multifactorial due to post-op atelectasis + CHF, not overly hypervolemic  - can use PRN Lasix when hypoxia worsen with edema, rapid weight gain - GDMT: Toprol 25mg  XL, started entresto 24/26 BID, may add SGLT2I before discharge   NSVT  - resolved, continue Toprol XL 25mg  daily, keep K >4 and Mag >2   Peritonitis with perforated peptic ulcer Asthma Tobacco use Anxiety with depression Anemia  - per primary team  For questions or updates, please contact [facility] Please consult [email or web address] for contact info under    Signed, , NP  12/11/2021, 9:42 AM    Patient seen and examined, note reviewed with the signed Advanced Practice Provider. I personally reviewed laboratory data, imaging studies and relevant notes. I independently examined the patient and formulated the important aspects of the plan. I have  personally discussed the plan with the patient and/or family. Comments or changes to the note/plan are indicated below.  I am very happy for Kimberly Terrell as she has had tremendous improvement since her procedure.  Clinically she is improving.  In terms of her cardiovascular findings her EF was depressed which was new with elevated troponin which trended down.  Her initial clinical picture was suspected for likely Takotsubo cardiomyopathy versus cardiac injury.  Unfortunately given her clinical status and recent significant surgery she is a poor candidate to pursue any ischemic evaluation, also high risk for bleeding on dual antiplatelet therapy.  She has been managed favorably on low-dose beta-blocker and I started her Entresto yesterday which she has tolerated.  She could benefit from SGLT2 inhibitors.  A repeat echocardiogram will be beneficial prior to discharge.  She also had short runs of ectopy earlier this week and the low-dose beta-blocker has really helped resolve this with repeating her electrolytes as well.  12/13/2021 DO, MS [facility] Attending Cardiologist [facility]  [address] [place], [address] [place] [phone] Website: [email or web address]

## 2021-12-11 NOTE — Progress Notes (Signed)
Physical Therapy Treatment Patient Details Name: Kimberly Terrell. Kimberly Terrell MRN: [id number] DOB: [birth year] Today's Date: 12/11/2021   History of Present Illness 46 y/o female presented to ED on 12/03/21 for epigastric pain and N/V for 1.5 days. S/p ex lap for perforated posterior wall stomach ulcer and peritonitis on 9/8. PMH: gastric ulcers, H. pylori infection, anxiety    PT Comments    Pt continues to incr mobility and should be able to return home when medically ready.    Recommendations for follow up therapy are one component of a multi-disciplinary discharge planning process, led by the attending physician.  Recommendations may be updated based on patient status, additional functional criteria and insurance authorization.  Follow Up Recommendations  No PT follow up     Assistance Recommended at Discharge Intermittent Supervision/Assistance  Patient can return home with the following A little help with walking and/or transfers;A little help with bathing/dressing/bathroom;Assistance with cooking/housework;Assist for transportation;Help with stairs or ramp for entrance   Equipment Recommendations  None recommended by PT (hopeful for progression to no needs, still currently benefits from 4 wheeled walker)    Recommendations for Other Services       Precautions / Restrictions Precautions Precautions: Fall Precaution Comments: watch HR and O2;  JP drain on R side Restrictions Weight Bearing Restrictions: No     Mobility  Bed Mobility Overal bed mobility: Modified Independent     Sidelying to sit: Modified independent (Device/Increase time)     Sit to sidelying: Modified independent (Device/Increase time)      Transfers Overall transfer level: Modified independent Equipment used: None Transfers: Sit to/from Stand Sit to Stand: Modified independent (Device/Increase time)                Ambulation/Gait Ambulation/Gait assistance: Supervision Gait Distance (Feet): 470  Feet Assistive device: Rollator (4 wheels) Gait Pattern/deviations: Step-through pattern Gait velocity: functional Gait velocity interpretation: 1.31 - 2.62 ft/sec, indicative of limited community ambulator   General Gait Details: pt with steady step-through Development worker, community    Modified Rankin (Stroke Patients Only)       Balance Overall balance assessment: Mild deficits observed, not formally tested                                          Cognition Arousal/Alertness: Awake/alert Behavior During Therapy: WFL for tasks assessed/performed Overall Cognitive Status: Within Functional Limits for tasks assessed                                          Exercises      General Comments General comments (skin integrity, edema, etc.): SpO2 93% on RA with activity. HR to 130 with amb      Pertinent Vitals/Pain Pain Assessment Pain Assessment: Faces Faces Pain Scale: Hurts little more Pain Location: abdomen Pain Descriptors / Indicators: Sore Pain Intervention(s): Limited activity within patient's tolerance    Home Living Family/patient expects to be discharged to:: Private residence Living Arrangements: Spouse/significant other;Children;Other (Comment)                      Prior Function            PT Goals (current goals can  now be found in the care plan section) Acute Rehab PT Goals Patient Stated Goal: to get better Progress towards PT goals: Progressing toward goals    Frequency    Min 3X/week      PT Plan Current plan remains appropriate    Co-evaluation              AM-PAC PT "6 Clicks" Mobility   Outcome Measure  Help needed turning from your back to your side while in a flat bed without using bedrails?: None Help needed moving from lying on your back to sitting on the side of a flat bed without using bedrails?: None Help needed moving to and from a bed to a chair  (including a wheelchair)?: None Help needed standing up from a chair using your arms (e.g., wheelchair or bedside chair)?: None Help needed to walk in hospital room?: A Little Help needed climbing 3-5 steps with a railing? : A Little 6 Click Score: 22    End of Session   Activity Tolerance: Patient tolerated treatment well Patient left: with call bell/phone within reach;in bed Nurse Communication: Mobility status PT Visit Diagnosis: Unsteadiness on feet (R26.81);Muscle weakness (generalized) (M62.81)     Time: 5102-5852 PT Time Calculation (min) (ACUTE ONLY): 18 min  Charges:  $Gait Training: 8-22 mins                     [facility] PT Acute Rehabilitation Services Office [phone]    Angelina Ok [facility] 12/11/2021, 11:49 AM

## 2021-12-11 NOTE — Progress Notes (Signed)
Subjective:   Patient states that her abdominal pain is a little worse from yesterday. She has not had nausea or vomiting since the evening of 9/13. She is tolerating clear liquids. Patient continues to have diarrhea and states 8-10 episodes yesterday. States that her diarrhea is green without blood. States that her breathing feels good today but still has intermittent cough. Her sputum is clear. Discussed plan to continue using incentive spirometer and to get up and walk today. Discussed plan to speak with surgery today regarding getting imaging of her abdomen.  Objective:  Vital signs in last 24 hours: Vitals:   12/10/21 1003 12/10/21 1229 12/10/21 1649 12/11/21 0433  BP:  122/68 103/67 110/64  Pulse:  (!) 111 100 100  Resp:  15 16 20   Temp:  98.1 F (36.7 C) 98.4 F (36.9 C) 98 F (36.7 C)  TempSrc:  Oral Oral Oral  SpO2: 94% 97% 99% 98%  Weight:      Height:       Physical Exam: General: resting in bed, talkative Cardiovascular: tachycardic, regular rhythm. No murmurs, rubs, or gallops. Pulmonary: no crackles, wheezes, rales, or rhonchi.  Gastrointestinal: abdomen is distended and tympanic. Minimal abdominal tenderness near surgical site. Serous fluid in her JP drain.  Neurological: Alert and oriented to person, place, and time. Non focal  Skin: Skin is warm and dry. Surgical site without erythema or drainage.  Psych: appears anxious  Assessment/Plan:  Principal Problem:   Perforated viscus Active Problems:   Gastric ulcer  Kimberly Terrell is 46 y/o female with a PMH of anxiety, arthritis, asthma (well controlled), h/o H pylori and PUD who presents with acutely worsened abdominal pain and admitted for perforated gastric ulcer.   #Perforated viscus  #Post-op Fever #Vomiting No nausea and vomiting yesterday or overnight. Patient states her abdominal pain is slightly worse today but abdominal exam is unchanged. Hgb stable today at 10.1. Leukocytosis worsened to 21.3  today. Remains afebrile. Blood cultures remain negative. Spoke with surgery today about obtaining abdominal CT given worsening leukocytosis. Surgery agrees with the plan for CT. Will continue to control her pain and n/v. Encouraging PO intake.  > Soft diet as tolerated > Boost feeding supplement > PO Protonix > IV Zofran PRN and IV compazine PRN for nausea > Tylenol PRN, IV Diluadid 0.5 mg, PO Oxycodone 5-10 mg for pain > Abdominal CT   #Acute Heart Failure with mildly reduced EF #Elevated troponins and diffuse ST elevations #Vtach Episode of asymptomatic Vtach on 9/9. Remains tachycardic. Suspect elevated trops during this hospitalization were secondary to stress induced cardiomyopathy from perforated gastric ulcer. Cardiology may add SGLT2I prior to discharge.  > Hold antiplatelets  >Toprol 12.5 mg  > Entresto 24-26 mg  > Repeat echo in 3 months > Cardiac monitoring  #Diarrhea Has chronic diarrhea history. Continues to have several episodes of diarrhea. Diarrhea may be related to recent antibiotic use. Less concern for infectious cause given chronic history. No previous workup for celiac disease. Total IgA WNL. > Simethicone for flatulence > Tissue transglutaminase IgA pending    # Hypokalemia # Hypomagnesemia Hypomagnesemia has resolved. Potassium normalized today.  > trend BMP   #Asthma  #Cough # Incidental Pulmonary Nodule # Tobacco use Hx of asthma. Smoking hx. CT shows 4 mm Left UL pulmonary nodule. Continues to have intermittent cough but no new oxygen requirements overnight. Lungs without crackles or wheezes today. Satting well on 1L this morning. Asthma could be contributing to her cough.  > Ambulation  and use of incentive spirometer > Home Albuterol  > Mucinex  > Nicotine patch  > PFTs as outpatient  > Recommend imaging surveillance of pulmonary nodule as outpatient    #Arthritis  Chronic.  > Rheumatology f/u as outpatient > Avoiding NSAIDs (for perforated  ulcer) > Methocarbamol 500 mg  > Voltaren gel   Diet: Soft Bowel: none VTE: Lovenox IVF: none Code: Full code   Prior to Admission Living Arrangement: home w/ grandchildren Anticipated Discharge Location: home w/ grandchildren Barriers to Discharge: continued management Dispo: Anticipated discharge in approximately more than 2 day(s).    Karoline Caldwell, MD 12/11/2021, 6:09 AM Pager: [phone] After 5pm on weekdays and 1pm on weekends: On Call pager [phone]

## 2021-12-11 NOTE — Progress Notes (Signed)
7 Days Post-Op   Subjective/Chief Complaint: Nausea/emesis resolved. Tolerated fulls yesterday. Ambulating. Having pain with dressing changes. Still with diarrhea  Objective: Vital signs in last 24 hours: Temp:  [97.8 F (36.6 C)-98.5 F (36.9 C)] 98.5 F (36.9 C) (09/15 0751) Pulse Rate:  [100-111] 100 (09/15 0751) Resp:  [15-20] 20 (09/15 0751) BP: (103-127)/(64-69) 110/64 (09/15 0433) SpO2:  [94 %-99 %] 98 % (09/15 0433) Last BM Date : 12/11/21  Intake/Output from previous day: 09/14 0701 - 09/15 0700 In: 385 [P.O.:385] Out: 455 [Urine:400; Drains:55] Intake/Output this shift: No intake/output data recorded.  PE:  Constitutional: No acute distress, conversant, appears stated age. Eyes: Anicteric sclerae, moist conjunctiva, no lid lag Lungs:  normal respiratory effort GI: Soft, no masses or hepatosplenomegaly, appropriate TTP at incision which is granulating, no purulent drainage - see below. Drain serous - 55cc/24H Skin: No rashes, palpation reveals normal turgor Psychiatric: appropriate judgment and insight, oriented to person, place, and time     Lab Results:  Recent Labs    12/10/21 0242 12/11/21 0702  WBC 16.6* 21.3*  HGB 9.8* 10.1*  HCT 29.8* 30.5*  PLT 563* 647*    BMET Recent Labs    12/10/21 0242 12/11/21 0702  NA 134* 133*  K 3.3* 3.9  CL 95* 97*  CO2 26 24  GLUCOSE 90 98  BUN 5* 5*  CREATININE 0.65 0.57  CALCIUM 8.5* 8.5*    PT/INR No results for input(s): "LABPROT", "INR" in the last 72 hours. ABG No results for input(s): "PHART", "HCO3" in the last 72 hours.  Invalid input(s): "PCO2", "PO2"  Studies/Results: No results found.  Anti-infectives: Anti-infectives (From admission, onward)    Start     Dose/Rate Route Frequency Ordered Stop   12/04/21 1445  piperacillin-tazobactam (ZOSYN) IVPB 3.375 g  Status:  Discontinued        3.375 g 12.5 mL/hr over 240 Minutes Intravenous Every 8 hours 12/04/21 1357 12/04/21 1434   12/04/21  1200  piperacillin-tazobactam (ZOSYN) IVPB 3.375 g        3.375 g 12.5 mL/hr over 240 Minutes Intravenous Every 8 hours 12/04/21 1008 12/09/21 1159   12/04/21 0530  piperacillin-tazobactam (ZOSYN) IVPB 3.375 g        3.375 g 100 mL/hr over 30 Minutes Intravenous  Once 12/04/21 0517 12/04/21 3267       Assessment/Plan: POD 7 s/p exploratory laparotomy with biopsy of gastric ulcer and Graham patch repair for Perforated distal body posterior wall stomach ulcer by Dr. Luisa Hart on 12/07/21 - Surgical pathology with acute and chronic inflammation - UGI negative. NGT out - H. Pylori negative - Cont PPI BID - Keep JP in place, currently serous  - tolerating FLD - advance to soft - completed antibiotics for 5 days postop. Watch wbc - up to 21.3 (16.6). afebrile. Monitor WBC and fever curve and consider CT scan soon if WBC not improving - will discuss with MD - BID WTD to midline wound - Ambulate, PT ordered   FEN: soft, IVF per medicine VTE: Lovenox, SCDs (hgb stable) ID: Zosyn 9/8 - 9/13. Afebrile last 24H. Monitor WBC Foley: None, voiding   - Per primary -  Elevated Tn's - Cardiology following for elevated troponins, thought to be demand ischemia vs cardiomyopathy. Tn now downtrending. Not felt ideal for further ischemic work up and will peruse medical management when taking PO - when able to take PO would start toprol 12.5mg  daily initially followed by further titration of HF medications.  NSVT Asthma/Emphysema -  encourage tobacco cessation. Possible pna on imaging covered with abx Pulm nodule - F/u with primary care for imaging surveillance of pulmonary nodule   LOS: 7 days    Eric Form, [facility] 12/11/2021, 8:15 AM Please see Amion for pager number during day hours 7:00am-4:30pm

## 2021-12-12 DIAGNOSIS — I248 Other forms of acute ischemic heart disease: Secondary | ICD-10-CM

## 2021-12-12 DIAGNOSIS — I2489 Other forms of acute ischemic heart disease: Secondary | ICD-10-CM

## 2021-12-12 DIAGNOSIS — K631 Perforation of intestine (nontraumatic): Secondary | ICD-10-CM | POA: Diagnosis not present

## 2021-12-12 DIAGNOSIS — R5082 Postprocedural fever: Secondary | ICD-10-CM | POA: Diagnosis not present

## 2021-12-12 DIAGNOSIS — K651 Peritoneal abscess: Secondary | ICD-10-CM

## 2021-12-12 DIAGNOSIS — K259 Gastric ulcer, unspecified as acute or chronic, without hemorrhage or perforation: Secondary | ICD-10-CM | POA: Diagnosis not present

## 2021-12-12 DIAGNOSIS — R198 Other specified symptoms and signs involving the digestive system and abdomen: Secondary | ICD-10-CM | POA: Diagnosis not present

## 2021-12-12 DIAGNOSIS — R1110 Vomiting, unspecified: Secondary | ICD-10-CM | POA: Diagnosis not present

## 2021-12-12 LAB — BASIC METABOLIC PANEL
Anion gap: 13 (ref 5–15)
BUN: 5 mg/dL — ABNORMAL LOW (ref 6–20)
CO2: 24 mmol/L (ref 22–32)
Calcium: 8.6 mg/dL — ABNORMAL LOW (ref 8.9–10.3)
Chloride: 94 mmol/L — ABNORMAL LOW (ref 98–111)
Creatinine, Ser: 0.57 mg/dL (ref 0.44–1.00)
GFR, Estimated: >60 mL/min (ref >60)
Glucose, Bld: 109 mg/dL — ABNORMAL HIGH (ref 70–99)
Potassium: 3.3 mmol/L — ABNORMAL LOW (ref 3.5–5.1)
Sodium: 131 mmol/L — ABNORMAL LOW (ref 135–145)

## 2021-12-12 LAB — CULTURE, BLOOD (ROUTINE X 2)
Culture: NO GROWTH
Culture: NO GROWTH
Special Requests: ADEQUATE
Special Requests: ADEQUATE

## 2021-12-12 LAB — LIPID PANEL
Cholesterol: 133 mg/dL (ref 0–200)
HDL: 17 mg/dL — ABNORMAL LOW (ref >40)
LDL Cholesterol: 82 mg/dL (ref 0–99)
Total CHOL/HDL Ratio: 7.8 RATIO
Triglycerides: 171 mg/dL — ABNORMAL HIGH (ref <150)
VLDL: 34 mg/dL (ref 0–40)

## 2021-12-12 LAB — CBC
HCT: 31.7 % — ABNORMAL LOW (ref 36.0–46.0)
Hemoglobin: 10.3 g/dL — ABNORMAL LOW (ref 12.0–15.0)
MCH: 26.3 pg (ref 26.0–34.0)
MCHC: 32.5 g/dL (ref 30.0–36.0)
MCV: 80.9 fL (ref 80.0–100.0)
Platelets: 741 10*3/uL — ABNORMAL HIGH (ref 150–400)
RBC: 3.92 MIL/uL (ref 3.87–5.11)
RDW: 15.3 % (ref 11.5–15.5)
WBC: 22 10*3/uL — ABNORMAL HIGH (ref 4.0–10.5)
nRBC: 0 % (ref 0.0–0.2)

## 2021-12-12 LAB — MAGNESIUM: Magnesium: 1.8 mg/dL (ref 1.7–2.4)

## 2021-12-12 MED ORDER — MAGNESIUM SULFATE 2 GM/50ML IV SOLN
2.0000 g | Freq: Once | INTRAVENOUS | Status: AC
  Administered 2021-12-12: 2 g via INTRAVENOUS
  Filled 2021-12-12: qty 50

## 2021-12-12 MED ORDER — POTASSIUM CHLORIDE CRYS ER 20 MEQ PO TBCR
40.0000 meq | EXTENDED_RELEASE_TABLET | Freq: Two times a day (BID) | ORAL | Status: AC
  Administered 2021-12-12 (×2): 40 meq via ORAL
  Filled 2021-12-12 (×2): qty 2

## 2021-12-12 NOTE — Progress Notes (Signed)
Pt had 7 beats run of VTach followed by 14 beats run of VTach. Pt was in the commode and complains of flatulence. Pt denies chest pain, denies nausea and vomiting, not in respiratory distress. Dr. Jodi Mourning was notified and order for Magnesium lab. Will continue to monitor pt.

## 2021-12-12 NOTE — Progress Notes (Addendum)
   Subjective:  Patient evaluated at bedside. State she has been tolerating food better yesterday.  Continues to have diarrhea. Able to ambulate to bathroom with assistance.   Objective:  Vital signs in last 24 hours: Vitals:   12/12/21 0057 12/12/21 0058 12/12/21 0245 12/12/21 0847  BP: 114/60  115/74 (!) 109/59  Pulse: (!) 117 (!) 118  (!) 111  Resp:   20 16  Temp: 97.8 F (36.6 C)   98.2 F (36.8 C)  TempSrc: Oral   Oral  SpO2: 96% 95% 94% 96%  Weight:      Height:       Physical Exam: General: Resting in bed, in no acute distress Cardiovascular: Tachycardia, no LE edema Gastrointestinal: distended with mild tenderness, JP drain in place with small amount of serosanguinous drainage.  Psych: anxious  Assessment/Plan:  Principal Problem:   Perforated viscus Active Problems:   Gastric ulcer  Kimberly Terrell is 46 y/o female with a PMH of anxiety, arthritis, asthma (well controlled), h/o H pylori and PUD who presents with acutely worsened abdominal pain and admitted for perforated gastric ulcer s/p xlap and repair.   #Perforated gastric ulcer  #post operative ileus #small intra-abdominal abscesses Tolerating food better yesterday. Leukocytosis increased to 22. Afebrile overnight. CT abdomen with 2 small abscesses Evaluated by IR currently too small to drain. Will repeat on 12/14/2021. - continue zosyn - continue protonix - encourage PO intake and boost - Zofran and compazine   #Acute Heart Failure with mildly reduced EF #Elevated troponins and diffuse ST elevations #Vtach 2 episodes of vtach overnight asymptomatic. Remains tachycardic likely due to inflammation from abscesses. 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  >Continue metoprolol succinate, and Entresto > repeat echo once HR improved > monitor I/os > Cardiac monitoring  #Diarrhea Continues to have diarrhea, chronic from prior to admission.   > Simethicone  > Outpatient GI followup   #Asthma  #Cough # Incidental Pulmonary Nodule # Tobacco use Hx of asthma. Smoking hx. CT shows 4 mm Left UL pulmonary nodule. >  IS > Home Albuterol  > Mucinex  > Nicotine patch  > PFTs as outpatient  > Recommend imaging surveillance of pulmonary nodule as outpatient    #Arthritis  > Rheumatology f/u as outpatient > Avoiding NSAIDs (for perforated ulcer) > Methocarbamol 500 mg  > Voltaren gel   Diet: Soft Bowel: none VTE: Lovenox IVF: none Code: Full code   Prior to Admission Living Arrangement: home Anticipated Discharge Location: hhome Barriers to Discharge: continued management Dispo: Anticipated discharge in approximately more than 2 day(s).    Iona Beard, MD 12/12/2021, 12:03 PM Pager: [phone] After 5pm on weekdays and 1pm on weekends: On Call pager [phone]

## 2021-12-12 NOTE — Progress Notes (Signed)
Pt. Complained of new left foot pain. It was on the left outside of the foot. New pain that scared her. I rubbed her foot for a while and she said that felt better. Pt said she just wanted [place] to be aware

## 2021-12-12 NOTE — Progress Notes (Signed)
  Request seen to evaluate patient for image guided drainage of fluid collections in the pelvis.  CT scan and chart reviewed by Dr. Kathlene Cote.  Right pelvic collection is not accessible. The left paracolic collection is too small.  Recommend antibiotics and repeat CT scan in a few a days.  Aissa Lisowski S Olene Godfrey PA-C 12/12/2021 8:24 AM

## 2021-12-12 NOTE — Progress Notes (Signed)
Patient ID: Kimberly Terrell. Kimberly Terrell, female   DOB: Dec 13, 1975, 46 y.o.   MRN: [id number] [facility] Surgery Progress Note:   8 Days Post-Op   THE PLAN  CT review by Dr. Ardelle Balls of the two rim enhancing lesions is amenable to percutaneous drainage.  Followup CT in a few days recommended.  Will continue Zosyn.   Subjective: Mental status is clear.  Complaints she does have sweats. Objective: Vital signs in last 24 hours: Temp:  [97.8 F (36.6 C)-98.2 F (36.8 C)] 98.2 F (36.8 C) (09/16 0847) Pulse Rate:  [74-144] 111 (09/16 0847) Resp:  [16-20] 16 (09/16 0847) BP: (106-115)/(51-74) 109/59 (09/16 0847) SpO2:  [92 %-100 %] 96 % (09/16 0847)  Intake/Output from previous day: 09/15 0701 - 09/16 0700 In: 1283.2 [P.O.:1200; IV Piggyback:83.2] Out: 310 [Urine:300; Drains:10] Intake/Output this shift: No intake/output data recorded.  Physical Exam: Work of breathing is normal.  Right sided drain is serous and scant.  Incision packed recently.    Lab Results:  Results for orders placed or performed during the hospital encounter of 12/03/21 (from the past 48 hour(s))  Folate     Status: None   Collection Time: 12/10/21  4:34 PM  Result Value Ref Range   Folate 6.5 >5.9 ng/mL    Comment: Performed at [facility], [address], [place], [place] [address]  CBC     Status: Abnormal   Collection Time: 12/11/21  7:02 AM  Result Value Ref Range   WBC 21.3 (H) 4.0 - 10.5 K/uL   RBC 3.78 (L) 3.87 - 5.11 MIL/uL   Hemoglobin 10.1 (L) 12.0 - 15.0 g/dL   HCT 52.8 (L) 41.3 - 24.4 %   MCV 80.7 80.0 - 100.0 fL   MCH 26.7 26.0 - 34.0 pg   MCHC 33.1 30.0 - 36.0 g/dL   RDW 01.0 27.2 - 53.6 %   Platelets 647 (H) 150 - 400 K/uL   nRBC 0.0 0.0 - 0.2 %    Comment: Performed at [facility], [address], [place], [place] [address]  Basic metabolic panel     Status: Abnormal   Collection Time: 12/11/21  7:02 AM  Result Value Ref Range   Sodium 133 (L) 135 - 145 mmol/L    Potassium 3.9 3.5 - 5.1 mmol/L   Chloride 97 (L) 98 - 111 mmol/L   CO2 24 22 - 32 mmol/L   Glucose, Bld 98 70 - 99 mg/dL    Comment: Glucose reference range applies only to samples taken after fasting for at least 8 hours.   BUN 5 (L) 6 - 20 mg/dL   Creatinine, Ser 4.74 0.44 - 1.00 mg/dL   Calcium 8.5 (L) 8.9 - 10.3 mg/dL   GFR, Estimated >25 >95 mL/min    Comment: (NOTE) Calculated using the CKD-EPI Creatinine Equation (2021)    Anion gap 12 5 - 15    Comment: Performed at [facility], [address], [place], [place] [address]  Lipid panel     Status: Abnormal   Collection Time: 12/12/21  2:33 AM  Result Value Ref Range   Cholesterol 133 0 - 200 mg/dL   Triglycerides 643 (H) <150 mg/dL   HDL 17 (L) >32 mg/dL   Total CHOL/HDL Ratio 7.8 RATIO   VLDL 34 0 - 40 mg/dL   LDL Cholesterol 82 0 - 99 mg/dL    Comment:        Total Cholesterol/HDL:CHD Risk Coronary Heart Disease  Risk Table                     Men   Women  1/2 Average Risk   3.4   3.3  Average Risk       5.0   4.4  2 X Average Risk   9.6   7.1  3 X Average Risk  23.4   11.0        Use the calculated Patient Ratio above and the CHD Risk Table to determine the patient's CHD Risk.        ATP III CLASSIFICATION (LDL):  <100     mg/dL   Optimal  100-129  mg/dL   Near or Above                    Optimal  130-159  mg/dL   Borderline  160-189  mg/dL   High  >190     mg/dL   Very High Performed at [facility] [address], [place], [place] [address]   CBC     Status: Abnormal   Collection Time: 12/12/21  2:33 AM  Result Value Ref Range   WBC 22.0 (H) 4.0 - 10.5 K/uL   RBC 3.92 3.87 - 5.11 MIL/uL   Hemoglobin 10.3 (L) 12.0 - 15.0 g/dL   HCT 31.7 (L) 36.0 - 46.0 %   MCV 80.9 80.0 - 100.0 fL   MCH 26.3 26.0 - 34.0 pg   MCHC 32.5 30.0 - 36.0 g/dL   RDW 15.3 11.5 - 15.5 %   Platelets 741 (H) 150 - 400 K/uL   nRBC 0.0 0.0 - 0.2 %    Comment: Performed at [facility], [place] [address],  [place], [place] [address]  Basic metabolic panel     Status: Abnormal   Collection Time: 12/12/21  2:33 AM  Result Value Ref Range   Sodium 131 (L) 135 - 145 mmol/L   Potassium 3.3 (L) 3.5 - 5.1 mmol/L   Chloride 94 (L) 98 - 111 mmol/L   CO2 24 22 - 32 mmol/L   Glucose, Bld 109 (H) 70 - 99 mg/dL    Comment: Glucose reference range applies only to samples taken after fasting for at least 8 hours.   BUN 5 (L) 6 - 20 mg/dL   Creatinine, Ser 0.57 0.44 - 1.00 mg/dL   Calcium 8.6 (L) 8.9 - 10.3 mg/dL   GFR, Estimated >60 >60 mL/min    Comment: (NOTE) Calculated using the CKD-EPI Creatinine Equation (2021)    Anion gap 13 5 - 15    Comment: Performed at [facility] [address], [place], [place] [address]  Magnesium     Status: None   Collection Time: 12/12/21  2:33 AM  Result Value Ref Range   Magnesium 1.8 1.7 - 2.4 mg/dL    Comment: Performed at [facility] [address] [place], [place] [address]    Radiology/Results: CT ABDOMEN PELVIS W CONTRAST  Result Date: 12/11/2021 CLINICAL DATA:  Postop perforated viscus. Evaluate for intra-abdominal abscess. EXAM: CT ABDOMEN AND PELVIS WITH CONTRAST TECHNIQUE: Multidetector CT imaging of the abdomen and pelvis was performed using the standard protocol following bolus administration of intravenous contrast. RADIATION DOSE REDUCTION: This exam was performed according to the departmental dose-optimization program which includes automated exposure control, adjustment of the mA and/or kV according to patient size and/or use of iterative reconstruction technique. CONTRAST:  16mL OMNIPAQUE  IOHEXOL 350 MG/ML SOLN COMPARISON:  12/04/2021 FINDINGS: Lower chest: Heart size is normal.  Mild bibasilar atelectasis. Hepatobiliary: Liver, gallbladder and biliary tree are normal. Pancreas: Normal. Spleen: Normal. Adrenals/Urinary Tract: Adrenal glands are normal. Kidneys are normal in size without hydronephrosis or focal mass. Resolution of previously  seen patchy left renal cortical low-attenuation. Bladder is unremarkable. Ureters are within normal. Stomach/Bowel: Stomach is unremarkable. There are multiple dilated air and contrast filled jejunal loops in the mid to upper abdomen measuring up to 5.5 cm in diameter likely representing postoperative ileus. Distal small bowel is normal. No definite transition point noted. Appendix is not clearly visualized, although there does appear to be a tubular tubular low-density structure in the retrocecal region. This measures up to 9 mm in diameter and may represent a postoperative fluid collection and less likely the appendix. No significant adjacent inflammatory change. Descending colon is somewhat decompressed. Vascular/Lymphatic: Abdominal aorta is normal in caliber. No adenopathy. Reproductive: Uterus and ovaries are unremarkable. Other: Rim enhancing low-density collections over the left pericolic gutter adjacent to the descending colon with the largest focal collection measuring approximately 1.9 x 3.1 cm suspicious for abscess. Slight rim enhancing fluid collection over the right posterior pelvis measuring 2.1 x 3.6 cm likely postoperative fluid collection and may be due to infection. Non rim enhancing collection of fluid abutting the posterior aspect of the spleen. Postsurgical changes over the midline abdominal wall. Percutaneous surgical drain with tip over the right upper quadrant adjacent the distal stomach. Musculoskeletal: No focal abnormality. IMPRESSION: 1. Multiple dilated air and contrast filled jejunal loops in the mid to upper abdomen measuring up to 5.5 cm in diameter without definite transition point. Findings likely due to postoperative ileus. 2. Rim enhancing low-density collections over the left pericolic gutter with the largest measuring 1.9 x 3.1 cm suspicious for abscess. Slight rim enhancing fluid collection over the right posterior pelvis measuring 2.1 x 3.6 cm which may represent an  additional abscess. Small rim enhancing tubular collection in the retrocecal location which may represent an additional postop fluid collection and less likely the appendix. Additional small fluid collection adjacent the posterior spleen without significant rim enhancement. Percutaneous surgical drain with tip over the right upper quadrant adjacent the distal stomach. Electronically Signed   By: Elberta Fortis M.D.   On: 12/11/2021 16:25    Anti-infectives: Anti-infectives (From admission, onward)    Start     Dose/Rate Route Frequency Ordered Stop   12/11/21 1800  piperacillin-tazobactam (ZOSYN) IVPB 3.375 g        3.375 g 12.5 mL/hr over 240 Minutes Intravenous Every 8 hours 12/11/21 1738     12/04/21 1445  piperacillin-tazobactam (ZOSYN) IVPB 3.375 g  Status:  Discontinued        3.375 g 12.5 mL/hr over 240 Minutes Intravenous Every 8 hours 12/04/21 1357 12/04/21 1434   12/04/21 1200  piperacillin-tazobactam (ZOSYN) IVPB 3.375 g        3.375 g 12.5 mL/hr over 240 Minutes Intravenous Every 8 hours 12/04/21 1008 12/09/21 1159   12/04/21 0530  piperacillin-tazobactam (ZOSYN) IVPB 3.375 g        3.375 g 100 mL/hr over 30 Minutes Intravenous  Once 12/04/21 0517 12/04/21 0704       Assessment/Plan: Problem List: Patient Active Problem List   Diagnosis Date Noted   Perforated viscus 12/04/2021   Gastric ulcer 12/04/2021   Morton's neuroma of right foot 09/23/2021   Bunion of left foot 09/23/2021   Controlled substance agreement broken  04/09/2020   Benzodiazepine dependence (HCC) 04/09/2020   Shoulder pain, bilateral 02/27/2020   Asthma 10/30/2019   Chronic hip pain 10/30/2019   TOA (tubo-ovarian abscess) 07/04/2019   IUD infection (HCC) 07/04/2019   Gastroesophageal reflux disease without esophagitis 05/04/2019   H. pylori infection 03/28/2019   Abnormal CT of the abdomen 03/28/2019   Chronic daily headache 10/12/2017   Allergic rhinitis 07/25/2017   Depression, recurrent (HCC)  04/04/2017   Migraine 04/04/2017   Bursitis of right hip 04/04/2017   GAD (generalized anxiety disorder) 04/04/2017   DUB (dysfunctional uterine bleeding) 04/04/2017    Leukocytosis and CT collections--to be treated with IV antibiotics-Zosyn 8 Days Post-Op    LOS: 8 days   Matt B. Daphine Deutscher, MD, [facility] [phone] to reach the surgeon on call.    12/12/2021 10:10 AM

## 2021-12-12 NOTE — Plan of Care (Signed)

## 2021-12-12 NOTE — Progress Notes (Signed)
Rounding Note    Patient Name: Kimberly Terrell. [name] Date of Encounter: 12/12/2021  [facility] Cardiologist: Charlton Haws, MD   Subjective   Noted to have some NSVT overnight- no chest pain. BP normal to low normal, will not allow uptitration of meds.   Inpatient Medications    Scheduled Meds:  (feeding supplement) PROSource Plus  30 mL Oral BID BM   acetaminophen  1,000 mg Oral Q6H   dextromethorphan-guaiFENesin  1 tablet Oral BID   enoxaparin (LOVENOX) injection  40 mg Subcutaneous Q24H   feeding supplement  1 Container Oral TID BM   metoprolol succinate  25 mg Oral Daily   multivitamin with minerals  1 tablet Oral Daily   nicotine  14 mg Transdermal Daily   mouth rinse  15 mL Mouth Rinse 4 times per day   pantoprazole  40 mg Oral Daily   potassium chloride  40 mEq Oral BID   sacubitril-valsartan  1 tablet Oral BID   Continuous Infusions:  methocarbamol (ROBAXIN) IV     piperacillin-tazobactam (ZOSYN)  IV 12.5 mL/hr at 12/12/21 1142   PRN Meds: albuterol, diclofenac Sodium, HYDROmorphone (DILAUDID) injection, methocarbamol (ROBAXIN) IV, neomycin-bacitracin-polymyxin, ondansetron **OR** ondansetron (ZOFRAN) IV, mouth rinse, oxyCODONE, phenol, prochlorperazine, simethicone, zolpidem   Vital Signs    Vitals:   12/12/21 0057 12/12/21 0058 12/12/21 0245 12/12/21 0847  BP: 114/60  115/74 (!) 109/59  Pulse: (!) 117 (!) 118  (!) 111  Resp:   20 16  Temp: 97.8 F (36.6 C)   98.2 F (36.8 C)  TempSrc: Oral   Oral  SpO2: 96% 95% 94% 96%  Weight:      Height:        Intake/Output Summary (Last 24 hours) at 12/12/2021 1328 Last data filed at 12/12/2021 1142 Gross per 24 hour  Intake 658.85 ml  Output 110 ml  Net 548.85 ml      12/06/2021    2:49 AM 12/04/2021   10:20 AM 09/23/2021    1:32 PM  Last 3 Weights  Weight (lbs) 146 lb 8 oz 140 lb 145 lb  Weight (kg) 66.452 kg 63.504 kg 65.772 kg      Telemetry    Sinus tachycardia 100-130s  - Personally  Reviewed  ECG    N/A today - Personally Reviewed  Physical Exam   GEN: No acute distress.   Neck: No JVD Cardiac: Regular rhythm tachycardiac, no murmurs, rubs, or gallops.  Respiratory: Clear to auscultation bilaterally. On [place] oxygen GI: Soft, tender MS: No leg edema; No deformity. Neuro:  Nonfocal  Psych: Normal affect   Labs    High Sensitivity Troponin:   Recent Labs  Lab 12/04/21 2155 12/05/21 0034 12/05/21 0832 12/05/21 1035 12/06/21 0701  TROPONINIHS 7,608* 9,474* 10,518* 9,799* 6,061*     Chemistry Recent Labs  Lab 12/08/21 1610 12/09/21 0446 12/10/21 0242 12/11/21 0702 12/12/21 0233  NA 134* 134* 134* 133* 131*  K 3.3* 3.3* 3.3* 3.9 3.3*  CL 98 93* 95* 97* 94*  CO2 20* 25 26 24 24   GLUCOSE 73 117* 90 98 109*  BUN 5* <5* 5* 5* 5*  CREATININE 0.73 0.70 0.65 0.57 0.57  CALCIUM 8.5* 8.7* 8.5* 8.5* 8.6*  MG 1.9 1.9  --   --  1.8  GFRNONAA >60 >60 >60 >60 >60  ANIONGAP 16* 16* 13 12 13     Lipids  Recent Labs  Lab 12/12/21 0233  CHOL 133  TRIG 171*  HDL 17*  LDLCALC  82  CHOLHDL 7.8    Hematology Recent Labs  Lab 12/10/21 0242 12/11/21 0702 12/12/21 0233  WBC 16.6* 21.3* 22.0*  RBC 3.65* 3.78* 3.92  HGB 9.8* 10.1* 10.3*  HCT 29.8* 30.5* 31.7*  MCV 81.6 80.7 80.9  MCH 26.8 26.7 26.3  MCHC 32.9 33.1 32.5  RDW 15.3 15.4 15.3  PLT 563* 647* 741*   Thyroid No results for input(s): "TSH", "FREET4" in the last 168 hours.  BNPNo results for input(s): "BNP", "PROBNP" in the last 168 hours.  DDimer No results for input(s): "DDIMER" in the last 168 hours.   Radiology    CT ABDOMEN PELVIS W CONTRAST  Result Date: 12/11/2021 CLINICAL DATA:  Postop perforated viscus. Evaluate for intra-abdominal abscess. EXAM: CT ABDOMEN AND PELVIS WITH CONTRAST TECHNIQUE: Multidetector CT imaging of the abdomen and pelvis was performed using the standard protocol following bolus administration of intravenous contrast. RADIATION DOSE REDUCTION: This exam was  performed according to the departmental dose-optimization program which includes automated exposure control, adjustment of the mA and/or kV according to patient size and/or use of iterative reconstruction technique. CONTRAST:  69mL OMNIPAQUE IOHEXOL 350 MG/ML SOLN COMPARISON:  12/04/2021 FINDINGS: Lower chest: Heart size is normal.  Mild bibasilar atelectasis. Hepatobiliary: Liver, gallbladder and biliary tree are normal. Pancreas: Normal. Spleen: Normal. Adrenals/Urinary Tract: Adrenal glands are normal. Kidneys are normal in size without hydronephrosis or focal mass. Resolution of previously seen patchy left renal cortical low-attenuation. Bladder is unremarkable. Ureters are within normal. Stomach/Bowel: Stomach is unremarkable. There are multiple dilated air and contrast filled jejunal loops in the mid to upper abdomen measuring up to 5.5 cm in diameter likely representing postoperative ileus. Distal small bowel is normal. No definite transition point noted. Appendix is not clearly visualized, although there does appear to be a tubular tubular low-density structure in the retrocecal region. This measures up to 9 mm in diameter and may represent a postoperative fluid collection and less likely the appendix. No significant adjacent inflammatory change. Descending colon is somewhat decompressed. Vascular/Lymphatic: Abdominal aorta is normal in caliber. No adenopathy. Reproductive: Uterus and ovaries are unremarkable. Other: Rim enhancing low-density collections over the left pericolic gutter adjacent to the descending colon with the largest focal collection measuring approximately 1.9 x 3.1 cm suspicious for abscess. Slight rim enhancing fluid collection over the right posterior pelvis measuring 2.1 x 3.6 cm likely postoperative fluid collection and may be due to infection. Non rim enhancing collection of fluid abutting the posterior aspect of the spleen. Postsurgical changes over the midline abdominal wall.  Percutaneous surgical drain with tip over the right upper quadrant adjacent the distal stomach. Musculoskeletal: No focal abnormality. IMPRESSION: 1. Multiple dilated air and contrast filled jejunal loops in the mid to upper abdomen measuring up to 5.5 cm in diameter without definite transition point. Findings likely due to postoperative ileus. 2. Rim enhancing low-density collections over the left pericolic gutter with the largest measuring 1.9 x 3.1 cm suspicious for abscess. Slight rim enhancing fluid collection over the right posterior pelvis measuring 2.1 x 3.6 cm which may represent an additional abscess. Small rim enhancing tubular collection in the retrocecal location which may represent an additional postop fluid collection and less likely the appendix. Additional small fluid collection adjacent the posterior spleen without significant rim enhancement. Percutaneous surgical drain with tip over the right upper quadrant adjacent the distal stomach. Electronically Signed   By: Elberta Fortis M.D.   On: 12/11/2021 16:25    Cardiac Studies   Echo from  12/04/21:   1. Distal septal/ apical/ inferior basal hypokinesis No obvious mural  thrombus but would consider repeating TTE after emergent surgery for  perforated gastric ulcer when HR lower and images better . Left  ventricular ejection fraction, by estimation, is  40 to 45%. The left ventricle has mildly decreased function. The left  ventricle has no regional wall motion abnormalities. Left ventricular  diastolic parameters are indeterminate.   2. Right ventricular systolic function is mildly reduced. The right  ventricular size is mildly enlarged. There is normal pulmonary artery  systolic pressure.   3. The mitral valve is normal in structure. No evidence of mitral valve  regurgitation. No evidence of mitral stenosis.   4. The aortic valve is tricuspid. Aortic valve regurgitation is not  visualized. No aortic stenosis is present.   5. The  inferior vena cava is normal in size with greater than 50%  respiratory variability, suggesting right atrial pressure of 3 mmHg.  Patient Profile     46 y.o. female with PMH of H. Pylori infection,PUD, GERD, anxiety, depression, asthma, tobacco use, who is admitted for peritonitis due to perforated distal body posterior wall gastric ulcer, underwent emergent exploratory laparotomy with closure of posterior gastric wall ulcer and biopsy of ulcer on 12/04/21. Cardiology is consulted and following for elevated trop peaked 10518 with initial EKG showing STE of II,III,aVF, V4-V6, as well as Echo showing LVEF 40-45%, mild RV dysfunction. Distal septal/ apical/ inferior basal hypokinesis.   Assessment & Plan    NSTEMI - in the setting of peritenonitis with perforated peptic ulcer requiring emergent surgery  - Hs trop peaked 10,508 - EKG initially 9/8 had STE of II,III,aVF, V4-V6, repeat EKG 9/9 had resolved ST changes  - no significant atherosclerotic disease noted on non-coronary CTA torso at admission - Echo LVEF 40-45% (was 60-65% in 2021), mild RV dysfunction. Distal septal/ apical/ inferior basal hypokinesis. 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  - Echo as above - No CHF symptoms prior to current admission - currently remains hypoxic, can be multifactorial due to post-op atelectasis + CHF, not overly hypervolemic  - can use PRN Lasix when hypoxia worsen with edema, rapid weight gain - GDMT: Toprol 25mg  XL, started entresto 24/26  BID, may add SGLT2I before discharge   NSVT  - 2 more episodes overnight- continue Toprol XL 25mg  daily, keep K >4 and Mag >2, bp will not allow uptitration of meds  Peritonitis with perforated peptic ulcer Asthma Tobacco use Anxiety with depression Anemia  - per primary team   For questions or updates, please contact [place] Please consult [email or web address] for contact info under    Pixie Casino, MD, FACC, [place] Director of the [facility] Diplomate of the American Board of Clinical Lipidology Attending Cardiologist  Direct Dial: [phone]  Fax: [phone]  Website:  [email or web address]  Pixie Casino, MD  12/12/2021, 1:28 PM

## 2021-12-13 DIAGNOSIS — I248 Other forms of acute ischemic heart disease: Secondary | ICD-10-CM | POA: Diagnosis not present

## 2021-12-13 DIAGNOSIS — K651 Peritoneal abscess: Secondary | ICD-10-CM | POA: Diagnosis not present

## 2021-12-13 DIAGNOSIS — R1110 Vomiting, unspecified: Secondary | ICD-10-CM | POA: Diagnosis not present

## 2021-12-13 DIAGNOSIS — I4729 Other ventricular tachycardia: Secondary | ICD-10-CM

## 2021-12-13 DIAGNOSIS — I5021 Acute systolic (congestive) heart failure: Secondary | ICD-10-CM

## 2021-12-13 DIAGNOSIS — R5082 Postprocedural fever: Secondary | ICD-10-CM | POA: Diagnosis not present

## 2021-12-13 DIAGNOSIS — K259 Gastric ulcer, unspecified as acute or chronic, without hemorrhage or perforation: Secondary | ICD-10-CM | POA: Diagnosis not present

## 2021-12-13 DIAGNOSIS — R198 Other specified symptoms and signs involving the digestive system and abdomen: Secondary | ICD-10-CM | POA: Diagnosis not present

## 2021-12-13 LAB — CBC
HCT: 30 % — ABNORMAL LOW (ref 36.0–46.0)
Hemoglobin: 9.7 g/dL — ABNORMAL LOW (ref 12.0–15.0)
MCH: 26.3 pg (ref 26.0–34.0)
MCHC: 32.3 g/dL (ref 30.0–36.0)
MCV: 81.3 fL (ref 80.0–100.0)
Platelets: 685 10*3/uL — ABNORMAL HIGH (ref 150–400)
RBC: 3.69 MIL/uL — ABNORMAL LOW (ref 3.87–5.11)
RDW: 15.5 % (ref 11.5–15.5)
WBC: 17.4 10*3/uL — ABNORMAL HIGH (ref 4.0–10.5)
nRBC: 0 % (ref 0.0–0.2)

## 2021-12-13 LAB — BASIC METABOLIC PANEL
Anion gap: 12 (ref 5–15)
BUN: 10 mg/dL (ref 6–20)
CO2: 25 mmol/L (ref 22–32)
Calcium: 8.7 mg/dL — ABNORMAL LOW (ref 8.9–10.3)
Chloride: 94 mmol/L — ABNORMAL LOW (ref 98–111)
Creatinine, Ser: 0.64 mg/dL (ref 0.44–1.00)
GFR, Estimated: >60 mL/min (ref >60)
Glucose, Bld: 110 mg/dL — ABNORMAL HIGH (ref 70–99)
Potassium: 4.1 mmol/L (ref 3.5–5.1)
Sodium: 131 mmol/L — ABNORMAL LOW (ref 135–145)

## 2021-12-13 LAB — VITAMIN A: Vitamin A (Retinoic Acid): 14.9 ug/dL — ABNORMAL LOW (ref 20.1–62.0)

## 2021-12-13 LAB — ZINC: Zinc: 74 ug/dL (ref 44–115)

## 2021-12-13 MED ORDER — ESCITALOPRAM OXALATE 10 MG PO TABS
10.0000 mg | ORAL_TABLET | Freq: Every day | ORAL | Status: DC
  Administered 2021-12-14 – 2021-12-15 (×2): 10 mg via ORAL
  Filled 2021-12-13 (×2): qty 1

## 2021-12-13 NOTE — Progress Notes (Signed)
Subjective:   Patient states that her abdominal pain is improved from yesterday. States she is feeling well today. Denies fever or chills. States that her cough does improve with mucinex. She has been tolerating soft diet without nausea and vomiting yesterday or overnight. Continues to have episodes of diarrhea daily. Discussed that testing for Celiac disease was negative. Discussed option of advancing diet today. Patient would prefer to stick with soft diet today and consider advancing tomorrow. Discussed plan for repeat imaging of her abdomen tomorrow.    Objective:  Vital signs in last 24 hours: Vitals:   12/12/21 1400 12/12/21 2027 12/13/21 0023 12/13/21 0401  BP: (!) 105/57 108/61 98/60 (!) 101/55  Pulse: 99 97 93 96  Resp: 16 18 16 18   Temp:  98.4 F (36.9 C) 98 F (36.7 C) 97.7 F (36.5 C)  TempSrc:  Oral Oral Oral  SpO2: 97% 97% 94% 96%  Weight:      Height:       Physical Exam: General: resting in bed comfortably, not in acute distress  Respiratory: normal respiratory effort, no wheezes, rales, rhonchi, or crackles Cardiovascular: Tachycardic, regular rhythm, no murmurs, rubs, or gallops, no LE edema Gastrointestinal: distended with mild tenderness to palpation near surgical site, JP drain with minimal serous drainage  Psych: smiling, normal mood and affect  Assessment/Plan:  Principal Problem:   Perforated viscus Active Problems:   Gastric ulcer   Demand ischemia (HCC)  Kacelyn Rowzee is 46 y/o female with a PMH of anxiety, arthritis, asthma (well controlled), h/o H pylori and PUD who presented with acutely worsened abdominal pain and admitted for perforated gastric ulcer s/p xlap and repair.   #Perforated gastric ulcer  #post operative ileus #small intra-abdominal abscesses Pain improved today. Will consider transitioning away from IV pain medication tomorrow. Tolerating soft diet well. Will continue with soft diet for today and consider advancing tomorrow.  Leukocytosis improved to 17.4 today. Remains afebrile. Blood cultures negative to date. CT abdomen on 9/15 demonstrated 2 small abscesses. IR states currently too small to drain. Will repeat CT on 9/18 to reassess abscesses. Completed 6 day course of Zosyn on 9/13. Will continue Zosyn due to abscess.  - Zosyn (day 3) - Protonix - Zofran and compazine for nausea - Tylenol PRN, Dilaudid 0.5 mg IV q4 PRN, oxycodone 5-10 mg  q4 PRN for pain - Encourage PO intake w/ boost supplement   #Acute Heart Failure with mildly reduced EF #Elevated troponins and diffuse ST elevations #Vtach One episode of asymptomatic Vtach on 9/9 and two episodes on 9/16. Continues to be tachycardic, likely secondary to inflammation from abscesses. 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  - Metoprolol succinate - Entresto - I/Os - Cardiac monitoring - Repeat echo once HR improves   #Diarrhea Continues to have frequent diarrhea. Chronic and present prior to admission. Total IgA and tissue transglutaminase not concerning for Celiac disease.  - Simethicone  - Recommend outpatient GI f/u   #Asthma  #Cough # Incidental Pulmonary Nodule # Tobacco use Hx of asthma. Smoking hx. CT shows 4 mm Left UL pulmonary nodule. Satting well today on RA.  - Continue incentive spirometer  - Home Albuterol  - Mucinex  - Nicotine patch  - PFTs as outpatient  - Recommend imaging surveillance of pulmonary nodule as outpatient    #Arthritis  - Rheumatology f/u as outpatient - Avoiding NSAIDs (for perforated ulcer) - Methocarbamol 500 mg  - Voltaren gel  Diet: Soft Bowel: none VTE: Lovenox IVF: none Code: Full code   Prior to Admission Living Arrangement: home with significant other Anticipated Discharge Location: home Barriers to Discharge: continued management Dispo: Anticipated discharge in approximately more than 2 day(s).    Karoline Caldwell,  MD 12/13/2021, 5:57 AM Pager: [phone] After 5pm on weekdays and 1pm on weekends: On Call pager [phone]

## 2021-12-13 NOTE — Progress Notes (Signed)
Patient ID: Kimberly Terrell. Javorsky, female   DOB: [birth year], 46 y.o.   MRN: [id number] [facility] Progress Note:   9 Days Post-Op   THE PLAN  Continue Zosyn and consider rescan  CT~ Tuesday.    Subjective: Mental status is clear.  Complaints no complaints. Objective: Vital signs in last 24 hours: Temp:  [97.7 F (36.5 C)-98.4 F (36.9 C)] 97.7 F (36.5 C) (09/17 0401) Pulse Rate:  [93-99] 96 (09/17 0401) Resp:  [16-18] 18 (09/17 0401) BP: (98-108)/(55-61) 101/55 (09/17 0401) SpO2:  [94 %-97 %] 96 % (09/17 0401)  Intake/Output from previous day: 09/16 0701 - 09/17 0700 In: 213.5 [IV Piggyback:213.5] Out: 10 [Drains:10] Intake/Output this shift: No intake/output data recorded.  Physical Exam: Work of breathing is OK.  She has been getting up walking.  Incision was not ready for change but by report is clean.    Lab Results:  Results for orders placed or performed during the hospital encounter of 12/03/21 (from the past 48 hour(s))  Lipid panel     Status: Abnormal   Collection Time: 12/12/21  2:33 AM  Result Value Ref Range   Cholesterol 133 0 - 200 mg/dL   Triglycerides 629 (H) <150 mg/dL   HDL 17 (L) >52 mg/dL   Total CHOL/HDL Ratio 7.8 RATIO   VLDL 34 0 - 40 mg/dL   LDL Cholesterol 82 0 - 99 mg/dL    Comment:        Total Cholesterol/HDL:CHD Risk Coronary Heart Disease Risk Table                     Men   Women  1/2 Average Risk   3.4   3.3  Average Risk       5.0   4.4  2 X Average Risk   9.6   7.1  3 X Average Risk  23.4   11.0        Use the calculated Patient Ratio above and the CHD Risk Table to determine the patient's CHD Risk.        ATP III CLASSIFICATION (LDL):  <100     mg/dL   Optimal  841-324  mg/dL   Near or Above                    Optimal  130-159  mg/dL   Borderline  401-027  mg/dL   High  >253     mg/dL   Very High Performed at [facility], [address] [place], [place] [address]   CBC     Status: Abnormal   Collection  Time: 12/12/21  2:33 AM  Result Value Ref Range   WBC 22.0 (H) 4.0 - 10.5 K/uL   RBC 3.92 3.87 - 5.11 MIL/uL   Hemoglobin 10.3 (L) 12.0 - 15.0 g/dL   HCT 34.7 (L) 42.5 - 95.6 %   MCV 80.9 80.0 - 100.0 fL   MCH 26.3 26.0 - 34.0 pg   MCHC 32.5 30.0 - 36.0 g/dL   RDW 38.7 56.4 - 33.2 %   Platelets 741 (H) 150 - 400 K/uL   nRBC 0.0 0.0 - 0.2 %    Comment: Performed at [facility], [address], [place], [place] [address]  Basic metabolic panel     Status: Abnormal   Collection Time: 12/12/21  2:33 AM  Result Value Ref Range   Sodium 131 (L) 135 - 145 mmol/L   Potassium  3.3 (L) 3.5 - 5.1 mmol/L   Chloride 94 (L) 98 - 111 mmol/L   CO2 24 22 - 32 mmol/L   Glucose, Bld 109 (H) 70 - 99 mg/dL    Comment: Glucose reference range applies only to samples taken after fasting for at least 8 hours.   BUN 5 (L) 6 - 20 mg/dL   Creatinine, Ser 1.61 0.44 - 1.00 mg/dL   Calcium 8.6 (L) 8.9 - 10.3 mg/dL   GFR, Estimated >09 >60 mL/min    Comment: (NOTE) Calculated using the CKD-EPI Creatinine Equation (2021)    Anion gap 13 5 - 15    Comment: Performed at [facility], [address], [place], [place] [address]  Magnesium     Status: None   Collection Time: 12/12/21  2:33 AM  Result Value Ref Range   Magnesium 1.8 1.7 - 2.4 mg/dL    Comment: Performed at [facility], [address], [place], [place] [address]  CBC     Status: Abnormal   Collection Time: 12/13/21  1:49 AM  Result Value Ref Range   WBC 17.4 (H) 4.0 - 10.5 K/uL   RBC 3.69 (L) 3.87 - 5.11 MIL/uL   Hemoglobin 9.7 (L) 12.0 - 15.0 g/dL   HCT 47.8 (L) 29.5 - 62.1 %   MCV 81.3 80.0 - 100.0 fL   MCH 26.3 26.0 - 34.0 pg   MCHC 32.3 30.0 - 36.0 g/dL   RDW 30.8 65.7 - 84.6 %   Platelets 685 (H) 150 - 400 K/uL   nRBC 0.0 0.0 - 0.2 %    Comment: Performed at [facility], [address] [place], [place] [address]  Basic metabolic panel     Status: Abnormal   Collection Time: 12/13/21  1:49 AM  Result Value  Ref Range   Sodium 131 (L) 135 - 145 mmol/L   Potassium 4.1 3.5 - 5.1 mmol/L   Chloride 94 (L) 98 - 111 mmol/L   CO2 25 22 - 32 mmol/L   Glucose, Bld 110 (H) 70 - 99 mg/dL    Comment: Glucose reference range applies only to samples taken after fasting for at least 8 hours.   BUN 10 6 - 20 mg/dL   Creatinine, Ser 2.84 0.44 - 1.00 mg/dL   Calcium 8.7 (L) 8.9 - 10.3 mg/dL   GFR, Estimated >13 >24 mL/min    Comment: (NOTE) Calculated using the CKD-EPI Creatinine Equation (2021)    Anion gap 12 5 - 15    Comment: Performed at [facility], [address], [place], [place] [address]    Radiology/Results: CT ABDOMEN PELVIS W CONTRAST  Result Date: 12/11/2021 CLINICAL DATA:  Postop perforated viscus. Evaluate for intra-abdominal abscess. EXAM: CT ABDOMEN AND PELVIS WITH CONTRAST TECHNIQUE: Multidetector CT imaging of the abdomen and pelvis was performed using the standard protocol following bolus administration of intravenous contrast. RADIATION DOSE REDUCTION: This exam was performed according to the departmental dose-optimization program which includes automated exposure control, adjustment of the mA and/or kV according to patient size and/or use of iterative reconstruction technique. CONTRAST:  42mL OMNIPAQUE IOHEXOL 350 MG/ML SOLN COMPARISON:  12/04/2021 FINDINGS: Lower chest: Heart size is normal.  Mild bibasilar atelectasis. Hepatobiliary: Liver, gallbladder and biliary tree are normal. Pancreas: Normal. Spleen: Normal. Adrenals/Urinary Tract: Adrenal glands are normal. Kidneys are normal in size without hydronephrosis or focal mass. Resolution of previously seen patchy left renal cortical low-attenuation. Bladder is unremarkable. Ureters are  within normal. Stomach/Bowel: Stomach is unremarkable. There are multiple dilated air and contrast filled jejunal loops in the mid to upper abdomen measuring up to 5.5 cm in diameter likely representing postoperative ileus. Distal small bowel is normal.  No definite transition point noted. Appendix is not clearly visualized, although there does appear to be a tubular tubular low-density structure in the retrocecal region. This measures up to 9 mm in diameter and may represent a postoperative fluid collection and less likely the appendix. No significant adjacent inflammatory change. Descending colon is somewhat decompressed. Vascular/Lymphatic: Abdominal aorta is normal in caliber. No adenopathy. Reproductive: Uterus and ovaries are unremarkable. Other: Rim enhancing low-density collections over the left pericolic gutter adjacent to the descending colon with the largest focal collection measuring approximately 1.9 x 3.1 cm suspicious for abscess. Slight rim enhancing fluid collection over the right posterior pelvis measuring 2.1 x 3.6 cm likely postoperative fluid collection and may be due to infection. Non rim enhancing collection of fluid abutting the posterior aspect of the spleen. Postsurgical changes over the midline abdominal wall. Percutaneous surgical drain with tip over the right upper quadrant adjacent the distal stomach. Musculoskeletal: No focal abnormality. IMPRESSION: 1. Multiple dilated air and contrast filled jejunal loops in the mid to upper abdomen measuring up to 5.5 cm in diameter without definite transition point. Findings likely due to postoperative ileus. 2. Rim enhancing low-density collections over the left pericolic gutter with the largest measuring 1.9 x 3.1 cm suspicious for abscess. Slight rim enhancing fluid collection over the right posterior pelvis measuring 2.1 x 3.6 cm which may represent an additional abscess. Small rim enhancing tubular collection in the retrocecal location which may represent an additional postop fluid collection and less likely the appendix. Additional small fluid collection adjacent the posterior spleen without significant rim enhancement. Percutaneous surgical drain with tip over the right upper quadrant  adjacent the distal stomach. Electronically Signed   By: Marin Olp M.D.   On: 12/11/2021 16:25    Anti-infectives: Anti-infectives (From admission, onward)    Start     Dose/Rate Route Frequency Ordered Stop   12/11/21 1800  piperacillin-tazobactam (ZOSYN) IVPB 3.375 g        3.375 g 12.5 mL/hr over 240 Minutes Intravenous Every 8 hours 12/11/21 1738     12/04/21 1445  piperacillin-tazobactam (ZOSYN) IVPB 3.375 g  Status:  Discontinued        3.375 g 12.5 mL/hr over 240 Minutes Intravenous Every 8 hours 12/04/21 1357 12/04/21 1434   12/04/21 1200  piperacillin-tazobactam (ZOSYN) IVPB 3.375 g        3.375 g 12.5 mL/hr over 240 Minutes Intravenous Every 8 hours 12/04/21 1008 12/09/21 1159   12/04/21 0530  piperacillin-tazobactam (ZOSYN) IVPB 3.375 g        3.375 g 100 mL/hr over 30 Minutes Intravenous  Once 12/04/21 0517 12/04/21 0704       Assessment/Plan: Problem List: Patient Active Problem List   Diagnosis Date Noted   Demand ischemia [facility])    Perforated viscus 12/04/2021   Gastric ulcer 12/04/2021   Morton's neuroma of right foot 09/23/2021   Bunion of left foot 09/23/2021   Controlled substance agreement broken 04/09/2020   Benzodiazepine dependence ([place]) 04/09/2020   Shoulder pain, bilateral 02/27/2020   Asthma 10/30/2019   Chronic hip pain 10/30/2019   TOA (tubo-ovarian abscess) 07/04/2019   IUD infection ([facility]) 07/04/2019   Gastroesophageal reflux disease without esophagitis 05/04/2019   H. pylori infection 03/28/2019   Abnormal CT of  the abdomen 03/28/2019   Chronic daily headache 10/12/2017   Allergic rhinitis 07/25/2017   Depression, recurrent (HCC) 04/04/2017   Migraine 04/04/2017   Bursitis of right hip 04/04/2017   GAD (generalized anxiety disorder) 04/04/2017   DUB (dysfunctional uterine bleeding) 04/04/2017    Small abscesses were felt to be too small to percutaneously drain.  Will recheck CT this next week.   9 Days Post-Op    LOS: 9 days    Matt B. Daphine Deutscher, MD, [facility] [phone] to reach the surgeon on call.    12/13/2021 10:04 AM

## 2021-12-13 NOTE — Progress Notes (Signed)
Rounding Note    Patient Name: Kimberly Terrell. [name] Date of Encounter: 12/13/2021  [facility] Cardiologist: Jenkins Rouge, MD   Subjective   No arrhythmias overnight - ambulated without difficulty.  Inpatient Medications    Scheduled Meds:  (feeding supplement) PROSource Plus  30 mL Oral BID BM   acetaminophen  1,000 mg Oral Q6H   dextromethorphan-guaiFENesin  1 tablet Oral BID   enoxaparin (LOVENOX) injection  40 mg Subcutaneous Q24H   feeding supplement  1 Container Oral TID BM   metoprolol succinate  25 mg Oral Daily   multivitamin with minerals  1 tablet Oral Daily   nicotine  14 mg Transdermal Daily   mouth rinse  15 mL Mouth Rinse 4 times per day   pantoprazole  40 mg Oral Daily   sacubitril-valsartan  1 tablet Oral BID   Continuous Infusions:  methocarbamol (ROBAXIN) IV     piperacillin-tazobactam (ZOSYN)  IV 3.375 g (12/13/21 1046)   PRN Meds: albuterol, diclofenac Sodium, HYDROmorphone (DILAUDID) injection, methocarbamol (ROBAXIN) IV, neomycin-bacitracin-polymyxin, ondansetron **OR** ondansetron (ZOFRAN) IV, mouth rinse, oxyCODONE, phenol, prochlorperazine, simethicone, zolpidem   Vital Signs    Vitals:   12/12/21 1400 12/12/21 2027 12/13/21 0023 12/13/21 0401  BP: (!) 105/57 108/61 98/60 (!) 101/55  Pulse: 99 97 93 96  Resp: 16 18 16 18   Temp:  98.4 F (36.9 C) 98 F (36.7 C) 97.7 F (36.5 C)  TempSrc:  Oral Oral Oral  SpO2: 97% 97% 94% 96%  Weight:      Height:        Intake/Output Summary (Last 24 hours) at 12/13/2021 1059 Last data filed at 12/13/2021 0700 Gross per 24 hour  Intake 213.52 ml  Output 10 ml  Net 203.52 ml      12/06/2021    2:49 AM 12/04/2021   10:20 AM 09/23/2021    1:32 PM  Last 3 Weights  Weight (lbs) 146 lb 8 oz 140 lb 145 lb  Weight (kg) 66.452 kg 63.504 kg 65.772 kg      Telemetry    Sinus tachycardia 100-130s  - Personally Reviewed  ECG    N/A today - Personally Reviewed  Physical Exam   GEN: No  acute distress.   Neck: No JVD Cardiac: Regular rhythm tachycardiac, no murmurs, rubs, or gallops.  Respiratory: Clear to auscultation bilaterally. On [place] oxygen GI: Soft, tender MS: No leg edema; No deformity. Neuro:  Nonfocal  Psych: Normal affect   Labs    High Sensitivity Troponin:   Recent Labs  Lab 12/04/21 2155 12/05/21 0034 12/05/21 0832 12/05/21 1035 12/06/21 0701  TROPONINIHS 7,608* 9,474* 10,518* 9,799* 6,061*     Chemistry Recent Labs  Lab 12/08/21 1610 12/09/21 9604 12/10/21 0242 12/11/21 0702 12/12/21 0233 12/13/21 0149  NA 134* 134*   < > 133* 131* 131*  K 3.3* 3.3*   < > 3.9 3.3* 4.1  CL 98 93*   < > 97* 94* 94*  CO2 20* 25   < > 24 24 25   GLUCOSE 73 117*   < > 98 109* 110*  BUN 5* <5*   < > 5* 5* 10  CREATININE 0.73 0.70   < > 0.57 0.57 0.64  CALCIUM 8.5* 8.7*   < > 8.5* 8.6* 8.7*  MG 1.9 1.9  --   --  1.8  --   GFRNONAA >60 >60   < > >60 >60 >60  ANIONGAP 16* 16*   < > 12 13 12    < > =  values in this interval not displayed.    Lipids  Recent Labs  Lab 12/12/21 0233  CHOL 133  TRIG 171*  HDL 17*  LDLCALC 82  CHOLHDL 7.8    Hematology Recent Labs  Lab 12/11/21 0702 12/12/21 0233 12/13/21 0149  WBC 21.3* 22.0* 17.4*  RBC 3.78* 3.92 3.69*  HGB 10.1* 10.3* 9.7*  HCT 30.5* 31.7* 30.0*  MCV 80.7 80.9 81.3  MCH 26.7 26.3 26.3  MCHC 33.1 32.5 32.3  RDW 15.4 15.3 15.5  PLT 647* 741* 685*   Thyroid No results for input(s): "TSH", "FREET4" in the last 168 hours.  BNPNo results for input(s): "BNP", "PROBNP" in the last 168 hours.  DDimer No results for input(s): "DDIMER" in the last 168 hours.   Radiology    CT ABDOMEN PELVIS W CONTRAST  Result Date: 12/11/2021 CLINICAL DATA:  Postop perforated viscus. Evaluate for intra-abdominal abscess. EXAM: CT ABDOMEN AND PELVIS WITH CONTRAST TECHNIQUE: Multidetector CT imaging of the abdomen and pelvis was performed using the standard protocol following bolus administration of intravenous  contrast. RADIATION DOSE REDUCTION: This exam was performed according to the departmental dose-optimization program which includes automated exposure control, adjustment of the mA and/or kV according to patient size and/or use of iterative reconstruction technique. CONTRAST:  108mL OMNIPAQUE IOHEXOL 350 MG/ML SOLN COMPARISON:  12/04/2021 FINDINGS: Lower chest: Heart size is normal.  Mild bibasilar atelectasis. Hepatobiliary: Liver, gallbladder and biliary tree are normal. Pancreas: Normal. Spleen: Normal. Adrenals/Urinary Tract: Adrenal glands are normal. Kidneys are normal in size without hydronephrosis or focal mass. Resolution of previously seen patchy left renal cortical low-attenuation. Bladder is unremarkable. Ureters are within normal. Stomach/Bowel: Stomach is unremarkable. There are multiple dilated air and contrast filled jejunal loops in the mid to upper abdomen measuring up to 5.5 cm in diameter likely representing postoperative ileus. Distal small bowel is normal. No definite transition point noted. Appendix is not clearly visualized, although there does appear to be a tubular tubular low-density structure in the retrocecal region. This measures up to 9 mm in diameter and may represent a postoperative fluid collection and less likely the appendix. No significant adjacent inflammatory change. Descending colon is somewhat decompressed. Vascular/Lymphatic: Abdominal aorta is normal in caliber. No adenopathy. Reproductive: Uterus and ovaries are unremarkable. Other: Rim enhancing low-density collections over the left pericolic gutter adjacent to the descending colon with the largest focal collection measuring approximately 1.9 x 3.1 cm suspicious for abscess. Slight rim enhancing fluid collection over the right posterior pelvis measuring 2.1 x 3.6 cm likely postoperative fluid collection and may be due to infection. Non rim enhancing collection of fluid abutting the posterior aspect of the spleen.  Postsurgical changes over the midline abdominal wall. Percutaneous surgical drain with tip over the right upper quadrant adjacent the distal stomach. Musculoskeletal: No focal abnormality. IMPRESSION: 1. Multiple dilated air and contrast filled jejunal loops in the mid to upper abdomen measuring up to 5.5 cm in diameter without definite transition point. Findings likely due to postoperative ileus. 2. Rim enhancing low-density collections over the left pericolic gutter with the largest measuring 1.9 x 3.1 cm suspicious for abscess. Slight rim enhancing fluid collection over the right posterior pelvis measuring 2.1 x 3.6 cm which may represent an additional abscess. Small rim enhancing tubular collection in the retrocecal location which may represent an additional postop fluid collection and less likely the appendix. Additional small fluid collection adjacent the posterior spleen without significant rim enhancement. Percutaneous surgical drain with tip over the right upper  quadrant adjacent the distal stomach. Electronically Signed   By: Elberta Fortis M.D.   On: 12/11/2021 16:25    Cardiac Studies   Echo from 12/04/21:   1. Distal septal/ apical/ inferior basal hypokinesis No obvious mural  thrombus but would consider repeating TTE after emergent surgery for  perforated gastric ulcer when HR lower and images better . Left  ventricular ejection fraction, by estimation, is  40 to 45%. The left ventricle has mildly decreased function. The left  ventricle has no regional wall motion abnormalities. Left ventricular  diastolic parameters are indeterminate.   2. Right ventricular systolic function is mildly reduced. The right  ventricular size is mildly enlarged. There is normal pulmonary artery  systolic pressure.   3. The mitral valve is normal in structure. No evidence of mitral valve  regurgitation. No evidence of mitral stenosis.   4. The aortic valve is tricuspid. Aortic valve regurgitation is not   visualized. No aortic stenosis is present.   5. The inferior vena cava is normal in size with greater than 50%  respiratory variability, suggesting right atrial pressure of 3 mmHg.  Patient Profile     46 y.o. female with PMH of H. Pylori infection,PUD, GERD, anxiety, depression, asthma, tobacco use, who is admitted for peritonitis due to perforated distal body posterior wall gastric ulcer, underwent emergent exploratory laparotomy with closure of posterior gastric wall ulcer and biopsy of ulcer on 12/04/21. Cardiology is consulted and following for elevated trop peaked [id number] with initial EKG showing STE of II,III,aVF, V4-V6, as well as Echo showing LVEF 40-45%, mild RV dysfunction. Distal septal/ apical/ inferior basal hypokinesis.   Assessment & Plan    NSTEMI - in the setting of peritenonitis with perforated peptic ulcer requiring emergent surgery  - Hs trop peaked 10,508 - EKG initially 9/8 had STE of II,III,aVF, V4-V6, repeat EKG 9/9 had resolved ST changes  - no significant atherosclerotic disease noted on non-coronary CTA torso at admission - Echo LVEF 40-45% (was 60-65% in 2021), mild RV dysfunction. Distal septal/ apical/ inferior basal hypokinesis. 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  - Will repeat limited echo tomorrow for LVEF - No CHF symptoms prior to current admission - currently remains hypoxic, can be multifactorial due to post-op atelectasis + CHF, not overly hypervolemic  - can use PRN Lasix when hypoxia  worsen with edema, rapid weight gain - GDMT: Toprol 25mg  XL, started entresto 24/26 BID, may add SGLT2I before discharge   NSVT  - Improved- continue Toprol XL 25mg  daily, keep K >4 and Mag >2, bp will not allow uptitration of meds  Peritonitis with perforated peptic ulcer Asthma Tobacco use Anxiety with depression Anemia  - per primary team   For questions or updates, please contact [facility] Please consult [email or web address] for contact info under    , MD,  [place]  [facility]  Medical Director of the [facility]  [facility] Diplomate of the American Board of Clinical Lipidology Attending Cardiologist  Direct Dial:  [phone]  Fax: [phone]  Website:  [email or web address]  Chrystie Nose, MD  12/13/2021, 10:59 AM

## 2021-12-14 ENCOUNTER — Inpatient Hospital Stay (HOSPITAL_COMMUNITY)

## 2021-12-14 DIAGNOSIS — I428 Other cardiomyopathies: Secondary | ICD-10-CM

## 2021-12-14 DIAGNOSIS — R197 Diarrhea, unspecified: Secondary | ICD-10-CM | POA: Diagnosis not present

## 2021-12-14 DIAGNOSIS — R198 Other specified symptoms and signs involving the digestive system and abdomen: Secondary | ICD-10-CM | POA: Diagnosis not present

## 2021-12-14 DIAGNOSIS — K259 Gastric ulcer, unspecified as acute or chronic, without hemorrhage or perforation: Secondary | ICD-10-CM | POA: Diagnosis not present

## 2021-12-14 DIAGNOSIS — I5181 Takotsubo syndrome: Secondary | ICD-10-CM | POA: Diagnosis not present

## 2021-12-14 DIAGNOSIS — K651 Peritoneal abscess: Secondary | ICD-10-CM | POA: Diagnosis not present

## 2021-12-14 LAB — BASIC METABOLIC PANEL
Anion gap: 10 (ref 5–15)
BUN: 7 mg/dL (ref 6–20)
CO2: 26 mmol/L (ref 22–32)
Calcium: 8.9 mg/dL (ref 8.9–10.3)
Chloride: 97 mmol/L — ABNORMAL LOW (ref 98–111)
Creatinine, Ser: 0.67 mg/dL (ref 0.44–1.00)
GFR, Estimated: >60 mL/min (ref >60)
Glucose, Bld: 106 mg/dL — ABNORMAL HIGH (ref 70–99)
Potassium: 3.6 mmol/L (ref 3.5–5.1)
Sodium: 133 mmol/L — ABNORMAL LOW (ref 135–145)

## 2021-12-14 LAB — ECHOCARDIOGRAM LIMITED
Height: 63 in
S' Lateral: 3.3 cm
Single Plane A4C EF: 49.9 %
Weight: 2344 oz

## 2021-12-14 LAB — CBC
HCT: 29.7 % — ABNORMAL LOW (ref 36.0–46.0)
Hemoglobin: 9.5 g/dL — ABNORMAL LOW (ref 12.0–15.0)
MCH: 26.5 pg (ref 26.0–34.0)
MCHC: 32 g/dL (ref 30.0–36.0)
MCV: 82.7 fL (ref 80.0–100.0)
Platelets: 756 10*3/uL — ABNORMAL HIGH (ref 150–400)
RBC: 3.59 MIL/uL — ABNORMAL LOW (ref 3.87–5.11)
RDW: 15.6 % — ABNORMAL HIGH (ref 11.5–15.5)
WBC: 15.6 10*3/uL — ABNORMAL HIGH (ref 4.0–10.5)
nRBC: 0 % (ref 0.0–0.2)

## 2021-12-14 MED ORDER — IOHEXOL 300 MG/ML  SOLN
75.0000 mL | Freq: Once | INTRAMUSCULAR | Status: AC | PRN
  Administered 2021-12-14: 75 mL via INTRAVENOUS

## 2021-12-14 NOTE — Discharge Instructions (Signed)
[facility]      [facility] [phone]  OPEN ABDOMINAL SURGERY: POST OP INSTRUCTIONS  Always review your discharge instruction sheet given to you by the facility where your surgery was performed.  IF YOU HAVE DISABILITY OR FAMILY LEAVE FORMS, YOU MUST BRING THEM TO THE OFFICE FOR PROCESSING.  PLEASE DO NOT GIVE THEM TO YOUR DOCTOR.  A prescription for pain medication may be given to you upon discharge.  Take your pain medication as prescribed, if needed.  If narcotic pain medicine is not needed, then you may take acetaminophen (Tylenol) or ibuprofen (Advil) as needed. Take your usually prescribed medications unless otherwise directed. If you need a refill on your pain medication, please contact your pharmacy. They will contact our office to request authorization.  Prescriptions will not be filled after 5pm or on week-ends. You should follow a light diet the first few days after arrival home, such as soup and crackers, pudding, etc.unless your doctor has advised otherwise. A high-fiber, low fat diet can be resumed as tolerated.   Be sure to include lots of fluids daily. Most patients will experience some swelling and bruising on the chest and neck area.  Ice packs will help.  Swelling and bruising can take several days to resolve Most patients will experience some swelling and bruising in the area of the incision. Ice pack will help. Swelling and bruising can take several days to resolve..  It is common to experience some constipation if taking pain medication after surgery.  Increasing fluid intake and taking a stool softener will usually help or prevent this problem from occurring.  A mild laxative (Milk of Magnesia or Miralax) should be taken according to package directions if there are no bowel movements after 48 hours.  You may have steri-strips (small skin tapes) in place directly over the incision.  These strips should be left on the skin for 7-10 days.  If your surgeon used skin  glue on the incision, you may shower in 24 hours.  The glue will flake off over the next 2-3 weeks.  Any sutures or staples will be removed at the office during your follow-up visit. You may find that a light gauze bandage over your incision may keep your staples from being rubbed or pulled. You may shower and replace the bandage daily. ACTIVITIES:  You may resume regular (light) daily activities beginning the next day--such as daily self-care, walking, climbing stairs--gradually increasing activities as tolerated.  You may have sexual intercourse when it is comfortable.  Refrain from any heavy lifting or straining until approved by your doctor. You may drive when you no longer are taking prescription pain medication, you can comfortably wear a seatbelt, and you can safely maneuver your car and apply brakes Return to Work: ___________________________________ You should see your doctor in the office for a follow-up appointment approximately two weeks after your surgery.  Make sure that you call for this appointment within a day or two after you arrive home to insure a convenient appointment time. OTHER INSTRUCTIONS:  _____________________________________________________________ _____________________________________________________________  WHEN TO CALL YOUR DOCTOR: Fever over 101.0 Inability to urinate Nausea and/or vomiting Extreme swelling or bruising Continued bleeding from incision. Increased pain, redness, or drainage from the incision. Difficulty swallowing or breathing Muscle cramping or spasms. Numbness or tingling in hands or feet or around lips.  The clinic staff is available to answer your questions during regular business hours.  Please don't hesitate to call and ask to speak to one of   the nurses if you have concerns.  For further questions, please visit [email or web address]  WOUND CARE: - midline dressing to be changed daily - supplies: sterile saline, gauze, scissors,  tape  - remove dressing and all packing carefully, moistening with sterile saline as needed to avoid packing/internal dressing sticking to the wound. - clean edges of skin around the wound with water/gauze, making sure there is no tape debris or leakage left on skin that could cause skin irritation or breakdown. - dampen and clean gauze with sterile saline and pack wound from wound base to skin level, making sure to take note of any possible areas of wound tracking, tunneling and packing appropriately. Wound can be packed loosely. Trim gauze to size if a whole gauze is not required. - cover wound with a dry gauze and secure with tape.  - write the date/time on the dry dressing/tape to better track when the last dressing change occurred. - change dressing as needed if leakage occurs, wound gets contaminated, or patient requests to shower. - patient may shower daily with wound open (i.e. remove all packing) and following the shower the wound should be dried and a clean dressing placed.   ----------------------------------------------------------------------------------------------------------------------------------------------------------------------------------------------- You were hospitalized for a perforated gastric ulcer.  Hospital Course: Surgery went in to wash out your abdomen. We treated you with antibiotics. You will go home on antibiotics for 5 more days. We got imaging of your heart that originally showed reduced function. We believe this was due to inflammation from your perforated gastric ulcer. We got repeat imaging of your heart that showed improved function.   Medications:  Please start taking: - Acetaminophen (2 tablets every 6 hours as needed) for pain - Amoxicillin-Clavulanate (1 tablet every 8 hours (3 times daily)) for 5 days for intra-abdominal abscess - Methocarbamol (1 tablet every 8 hours as needed) for pain - Oxycodone (1-2 tablets every 4 hours as needed) for pain -  Pantoprazole (1 tablet by mouth daily) for your gastric ulcer - Zolpidem (1 tablet by mouth at bedtime for 7 days) for sleep  Please continue taking: - Albuterol (inhale 2 puffs into lungs every 6 hours) for wheezing or shortness of breath - Diclofenac sodium (apply 2g to skin 4 times daily) for pain - Escitalopram (1 tablet by mouth daily) for anxiety - Misc Natural Products (2 capsules by mouth daily) for your overall health - Melatonin (2 capsules daily) for sleep - Metoprolol Succinate (1 tablet by mouth daily) for your heart function - Acetaminophen-guaifenesin (2 tablets daily) for cough - Oxcarbazepine (1 tablet at bedtime) for seizures - Trazodone (1 tablet daily) for sleep  Follow-up: - Please follow up with your primary care provider Dr. Alton Revere at the [facility] on 01/04/2022 at 9:30 AM. - Please follow up with General Surgery Dr. Erroll Luna on 01/01/2022 at 9:10 AM.   Address: [facility] [place] [address]  - Please follow up with Cardiology Dr. Dani Gobble Croitoru in approximately 30 days (their office will call you to schedule an appointment).

## 2021-12-14 NOTE — Progress Notes (Signed)
  Echocardiogram 2D Echocardiogram has been performed.  Kimberly Terrell 12/14/2021, 8:32 AM

## 2021-12-14 NOTE — Progress Notes (Signed)
PT Cancellation Note  Patient Details Name: Kimberly Terrell MRN: [id number] DOB: [birth year]   Cancelled Treatment:    Reason Eval/Treat Not Completed: Other (comment).  Reports she would rather have her husband take her for a walk.  Follow up at another time.   Ramond Dial 12/14/2021, 1:57 PM  Mee Hives, PT PhD Acute Rehab Dept. Number: [place] and [place]

## 2021-12-14 NOTE — Progress Notes (Signed)
10 Days Post-Op   Subjective/Chief Complaint: Baseline diarrhea.  Tolerating soft diet.  Wants JP drain out.  Going for repeat CT Scan  Objective: Vital signs in last 24 hours: Temp:  [98.1 F (36.7 C)-99.1 F (37.3 C)] 99.1 F (37.3 C) (09/18 0949) Pulse Rate:  [85-112] 88 (09/18 1135) Resp:  [14-18] 18 (09/18 0949) BP: (96-134)/(52-64) 108/57 (09/18 1132) SpO2:  [95 %-97 %] 97 % (09/18 1132) Last BM Date : 12/13/21  Intake/Output from previous day: 09/17 0701 - 09/18 0700 In: 134.1 [IV Piggyback:134.1] Out: 5 [Drains:5] Intake/Output this shift: No intake/output data recorded.  PE: GI: Soft, appropriately TTP at incision which is granulating, no purulent drainage - see below. Drain serous     Lab Results:  Recent Labs    12/13/21 0149 12/14/21 0455  WBC 17.4* 15.6*  HGB 9.7* 9.5*  HCT 30.0* 29.7*  PLT 685* 756*   BMET Recent Labs    12/13/21 0149 12/14/21 0455  NA 131* 133*  K 4.1 3.6  CL 94* 97*  CO2 25 26  GLUCOSE 110* 106*  BUN 10 7  CREATININE 0.64 0.67  CALCIUM 8.7* 8.9   PT/INR No results for input(s): "LABPROT", "INR" in the last 72 hours. ABG No results for input(s): "PHART", "HCO3" in the last 72 hours.  Invalid input(s): "PCO2", "PO2"  Studies/Results: CT ABDOMEN W CONTRAST  Result Date: 12/14/2021 CLINICAL DATA:  History of perforated gastric ulcer. Follow up abscess. EXAM: CT ABDOMEN WITH CONTRAST TECHNIQUE: Multidetector CT imaging of the abdomen was performed using the standard protocol following bolus administration of intravenous contrast. RADIATION DOSE REDUCTION: This exam was performed according to the departmental dose-optimization program which includes automated exposure control, adjustment of the mA and/or kV according to patient size and/or use of iterative reconstruction technique. CONTRAST:  18mL OMNIPAQUE IOHEXOL 300 MG/ML  SOLN COMPARISON:  Abdominal CT 12/11/2021 and 12/04/2021. Upper GI series 12/08/2021. FINDINGS: Lower  chest: Improving bibasilar atelectasis with resolved right pleural effusion. Hepatobiliary: The liver is normal in density without suspicious focal abnormality. No evidence of gallstones, gallbladder wall thickening or biliary dilatation. Pancreas: Unremarkable. No pancreatic ductal dilatation or surrounding inflammatory changes. Spleen: Normal in size without focal abnormality. Adrenals/Urinary Tract: Both adrenal glands appear normal. No evidence of urinary tract calculus, suspicious renal lesion or hydronephrosis. The bladder appears unremarkable for its degree of distention. Stomach/Bowel: No enteric contrast administered. The stomach appears unremarkable for its degree of distention. Scattered dilated loops of small bowel are again noted, minimally improved from recent CT. No focal transition point identified. There is gas and fluid within the colon. No bowel wall thickening identified. No evidence of appendicitis. Vascular/Lymphatic: There are no enlarged abdominal or pelvic lymph nodes. No significant vascular findings. Reproductive: The uterus and ovaries appear normal. No adnexal mass. Other: Unchanged surgical peritoneal drainage catheter. Scattered small rim enhancing fluid collections are again noted, stable to slightly improved from the prior examination. A tubular component anterior to the descending colon has improved, measuring up to 4.6 cm in length on image 132/7 (previously 7.6 cm). Left subphrenic and small right pelvic components are not significantly changed. No new or enlarging fluid collections. No ascites or free air. Midline abdominal incision is incompletely closed superiorly. Musculoskeletal: No acute or significant osseous findings. IMPRESSION: 1. Stable to slightly improved small peritoneal fluid collections with rim enhancement. No new or enlarging collections are identified. 2. Slight improvement in small bowel distension, most likely reflecting an ileus. 3. Interval improved aeration  of the  lung bases. 4. No parenchymal abnormalities. Electronically Signed   By: Richardean Sale M.D.   On: 12/14/2021 10:53   ECHOCARDIOGRAM LIMITED  Result Date: 12/14/2021    ECHOCARDIOGRAM LIMITED REPORT   Patient Name:   Kimberly Terrell Date of Exam: 12/14/2021 Medical Rec #:  [id number]         Height:       63.0 in Accession #:    [id number]        Weight:       146.5 lb Date of Birth:  07-14-1975         BSA:          1.694 m Patient Age:    46 years          BP:           106/62 mmHg Patient Gender: F                 HR:           93 bpm. Exam Location:  Inpatient Procedure: Limited Echo Indications:    Nonschemic cardiomyopathy  History:        Patient has prior history of Echocardiogram examinations, most                 recent 12/04/2021.  Sonographer:    Eartha Inch Referring Phys: Ennius.Cumber KENNETH C HILTY  Sonographer Comments: Technically challenging study due to limited acoustic windows. Image acquisition challenging due to respiratory motion and Image acquisition challenging due to patient body habitus. Subcostal could not be attempted due to surgical bandages covering imaging window. IMPRESSIONS  1. Left ventricular ejection fraction, by estimation, is 55 to 60%. The left ventricle has normal function. The left ventricle has no regional wall motion abnormalities  2. Side by Side images were compared from prior study 12/04/21 showing improved LVF. FINDINGS  Left Ventricle: Left ventricular ejection fraction, by estimation, is 55 to 60%. The left ventricle has normal function. The left ventricle has no regional wall motion abnormalities. The left ventricular internal cavity size was normal in size. There is  no left ventricular hypertrophy. Right Atrium: Right atrial size was normal in size. Pericardium: There is no evidence of pericardial effusion. IAS/Shunts: No atrial level shunt detected by color flow Doppler. LEFT VENTRICLE PLAX 2D LVIDd:         4.00 cm LVIDs:         3.30 cm LV PW:         0.70  cm LV IVS:        0.80 cm  LV Volumes (MOD) LV vol d, MOD A4C: 91.7 ml LV vol s, MOD A4C: 45.9 ml LV SV MOD A4C:     91.7 ml LEFT ATRIUM         Index LA diam:    1.90 cm 1.12 cm/m Fransico Him MD Electronically signed by Fransico Him MD Signature Date/Time: 12/14/2021/8:53:05 AM    Final     Anti-infectives: Anti-infectives (From admission, onward)    Start     Dose/Rate Route Frequency Ordered Stop   12/11/21 1800  piperacillin-tazobactam (ZOSYN) IVPB 3.375 g        3.375 g 12.5 mL/hr over 240 Minutes Intravenous Every 8 hours 12/11/21 1738     12/04/21 1445  piperacillin-tazobactam (ZOSYN) IVPB 3.375 g  Status:  Discontinued        3.375 g 12.5 mL/hr over 240 Minutes Intravenous Every 8 hours 12/04/21 1357 12/04/21 1434  12/04/21 1200  piperacillin-tazobactam (ZOSYN) IVPB 3.375 g        3.375 g 12.5 mL/hr over 240 Minutes Intravenous Every 8 hours 12/04/21 1008 12/09/21 1159   12/04/21 0530  piperacillin-tazobactam (ZOSYN) IVPB 3.375 g        3.375 g 100 mL/hr over 30 Minutes Intravenous  Once 12/04/21 0517 12/04/21 3818       Assessment/Plan: POD 10 s/p exploratory laparotomy with biopsy of gastric ulcer and Graham patch repair for Perforated distal body posterior wall stomach ulcer by Dr. Luisa Hart on 12/07/21 - Surgical pathology with acute and chronic inflammation - UGI negative. Soft diet - H. Pylori negative - Cont PPI BID - DC JP drain 9/18 - completed antibiotics for 5 days postop. But restarted due to CT findings on 9/15.  Repeat CT improved today.   - BID WTD to midline wound -can likely DC home tomorrow on oral abx.    FEN: soft, IVF per medicine VTE: Lovenox, SCDs (hgb stable) ID: Zosyn 9/8 - 9/13. 9/15--> Foley: None, voiding   - Per primary -  Elevated Tn's - Cardiology following for elevated troponins, thought to be demand ischemia vs cardiomyopathy. Tn now downtrending. Not felt ideal for further ischemic work up and will peruse medical management when taking  PO - when able to take PO would start toprol 12.5mg  daily initially followed by further titration of HF medications.  NSVT Asthma/Emphysema - encourage tobacco cessation. Possible pna on imaging covered with abx Pulm nodule - F/u with primary care for imaging surveillance of pulmonary nodule   LOS: 10 days    Letha Cape, [facility] 12/14/2021, 11:42 AM Please see Amion for pager number during day hours 7:00am-4:30pm

## 2021-12-14 NOTE — Progress Notes (Addendum)
Rounding Note    Patient Name: Kimberly Terrell. [name] Date of Encounter: 12/14/2021  [facility] Cardiologist: Charlton Haws, MD   Subjective   No acute overnight events. Abdominal pain is mostly well controlled with pain medications following laparotomy. She reports she is having diarrhea which she has had for months now. No chest pain. She reports occasionally feeling short of breath but no longer on supplemental O2. O2 sats in the mid 90s. No palpitations.  Inpatient Medications    Scheduled Meds:  (feeding supplement) PROSource Plus  30 mL Oral BID BM   acetaminophen  1,000 mg Oral Q6H   dextromethorphan-guaiFENesin  1 tablet Oral BID   enoxaparin (LOVENOX) injection  40 mg Subcutaneous Q24H   escitalopram  10 mg Oral Daily   feeding supplement  1 Container Oral TID BM   metoprolol succinate  25 mg Oral Daily   multivitamin with minerals  1 tablet Oral Daily   nicotine  14 mg Transdermal Daily   mouth rinse  15 mL Mouth Rinse 4 times per day   pantoprazole  40 mg Oral Daily   sacubitril-valsartan  1 tablet Oral BID   Continuous Infusions:  methocarbamol (ROBAXIN) IV     piperacillin-tazobactam (ZOSYN)  IV 12.5 mL/hr at 12/14/21 0600   PRN Meds: albuterol, diclofenac Sodium, HYDROmorphone (DILAUDID) injection, methocarbamol (ROBAXIN) IV, neomycin-bacitracin-polymyxin, ondansetron **OR** ondansetron (ZOFRAN) IV, mouth rinse, oxyCODONE, phenol, prochlorperazine, simethicone, zolpidem   Vital Signs    Vitals:   12/13/21 1743 12/13/21 2030 12/13/21 2032 12/14/21 0317  BP: (!) 134/57 109/64 109/64 106/62  Pulse: (!) 112 (!) 109 (!) 109 (!) 101  Resp: 18 16 16 14   Temp: 98.6 F (37 C) 98.1 F (36.7 C) 98.1 F (36.7 C) 99.1 F (37.3 C)  TempSrc: Oral Oral  Oral  SpO2: 95% 97%  95%  Weight:      Height:        Intake/Output Summary (Last 24 hours) at 12/14/2021 0907 Last data filed at 12/14/2021 0600 Gross per 24 hour  Intake 134.08 ml  Output 5 ml  Net  129.08 ml      12/06/2021    2:49 AM 12/04/2021   10:20 AM 09/23/2021    1:32 PM  Last 3 Weights  Weight (lbs) 146 lb 8 oz 140 lb 145 lb  Weight (kg) 66.452 kg 63.504 kg 65.772 kg      Telemetry    Sinus rhythm with with rates in the 90s at rest but will get as high as the 120s to 130s (suspect this is with ambulation or more activity). PVC noted (sometimes in trigeminy pattern). Rare ventricular couplets also noted. - Personally Reviewed  ECG    No new ECG tracing today. - Personally Reviewed  Physical Exam   GEN: No acute distress.   Neck: No JVD. Cardiac: Borderline tachycardia with normal rhythm. No murmurs, rubs, or gallops.  Respiratory: Clear to auscultation bilaterally. No wheezes, rhonchi, or rales. GI: Soft and tender with soft palpation of epigastric area. MS: No lower extremity edema. No deformity. Skin: Warm and dry. Neuro:  No focal deficits. Psych: Normal affect. Responds appropriately.  Labs    High Sensitivity Troponin:   Recent Labs  Lab 12/04/21 2155 12/05/21 0034 12/05/21 0832 12/05/21 1035 12/06/21 0701  TROPONINIHS 7,608* 9,474* 10,518* 9,799* 6,061*     Chemistry Recent Labs  Lab 12/08/21 02/07/22 12/09/21 12/11/21 12/10/21 0242 12/12/21 0233 12/13/21 0149 12/14/21 0455  NA 134* 134*   < >  131* 131* 133*  K 3.3* 3.3*   < > 3.3* 4.1 3.6  CL 98 93*   < > 94* 94* 97*  CO2 20* 25   < > 24 25 26   GLUCOSE 73 117*   < > 109* 110* 106*  BUN 5* <5*   < > 5* 10 7  CREATININE 0.73 0.70   < > 0.57 0.64 0.67  CALCIUM 8.5* 8.7*   < > 8.6* 8.7* 8.9  MG 1.9 1.9  --  1.8  --   --   GFRNONAA >60 >60   < > >60 >60 >60  ANIONGAP 16* 16*   < > 13 12 10    < > = values in this interval not displayed.    Lipids  Recent Labs  Lab 12/12/21 0233  CHOL 133  TRIG 171*  HDL 17*  LDLCALC 82  CHOLHDL 7.8    Hematology Recent Labs  Lab 12/12/21 0233 12/13/21 0149 12/14/21 0455  WBC 22.0* 17.4* 15.6*  RBC 3.92 3.69* 3.59*  HGB 10.3* 9.7* 9.5*  HCT 31.7*  30.0* 29.7*  MCV 80.9 81.3 82.7  MCH 26.3 26.3 26.5  MCHC 32.5 32.3 32.0  RDW 15.3 15.5 15.6*  PLT 741* 685* 756*   Thyroid No results for input(s): "TSH", "FREET4" in the last 168 hours.  BNPNo results for input(s): "BNP", "PROBNP" in the last 168 hours.  DDimer No results for input(s): "DDIMER" in the last 168 hours.   Radiology    ECHOCARDIOGRAM LIMITED  Result Date: 12/14/2021    ECHOCARDIOGRAM LIMITED REPORT   Patient Name:   Kimberly Terrell Date of Exam: 12/14/2021 Medical Rec #:  [id number]         Height:       63.0 in Accession #:    [id number]        Weight:       146.5 lb Date of Birth:  July 12, 1975         BSA:          1.694 m Patient Age:    46 years          BP:           106/62 mmHg Patient Gender: F                 HR:           93 bpm. Exam Location:  Inpatient Procedure: Limited Echo Indications:    Nonschemic cardiomyopathy  History:        Patient has prior history of Echocardiogram examinations, most                 recent 12/04/2021.  Sonographer:    Eartha Inch Referring Phys: Ennius.Cumber KENNETH C HILTY  Sonographer Comments: Technically challenging study due to limited acoustic windows. Image acquisition challenging due to respiratory motion and Image acquisition challenging due to patient body habitus. Subcostal could not be attempted due to surgical bandages covering imaging window. IMPRESSIONS  1. Left ventricular ejection fraction, by estimation, is 55 to 60%. The left ventricle has normal function. The left ventricle has no regional wall motion abnormalities  2. Side by Side images were compared from prior study 12/04/21 showing improved LVF. FINDINGS  Left Ventricle: Left ventricular ejection fraction, by estimation, is 55 to 60%. The left ventricle has normal function. The left ventricle has no regional wall motion abnormalities. The left ventricular internal cavity size was normal in size. There is  no left ventricular  hypertrophy. Right Atrium: Right atrial size was normal  in size. Pericardium: There is no evidence of pericardial effusion. IAS/Shunts: No atrial level shunt detected by color flow Doppler. LEFT VENTRICLE PLAX 2D LVIDd:         4.00 cm LVIDs:         3.30 cm LV PW:         0.70 cm LV IVS:        0.80 cm  LV Volumes (MOD) LV vol d, MOD A4C: 91.7 ml LV vol s, MOD A4C: 45.9 ml LV SV MOD A4C:     91.7 ml LEFT ATRIUM         Index LA diam:    1.90 cm 1.12 cm/m Armanda Magic MD Electronically signed by Armanda Magic MD Signature Date/Time: 12/14/2021/8:53:05 AM    Final     Cardiac Studies   Complete Echocardiogram 12/04/2021: Impressions:  1. Distal septal/ apical/ inferior basal hypokinesis No obvious mural  thrombus but would consider repeating TTE after emergent surgery for  perforated gastric ulcer when HR lower and images better . Left  ventricular ejection fraction, by estimation, is  40 to 45%. The left ventricle has mildly decreased function. The left  ventricle has no regional wall motion abnormalities. Left ventricular  diastolic parameters are indeterminate.   2. Right ventricular systolic function is mildly reduced. The right  ventricular size is mildly enlarged. There is normal pulmonary artery  systolic pressure.   3. The mitral valve is normal in structure. No evidence of mitral valve  regurgitation. No evidence of mitral stenosis.   4. The aortic valve is tricuspid. Aortic valve regurgitation is not  visualized. No aortic stenosis is present.   5. The inferior vena cava is normal in size with greater than 50%  respiratory variability, suggesting right atrial pressure of 3 mmHg.  _______________  Limited Echocardiogram 12/14/2021: Impressions:  1. Left ventricular ejection fraction, by estimation, is 55 to 60%. The  left ventricle has normal function. The left ventricle has no regional  wall motion abnormalities   2. Side by Side images were compared from prior study 12/04/21 showing  improved LVF.    Patient Profile     46 y.o.  female with a history of sinus tachycardia, H. Pylori (treated x2), and anxiety/depression who was admitted on 12/03/2021 for peritonitis with a perforated gastric ulcer after presenting with severe diffuse abdominal pain. S/p exploratory laparotomy with closure of posterior gastric wall ulcer and biopsy of ulcer on 12/04/2021. Cardiology was consulted for elevated troponin and EKG changes. Initial Echo on 12/04/2021 showed LVEF of 40-45% with distal septal, apical, and inferior basal hypokinesis. Highly suspected to stress-induced cardiomyopathy. Repeat Echo on 12/14/2021 showed LVEF of 55-60% with no regional wall motion abnormalities.  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 High-sensitivity troponin peaked at 10,508. EKG on 9/8 showed ST elevation of all inferior leads and V4-V6. However, these changes had resolved on 9/9. Echo on 9/8 showed LVEF of 40-45% with distal septal, apical, and inferior basal hypokinesis. However, repeat Echo today showed improvement of LVEF to 55-60% with no regional wall motions abnormalities.  - No chest pain.  - No Aspirin due to recent surgery and PUD.  - LDL 82. ASCVD 10 year risk is low at <5%. Will discuss adding statin with MD. - Strongly suspected to be  type 2 NSTEMI (demand ischemia) from severe systemic illness rather than true ACS. Can consider outpatient ischemic evaluation with coronary CTA once she recovers from acute illness.  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 However, repeat Echo today showed improvement of LVEF to 55-60% with no regional wall motions abnormalities. Presumed stress induced cardiomyopathy from severe systemic illness. - Euvolemic on exam. No longer hypoxic. - No Lasix needed  at this time but can use PRN for worsening hypoxia or worsening edema/rapid weight gain. - Continue Entresto 24-26mg  twice daily. - Continue Toprol-XL 25mg  daily. - Given quick normalization of EF and soft BP, will hold on adding any additional GDMT such as SGLT2 inhibitor or Spironolactone. - Continue to monitor daily weights, strict I/Os, and renal function.  Non-Sustained VT History of Sinus Tachycardia Telemetry shows sinus rhythm with baseline rates in the 90s. Occasional PVCs and ventricular couplets noted but no longer runs of NSVT. - Continue Toprol-XL 25mg  daily.  Dyslipidemia Lipid panel this admission: Total Cholesterol 133, Triglycerides 171, HDL 17, LDL 82.  - ASCVD 10 year risk is low at <5%. Will discuss adding statin with MD.  Otherwise, per primary team: - Peritonitis with perforated gastric ulcer - Anemia - Asthma - Anxiety/depression - Tobacco abuse   For questions or updates, please contact [facility] Please consult [email or web address] for contact info under        Signed, , PA-C  12/14/2021, 9:07 AM    I have seen and examined the patient along with Corrin Parker, PA-C .  I have reviewed the chart, notes and new data.  I agree with PA/NP's note.  Key new complaints: No shortness of breath and no chest pain at rest or with light activity, but becomes quite tachycardic with activity Key examination changes: Normal cardiovascular exam.  Monitor shows only sinus tachycardia Key new findings / data: Remarkable improvement in LV function, back to normal.  Note absence of vascular atherosclerotic calcifications on CT angiogram of the chest abdomen and pelvis on 12/04/2021  PLAN: Progression seems highly compatible with Takotsubo syndrome following perforated gastric ulcer and peritonitis.  Complete recovery of LVEF and no active signs of heart failure.  Recommend stopping the Entresto.  We will continue the beta-blocker for the time being.   It still reasonable to perform an outpatient evaluation for coronary artery disease with a coronary CT angiogram once she is over the acute illness.  Do not think we will get good images now since it will be hard to achieve a slow enough heart rate.  Corrin Parker, MD, [facility] [phone] 12/14/2021, 12:07 PM

## 2021-12-14 NOTE — Progress Notes (Signed)
Subjective:  Overnight Events: none  Patient denies any abdominal pain, nausea, vomiting, fever or chills. States she is still having some diarrhea, unchanged from her chronic diarrhea. Has been tolerating soft diet well. States she is okay with advancing her diet today. Discussed plans for repeat CT scan today which patient was agreeable to.   Objective:  Vital signs in last 24 hours: Vitals:   12/13/21 2032 12/14/21 0317 12/14/21 0945 12/14/21 0949  BP: 109/64 106/62 (!) 96/52 (!) 96/52  Pulse: (!) 109 (!) 101 (!) 101 100  Resp: 16 14  18   Temp: 98.1 F (36.7 C) 99.1 F (37.3 C)  99.1 F (37.3 C)  TempSrc:  Oral    SpO2:  95%    Weight:      Height:       Weight change:   Intake/Output Summary (Last 24 hours) at 12/14/2021 1032 Last data filed at 12/14/2021 0600 Gross per 24 hour  Intake 134.08 ml  Output 5 ml  Net 129.08 ml   Physical Exam: General: resting in bed comfortably, not in acute distress  Respiratory: normal respiratory effort, no wheezes, rales, rhonchi, or crackles Cardiovascular: tachycardic, regular rhythm, no murmurs, rubs, or gallops, no LE edema Gastrointestinal: minimal distension, surgical incision site is clean, dry, and intact without erythema, swelling, or drainage Psych: smiling, normal mood and affect  Assessment/Plan:  Principal Problem:   Perforated viscus Active Problems:   Gastric ulcer   Demand ischemia (HCC)  Kimberly Terrell is 46 y/o female with a PMH of anxiety, arthritis, asthma (well controlled), h/o H pylori and PUD who presented with acutely worsened abdominal pain and admitted for perforated gastric ulcer s/p xlap and repair.  #Perforated gastric ulcer  #post operative ileus #small intra-abdominal abscesses Abdominal pain continues to improve. Tolerating soft diet well. Will transition to normal diet today. Leukocytosis improved today. Remains afebrile. CT abd today demonstrates stable to slightly improved peritoneal fluid  collections w/o new collections. Surgery to remove JP drain today. Will continue Zosyn for now and transition to oral antibiotics tomorrow. Encouraging PO medications for pain control as we hope to transition off of IV pain medications soon.  > Zosyn (day 4) > Protonix > Zofran and compazine for nausea > Tylenol PRN, Dilaudid 0.5 mg IV q4 PRN, oxycodone 5-10 mg  q4 PRN for pain  #Acute Heart Failure with mildly reduced EF #Elevated troponins and diffuse ST elevations #Vtach Asymptomatic episodes of Vtach on 9/9 and 9/16. Remains tachycardic. Suspect due to inflammation from abscesses. Suspect elevated trops/decreased EF secondary to stress induced cardiomyopathy from perforated gastric ulcer. Echo today demonstrates EF 55-60% with improved LV function.  >Metoprolol succinate > Entresto > Terrell/Os > Cardiac monitoring  #Diarrhea Still having diarrhea. Chronic condition. Total IgA and tissue transglutaminase WNL.   >Simethicone  >Recommend outpatient GI f/u  #Asthma  #Cough # Incidental Pulmonary Nodule # Tobacco use Hx of asthma and tobacco use. 4 mm Left UL pulmonary nodule on CT. No increased oxygen requirements. On RA.  > Incentive spirometer  > Home Albuterol  > Mucinex  > Nicotine patch  > PFTs as outpatient  > Recommend imaging surveillance of pulmonary nodule as outpatient   #Arthritis  > Rheumatology f/u as outpatient > Avoiding NSAIDs (for perforated ulcer) > Methocarbamol 500 mg  > Voltaren gel  Diet: Regular Bowel: none VTE: Lovenox IVF: none Code: Full code  Prior to Admission Living Arrangement: home with significant other Anticipated Discharge Location: home Barriers to Discharge: continued management  Dispo: Anticipated discharge in approximately more than 2 day(s).    LOS: 10 days   Kimberly Terrell, Medical Student 12/14/2021, 10:32 AM   Attestation for Student Documentation:  Terrell personally was present and performed or re-performed the history,  physical exam and medical decision-making activities of this service and have verified that the service and findings are accurately documented in the student's note.  Kimberly Skeans, MD 12/14/2021, 4:10 PM

## 2021-12-15 ENCOUNTER — Other Ambulatory Visit: Payer: Self-pay | Admitting: Cardiology

## 2021-12-15 DIAGNOSIS — R198 Other specified symptoms and signs involving the digestive system and abdomen: Secondary | ICD-10-CM | POA: Diagnosis not present

## 2021-12-15 DIAGNOSIS — R079 Chest pain, unspecified: Secondary | ICD-10-CM

## 2021-12-15 LAB — BASIC METABOLIC PANEL
Anion gap: 10 (ref 5–15)
BUN: 5 mg/dL — ABNORMAL LOW (ref 6–20)
CO2: 27 mmol/L (ref 22–32)
Calcium: 8.9 mg/dL (ref 8.9–10.3)
Chloride: 95 mmol/L — ABNORMAL LOW (ref 98–111)
Creatinine, Ser: 0.74 mg/dL (ref 0.44–1.00)
GFR, Estimated: >60 mL/min (ref >60)
Glucose, Bld: 103 mg/dL — ABNORMAL HIGH (ref 70–99)
Potassium: 3.1 mmol/L — ABNORMAL LOW (ref 3.5–5.1)
Sodium: 132 mmol/L — ABNORMAL LOW (ref 135–145)

## 2021-12-15 LAB — CBC
HCT: 30.3 % — ABNORMAL LOW (ref 36.0–46.0)
Hemoglobin: 10 g/dL — ABNORMAL LOW (ref 12.0–15.0)
MCH: 26.9 pg (ref 26.0–34.0)
MCHC: 33 g/dL (ref 30.0–36.0)
MCV: 81.5 fL (ref 80.0–100.0)
Platelets: 938 10*3/uL (ref 150–400)
RBC: 3.72 MIL/uL — ABNORMAL LOW (ref 3.87–5.11)
RDW: 15.5 % (ref 11.5–15.5)
WBC: 13.5 10*3/uL — ABNORMAL HIGH (ref 4.0–10.5)
nRBC: 0 % (ref 0.0–0.2)

## 2021-12-15 LAB — IRON AND TIBC
Iron: 25 ug/dL — ABNORMAL LOW (ref 28–170)
Saturation Ratios: 8 % — ABNORMAL LOW (ref 10.4–31.8)
TIBC: 311 ug/dL (ref 250–450)
UIBC: 286 ug/dL

## 2021-12-15 LAB — FERRITIN: Ferritin: 54 ng/mL (ref 11–307)

## 2021-12-15 LAB — MAGNESIUM: Magnesium: 1.9 mg/dL (ref 1.7–2.4)

## 2021-12-15 MED ORDER — METOPROLOL SUCCINATE ER 25 MG PO TB24: 25.0000 mg | ORAL_TABLET | Freq: Every day | ORAL | 0 refills | Status: DC

## 2021-12-15 MED ORDER — OXYCODONE HCL 5 MG PO TABS: 5.0000 mg | ORAL_TABLET | ORAL | 0 refills | Status: DC | PRN

## 2021-12-15 MED ORDER — POTASSIUM CHLORIDE CRYS ER 20 MEQ PO TBCR
40.0000 meq | EXTENDED_RELEASE_TABLET | Freq: Two times a day (BID) | ORAL | Status: DC
  Administered 2021-12-15: 40 meq via ORAL
  Filled 2021-12-15: qty 2

## 2021-12-15 MED ORDER — AMOXICILLIN-POT CLAVULANATE 500-125 MG PO TABS: 1.0000 | ORAL_TABLET | Freq: Three times a day (TID) | ORAL | 0 refills | Status: DC

## 2021-12-15 MED ORDER — ZOLPIDEM TARTRATE 5 MG PO TABS: 5.0000 mg | ORAL_TABLET | Freq: Every evening | ORAL | 0 refills | Status: DC | PRN

## 2021-12-15 MED ORDER — PANTOPRAZOLE SODIUM 40 MG PO TBEC: 40.0000 mg | DELAYED_RELEASE_TABLET | Freq: Every day | ORAL | 0 refills | Status: DC

## 2021-12-15 MED ORDER — METHOCARBAMOL 500 MG PO TABS: 500.0000 mg | ORAL_TABLET | Freq: Three times a day (TID) | ORAL | 0 refills | Status: DC | PRN

## 2021-12-15 MED ORDER — ACETAMINOPHEN 500 MG PO TABS: 1000.0000 mg | ORAL_TABLET | Freq: Four times a day (QID) | ORAL | 0 refills | Status: AC | PRN

## 2021-12-15 NOTE — Progress Notes (Signed)
11 Days Post-Op   Subjective/Chief Complaint: Baseline diarrhea.  Tolerating soft diet.  No new complaints.  Objective: Vital signs in last 24 hours: Temp:  [98 F (36.7 C)-98.5 F (36.9 C)] 98 F (36.7 C) (09/19 0417) Pulse Rate:  [84-100] 100 (09/19 0417) Resp:  [18] 18 (09/19 0417) BP: (108-119)/(57-65) 119/65 (09/19 0417) SpO2:  [95 %-97 %] 97 % (09/19 0417) Last BM Date : 12/14/21  Intake/Output from previous day: 09/18 0701 - 09/19 0700 In: 65.9 [IV Piggyback:65.9] Out: -  Intake/Output this shift: No intake/output data recorded.  PE: GI: Soft, appropriately TTP at incision which is granulating. Drain removed and site clean  Lab Results:  Recent Labs    12/14/21 0455 12/15/21 0554  WBC 15.6* 13.5*  HGB 9.5* 10.0*  HCT 29.7* 30.3*  PLT 756* 938*   BMET Recent Labs    12/14/21 0455 12/15/21 0554  NA 133* 132*  K 3.6 3.1*  CL 97* 95*  CO2 26 27  GLUCOSE 106* 103*  BUN 7 5*  CREATININE 0.67 0.74  CALCIUM 8.9 8.9   PT/INR No results for input(s): "LABPROT", "INR" in the last 72 hours. ABG No results for input(s): "PHART", "HCO3" in the last 72 hours.  Invalid input(s): "PCO2", "PO2"  Studies/Results: CT ABDOMEN W CONTRAST  Result Date: 12/14/2021 CLINICAL DATA:  History of perforated gastric ulcer. Follow up abscess. EXAM: CT ABDOMEN WITH CONTRAST TECHNIQUE: Multidetector CT imaging of the abdomen was performed using the standard protocol following bolus administration of intravenous contrast. RADIATION DOSE REDUCTION: This exam was performed according to the departmental dose-optimization program which includes automated exposure control, adjustment of the mA and/or kV according to patient size and/or use of iterative reconstruction technique. CONTRAST:  13mL OMNIPAQUE IOHEXOL 300 MG/ML  SOLN COMPARISON:  Abdominal CT 12/11/2021 and 12/04/2021. Upper GI series 12/08/2021. FINDINGS: Lower chest: Improving bibasilar atelectasis with resolved right pleural  effusion. Hepatobiliary: The liver is normal in density without suspicious focal abnormality. No evidence of gallstones, gallbladder wall thickening or biliary dilatation. Pancreas: Unremarkable. No pancreatic ductal dilatation or surrounding inflammatory changes. Spleen: Normal in size without focal abnormality. Adrenals/Urinary Tract: Both adrenal glands appear normal. No evidence of urinary tract calculus, suspicious renal lesion or hydronephrosis. The bladder appears unremarkable for its degree of distention. Stomach/Bowel: No enteric contrast administered. The stomach appears unremarkable for its degree of distention. Scattered dilated loops of small bowel are again noted, minimally improved from recent CT. No focal transition point identified. There is gas and fluid within the colon. No bowel wall thickening identified. No evidence of appendicitis. Vascular/Lymphatic: There are no enlarged abdominal or pelvic lymph nodes. No significant vascular findings. Reproductive: The uterus and ovaries appear normal. No adnexal mass. Other: Unchanged surgical peritoneal drainage catheter. Scattered small rim enhancing fluid collections are again noted, stable to slightly improved from the prior examination. A tubular component anterior to the descending colon has improved, measuring up to 4.6 cm in length on image 132/7 (previously 7.6 cm). Left subphrenic and small right pelvic components are not significantly changed. No new or enlarging fluid collections. No ascites or free air. Midline abdominal incision is incompletely closed superiorly. Musculoskeletal: No acute or significant osseous findings. IMPRESSION: 1. Stable to slightly improved small peritoneal fluid collections with rim enhancement. No new or enlarging collections are identified. 2. Slight improvement in small bowel distension, most likely reflecting an ileus. 3. Interval improved aeration of the lung bases. 4. No parenchymal abnormalities. Electronically  Signed   By: Chrissie Noa  Purcell Mouton M.D.   On: 12/14/2021 10:53   ECHOCARDIOGRAM LIMITED  Result Date: 12/14/2021    ECHOCARDIOGRAM LIMITED REPORT   Patient Name:   Kimberly Terrell Date of Exam: 12/14/2021 Medical Rec #:  [id number]         Height:       63.0 in Accession #:    [id number]        Weight:       146.5 lb Date of Birth:  [birth year]         BSA:          1.694 m Patient Age:    46 years          BP:           106/62 mmHg Patient Gender: F                 HR:           93 bpm. Exam Location:  Inpatient Procedure: Limited Echo Indications:    Nonschemic cardiomyopathy  History:        Patient has prior history of Echocardiogram examinations, most                 recent 12/04/2021.  Sonographer:    Milda Smart Referring Phys: Phyllis.Radar KENNETH C HILTY  Sonographer Comments: Technically challenging study due to limited acoustic windows. Image acquisition challenging due to respiratory motion and Image acquisition challenging due to patient body habitus. Subcostal could not be attempted due to surgical bandages covering imaging window. IMPRESSIONS  1. Left ventricular ejection fraction, by estimation, is 55 to 60%. The left ventricle has normal function. The left ventricle has no regional wall motion abnormalities  2. Side by Side images were compared from prior study 12/04/21 showing improved LVF. FINDINGS  Left Ventricle: Left ventricular ejection fraction, by estimation, is 55 to 60%. The left ventricle has normal function. The left ventricle has no regional wall motion abnormalities. The left ventricular internal cavity size was normal in size. There is  no left ventricular hypertrophy. Right Atrium: Right atrial size was normal in size. Pericardium: There is no evidence of pericardial effusion. IAS/Shunts: No atrial level shunt detected by color flow Doppler. LEFT VENTRICLE PLAX 2D LVIDd:         4.00 cm LVIDs:         3.30 cm LV PW:         0.70 cm LV IVS:        0.80 cm  LV Volumes (MOD) LV vol d, MOD A4C:  91.7 ml LV vol s, MOD A4C: 45.9 ml LV SV MOD A4C:     91.7 ml LEFT ATRIUM         Index LA diam:    1.90 cm 1.12 cm/m Armanda Magic MD Electronically signed by Armanda Magic MD Signature Date/Time: 12/14/2021/8:53:05 AM    Final     Anti-infectives: Anti-infectives (From admission, onward)    Start     Dose/Rate Route Frequency Ordered Stop   12/11/21 1800  piperacillin-tazobactam (ZOSYN) IVPB 3.375 g        3.375 g 12.5 mL/hr over 240 Minutes Intravenous Every 8 hours 12/11/21 1738     12/04/21 1445  piperacillin-tazobactam (ZOSYN) IVPB 3.375 g  Status:  Discontinued        3.375 g 12.5 mL/hr over 240 Minutes Intravenous Every 8 hours 12/04/21 1357 12/04/21 1434   12/04/21 1200  piperacillin-tazobactam (ZOSYN) IVPB 3.375 g  3.375 g 12.5 mL/hr over 240 Minutes Intravenous Every 8 hours 12/04/21 1008 12/09/21 1159   12/04/21 0530  piperacillin-tazobactam (ZOSYN) IVPB 3.375 g        3.375 g 100 mL/hr over 30 Minutes Intravenous  Once 12/04/21 0517 12/04/21 1017       Assessment/Plan: POD 11 s/p exploratory laparotomy with biopsy of gastric ulcer and Graham patch repair for Perforated distal body posterior wall stomach ulcer by Dr. Brantley Stage on 12/07/21 - Surgical pathology with acute and chronic inflammation - UGI negative. Soft diet - H. Pylori negative - Cont PPI BID - DC JP drain 9/18 - completed antibiotics for 5 days postop. But restarted due to CT findings on 9/15.  Repeat CT improved today.  Would treat with total of 7 days of abx therapy.  May transition to oral abx and DC home today from surgical standpoint.  Discussed with primary service. - WTD to midline wound, husband to do at home -follow up arranged.    FEN: soft, IVF per medicine VTE: Lovenox, SCDs (hgb stable) ID: Zosyn 9/8 - 9/13. 9/15--> Foley: None, voiding   - Per primary -  Elevated Tn's - Cardiology following for elevated troponins, thought to be demand ischemia vs cardiomyopathy. Tn now downtrending. Not  felt ideal for further ischemic work up and will persue medical management when taking PO - when able to take PO would start toprol 12.5mg  daily initially followed by further titration of HF medications.  NSVT Asthma/Emphysema - encourage tobacco cessation. Possible pna on imaging covered with abx Pulm nodule - F/u with primary care for imaging surveillance of pulmonary nodule   LOS: 11 days    Henreitta Cea, [facility] Surgery 12/15/2021, 10:09 AM Please see Amion for pager number during day hours 7:00am-4:30pm

## 2021-12-15 NOTE — Progress Notes (Signed)
No cardiac events.  Maintaining normal sinus rhythm.  Will sign off [place] will sign off.   Medication Recommendations: Metoprolol succinate 25 mg once daily; aspirin 81 mg daily if okay with surgery Other recommendations (labs, testing, etc): We will make arrangements for outpatient coronary CT angiogram Follow up as an outpatient: We will schedule follow-up in approximately 30 days.

## 2021-12-15 NOTE — Progress Notes (Signed)
Dressing changed by husband with nurse supervision. Husband performed dressing change adequately. Husband and wife state they both feel comfortable performing dressing change. All questions answered. Patient given extra supplies per MD order.

## 2021-12-15 NOTE — Discharge Summary (Signed)
Name: Kimberly Terrell. Laible MRN: [id number] DOB: 1975-06-12 46 y.o. PCP: [facility]  Date of Admission: 12/03/2021  9:49 PM Date of Discharge: 12/15/2021  2:36 PM Attending Physician: No att. providers found  Discharge Diagnosis: 1. Principal Problem:   Perforated viscus Active Problems:   Gastric ulcer   Demand ischemia (HCC) Intra-Abdominal Abscess Acute Heart Failure with mildly reduced EF Asymptomatic Ventricular Tachycardia Incidental Pulmonary Nodule Arthritis Thrombocytosis  Discharge Medications: Allergies as of 12/15/2021       Reactions   Imitrex [sumatriptan] Other (See Comments)   "Gave me lockjaw"   Tape Other (See Comments)   The "plastic-like tape causes redness and irritation"   Aspirin Other (See Comments)   Caused chest pain        Medication List     TAKE these medications    acetaminophen 500 MG tablet Commonly known as: TYLENOL Take 2 tablets (1,000 mg total) by mouth every 6 (six) hours as needed.   albuterol 108 (90 Base) MCG/ACT inhaler Commonly known as: VENTOLIN HFA Inhale 2 puffs into the lungs every 6 (six) hours as needed for wheezing or shortness of breath.   amoxicillin-clavulanate 500-125 MG tablet Commonly known as: Augmentin Take 1 tablet (500 mg total) by mouth 3 (three) times daily.   diclofenac sodium 1 % Gel Commonly known as: VOLTAREN Apply 2 g topically 4 (four) times daily. What changed:  when to take this reasons to take this   escitalopram 10 MG tablet Commonly known as: LEXAPRO Take 10 mg by mouth daily.   GLUCOSAMINE CHOND CMP ADVANCED PO Take 2 capsules by mouth daily.   Melatonin 10 MG Caps Take 20 mg by mouth at bedtime.   methocarbamol 500 MG tablet Commonly known as: ROBAXIN Take 1 tablet (500 mg total) by mouth every 8 (eight) hours as needed for muscle spasms.   metoprolol succinate 25 MG 24 hr tablet Commonly known as: TOPROL-XL Take 1 tablet (25 mg total) by mouth daily. Please  make overdue appt with Dr. Johney Frame before anymore refills. Thank you 2nd attempt   Mucinex Cold & Flu 325-200 MG Caps Generic drug: Acetaminophen-guaiFENesin Take 2 tablets by mouth daily as needed (cold symptoms).   Oxcarbazepine 300 MG tablet Commonly known as: TRILEPTAL TAKE 1 TABLET BY MOUTH AT BEDTIME What changed:  how much to take how to take this when to take this additional instructions   oxyCODONE 5 MG immediate release tablet Commonly known as: Oxy IR/ROXICODONE Take 1-2 tablets (5-10 mg total) by mouth every 4 (four) hours as needed for moderate pain.   pantoprazole 40 MG tablet Commonly known as: PROTONIX Take 1 tablet (40 mg total) by mouth daily.   traZODone 50 MG tablet Commonly known as: DESYREL Take 50 mg by mouth at bedtime.   zolpidem 5 MG tablet Commonly known as: AMBIEN Take 1 tablet (5 mg total) by mouth at bedtime as needed for up to 7 days for sleep.               Discharge Care Instructions  (From admission, onward)           Start     Ordered   12/15/21 0000  Discharge wound care:       Comments: WOUND CARE: - midline dressing to be changed daily - supplies: sterile saline, gauze, scissors, tape  - remove dressing and all packing carefully, moistening with sterile saline as needed to avoid packing/internal dressing sticking to the wound. - clean  edges of skin around the wound with water/gauze, making sure there is no tape debris or leakage left on skin that could cause skin irritation or breakdown. - dampen and clean gauze with sterile saline and pack wound from wound base to skin level, making sure to take note of any possible areas of wound tracking, tunneling and packing appropriately. Wound can be packed loosely. Trim gauze to size if a whole gauze is not required. - cover wound with a dry gauze and secure with tape.  - write the date/time on the dry dressing/tape to better track when the last dressing change occurred. - change  dressing as needed if leakage occurs, wound gets contaminated, or patient requests to shower. - patient may shower daily with wound open (i.e. remove all packing) and following the shower the wound should be dried and a clean dressing placed.   12/15/21 1348            Disposition and follow-up:   Ms.Mialee R. Greenlief was discharged from [facility] in Good condition.  At the hospital follow up visit please address:  1.    A. Perforated gastric ulcer   B. Small intra-abdominal abscesses  C. Acute Heart Failure with mildly reduced EF  D. Asymptomatic Ventricular Tachycardia  E. Incidental Pulmonary Nodule  F. Thrombocytosis  2.  Labs / imaging needed at time of follow-up: CBC, BMP  3.  Pending labs/ test needing follow-up: blood smear  Follow-up Appointments:  Follow-up Information     Harriette Bouillon, MD Follow up on 01/01/2022.   Specialty: General Surgery Why: 9:10am, Arrive 30 minutes prior to your appointment time, Please bring your insurance card and photo ID Contact information: [address] [place] [address] [phone]                 Hospital Course by problem list:  46 yo female with a PMH of anxiety, arthritis, asthma (well controlled), h/o H pylori and PUD who presents with acutely worsened abdominal pain and admitted for perforated gastric ulcer.    #Perforated viscus  #Post-op Fever Presented with acutely worsening abdominal pain, nausea, and vomiting and was found to have perforated ulcer of the posterior gastric wall on CT. Ex-lap w/ biopsy of gastric ulcer on 9/8. Had JP drain that continued to drain serous fluid. Became febilre overnight on 9/10 w/ increase O2 requirements and productive cough. Was found to have new moderate bilateral pleural effusions w/ atelectasis and consolidation on CXR. Breathing improved after D5-LR drip was discontinued and IV lasix 20 mg given. Suspected respiratory symptoms were  secondary to IV fluids and mild acute HFrEF. Fever resolved w/ tylenol and remained febrile thereafter. Patient had normal UGI series. Patient completed 6 days of IV zosyn. Developed leukocytosis prompting CT of the abdomen that demonstrated demonstrated 2 small abscesses. Patient restarted on Zosyn for 5 days for abdominal abscesses. Repeat CT demonstrated stable to slightly improved peritoneal fluid collections. Leukocytosis continued to improve. Abdominal pain continued to improve. Nausea and vomiting resolved. Patient's diet was advanced as tolerated. JP drain was removed by surgery. Patient was discharged on protonix, Augmentin for 5 days, tylenol, oxycodone, methocarbamol, and 7 day course of zolpidem for sleep.    #Pericarditis #Elevated troponins and diffuse ST elevations #Vtach Presented with chest pain and elevated troponin. Troponin peaked at 10K.  Was unable to receive a cath due to recent surgery. Echo demonstrated EF 40-45%, LV dysfunction, RV enlargement, and mild TR. 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 Continued her home metoprolol and started on entresto. Repeat echo demonstrated EF 55-60% with improved LV function. Sherryll Burger was stopped and discharged on home metoprolol. Also had episodes of asymptomatic ventricular tachycardia on 9/9 and 9/16 w/ unchanged EKG. Remained tachycardic throughout her stay but tachycardia did improve. Suspected tachycardia secondary to inflammation from abscesses.  # Diarrhea  Has chronic diarrhea history. Cryptosporidium and C. Diff. negative in 08/2021. Gastric antrum biopsy during EGD in 08/2019 demonstrated eosinophilic infiltrates but negative for H. Pylori. Total IgA and tissue transglutaminase WNL here. The cause of her diarrhea remains unclear. Recommend follow-up with GI as outpatient.   # Hypokalemia # Hypomagnesemia  Developed low potassium and low magnesium. Replenished.     #Asthma  # Incidental Pulmonary Nodule # Tobacco use Has long-term smoking history. Home albuterol continued. Given nicotine patch. 4 mm left upper lobe pulmonary nodule on CT here. Satting well on RA on day of discharge. Recommend PFTs as outpatient and imaging surveillance of pulmonary nodule. Recommend smoking cessation.   #Arthritis  Chronic condition. Avoided NSAIDs due to perforated ulcer. Recommend rheumatology f/u as outpatient.   #Thrombocytosis Platelet count continued to rise throughout her stay. Platelets 938 on day of discharge. Ferritin and TIBC normal but iron low. Will need to follow up on blood smear and repeat a CBC at follow up appointment.    Discharge Exam:   BP 113/61 (BP Location: Right Arm)   Pulse 87   Temp 97.8 F (36.6 C) (Oral)   Resp 17   Ht 5\' 3"  (1.6 m)   Wt 66.5 kg   LMP 12/05/2021   SpO2 96%   BMI 25.95 kg/m   General: resting in bed comfortably, not in acute distress  Respiratory: normal respiratory effort, no wheezes, rales, rhonchi, or crackles Cardiovascular: tachycardic, regular rhythm, no murmurs, rubs, or gallops, no LE edema Gastrointestinal: normal bowel sounds, surgical dressing in place on abdomen, no tenderness to palpation Skin: no erythema or drainage noted on abdomen Psych: smiling, normal mood and affect  Pertinent Labs, Studies, and Procedures:     Latest Ref Rng & Units 12/15/2021    5:54 AM 12/14/2021    4:55 AM 12/13/2021    1:49 AM  CBC  WBC 4.0 - 10.5 K/uL 13.5  15.6  17.4   Hemoglobin 12.0 - 15.0 g/dL 12/15/2021  9.5  9.7   Hematocrit 36.0 - 46.0 % 30.3  29.7  30.0   Platelets 150 - 400 K/uL 938  756  685        Latest Ref Rng & Units 12/15/2021    5:54 AM 12/14/2021    4:55 AM 12/13/2021    1:49 AM  BMP  Glucose 70 - 99 mg/dL 12/15/2021  798  921   BUN 6 - 20 mg/dL 5  7  10    Creatinine 0.44 - 1.00 mg/dL 194   1.74   Sodium 135 - 145 mmol/L 132  133  131   Potassium 3.5 - 5.1 mmol/L 3.1  3.6  4.1   Chloride 98 -  111 mmol/L 95  97  94   CO2 22 - 32 mmol/L 27  26  25    Calcium 8.9 - 10.3 mg/dL 8.9  8.9  8.7     DG Chest 2 View  IMPRESSION: Normal chest radiographs.   On: 12/04/2021 00:55  CT Angio Chest/Abd/Pel for Dissection W and/or Wo Contrast  IMPRESSION: 1. No evidence  for thoracic or abdominal aortic aneurysm or dissection. 2. Circumferential wall thickening in the mid esophagus suggests esophagitis. 3. Focal areas of tree-in-bud nodularity in both lungs suggest sequelae of atypical infection, including MAI. 4. Marked thickening and edema of the gastric wall with tiny extraluminal gas bubbles adjacent to the proximal stomach. While no definite or discrete ulcer can be identified, there are areas of gastric wall thinning anteriorly in the body of the stomach and posteriorly towards the antral region. Given the history of H pylori, perforated gastric ulcer would be a consideration for the intraperitoneal free air. 5. Moderate to large volume ascites with fluid around the liver and spleen. Average attenuation the fluid is relatively low, not favoring hemorrhage. 6. Possible areas of segmental hypoperfusion in the left kidney, raising the possibility of pyelonephritis. 7. There is a question of a very subtle nodule along the peritoneum of the right pelvis. After resolution of patient's acute symptoms, follow-up standard abdomen and pelvis CT with oral and intravenous contrast recommended to further evaluate. 8. 4 mm left upper lobe pulmonary nodule. No follow-up needed if patient is low-risk.This recommendation follows the consensus statement: Guidelines for Management of Incidental Pulmonary Nodules Detected on CT Images: From the Fleischner Society 2017; Radiology 2017; 284:228-243. 9. Emphysema (ICD10-J43.9).   On: 12/04/2021 05:53  DG CHEST PORT 1 VIEW  IMPRESSION: New moderate bilateral layering pleural effusions and associated atelectasis/consolidation. Pneumonia is  difficult to exclude.   New enteric tube tip in the stomach with side port near the GE junction. Consider advancement 6-7 cm to ensure side port is within the stomach.   On: 12/06/2021 20:49  DG UGI W SINGLE CM  IMPRESSION: Limited upper GI evaluation with Omnipaque.   No definitive evidence of contrast extravasation or leak seen.   On: 12/08/2021 15:08  CT ABDOMEN PELVIS W CONTRAST  IMPRESSION: 1. Multiple dilated air and contrast filled jejunal loops in the mid to upper abdomen measuring up to 5.5 cm in diameter without definite transition point. Findings likely due to postoperative ileus. 2. Rim enhancing low-density collections over the left pericolic gutter with the largest measuring 1.9 x 3.1 cm suspicious for abscess. Slight rim enhancing fluid collection over the right posterior pelvis measuring 2.1 x 3.6 cm which may represent an additional abscess. Small rim enhancing tubular collection in the retrocecal location which may represent an additional postop fluid collection and less likely the appendix. Additional small fluid collection adjacent the posterior spleen without significant rim enhancement. Percutaneous surgical drain with tip over the right upper quadrant adjacent the distal stomach.   On: 12/11/2021 16:25  CT ABDOMEN W CONTRAST  IMPRESSION: 1. Stable to slightly improved small peritoneal fluid collections with rim enhancement. No new or enlarging collections are identified. 2. Slight improvement in small bowel distension, most likely reflecting an ileus. 3. Interval improved aeration of the lung bases. 4. No parenchymal abnormalities.   On: 12/14/2021 10:53  ECHOCARDIOGRAM COMPLETE  IMPRESSIONS     1. Distal septal/ apical/ inferior basal hypokinesis No obvious mural  thrombus but would consider repeating TTE after emergent surgery for  perforated gastric ulcer when HR lower and images better . Left  ventricular ejection fraction, by estimation, is   40 to 45%. The left ventricle has mildly decreased function. The left  ventricle has no regional wall motion abnormalities. Left ventricular  diastolic parameters are indeterminate.   2. Right ventricular systolic function is mildly reduced. The right  ventricular size is mildly enlarged. There is normal pulmonary artery  systolic pressure.   3. The mitral valve is normal in structure. No evidence of mitral valve  regurgitation. No evidence of mitral stenosis.   4. The aortic valve is tricuspid. Aortic valve regurgitation is not  visualized. No aortic stenosis is present.   5. The inferior vena cava is normal in size with greater than 50%  respiratory variability, suggesting right atrial pressure of 3 mmHg.  On: 12/04/2021  ECHOCARDIOGRAM LIMITED  IMPRESSIONS     1. Left ventricular ejection fraction, by estimation, is 55 to 60%. The  left ventricle has normal function. The left ventricle has no regional  wall motion abnormalities   2. Side by Side images were compared from prior study 12/04/21 showing  improved LVF.  On: 12/14/2021  Discharge Instructions: Discharge Instructions     Call MD for:  persistant nausea and vomiting   Complete by: As directed    Call MD for:  redness, tenderness, or signs of infection (pain, swelling, redness, odor or green/yellow discharge around incision site)   Complete by: As directed    Call MD for:  severe uncontrolled pain   Complete by: As directed    Call MD for:  temperature >100.4   Complete by: As directed    Diet - low sodium heart healthy   Complete by: As directed    Discharge wound care:   Complete by: As directed    WOUND CARE: - midline dressing to be changed daily - supplies: sterile saline, gauze, scissors, tape  - remove dressing and all packing carefully, moistening with sterile saline as needed to avoid packing/internal dressing sticking to the wound. - clean edges of skin around the wound with water/gauze, making sure there  is no tape debris or leakage left on skin that could cause skin irritation or breakdown. - dampen and clean gauze with sterile saline and pack wound from wound base to skin level, making sure to take note of any possible areas of wound tracking, tunneling and packing appropriately. Wound can be packed loosely. Trim gauze to size if a whole gauze is not required. - cover wound with a dry gauze and secure with tape.  - write the date/time on the dry dressing/tape to better track when the last dressing change occurred. - change dressing as needed if leakage occurs, wound gets contaminated, or patient requests to shower. - patient may shower daily with wound open (i.e. remove all packing) and following the shower the wound should be dried and a clean dressing placed.   Increase activity slowly   Complete by: As directed       You were hospitalized for a perforated gastric ulcer.   Hospital Course: Surgery went in to wash out your abdomen. We treated you with antibiotics. You will go home on antibiotics for 5 more days. We got imaging of your heart that originally showed reduced function. We believe this was due to inflammation from your perforated gastric ulcer. We got repeat imaging of your heart that showed improved function.    Medications:   Please start taking: - Acetaminophen (2 tablets every 6 hours as needed) for pain - Amoxicillin-Clavulanate (1 tablet every 8 hours (3 times daily)) for 5 days for intra-abdominal abscess - Methocarbamol (1 tablet every 8 hours as needed) for pain - Oxycodone (1-2 tablets every 4 hours as needed) for pain - Pantoprazole (1 tablet by mouth daily) for your gastric ulcer - Zolpidem (1 tablet by mouth at bedtime for 7 days) for sleep  Please continue taking: - Albuterol (inhale 2 puffs into lungs every 6 hours) for wheezing or shortness of breath - Diclofenac sodium (apply 2g to skin 4 times daily) for pain - Escitalopram (1 tablet by mouth daily) for  anxiety - Misc Natural Products (2 capsules by mouth daily) for your overall health - Melatonin (2 capsules daily) for sleep - Metoprolol Succinate (1 tablet by mouth daily) for your heart function - Acetaminophen-guaifenesin (2 tablets daily) for cough - Oxcarbazepine (1 tablet at bedtime) for seizures - Trazodone (1 tablet daily) for sleep   Follow-up: - Please follow up with your primary care provider Dr. Peterson Lombard at the [facility] on 01/04/2022 at 9:30 AM. - Please follow up with General Surgery Dr. Harriette Bouillon on 01/01/2022 at 9:10 AM.    Address: [facility] [address] [place] [address]  - Please follow up with Cardiology Dr. Rachelle Hora Croitoru in approximately 30 days (their office will call you to schedule an appointment).   Signed: Karoline Caldwell, MD 12/15/2021, 5:20 PM   Pager: [phone]

## 2021-12-16 LAB — PATHOLOGIST SMEAR REVIEW

## 2021-12-18 LAB — MISC LABCORP TEST (SEND OUT): Labcorp test code: 70115

## 2021-12-30 ENCOUNTER — Ambulatory Visit: Admitting: Orthopedic Surgery

## 2022-01-01 ENCOUNTER — Telehealth (HOSPITAL_COMMUNITY): Payer: Self-pay | Admitting: *Deleted

## 2022-01-01 ENCOUNTER — Encounter (HOSPITAL_COMMUNITY): Payer: Self-pay

## 2022-01-01 MED ORDER — IVABRADINE HCL 7.5 MG PO TABS: ORAL_TABLET | ORAL | 0 refills | Status: DC

## 2022-01-01 NOTE — Telephone Encounter (Signed)
Reaching out to patient to offer assistance regarding upcoming cardiac imaging study; pt verbalizes understanding of appt date/time, parking situation and where to check in,  medications ordered, and verified current allergies; name and call back number provided for further questions should they arise  Gordy Clement RN Navigator Cardiac Imaging [facility] [phone] office [phone] cell  Confirmed patient's HR and BP and per protocol, patient to take 15mg  ivabradine two hours prior to her cardiac CT scan. She is aware to arrive at 10:30am. Mychart instructions sent.

## 2022-01-04 ENCOUNTER — Ambulatory Visit (INDEPENDENT_AMBULATORY_CARE_PROVIDER_SITE_OTHER)

## 2022-01-04 VITALS — BP 106/62 | HR 79 | Temp 98.0°F | Wt 130.4 lb

## 2022-01-04 DIAGNOSIS — I509 Heart failure, unspecified: Secondary | ICD-10-CM

## 2022-01-04 DIAGNOSIS — D75839 Thrombocytosis, unspecified: Secondary | ICD-10-CM | POA: Diagnosis not present

## 2022-01-04 DIAGNOSIS — R198 Other specified symptoms and signs involving the digestive system and abdomen: Secondary | ICD-10-CM

## 2022-01-04 DIAGNOSIS — E876 Hypokalemia: Secondary | ICD-10-CM

## 2022-01-04 DIAGNOSIS — I5032 Chronic diastolic (congestive) heart failure: Secondary | ICD-10-CM | POA: Insufficient documentation

## 2022-01-04 DIAGNOSIS — F17210 Nicotine dependence, cigarettes, uncomplicated: Secondary | ICD-10-CM

## 2022-01-04 MED ORDER — ZOLPIDEM TARTRATE 5 MG PO TABS: 5.0000 mg | ORAL_TABLET | Freq: Every evening | ORAL | 0 refills | Status: DC | PRN

## 2022-01-04 MED ORDER — METHOCARBAMOL 500 MG PO TABS: 500.0000 mg | ORAL_TABLET | Freq: Three times a day (TID) | ORAL | 1 refills | Status: DC | PRN

## 2022-01-04 NOTE — Patient Instructions (Addendum)
Thank you for coming to see [place] in clinic Kimberly Terrell.   Plan: - We will refill your methocarbamol for pain - We will refill your zolpidem to help you sleep - We will do lab work today to check your blood cells and electrolytes - Please continue to follow up with general surgery and cardiology  It was very nice to meet you.

## 2022-01-04 NOTE — Assessment & Plan Note (Addendum)
Noted during recent hospitalization. Suspected to be stress-induced from perforated gastric ulcer. Most recent echo demonstrated EF 55-60% with improved LV function. Kimberly Terrell was stopped and discharged on home metoprolol. No tachycardia today. Patient has f/u appointment with cardiology tomorrow.  Plan: - Recommend f/u with cardiology

## 2022-01-04 NOTE — Progress Notes (Signed)
CC: Hospital f/u  HPI:  [name] is a 46 y.o. female with past medical history of GERD, perforated viscus, chronic diarrhea, asthma, migraine, depression, GAD, and chronic pain that presents for a hospital f/u visit.   Patient was discharged on 9/19 following repair of perforated gastric ulcer complicated by small intra-abdominal abscesses. Patient was discharged on protonix, Augmentin for 5 days, tylenol, oxycodone, methocarbamol, and 7 day course of zolpidem for sleep. Patient followed up with general surgery on 10/6 and had steri strips placed on her abdominal pain. She states that these steri strips were causing itching and irritation and therefore took them off. Patient has been updating her general surgery office about this and they are comfortable with her continuing to care for her wound as instructed. Patient states that her abdominal pain has slowly improved since her hospitalization. Patient has been having bowel movements regularly. She continues to pass flatulence. She is eating well at home and gaining weight. Patient denies fever, chills, nausea, or vomiting. Patient continues to use tylenol and methocarbamol for pain control. She continues to have difficulty sleeping due to need to sleep on her side from recent surgery. She states that the zolpidem she was prescribed helps a lot with her sleep. Patient states that she is in the process of securing a new PCP which should hopefully be within the next month.   Patient developed acute heart failure with mildly reduced ejection fraction during her recent hospitalization. Suspected to be stress-induced from perforated gastric ulcer. Most recent echo demonstrated EF 55-60% with improved LV function. Sherryll Burger was stopped and discharged on home metoprolol. Patient has f/u appointment with cardiology tomorrow.   Allergies as of 01/04/2022       Reactions   Imitrex [sumatriptan] Other (See Comments)   "Gave me lockjaw"   Tape Other  (See Comments)   The "plastic-like tape causes redness and irritation"   Aspirin Other (See Comments)   Caused chest pain        Medication List        Accurate as of January 04, 2022  6:31 AM. If you have any questions, ask your nurse or doctor.          acetaminophen 500 MG tablet Commonly known as: TYLENOL Take 2 tablets (1,000 mg total) by mouth every 6 (six) hours as needed.   albuterol 108 (90 Base) MCG/ACT inhaler Commonly known as: VENTOLIN HFA Inhale 2 puffs into the lungs every 6 (six) hours as needed for wheezing or shortness of breath.   amoxicillin-clavulanate 500-125 MG tablet Commonly known as: Augmentin Take 1 tablet (500 mg total) by mouth 3 (three) times daily.   diclofenac sodium 1 % Gel Commonly known as: VOLTAREN Apply 2 g topically 4 (four) times daily. What changed:  when to take this reasons to take this   escitalopram 10 MG tablet Commonly known as: LEXAPRO Take 10 mg by mouth daily.   GLUCOSAMINE CHOND CMP ADVANCED PO Take 2 capsules by mouth daily.   ivabradine 7.5 MG Tabs tablet Commonly known as: CORLANOR Take tablets (15mg ) TWO hours prior to your cardiac CT scan.   Melatonin 10 MG Caps Take 20 mg by mouth at bedtime.   methocarbamol 500 MG tablet Commonly known as: ROBAXIN Take 1 tablet (500 mg total) by mouth every 8 (eight) hours as needed for muscle spasms.   metoprolol succinate 25 MG 24 hr tablet Commonly known as: TOPROL-XL Take 1 tablet (25 mg total) by mouth daily. Please  make overdue appt with Dr. Rayann Heman before anymore refills. Thank you 2nd attempt   Mucinex Cold & Flu 325-200 MG Caps Generic drug: Acetaminophen-guaiFENesin Take 2 tablets by mouth daily as needed (cold symptoms).   Oxcarbazepine 300 MG tablet Commonly known as: TRILEPTAL TAKE 1 TABLET BY MOUTH AT BEDTIME What changed:  how much to take how to take this when to take this additional instructions   oxyCODONE 5 MG immediate release  tablet Commonly known as: Oxy IR/ROXICODONE Take 1-2 tablets (5-10 mg total) by mouth every 4 (four) hours as needed for moderate pain.   pantoprazole 40 MG tablet Commonly known as: PROTONIX Take 1 tablet (40 mg total) by mouth daily.   traZODone 50 MG tablet Commonly known as: DESYREL Take 50 mg by mouth at bedtime.   zolpidem 5 MG tablet Commonly known as: AMBIEN Take 1 tablet (5 mg total) by mouth at bedtime as needed for up to 7 days for sleep.         Past Medical History:  Diagnosis Date   Anxiety    Arthritis    Asthma    Bursitis    Right Hip and Right Knee   Depression    H. pylori infection    s/p triple therapy x 2   Migraines    Review of Systems:  per HPI.   Physical Exam: Vitals:   01/04/22 0923  BP: 106/62  Pulse: 79  Temp: 98 F (36.7 C)  TempSrc: Oral  SpO2: 98%  Weight: 130 lb 6.4 oz (59.1 kg)    Constitutional: Well-developed, well-nourished, appears comfortable  Cardiovascular: Regular rate, regular rhythm. No murmurs, rubs, or gallops. Normal radial and PT pulses bilaterally. No LE edema.  Pulmonary: Normal respiratory effort. No rales or rhonchi. Mild wheeze in lung bases bilaterally.  Abdominal: Soft. Non-distended. Normal bowel sounds.  Minimal tenderness to palpation of all four quadrants, healing midline abdominal surgical wound with minimal yellow drainage and without surrounding erythema or swelling.  Musculoskeletal: Normal range of motion.     Neurological: Alert and oriented to person, place, and time. Non-focal. Skin: warm and dry.    Assessment & Plan:   See Encounters Tab for problem based charting.  Patient seen with Dr. Evette Doffing

## 2022-01-04 NOTE — Progress Notes (Signed)
Internal Medicine Clinic Attending  I saw and evaluated the patient.  I personally confirmed the key portions of the history and exam documented by Dr. Mapp and I reviewed pertinent patient test results.  The assessment, diagnosis, and plan were formulated together and I agree with the documentation in the resident's note.  

## 2022-01-04 NOTE — Assessment & Plan Note (Signed)
Patient was discharged on 9/19 following repair of perforated gastric ulcer complicated by small intra-abdominal abscesses. Patient was discharged on protonix, Augmentin for 5 days, tylenol, oxycodone, methocarbamol, and 7 day course of zolpidem for sleep. Patient followed up with general surgery on 10/6 and had steri strips placed on her abdominal pain. She states that these steri strips were causing itching and irritation and therefore took them off. Patient has been updating her general surgery office about this and they are comfortable with her continuing to care for her wound as instructed. Patient states that her abdominal pain has slowly improved since her hospitalization. Patient has been having bowel movements regularly. She continues to pass flatulence. She is eating well at home and gaining weight. Patient denies fever, chills, nausea, or vomiting. Patient continues to use tylenol and methocarbamol for pain control. She continues to have difficulty sleeping due to need to sleep on her side from recent surgery. She states that the zolpidem she was prescribed helps a lot with her sleep. Patient states that she is in the process of securing a new PCP which should hopefully be within the next month.   Plan: - Refill Zolpidem for sleep - Refill Methocarbamol for pain - Instructed patient to not take zolpidem or methocarbamol at the same time - Recommend continuing to f/u w/ general surgery

## 2022-01-04 NOTE — Assessment & Plan Note (Signed)
Noted during recent hospitalization. Ferritin and TIBC were normal but iron was low. Blood smear from prior hospitalization demonstrates marked thrombocytosis. Could be secondary to acute infection vs iron deficiency vs chronic inflammatory condition.  Plan: - Repeat CBC today

## 2022-01-05 ENCOUNTER — Ambulatory Visit (HOSPITAL_COMMUNITY)
Admission: RE | Admit: 2022-01-05 | Discharge: 2022-01-05 | Disposition: A | Discharge status: Home / Self Care | Source: Ambulatory Visit | Attending: Cardiovascular Disease | Admitting: Cardiovascular Disease

## 2022-01-05 DIAGNOSIS — R079 Chest pain, unspecified: Secondary | ICD-10-CM | POA: Diagnosis not present

## 2022-01-05 LAB — CBC WITH DIFFERENTIAL/PLATELET
Basophils Absolute: 0.1 10*3/uL (ref 0.0–0.2)
Basos: 1 %
EOS (ABSOLUTE): 0.5 10*3/uL — ABNORMAL HIGH (ref 0.0–0.4)
Eos: 5 %
Hematocrit: 34 % (ref 34.0–46.6)
Hemoglobin: 10.3 g/dL — ABNORMAL LOW (ref 11.1–15.9)
Immature Grans (Abs): 0 10*3/uL (ref 0.0–0.1)
Immature Granulocytes: 0 %
Lymphocytes Absolute: 2.1 10*3/uL (ref 0.7–3.1)
Lymphs: 24 %
MCH: 24.2 pg — ABNORMAL LOW (ref 26.6–33.0)
MCHC: 30.3 g/dL — ABNORMAL LOW (ref 31.5–35.7)
MCV: 80 fL (ref 79–97)
Monocytes Absolute: 0.5 10*3/uL (ref 0.1–0.9)
Monocytes: 6 %
Neutrophils Absolute: 5.3 10*3/uL (ref 1.4–7.0)
Neutrophils: 64 %
Platelets: 341 10*3/uL (ref 150–450)
RBC: 4.26 x10E6/uL (ref 3.77–5.28)
RDW: 13.9 % (ref 11.7–15.4)
WBC: 8.4 10*3/uL (ref 3.4–10.8)

## 2022-01-05 LAB — BMP8+ANION GAP
Anion Gap: 14 mmol/L (ref 10.0–18.0)
BUN/Creatinine Ratio: 11 (ref 9–23)
BUN: 6 mg/dL (ref 6–24)
CO2: 22 mmol/L (ref 20–29)
Calcium: 9.6 mg/dL (ref 8.7–10.2)
Chloride: 102 mmol/L (ref 96–106)
Creatinine, Ser: 0.55 mg/dL — ABNORMAL LOW (ref 0.57–1.00)
Glucose: 91 mg/dL (ref 70–99)
Potassium: 4.8 mmol/L (ref 3.5–5.2)
Sodium: 138 mmol/L (ref 134–144)
eGFR: 115 mL/min/{1.73_m2} (ref >59)

## 2022-01-05 MED ORDER — NITROGLYCERIN 0.4 MG SL SUBL
SUBLINGUAL_TABLET | SUBLINGUAL | Status: AC
  Administered 2022-01-05: 0.8 mg via SUBLINGUAL
  Filled 2022-01-05: qty 2

## 2022-01-05 MED ORDER — METOPROLOL TARTRATE 5 MG/5ML IV SOLN: 5.0000 mg | INTRAVENOUS | Status: DC | PRN

## 2022-01-05 MED ORDER — IOHEXOL 350 MG/ML SOLN
100.0000 mL | Freq: Once | INTRAVENOUS | Status: AC | PRN
  Administered 2022-01-05: 100 mL via INTRAVENOUS

## 2022-01-05 MED ORDER — NITROGLYCERIN 0.4 MG SL SUBL: 0.8000 mg | SUBLINGUAL_TABLET | Freq: Once | SUBLINGUAL | Status: AC

## 2022-01-05 NOTE — Progress Notes (Signed)
Called and updated patient on normal results.

## 2022-01-05 NOTE — Progress Notes (Signed)
Called and updated patient that her thrombocytosis had resolved. Discussed her mild normocytic anemia. Discussed that her hemoglobin is trending upward and that we will continue to monitor at our next appointment.

## 2022-01-06 ENCOUNTER — Other Ambulatory Visit: Payer: Self-pay

## 2022-01-06 MED ORDER — METOPROLOL SUCCINATE ER 25 MG PO TB24: 25.0000 mg | ORAL_TABLET | Freq: Every day | ORAL | 0 refills | Status: DC

## 2022-01-06 NOTE — Progress Notes (Signed)
No referral that I have been made aware of, but my team will work to see if she can be seen at [facility] (based on her insurance) and if so then we will work her in to consider potential timing of an endoscopic evaluation in the setting of her recent perforated gastric ulcer.

## 2022-01-08 ENCOUNTER — Telehealth: Payer: Self-pay | Admitting: Gastroenterology

## 2022-01-08 NOTE — Telephone Encounter (Signed)
Good Afternoon Dr. Silvestre Moment,   Patients husband called stating that patient needed to be schedule for an office visit. He stated that he would like patient to be seen by you. Patient was seen in the hospital on 9/7 for a Gastric Perforation. Patient has Gastroenterology history with [facility] from 2021. Will you please review patients records in Epic and advise on scheduling patient?  Thank you

## 2022-01-09 NOTE — Telephone Encounter (Signed)
As long as this patient has insurance to be able to see [place], she can be scheduled for a clinic visit with me or with one of the APPs and I will supervise/staff.  Thank you. GM

## 2022-01-11 ENCOUNTER — Ambulatory Visit: Admitting: Podiatry

## 2022-01-13 ENCOUNTER — Ambulatory Visit: Admitting: Physician Assistant

## 2022-01-15 ENCOUNTER — Ambulatory Visit: Admitting: Physician Assistant

## 2022-01-15 NOTE — Telephone Encounter (Signed)
LVM for husband stating for either he or the patient to call back to schedule OV.

## 2022-01-25 NOTE — Progress Notes (Unsigned)
Cardiology Office Note:    Date:  01/27/2022   ID:  Kimberly Terrell, Romo 1976/01/08, MRN [id number]  PCP:  [place] [place] Prov[facility] Cardiologist:  Jenkins Rouge, MD     Referring MD: Ledell Noss, [facility]   Chief Complaint  Patient presents with   Follow-up    SOB, HFrEF    History of Present Illness:    Kimberly Terrell. Broussard is a 46 y.o. female with a hx of anxiety, depression, sinus tachcyardia and H pylori x 2. She was recently hospitalized for peritonitis with perforated peptic ulcer requiring ex lap 12/04/21 - she had a perforated distal body posterior wall stomach ulcer with 1.5 L gastric contents in abdominal cavity and peritonitis. Cardiology consulted for sinus tachcyadia and elevated troponin. Initial echo showed LVEF 40-45% with distal septal, apical, and inferior basal hypokinesis. High suspicion for stress-induced cardiomyopathy. Repeat limited echo 12/14/21 showed LVEF 55-60% with no RWMA. She underwent outpatient CT coronary which showed a coronary calcium score of zero and no evidence of CAD. She is not on ASA due to no CAD and hx of PUD. Elevated troponin felt demand ischemia. ASCVD < 5%, statin was not added for LDL 82. GDMT titrated to include low dose entresto and toprol. [name] was not continued outpatient given recovery of EF.   She presents today for hospital follow up. She is here with her husband Kimberly Terrell. She reports shortness breath constantly - at rest and with exertion. SOB started 2-3 weeks ago and has worsened over the last 2 days. She reports feeling something sitting on her chest. She reports orthopnea, no PND. She also reports whole body itching with hives. They also went on a cruise 19-21st. She does report eating a lot of salt. She appears comfortable on exam.    Past Medical History:  Diagnosis Date   Anxiety    Arthritis    Asthma    Bursitis    Right Hip and Right Knee   Depression    H. pylori infection    s/p  triple therapy x 2   Migraines     Past Surgical History:  Procedure Laterality Date   COSMETIC SURGERY  2008   breast implants    LAPAROTOMY N/A 12/04/2021   Procedure: EXPLORATORY LAPAROTOMY;  Surgeon: Erroll Luna, MD;  Location: [place] [facility];  Service: General;  Laterality: N/A;    Current Medications: Current Meds  Medication Sig   acetaminophen (TYLENOL) 500 MG tablet Take 2 tablets (1,000 mg total) by mouth every 6 (six) hours as needed.   Acetaminophen-guaiFENesin (MUCINEX COLD & FLU) 325-200 MG CAPS Take 2 tablets by mouth daily as needed (cold symptoms).   albuterol (VENTOLIN HFA) 108 (90 Base) MCG/ACT inhaler Inhale 2 puffs into the lungs every 6 (six) hours as needed for wheezing or shortness of breath.   diclofenac sodium (VOLTAREN) 1 % GEL Apply 2 g topically 4 (four) times daily. (Patient taking differently: Apply 2 g topically 4 (four) times daily as needed (pain).)   escitalopram (LEXAPRO) 10 MG tablet Take 10 mg by mouth daily.   furosemide (LASIX) 40 MG tablet Take 1 tablet (40 mg total) by mouth daily.   Melatonin 10 MG CAPS Take 20 mg by mouth at bedtime.   methocarbamol (ROBAXIN) 500 MG tablet Take 1 tablet (500 mg total) by mouth every 8 (eight) hours as needed for muscle spasms.   metoprolol succinate (TOPROL-XL) 25 MG 24 hr tablet Take 1 tablet (25  mg total) by mouth daily.   Oxcarbazepine (TRILEPTAL) 300 MG tablet TAKE 1 TABLET BY MOUTH AT BEDTIME   pantoprazole (PROTONIX) 40 MG tablet Take 1 tablet (40 mg total) by mouth daily.   potassium chloride (KLOR-CON) 10 MEQ tablet Take 1 tablet (10 mEq total) by mouth daily.   traZODone (DESYREL) 50 MG tablet Take 50 mg by mouth at bedtime.   zolpidem (AMBIEN) 5 MG tablet Take 1 tablet (5 mg total) by mouth at bedtime as needed for up to 28 days for sleep.     Allergies:   Imitrex [sumatriptan], Tape, and Aspirin   Social History   Socioeconomic History   Marital status: Married    Spouse name: Queen Vlad   Number  of children: 1   Years of education: Not on file   Highest education level: Not on file  Occupational History   Occupation: [employer]  Tobacco Use   Smoking status: Every Day    Packs/day: 1.00    Types: Cigarettes   Smokeless tobacco: Never   Tobacco comments:    Planning to quit 03/30/2019  Vaping Use   Vaping Use: Never used  Substance and Sexual Activity   Alcohol use: No   Drug use: No   Sexual activity: Yes    Birth control/protection: I.U.D., Surgical    Comment: husband has had a vasectomy  Other Topics Concern   Not on file  Social History Narrative   Right handed    Lives with [place] Yohannes   Caffeine use: coffee-daily   Social Determinants of Health   Financial Resource Strain: Low Risk  (07/10/2019)   Overall Financial Resource Strain (CARDIA)    Difficulty of Paying Living Expenses: Not hard at all  Food Insecurity: No Food Insecurity (12/11/2021)   Hunger Vital Sign    Worried About Running Out of Food in the Last Year: Never true    Ran Out of Food in the Last Year: Never true  Transportation Needs: No Transportation Needs (12/11/2021)   PRAPARE - Hydrologist (Medical): No    Lack of Transportation (Non-Medical): No  Physical Activity: Sufficiently Active (07/10/2019)   Exercise Vital Sign    Days of Exercise per Week: 3 days    Minutes of Exercise per Session: 60 min  Stress: No Stress Concern Present (07/10/2019)   [place]    Feeling of Stress : Not at all  Social Connections: Moderately Isolated (07/10/2019)   Social Connection and Isolation Panel [NHANES]    Frequency of Communication with Friends and Family: Once a week    Frequency of Social Gatherings with Friends and Family: Once a week    Attends Religious Services: 1 to 4 times per year    Active Member of [employer] or Organizations: No    Attends Archivist Meetings: Never    Marital  Status: Married     Family History: The patient's family history includes Depression in her daughter and mother. There is no history of Colon cancer, Esophageal cancer, or Gastric cancer.  ROS:   Please see the history of present illness.     All other systems reviewed and are negative.  EKGs/Labs/Other Studies Reviewed:    The following studies were reviewed today:   CT coronary 2023: IMPRESSION: 1.  Calcium score 0   2.  Normal right dominant coronary arteries   3.  Normal ascending thoracic aorta 2.8 cm  Complete Echocardiogram 12/04/2021: Impressions:  1. Distal septal/ apical/ inferior basal hypokinesis No obvious mural  thrombus but would consider repeating TTE after emergent surgery for  perforated gastric ulcer when HR lower and images better . Left  ventricular ejection fraction, by estimation, is  40 to 45%. The left ventricle has mildly decreased function. The left  ventricle has no regional wall motion abnormalities. Left ventricular  diastolic parameters are indeterminate.   2. Right ventricular systolic function is mildly reduced. The right  ventricular size is mildly enlarged. There is normal pulmonary artery  systolic pressure.   3. The mitral valve is normal in structure. No evidence of mitral valve  regurgitation. No evidence of mitral stenosis.   4. The aortic valve is tricuspid. Aortic valve regurgitation is not  visualized. No aortic stenosis is present.   5. The inferior vena cava is normal in size with greater than 50%  respiratory variability, suggesting right atrial pressure of 3 mmHg.  _______________   Limited Echocardiogram 12/14/2021: Impressions:  1. Left ventricular ejection fraction, by estimation, is 55 to 60%. The  left ventricle has normal function. The left ventricle has no regional  wall motion abnormalities   2. Side by Side images were compared from prior study 12/04/21 showing  improved LVF.    EKG:  EKG is not ordered today.    Recent Labs: 12/05/2021: ALT 26 12/15/2021: Magnesium 1.9 01/04/2022: BUN 6; Creatinine, Ser 0.55; Hemoglobin 10.3; Platelets 341; Potassium 4.8; Sodium 138  Recent Lipid Panel    Component Value Date/Time   CHOL 133 12/12/2021 0233   CHOL 184 05/04/2019 1058   TRIG 171 (H) 12/12/2021 0233   HDL 17 (L) 12/12/2021 0233   HDL 49 05/04/2019 1058   CHOLHDL 7.8 12/12/2021 0233   VLDL 34 12/12/2021 0233   LDLCALC 82 12/12/2021 0233   LDLCALC 120 (H) 05/04/2019 1058     Risk Assessment/Calculations:                Physical Exam:    VS:  BP (!) 100/54   Pulse 66   Ht 5\' 3"  (1.6 m)   Wt 135 lb 9.6 oz (61.5 kg)   SpO2 98%   BMI 24.02 kg/m     Wt Readings from Last 3 Encounters:  01/27/22 135 lb 9.6 oz (61.5 kg)  01/04/22 130 lb 6.4 oz (59.1 kg)  12/06/21 146 lb 8 oz (66.5 kg)     GEN:  Well nourished, well developed in no acute distress HEENT: Normal NECK: No JVD; No carotid bruits LYMPHATICS: No lymphadenopathy CARDIAC: RRR, no murmurs, rubs, gallops RESPIRATORY:  Clear to auscultation without rales, wheezing or rhonchi  ABDOMEN: Soft, non-tender, non-distended MUSCULOSKELETAL:  No edema; No deformity  SKIN: Warm and dry NEUROLOGIC:  Alert and oriented x 3 PSYCHIATRIC:  Normal affect   ASSESSMENT:    1. Dyspnea, unspecified type   2. Orthopnea   3. Acute systolic heart failure ([place])   4. Stress-induced cardiomyopathy   5. Sinus tachycardia   6. NSVT (nonsustained ventricular tachycardia) (HCC)   7. Moderate mixed hyperlipidemia not requiring statin therapy    PLAN:    In order of problems listed above:  Dyspnea at rest Orthopnea Will draw BNP, BMP, and CBC today (anemic in the hospital) Will obtain echo Start 40 mg lasix daily + 10 mEq potassium x 5 days while holding toprol She appears comfortable on exam and does not appear volume up. Given her body habitus, I do not think  she will tolerate even small amounts of fluid. I also asked her to take toprol  at night when she resumes. Gave strict ER precautions.  For long-term medical therapy, consider SGLT2i given marginal pressure.  I suspect dietary indiscretion with recent cruise may be contributory.    Presumed stress-induced cardiomyopathy No evidence of CAD on coronary CTA On 24-26 mg entresto BID, 25 mg toprol in hospital, entresto was not continued OP Did not add SGLT2i given her quick recovery of EF   NSVT Sinus tachycardia Baseline HR in the 90s with occasional PVCs Continue BB   Hyperlipidemia 12/12/2021: Cholesterol 133; HDL 17; LDL Cholesterol 82; Triglycerides 171; VLDL 34 CTA coronary without coronary calcifications Will hold off on statin   Follow up in 2 weeks.       Medication Adjustments/Labs and Tests Ordered: Current medicines are reviewed at length with the patient today.  Concerns regarding medicines are outlined above.  Orders Placed This Encounter  Procedures   Brain natriuretic peptide   Basic metabolic panel   CBC   ECHOCARDIOGRAM COMPLETE   Meds ordered this encounter  Medications   furosemide (LASIX) 40 MG tablet    Sig: Take 1 tablet (40 mg total) by mouth daily.    Dispense:  30 tablet    Refill:  0   potassium chloride (KLOR-CON) 10 MEQ tablet    Sig: Take 1 tablet (10 mEq total) by mouth daily.    Dispense:  30 tablet    Refill:  0    Patient Instructions  Medication Instructions:  START Lasix 40 mg daily for 5 days. STARTING today  START Potassium 10 meq daily for 4 days STARTING 01/28/22 and the remaining days of taking Lasix  TAKE Metoprolol Succinate (Toprol-XL) 25 mg at night. HOLD while taking Lasix   *If you need a refill on your cardiac medications before your next appointment, please call your pharmacy*  Lab Work: Your physician recommends that you return for lab work TODAY:  BMP BNP CBC  If you have labs (blood work) drawn today and your tests are completely normal, you will receive your results only by: MyChart  Message (if you have MyChart) OR A paper copy in the mail If you have any lab test that is abnormal or we need to change your treatment, we will call you to review the results.  Testing/Procedures: Fabian Sharp, PA-C has requested that you have an echocardiogram. Echocardiography is a painless test that uses sound waves to create images of your heart. It provides your doctor with information about the size and shape of your heart and how well your heart's chambers and valves are working. This procedure takes approximately one hour. There are no restrictions for this procedure. Please do NOT wear cologne, perfume, aftershave, or lotions (deodorant is allowed). Please arrive 15 minutes prior to your appointment time.  Follow-Up: At [facility], you and your health needs are our priority.  As part of our continuing mission to provide you with exceptional heart care, we have created designated Provider Care Teams.  These Care Teams include your primary Cardiologist (physician) and Advanced Practice Providers (APPs -  Physician Assistants and Nurse Practitioners) who all work together to provide you with the care you need, when you need it.   Your next appointment:   2 week(s)  The format for your next appointment:   In Person  Provider:   Jenkins Rouge, MD  or  APP  Other Instructions PLEASE go to the nearest Emergency Department if shortness of breath worsen For your daily salt (sodium) intake should be 1800 mg or less a day        Low-Sodium Eating Plan  Sodium, which is an element that makes up salt, helps you maintain a healthy balance of fluids in your body. Too much sodium can increase your blood pressure and cause fluid and waste to be held in your body. Your health care provider or dietitian may recommend following this plan if you have high blood pressure (hypertension), kidney disease, liver disease, or heart failure. Eating less sodium can help lower your blood  pressure, reduce swelling, and protect your heart, liver, and kidneys. What are tips for following this plan? Reading food labels The Nutrition Facts label lists the amount of sodium in one serving of the food. If you eat more than one serving, you must multiply the listed amount of sodium by the number of servings. Choose foods with less than 140 mg of sodium per serving. Avoid foods with 300 mg of sodium or more per serving. Shopping  Look for lower-sodium products, often labeled as "low-sodium" or "no salt added." Always check the sodium content, even if foods are labeled as "unsalted" or "no salt added." Buy fresh foods. Avoid canned foods and pre-made or frozen meals. Avoid canned, cured, or processed meats. Buy breads that have less than 80 mg of sodium per slice. Cooking  Eat more home-cooked food and less restaurant, buffet, and fast food. Avoid adding salt when cooking. Use salt-free seasonings or herbs instead of table salt or sea salt. Check with your health care provider or pharmacist before using salt substitutes. Cook with plant-based oils, such as canola, sunflower, or olive oil. Meal planning When eating at a restaurant, ask that your food be prepared with less salt or no salt, if possible. Avoid dishes labeled as brined, pickled, cured, smoked, or made with soy sauce, miso, or teriyaki sauce. Avoid foods that contain MSG (monosodium glutamate). MSG is sometimes added to [place] food, bouillon, and some canned foods. Make meals that can be grilled, baked, poached, roasted, or steamed. These are generally made with less sodium. General information Most people on this plan should limit their sodium intake to 1,500-2,000 mg (milligrams) of sodium each day. What foods should I eat? Fruits Fresh, frozen, or canned fruit. Fruit juice. Vegetables Fresh or frozen vegetables. "No salt added" canned vegetables. "No salt added" tomato sauce and paste. Low-sodium or reduced-sodium  tomato and vegetable juice. Grains Low-sodium cereals, including oats, puffed wheat and rice, and shredded wheat. Low-sodium crackers. Unsalted rice. Unsalted pasta. Low-sodium bread. Whole-grain breads and whole-grain pasta. Meats and other proteins Fresh or frozen (no salt added) meat, poultry, seafood, and fish. Low-sodium canned tuna and salmon. Unsalted nuts. Dried peas, beans, and lentils without added salt. Unsalted canned beans. Eggs. Unsalted nut butters. Dairy Milk. Soy milk. Cheese that is naturally low in sodium, such as ricotta cheese, fresh mozzarella, or Swiss cheese. Low-sodium or reduced-sodium cheese. Cream cheese. Yogurt. Seasonings and condiments Fresh and dried herbs and spices. Salt-free seasonings. Low-sodium mustard and ketchup. Sodium-free salad dressing. Sodium-free light mayonnaise. Fresh or refrigerated horseradish. Lemon juice. Vinegar. Other foods Homemade, reduced-sodium, or low-sodium soups. Unsalted popcorn and pretzels. Low-salt or salt-free chips. The items listed above may not be a complete list of foods and beverages you can eat. Contact a dietitian for more information. What foods should I avoid? Vegetables Sauerkraut, pickled vegetables, and relishes.  Olives. [place] fries. Onion rings. Regular canned vegetables (not low-sodium or reduced-sodium). Regular canned tomato sauce and paste (not low-sodium or reduced-sodium). Regular tomato and vegetable juice (not low-sodium or reduced-sodium). Frozen vegetables in sauces. Grains Instant hot cereals. Bread stuffing, pancake, and biscuit mixes. Croutons. Seasoned rice or pasta mixes. Noodle soup cups. Boxed or frozen macaroni and cheese. Regular salted crackers. Self-rising flour. Meats and other proteins Meat or fish that is salted, canned, smoked, spiced, or pickled. Precooked or cured meat, such as sausages or meat loaves. Berniece Salines. Ham. Pepperoni. Hot dogs. Corned beef. Chipped beef. Salt pork. Jerky. Pickled  herring. Anchovies and sardines. Regular canned tuna. Salted nuts. Dairy Processed cheese and cheese spreads. Hard cheeses. Cheese curds. Blue cheese. Feta cheese. String cheese. Regular cottage cheese. Buttermilk. Canned milk. Fats and oils Salted butter. Regular margarine. Ghee. Bacon fat. Seasonings and condiments Onion salt, garlic salt, seasoned salt, table salt, and sea salt. Canned and packaged gravies. Worcestershire sauce. Tartar sauce. Barbecue sauce. Teriyaki sauce. Soy sauce, including reduced-sodium. Steak sauce. Fish sauce. Oyster sauce. Cocktail sauce. Horseradish that you find on the shelf. Regular ketchup and mustard. Meat flavorings and tenderizers. Bouillon cubes. Hot sauce. Pre-made or packaged marinades. Pre-made or packaged taco seasonings. Relishes. Regular salad dressings. Salsa. Other foods Salted popcorn and pretzels. Corn chips and puffs. Potato and tortilla chips. Canned or dried soups. Pizza. Frozen entrees and pot pies. The items listed above may not be a complete list of foods and beverages you should avoid. Contact a dietitian for more information. Summary Eating less sodium can help lower your blood pressure, reduce swelling, and protect your heart, liver, and kidneys. Most people on this plan should limit their sodium intake to 1,500-2,000 mg (milligrams) of sodium each day. Canned, boxed, and frozen foods are high in sodium. Restaurant foods, fast foods, and pizza are also very high in sodium. You also get sodium by adding salt to food. Try to cook at home, eat more fresh fruits and vegetables, and eat less fast food and canned, processed, or prepared foods. This information is not intended to replace advice given to you by your health care provider. Make sure you discuss any questions you have with your health care provider. Document Revised: 04/20/2019 Document Reviewed: 02/14/2019 Elsevier Patient Education  [place], [place]  01/27/2022 11:55 AM    [place]

## 2022-01-27 ENCOUNTER — Encounter: Payer: Self-pay | Admitting: Physician Assistant

## 2022-01-27 ENCOUNTER — Ambulatory Visit: Attending: Physician Assistant | Admitting: Physician Assistant

## 2022-01-27 VITALS — BP 100/54 | HR 66 | Ht 63.0 in | Wt 135.6 lb

## 2022-01-27 DIAGNOSIS — R0600 Dyspnea, unspecified: Secondary | ICD-10-CM

## 2022-01-27 DIAGNOSIS — I5021 Acute systolic (congestive) heart failure: Secondary | ICD-10-CM | POA: Diagnosis not present

## 2022-01-27 DIAGNOSIS — I5181 Takotsubo syndrome: Secondary | ICD-10-CM

## 2022-01-27 DIAGNOSIS — R0601 Orthopnea: Secondary | ICD-10-CM

## 2022-01-27 DIAGNOSIS — R000 Tachycardia, unspecified: Secondary | ICD-10-CM

## 2022-01-27 DIAGNOSIS — E782 Mixed hyperlipidemia: Secondary | ICD-10-CM

## 2022-01-27 DIAGNOSIS — I4729 Other ventricular tachycardia: Secondary | ICD-10-CM

## 2022-01-27 MED ORDER — POTASSIUM CHLORIDE ER 10 MEQ PO TBCR: 10.0000 meq | EXTENDED_RELEASE_TABLET | Freq: Every day | ORAL | 0 refills | Status: DC

## 2022-01-27 MED ORDER — FUROSEMIDE 40 MG PO TABS: 40.0000 mg | ORAL_TABLET | Freq: Every day | ORAL | 0 refills | Status: DC

## 2022-01-27 NOTE — Patient Instructions (Addendum)
Medication Instructions:  START Lasix 40 mg daily for 5 days. STARTING today  START Potassium 10 meq daily for 4 days STARTING 01/28/22 and the remaining days of taking Lasix  TAKE Metoprolol Succinate (Toprol-XL) 25 mg at night. HOLD while taking Lasix   *If you need a refill on your cardiac medications before your next appointment, please call your pharmacy*  Lab Work: Your physician recommends that you return for lab work TODAY:  BMP BNP CBC  If you have labs (blood work) drawn today and your tests are completely normal, you will receive your results only by: MyChart Message (if you have MyChart) OR A paper copy in the mail If you have any lab test that is abnormal or we need to change your treatment, we will call you to review the results.  Testing/Procedures: Fabian Sharp, PA-C has requested that you have an echocardiogram. Echocardiography is a painless test that uses sound waves to create images of your heart. It provides your doctor with information about the size and shape of your heart and how well your heart's chambers and valves are working. This procedure takes approximately one hour. There are no restrictions for this procedure. Please do NOT wear cologne, perfume, aftershave, or lotions (deodorant is allowed). Please arrive 15 minutes prior to your appointment time.  Follow-Up: At [facility], you and your health needs are our priority.  As part of our continuing mission to provide you with exceptional heart care, we have created designated Provider Care Teams.  These Care Teams include your primary Cardiologist (physician) and Advanced Practice Providers (APPs -  Physician Assistants and Nurse Practitioners) who all work together to provide you with the care you need, when you need it.   Your next appointment:   2 week(s)  The format for your next appointment:   In Person  Provider:   Jenkins Rouge, MD  or  APP         Other Instructions PLEASE go to the  nearest Emergency Department if shortness of breath worsen For your daily salt (sodium) intake should be 1800 mg or less a day        Low-Sodium Eating Plan  Sodium, which is an element that makes up salt, helps you maintain a healthy balance of fluids in your body. Too much sodium can increase your blood pressure and cause fluid and waste to be held in your body. Your health care provider or dietitian may recommend following this plan if you have high blood pressure (hypertension), kidney disease, liver disease, or heart failure. Eating less sodium can help lower your blood pressure, reduce swelling, and protect your heart, liver, and kidneys. What are tips for following this plan? Reading food labels The Nutrition Facts label lists the amount of sodium in one serving of the food. If you eat more than one serving, you must multiply the listed amount of sodium by the number of servings. Choose foods with less than 140 mg of sodium per serving. Avoid foods with 300 mg of sodium or more per serving. Shopping  Look for lower-sodium products, often labeled as "low-sodium" or "no salt added." Always check the sodium content, even if foods are labeled as "unsalted" or "no salt added." Buy fresh foods. Avoid canned foods and pre-made or frozen meals. Avoid canned, cured, or processed meats. Buy breads that have less than 80 mg of sodium per slice. Cooking  Eat more home-cooked food and less restaurant, buffet, and fast food. Avoid adding salt when  cooking. Use salt-free seasonings or herbs instead of table salt or sea salt. Check with your health care provider or pharmacist before using salt substitutes. Cook with plant-based oils, such as canola, sunflower, or olive oil. Meal planning When eating at a restaurant, ask that your food be prepared with less salt or no salt, if possible. Avoid dishes labeled as brined, pickled, cured, smoked, or made with soy sauce, miso, or teriyaki  sauce. Avoid foods that contain MSG (monosodium glutamate). MSG is sometimes added to [place] food, bouillon, and some canned foods. Make meals that can be grilled, baked, poached, roasted, or steamed. These are generally made with less sodium. General information Most people on this plan should limit their sodium intake to 1,500-2,000 mg (milligrams) of sodium each day. What foods should I eat? Fruits Fresh, frozen, or canned fruit. Fruit juice. Vegetables Fresh or frozen vegetables. "No salt added" canned vegetables. "No salt added" tomato sauce and paste. Low-sodium or reduced-sodium tomato and vegetable juice. Grains Low-sodium cereals, including oats, puffed wheat and rice, and shredded wheat. Low-sodium crackers. Unsalted rice. Unsalted pasta. Low-sodium bread. Whole-grain breads and whole-grain pasta. Meats and other proteins Fresh or frozen (no salt added) meat, poultry, seafood, and fish. Low-sodium canned tuna and salmon. Unsalted nuts. Dried peas, beans, and lentils without added salt. Unsalted canned beans. Eggs. Unsalted nut butters. Dairy Milk. Soy milk. Cheese that is naturally low in sodium, such as ricotta cheese, fresh mozzarella, or Swiss cheese. Low-sodium or reduced-sodium cheese. Cream cheese. Yogurt. Seasonings and condiments Fresh and dried herbs and spices. Salt-free seasonings. Low-sodium mustard and ketchup. Sodium-free salad dressing. Sodium-free light mayonnaise. Fresh or refrigerated horseradish. Lemon juice. Vinegar. Other foods Homemade, reduced-sodium, or low-sodium soups. Unsalted popcorn and pretzels. Low-salt or salt-free chips. The items listed above may not be a complete list of foods and beverages you can eat. Contact a dietitian for more information. What foods should I avoid? Vegetables Sauerkraut, pickled vegetables, and relishes. Olives. [place] fries. Onion rings. Regular canned vegetables (not low-sodium or reduced-sodium). Regular canned tomato  sauce and paste (not low-sodium or reduced-sodium). Regular tomato and vegetable juice (not low-sodium or reduced-sodium). Frozen vegetables in sauces. Grains Instant hot cereals. Bread stuffing, pancake, and biscuit mixes. Croutons. Seasoned rice or pasta mixes. Noodle soup cups. Boxed or frozen macaroni and cheese. Regular salted crackers. Self-rising flour. Meats and other proteins Meat or fish that is salted, canned, smoked, spiced, or pickled. Precooked or cured meat, such as sausages or meat loaves. Berniece Salines. Ham. Pepperoni. Hot dogs. Corned beef. Chipped beef. Salt pork. Jerky. Pickled herring. Anchovies and sardines. Regular canned tuna. Salted nuts. Dairy Processed cheese and cheese spreads. Hard cheeses. Cheese curds. Blue cheese. Feta cheese. String cheese. Regular cottage cheese. Buttermilk. Canned milk. Fats and oils Salted butter. Regular margarine. Ghee. Bacon fat. Seasonings and condiments Onion salt, garlic salt, seasoned salt, table salt, and sea salt. Canned and packaged gravies. Worcestershire sauce. Tartar sauce. Barbecue sauce. Teriyaki sauce. Soy sauce, including reduced-sodium. Steak sauce. Fish sauce. Oyster sauce. Cocktail sauce. Horseradish that you find on the shelf. Regular ketchup and mustard. Meat flavorings and tenderizers. Bouillon cubes. Hot sauce. Pre-made or packaged marinades. Pre-made or packaged taco seasonings. Relishes. Regular salad dressings. Salsa. Other foods Salted popcorn and pretzels. Corn chips and puffs. Potato and tortilla chips. Canned or dried soups. Pizza. Frozen entrees and pot pies. The items listed above may not be a complete list of foods and beverages you should avoid. Contact a dietitian for more information. Summary Eating  less sodium can help lower your blood pressure, reduce swelling, and protect your heart, liver, and kidneys. Most people on this plan should limit their sodium intake to 1,500-2,000 mg (milligrams) of sodium each  day. Canned, boxed, and frozen foods are high in sodium. Restaurant foods, fast foods, and pizza are also very high in sodium. You also get sodium by adding salt to food. Try to cook at home, eat more fresh fruits and vegetables, and eat less fast food and canned, processed, or prepared foods. This information is not intended to replace advice given to you by your health care provider. Make sure you discuss any questions you have with your health care provider. Document Revised: 04/20/2019 Document Reviewed: 02/14/2019 Elsevier Patient Education  [place]

## 2022-01-28 ENCOUNTER — Encounter: Payer: Self-pay | Admitting: Gastroenterology

## 2022-01-28 LAB — BASIC METABOLIC PANEL
BUN/Creatinine Ratio: 12 (ref 9–23)
BUN: 7 mg/dL (ref 6–24)
CO2: 28 mmol/L (ref 20–29)
Calcium: 9.6 mg/dL (ref 8.7–10.2)
Chloride: 101 mmol/L (ref 96–106)
Creatinine, Ser: 0.58 mg/dL (ref 0.57–1.00)
Glucose: 90 mg/dL (ref 70–99)
Potassium: 4.1 mmol/L (ref 3.5–5.2)
Sodium: 138 mmol/L (ref 134–144)
eGFR: 114 mL/min/{1.73_m2} (ref >59)

## 2022-01-28 LAB — CBC
Hematocrit: 31.5 % — ABNORMAL LOW (ref 34.0–46.6)
Hemoglobin: 9.5 g/dL — ABNORMAL LOW (ref 11.1–15.9)
MCH: 23.3 pg — ABNORMAL LOW (ref 26.6–33.0)
MCHC: 30.2 g/dL — ABNORMAL LOW (ref 31.5–35.7)
MCV: 77 fL — ABNORMAL LOW (ref 79–97)
Platelets: 295 10*3/uL (ref 150–450)
RBC: 4.08 x10E6/uL (ref 3.77–5.28)
RDW: 14.1 % (ref 11.7–15.4)
WBC: 7.7 10*3/uL (ref 3.4–10.8)

## 2022-01-28 LAB — BRAIN NATRIURETIC PEPTIDE: BNP: 152.1 pg/mL — ABNORMAL HIGH (ref 0.0–100.0)

## 2022-02-03 ENCOUNTER — Ambulatory Visit (HOSPITAL_COMMUNITY)
Admission: RE | Admit: 2022-02-03 | Discharge: 2022-02-03 | Disposition: A | Discharge status: Home / Self Care | Source: Ambulatory Visit | Attending: Physician Assistant | Admitting: Physician Assistant

## 2022-02-03 DIAGNOSIS — I5021 Acute systolic (congestive) heart failure: Secondary | ICD-10-CM | POA: Insufficient documentation

## 2022-02-03 DIAGNOSIS — R0600 Dyspnea, unspecified: Secondary | ICD-10-CM

## 2022-02-03 DIAGNOSIS — I340 Nonrheumatic mitral (valve) insufficiency: Secondary | ICD-10-CM | POA: Diagnosis not present

## 2022-02-03 LAB — ECHOCARDIOGRAM COMPLETE
Area-P 1/2: 2.56 cm2
MV M vel: 4.78 m/s
MV Peak grad: 91.4 mmHg
Radius: 0.7 cm
S' Lateral: 3 cm

## 2022-02-04 ENCOUNTER — Ambulatory Visit (INDEPENDENT_AMBULATORY_CARE_PROVIDER_SITE_OTHER)

## 2022-02-04 ENCOUNTER — Other Ambulatory Visit: Payer: Self-pay

## 2022-02-04 VITALS — BP 101/50 | HR 62 | Temp 97.6°F | Ht 63.0 in | Wt 139.1 lb

## 2022-02-04 DIAGNOSIS — L308 Other specified dermatitis: Secondary | ICD-10-CM

## 2022-02-04 DIAGNOSIS — K255 Chronic or unspecified gastric ulcer with perforation: Secondary | ICD-10-CM | POA: Diagnosis not present

## 2022-02-04 DIAGNOSIS — F411 Generalized anxiety disorder: Secondary | ICD-10-CM | POA: Diagnosis not present

## 2022-02-04 MED ORDER — ESCITALOPRAM OXALATE 10 MG PO TABS: 20.0000 mg | ORAL_TABLET | Freq: Every day | ORAL | 1 refills | Status: DC

## 2022-02-04 MED ORDER — HYDROXYZINE HCL 10 MG PO TABS: 10.0000 mg | ORAL_TABLET | Freq: Three times a day (TID) | ORAL | 0 refills | Status: DC | PRN

## 2022-02-04 NOTE — Progress Notes (Signed)
Established Patient Office Visit  Subjective   Patient ID: Kimberly Terrell. Hegel, female    DOB: 1975/12/14  Age: 46 y.o. MRN: [id number]  Chief Complaint  Patient presents with   Follow-up    Kimberly Terrell is a 46 y/o female with a pmh outlined below. Please see encounter tab for HPI and A/P information.      Review of Systems  All other systems reviewed and are negative.     Objective:     BP (!) 101/50 (BP Location: Right Arm, Patient Position: Sitting, Cuff Size: Normal)   Pulse 62   Temp 97.6 F (36.4 C) (Oral)   Ht 5\' 3"  (1.6 m)   Wt 139 lb 1.6 oz (63.1 kg)   SpO2 100%   BMI 24.64 kg/m    Physical Exam Constitutional:      General: She is not in acute distress.    Appearance: Normal appearance. She is normal weight. She is not ill-appearing.  Eyes:     General: No scleral icterus.    Conjunctiva/sclera: Conjunctivae normal.  Cardiovascular:     Rate and Rhythm: Normal rate and regular rhythm.     Pulses: Normal pulses.     Heart sounds: Normal heart sounds. No murmur heard.    No friction rub. No gallop.  Pulmonary:     Effort: Pulmonary effort is normal. No respiratory distress.     Breath sounds: Normal breath sounds. No wheezing or rales.  Abdominal:     General: Abdomen is flat. Bowel sounds are normal. There is no distension.     Palpations: Abdomen is soft. There is no mass.     Tenderness: There is no abdominal tenderness. There is no guarding or rebound.     Comments: Midline ex-lap scar healing well without erythema, drainage, fluctuance or induration, dehiscence and minimal ptp.  Musculoskeletal:     Right lower leg: No edema.     Left lower leg: No edema.  Skin:    General: Skin is warm and dry.     Capillary Refill: Capillary refill takes less than 2 seconds.     Coloration: Skin is not jaundiced or pale.     Findings: No bruising, erythema, lesion or rash.  Neurological:     Mental Status: She is alert.  Psychiatric:        Mood and  Affect: Mood normal.        Behavior: Behavior normal.        Thought Content: Thought content normal.        Judgment: Judgment normal.      No results found for any visits on 02/04/22.    The ASCVD Risk score (Arnett DK, et al., 2019) failed to calculate for the following reasons:   The patient has a prior MI or stroke diagnosis    Assessment & Plan:   Problem List Items Addressed This Visit       Digestive   Gastric ulcer    Recently hospitalized for perforated gastric ulcer with ex/lap and repair. Vertical midline surgical scar is healing well. Patient recently saw surgeon and he recommended mederma once scar was formed for cosmetic purposes. She is endorsing some stabbing pain radiating laterally from the site every few days which does resolve. No associated N/V, diarrhea or constipation. Discussed that musculature and nerves were severed and as these are stretched and heal there will be pain similar to what she is experiencing. Will continue to monitor.  Musculoskeletal and Integument   Pruritic dermatitis - Primary    Since hospitalization 12/03/2021 for perforated gastric ulcer, patient reports intense daily itching that is worse in the afternoon and improves within an hour with cortisone cream and benedryl. She endorses a rash that accompanies this at times but is not always there. The rash is reported to be small white papules that look like goose bumps with no crusting, oozing, scale, crust, vesicular change. The patient says it is her full body and there is no predilection for the extensor vs flexural surfaces. She denies any new medications, detergents/soaps, topical applicants, new foods or food brands. She has had the same pets for 4-10 years and no one else in the house has experienced this. There was no rash present while in the office and the patient was advised to take pictures for the next visit. Given the description of the rash and the quick onset and  resolution with no flexural vs extensor predominance this is not consistent with psoriasis, eczema, lichen planus, prurigo nodularis. Of note recent CMP was negative for elevated bilirubin, triglycerides were only 171 on lipid panel done at the same time, and eosinophils were only minimally elevated to 0.5. The patient does not take opioids, and has some meds with pruritus as rare side effects but she has been on these for a long time so do not suspect these are the cause. She says her surgeon has seen this with stress. This could be psychogenic but would like to give it time and see pictures of the rash and can consider derm referral if bothersome and not improving. Counseled to continue cortisone cream, eucerine body wash and Dr. Judy Pimple, and will also start prn hydroxyzine.      Relevant Medications   hydrOXYzine (ATARAX) 10 MG tablet     Other   GAD (generalized anxiety disorder)    Patient endorses a history of being on lexipro 20, is currently on 10mg  daily and not having control of her anxiety. Will increase dose to 20mg  daily. Can add buspar if no benefit in a few weeks.      Relevant Medications   hydrOXYzine (ATARAX) 10 MG tablet   escitalopram (LEXAPRO) 10 MG tablet    No follow-ups on file.    , MD

## 2022-02-04 NOTE — Progress Notes (Deleted)
01/04/2022  Noted during recent hospitalization. Suspected to be stress-induced from perforated gastric ulcer. Most recent echo demonstrated EF 55-60% with improved LV function. Sherryll Burger was stopped and discharged on home metoprolol. No tachycardia today. Patient has f/u appointment with cardiology tomorrow.   Plan: - Recommend f/u with cardiology   S/p repair gastric perf- normal follow up with gen surg 11/3 Asthma  GERD gastric ulcer Protonix 40  Thrombocytosis-resolved on cbc 11/01  Dyspnea Recent cards workup, echo normal. Coronary calcium score 0  HCM Pap Flu colonoscopy

## 2022-02-04 NOTE — Patient Instructions (Signed)
Thank you, Kimberly Terrell for allowing [place] to provide your care today. Today we discussed :  Itching- This does not seem to be coming from your gallbladder, high fats in the blood, psoriasis, lichen planus, prurigo nodularis, hives, medication side effects, etc. Lets give it a bit more time with the soaps you are using, topical steroid, and hydroxyzine as needed.  Irritability- We have increased your lexipro. Hydroxyzine may also help.      I have ordered the following medication/changed the following medications:   Stop the following medications: Medications Discontinued During This Encounter  Medication Reason   escitalopram (LEXAPRO) 10 MG tablet Reorder     Start the following medications: Meds ordered this encounter  Medications   hydrOXYzine (ATARAX) 10 MG tablet    Sig: Take 1 tablet (10 mg total) by mouth 3 (three) times daily as needed.    Dispense:  30 tablet    Refill:  0   escitalopram (LEXAPRO) 10 MG tablet    Sig: Take 2 tablets (20 mg total) by mouth daily.    Dispense:  60 tablet    Refill:  1       We look forward to seeing you next time. Please call our clinic at [phone] if you have any questions or concerns. The best time to call is Monday-Friday from 9am-4pm, but there is someone available 24/7. If after hours or the weekend, call the main hospital number and ask for the Internal Medicine Resident On-Call. If you need medication refills, please notify your pharmacy one week in advance and they will send [place] a request.   Thank you for trusting me with your care. Wishing you the best!   Joselyn Glassman Alleigh Mollica,MD [facility]

## 2022-02-04 NOTE — Assessment & Plan Note (Signed)
Since hospitalization 12/03/2021 for perforated gastric ulcer, patient reports intense daily itching that is worse in the afternoon and improves within an hour with cortisone cream and benedryl. She endorses a rash that accompanies this at times but is not always there. The rash is reported to be small white papules that look like goose bumps with no crusting, oozing, scale, crust, vesicular change. The patient says it is her full body and there is no predilection for the extensor vs flexural surfaces. She denies any new medications, detergents/soaps, topical applicants, new foods or food brands. She has had the same pets for 4-10 years and no one else in the house has experienced this. There was no rash present while in the office and the patient was advised to take pictures for the next visit. Given the description of the rash and the quick onset and resolution with no flexural vs extensor predominance this is not consistent with psoriasis, eczema, lichen planus, prurigo nodularis. Of note recent CMP was negative for elevated bilirubin, triglycerides were only 171 on lipid panel done at the same time, and eosinophils were only minimally elevated to 0.5. The patient does not take opioids, and has some meds with pruritus as rare side effects but she has been on these for a long time so do not suspect these are the cause. She says her surgeon has seen this with stress. This could be psychogenic but would like to give it time and see pictures of the rash and can consider derm referral if bothersome and not improving. Counseled to continue cortisone cream, eucerine body wash and Dr. Judy Pimple, and will also start prn hydroxyzine.

## 2022-02-04 NOTE — Assessment & Plan Note (Signed)
Patient endorses a history of being on lexipro 20, is currently on 10mg  daily and not having control of her anxiety. Will increase dose to 20mg  daily. Can add buspar if no benefit in a few weeks.

## 2022-02-04 NOTE — Assessment & Plan Note (Signed)
Recently hospitalized for perforated gastric ulcer with ex/lap and repair. Vertical midline surgical scar is healing well. Patient recently saw surgeon and he recommended mederma once scar was formed for cosmetic purposes. She is endorsing some stabbing pain radiating laterally from the site every few days which does resolve. No associated N/V, diarrhea or constipation. Discussed that musculature and nerves were severed and as these are stretched and heal there will be pain similar to what she is experiencing. Will continue to monitor.

## 2022-02-05 NOTE — Progress Notes (Signed)
Internal Medicine Clinic Attending  I saw and evaluated the patient.  I personally confirmed the key portions of the history and exam documented by Dr. Rogers and I reviewed pertinent patient test results.  The assessment, diagnosis, and plan were formulated together and I agree with the documentation in the resident's note.  

## 2022-02-17 ENCOUNTER — Ambulatory Visit: Admitting: Physician Assistant

## 2022-02-18 ENCOUNTER — Other Ambulatory Visit: Payer: Self-pay | Admitting: Physician Assistant

## 2022-02-26 ENCOUNTER — Other Ambulatory Visit: Payer: Self-pay

## 2022-02-26 DIAGNOSIS — F411 Generalized anxiety disorder: Secondary | ICD-10-CM

## 2022-02-26 NOTE — Telephone Encounter (Signed)
Next appt scheduled 03/05/22 with Dr Courtney Paris.

## 2022-03-02 NOTE — Progress Notes (Deleted)
Cardiology Office Note:    Date:  03/02/2022   ID:  Kimberly Terrell 1975-04-30, MRN [id number]  PCP:  [facility]   [facility] Providers Cardiologist:  Charlton Haws, MD { Click to update primary MD,subspecialty MD or APP then REFRESH:1}    Referring MD: Jonita Albee, [facility]   No chief complaint on file. ***  History of Present Illness:    Kimberly Terrell is a 46 y.o. female with a hx of anxiety, depression, sinus tachcyardia and H pylori x 2. She was recently hospitalized for peritonitis with perforated peptic ulcer requiring ex lap 12/04/21 - she had a perforated distal body posterior wall stomach ulcer with 1.5 L gastric contents in abdominal cavity and peritonitis. Cardiology consulted for sinus tachcyadia and elevated troponin. Initial echo showed LVEF 40-45% with distal septal, apical, and inferior basal hypokinesis. High suspicion for stress-induced cardiomyopathy. Repeat limited echo 12/14/21 showed LVEF 55-60% with no RWMA. She underwent outpatient CT coronary which showed a coronary calcium score of zero and no evidence of CAD. She is not on ASA due to no CAD and hx of PUD. Elevated troponin felt demand ischemia. ASCVD < 5%, statin was not added for LDL 82. GDMT titrated to include low dose entresto and toprol. Sherryll Burger was not continued outpatient given recovery of EF.  I saw her in clinic 01/27/2022 for hospital follow-up.  She reported shortness of breath that was worse over the last 2 days.  However this was complicated by a recent 3-day cruise in which she ate increased sodium.  After review of her echocardiogram and CCTA, I suspected her shortness of breath was likely related to increased sodium intake and started a short course of Lasix. Repeat echo showed LVEF 55% , moderate MR.   She presents back for follow up.      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 pylori infection    s/p triple therapy x 2   Migraines     Past Surgical History:  Procedure Laterality Date   COSMETIC SURGERY  2008   breast implants    LAPAROTOMY N/A 12/04/2021   Procedure: EXPLORATORY LAPAROTOMY;  Surgeon: Harriette Bouillon, MD;  Location: [facility];  Service: General;  Laterality: N/A;    Current Medications: No outpatient medications have been marked as taking for the 03/08/22 encounter (Appointment) with Marcelino Duster, PA.     Allergies:   Imitrex [sumatriptan], Tape, and Aspirin   Social History   Socioeconomic History   Marital status: Married    Spouse name: Giovanni Bath   Number of children: 1   Years of education: Not on file   Highest education level: Not on file  Occupational History   Occupation: [employer]  Tobacco Use   Smoking status: Former  Packs/day: 1.00    Types: Cigarettes    Quit date: 12/03/2021    Years since quitting: 0.2   Smokeless tobacco: Never  Vaping Use   Vaping Use: Never used  Substance and Sexual Activity   Alcohol use: No   Drug use: No   Sexual activity: Yes    Birth control/protection: I.U.D., Surgical    Comment: husband has had a vasectomy  Other Topics Concern   Not on file  Social History Narrative   Right handed    Lives with [place] Mansel   Caffeine use: coffee-daily   Social Determinants of Health   Financial Resource Strain: Low Risk  (07/10/2019)   Overall Financial Resource Strain (CARDIA)     Difficulty of Paying Living Expenses: Not hard at all  Food Insecurity: No Food Insecurity (12/11/2021)   Hunger Vital Sign    Worried About Running Out of Food in the Last Year: Never true    Ran Out of Food in the Last Year: Never true  Transportation Needs: No Transportation Needs (12/11/2021)   PRAPARE - Administrator, Civil Service (Medical): No    Lack of Transportation (Non-Medical): No  Physical Activity: Sufficiently Active (07/10/2019)   Exercise Vital Sign    Days of Exercise per Week: 3 days    Minutes of Exercise per Session: 60 min  Stress: No Stress Concern Present (07/10/2019)   [employer] of Occupational Health - Occupational Stress Questionnaire    Feeling of Stress : Not at all  Social Connections: Moderately Isolated (07/10/2019)   Social Connection and Isolation Panel [NHANES]    Frequency of Communication with Friends and Family: Once a week    Frequency of Social Gatherings with Friends and Family: Once a week    Attends Religious Services: 1 to 4 times per year    Active Member of [employer] or Organizations: No    Attends Banker Meetings: Never    Marital Status: Married     Family History: The patient's ***family history includes Depression in her daughter and mother. There is no history of Colon cancer, Esophageal cancer, or Gastric cancer.  ROS:   Please see the history of present illness.    *** All other systems reviewed and are negative.  EKGs/Labs/Other Studies Reviewed:    The following studies were reviewed today:  Echo 02/03/22:  1. Left ventricular ejection fraction by 3D volume is 55 %. The left  ventricle has normal function. The left ventricle has no regional wall  motion abnormalities. Left ventricular diastolic parameters are  indeterminate.   2. Right ventricular systolic function is normal. The right ventricular  size is normal. There is normal pulmonary artery systolic pressure.   3. The mitral valve is  normal in structure. Moderate mitral valve  regurgitation. No evidence of mitral stenosis.   4. The aortic valve is normal in structure. Aortic valve regurgitation is  not visualized. No aortic stenosis is present.   5. The inferior vena cava is normal in size with greater than 50%  respiratory variability, suggesting right atrial pressure of 3 mmHg.    CT coronary 2023: IMPRESSION: 1.  Calcium score 0   2.  Normal right dominant coronary arteries   3.  Normal ascending thoracic aorta 2.8 cm     Complete Echocardiogram 12/04/2021: Impressions:  1. Distal septal/ apical/ inferior basal hypokinesis No obvious mural  thrombus but would consider repeating TTE after emergent surgery for  perforated gastric ulcer when HR  lower and images better . Left  ventricular ejection fraction, by estimation, is  40 to 45%. The left ventricle has mildly decreased function. The left  ventricle has no regional wall motion abnormalities. Left ventricular  diastolic parameters are indeterminate.   2. Right ventricular systolic function is mildly reduced. The right  ventricular size is mildly enlarged. There is normal pulmonary artery  systolic pressure.   3. The mitral valve is normal in structure. No evidence of mitral valve  regurgitation. No evidence of mitral stenosis.   4. The aortic valve is tricuspid. Aortic valve regurgitation is not  visualized. No aortic stenosis is present.   5. The inferior vena cava is normal in size with greater than 50%  respiratory variability, suggesting right atrial pressure of 3 mmHg.  _______________   Limited Echocardiogram 12/14/2021: Impressions:  1. Left ventricular ejection fraction, by estimation, is 55 to 60%. The  left ventricle has normal function. The left ventricle has no regional  wall motion abnormalities   2. Side by Side images were compared from prior study 12/04/21 showing  improved LVF.     EKG:  EKG is *** ordered today.  The ekg ordered  today demonstrates ***  Recent Labs: 12/05/2021: ALT 26 12/15/2021: Magnesium 1.9 01/27/2022: BNP 152.1; BUN 7; Creatinine, Ser 0.58; Hemoglobin 9.5; Platelets 295; Potassium 4.1; Sodium 138  Recent Lipid Panel    Component Value Date/Time   CHOL 133 12/12/2021 0233   CHOL 184 05/04/2019 1058   TRIG 171 (H) 12/12/2021 0233   HDL 17 (L) 12/12/2021 0233   HDL 49 05/04/2019 1058   CHOLHDL 7.8 12/12/2021 0233   VLDL 34 12/12/2021 0233   LDLCALC 82 12/12/2021 0233   LDLCALC 120 (H) 05/04/2019 1058     Risk Assessment/Calculations:   {Does this patient have ATRIAL FIBRILLATION?:[id number]}  No BP recorded.  {Refresh Note OR Click here to enter BP  :1}***         Physical Exam:    VS:  There were no vitals taken for this visit.    Wt Readings from Last 3 Encounters:  02/04/22 139 lb 1.6 oz (63.1 kg)  01/27/22 135 lb 9.6 oz (61.5 kg)  01/04/22 130 lb 6.4 oz (59.1 kg)     GEN: *** Well nourished, well developed in no acute distress HEENT: Normal NECK: No JVD; No carotid bruits LYMPHATICS: No lymphadenopathy CARDIAC: ***RRR, no murmurs, rubs, gallops RESPIRATORY:  Clear to auscultation without rales, wheezing or rhonchi  ABDOMEN: Soft, non-tender, non-distended MUSCULOSKELETAL:  No edema; No deformity  SKIN: Warm and dry NEUROLOGIC:  Alert and oriented x 3 PSYCHIATRIC:  Normal affect   ASSESSMENT:    No diagnosis found. PLAN:    In order of problems listed above:  ***      {Are you ordering a CV Procedure (e.g. stress test, cath, DCCV, TEE, etc)?   Press F2        :[id number]}    Medication Adjustments/Labs and Tests Ordered: Current medicines are reviewed at length with the patient today.  Concerns regarding medicines are outlined above.  No orders of the defined types were placed in this encounter.  No orders of the defined types were placed in this encounter.   There are no Patient Instructions on file for this visit.   Signed, Marcelino Duster, PA   03/02/2022 12:16 PM    [facility]

## 2022-03-05 ENCOUNTER — Ambulatory Visit (INDEPENDENT_AMBULATORY_CARE_PROVIDER_SITE_OTHER): Admitting: Student

## 2022-03-05 ENCOUNTER — Encounter: Payer: Self-pay | Admitting: Student

## 2022-03-05 ENCOUNTER — Other Ambulatory Visit: Payer: Self-pay

## 2022-03-05 VITALS — BP 110/48 | HR 69 | Temp 98.0°F | Ht 63.0 in | Wt 146.0 lb

## 2022-03-05 DIAGNOSIS — Z87891 Personal history of nicotine dependence: Secondary | ICD-10-CM

## 2022-03-05 DIAGNOSIS — L308 Other specified dermatitis: Secondary | ICD-10-CM | POA: Diagnosis not present

## 2022-03-05 DIAGNOSIS — G4700 Insomnia, unspecified: Secondary | ICD-10-CM

## 2022-03-05 DIAGNOSIS — F411 Generalized anxiety disorder: Secondary | ICD-10-CM | POA: Diagnosis not present

## 2022-03-05 DIAGNOSIS — F339 Major depressive disorder, recurrent, unspecified: Secondary | ICD-10-CM

## 2022-03-05 DIAGNOSIS — D649 Anemia, unspecified: Secondary | ICD-10-CM

## 2022-03-05 DIAGNOSIS — I5021 Acute systolic (congestive) heart failure: Secondary | ICD-10-CM

## 2022-03-05 DIAGNOSIS — K219 Gastro-esophageal reflux disease without esophagitis: Secondary | ICD-10-CM

## 2022-03-05 MED ORDER — HYDROXYZINE HCL 10 MG PO TABS: 10.0000 mg | ORAL_TABLET | Freq: Three times a day (TID) | ORAL | 0 refills | Status: DC | PRN

## 2022-03-05 MED ORDER — BUSPIRONE HCL 7.5 MG PO TABS: 7.5000 mg | ORAL_TABLET | Freq: Two times a day (BID) | ORAL | 1 refills | Status: DC

## 2022-03-05 MED ORDER — PANTOPRAZOLE SODIUM 40 MG PO TBEC: 40.0000 mg | DELAYED_RELEASE_TABLET | Freq: Every day | ORAL | 3 refills | Status: DC

## 2022-03-05 MED ORDER — TRAZODONE HCL 100 MG PO TABS: 100.0000 mg | ORAL_TABLET | Freq: Every day | ORAL | 3 refills | Status: DC

## 2022-03-05 NOTE — Patient Instructions (Signed)
  Thank you, [name], for allowing [place] to provide your care today. Today we discussed . . .  > Anxiety       -After increasing the Lexapro to 20 mg daily you are still having some irritability.  We talked about restarting BuSpar which you have been on in the past with some benefit.  We will start BuSpar 7.5 mg twice a day which she may increase to 15 mg twice a day after about a week if you do not see any improvement.  If you have any issues or side effects please not hesitate to call our office. > Itching       -It sounds like your itching is getting a lot better and that the hydroxyzine has been helping with the flares.  We will refill this medication to use 3 times a day if needed. > Insomnia       -It sounds like trazodone has worked well in the past but recently has not been working as well.  We are can increase her dose from 50 mg to 100 mg at night.  If this does not have any benefit please not hesitate to call our office.   I have ordered the following labs for you:  Lab Orders  No laboratory test(s) ordered today      Tests ordered today:  None   Referrals ordered today:   Referral Orders  No referral(s) requested today      I have ordered the following medication/changed the following medications:   Stop the following medications: Medications Discontinued During This Encounter  Medication Reason   traZODone (DESYREL) 50 MG tablet Dose change   zolpidem (AMBIEN) 5 MG tablet Patient has not taken in last 30 days   Oxcarbazepine (TRILEPTAL) 300 MG tablet Patient has not taken in last 30 days   methocarbamol (ROBAXIN) 500 MG tablet Patient has not taken in last 30 days   pantoprazole (PROTONIX) 40 MG tablet Reorder   hydrOXYzine (ATARAX) 10 MG tablet Reorder     Start the following medications: Meds ordered this encounter  Medications   busPIRone (BUSPAR) 7.5 MG tablet    Sig: Take 1 tablet (7.5 mg total) by mouth 2 (two) times daily. You may increase  to 15 mg twice daily after 1 week if not effective enough.    Dispense:  60 tablet    Refill:  1   hydrOXYzine (ATARAX) 10 MG tablet    Sig: Take 1 tablet (10 mg total) by mouth 3 (three) times daily as needed.    Dispense:  90 tablet    Refill:  0   traZODone (DESYREL) 100 MG tablet    Sig: Take 1 tablet (100 mg total) by mouth at bedtime.    Dispense:  30 tablet    Refill:  3   pantoprazole (PROTONIX) 40 MG tablet    Sig: Take 1 tablet (40 mg total) by mouth daily.    Dispense:  90 tablet    Refill:  3      Follow up: 2 months    Remember:     Should you have any questions or concerns please call the internal medicine clinic at [phone].     Kimberly Morel, DO [facility]

## 2022-03-05 NOTE — Progress Notes (Unsigned)
   CC: Follow-up visit  HPI:  Kimberly Terrell is a 46 y.o. female with PMH as below who presents to the clinic for follow-up visit.  Please see encounters tab for problem-based charting.  Past Medical History:  Diagnosis Date   Anxiety    Arthritis    Asthma    Bursitis    Right Hip and Right Knee   Depression    H. pylori infection    s/p triple therapy x 2   Migraines    Review of Systems:   A comprehensive review of systems was negative except for: intermittent agitation, insomnia    Physical Exam:  Vitals:   03/05/22 1039  BP: (!) 110/48  Pulse: 69  Temp: 98 F (36.7 C)  TempSrc: Oral  SpO2: 98%  Weight: 146 lb (66.2 kg)  Height: 5\' 3"  (1.6 m)    Constitutional: Well-appearing middle-aged female. In no acute distress. HENT: Normocephalic, atraumatic,  Eyes: Sclera non-icteric, EOM intact Cardio:Regular rate and rhythm.  2+ bilateral radial pulses. Pulm:Clear to auscultation bilaterally. Normal work of breathing on room air. Abdomen: Soft, non-tender, non-distended, positive bowel sounds. for extremity edema. Skin:Warm and dry.  Well-healing midline scar in the epigastric region. Neuro:Alert and oriented x3. No focal deficit noted. Psych:Pleasant mood and affect.   Assessment & Plan:   Pruritic dermatitis Patient with pruritic dermatitis that has been treated with cortisone cream and as needed hydroxyzine as well as topical lotions and ointments.  She notes steady improvement and that hydroxyzine has worked really well to control her symptoms whenever they become worse. - Refill hydroxyzine 10 mg 3 times daily as needed  GAD (generalized anxiety disorder) Patient has noticed an improvement in her anxiety after increasing Lexapro to 20 mg daily but is still having some bothersome agitation intermittently.  She has done well on BuSpar in the past for symptoms like this. - Continue Lexapro 20 mg daily - Start BuSpar 7.5 mg twice daily, can  increase to 15 mg twice daily if not effective  Insomnia Patient with a history of insomnia that has been treated with trazodone 50 mg daily with benefit.  However recently she has been not having as good effect with 50 mg.  She is also tried Ambien in the past and notes that it worked very well but she would like to avoid this medication due to the potential side effects. - Increase trazodone to 100 mg nightly  Acute heart failure (HCC) Patient with newly diagnosed heart failure with initial echo showing LVEF of 40 to 45% with hypokinesis of the distal septal, apical and inferior basal walls.  Leading thought is stress-induced cardiomyopathy secondary to perforated gastric ulcer.  Repeat echo on 12/14/2021 showed LVEF of 55 to 60% with no wall motion abnormalities.  Coronary CT with a calcium score of 0 and no evidence of CAD.  She continues to follow-up with cardiology and has not had any new or worsening symptoms. - Continue metoprolol succinate 25 mg daily and continue to follow-up with cardiology    Patient seen with Dr. 12/16/2021, DO [facility] Internal Medicine Resident PGY-1 Pager: [phone]

## 2022-03-08 ENCOUNTER — Ambulatory Visit: Admitting: Physician Assistant

## 2022-03-09 DIAGNOSIS — G4700 Insomnia, unspecified: Secondary | ICD-10-CM | POA: Insufficient documentation

## 2022-03-09 NOTE — Assessment & Plan Note (Signed)
Patient with pruritic dermatitis that has been treated with cortisone cream and as needed hydroxyzine as well as topical lotions and ointments.  She notes steady improvement and that hydroxyzine has worked really well to control her symptoms whenever they become worse. - Refill hydroxyzine 10 mg 3 times daily as needed

## 2022-03-09 NOTE — Assessment & Plan Note (Addendum)
Patient has noticed an improvement in her anxiety after increasing Lexapro to 20 mg daily but is still having some bothersome agitation intermittently.  She has done well on BuSpar in the past for symptoms like this. - Continue Lexapro 20 mg daily - Start BuSpar 7.5 mg twice daily, can increase to 15 mg twice daily if not effective

## 2022-03-09 NOTE — Assessment & Plan Note (Signed)
Patient with newly diagnosed heart failure with initial echo showing LVEF of 40 to 45% with hypokinesis of the distal septal, apical and inferior basal walls.  Leading thought is stress-induced cardiomyopathy secondary to perforated gastric ulcer.  Repeat echo on 12/14/2021 showed LVEF of 55 to 60% with no wall motion abnormalities.  Coronary CT with a calcium score of 0 and no evidence of CAD.  She continues to follow-up with cardiology and has not had any new or worsening symptoms. - Continue metoprolol succinate 25 mg daily and continue to follow-up with cardiology

## 2022-03-09 NOTE — Progress Notes (Signed)
Internal Medicine Clinic Attending  I saw and evaluated the patient.  I personally confirmed the key portions of the history and exam documented by Dr. Goodwin and I reviewed pertinent patient test results.  The assessment, diagnosis, and plan were formulated together and I agree with the documentation in the resident's note.  

## 2022-03-09 NOTE — Assessment & Plan Note (Signed)
Patient with a history of insomnia that has been treated with trazodone 50 mg daily with benefit.  However recently she has been not having as good effect with 50 mg.  She is also tried Ambien in the past and notes that it worked very well but she would like to avoid this medication due to the potential side effects. - Increase trazodone to 100 mg nightly

## 2022-03-12 ENCOUNTER — Other Ambulatory Visit: Payer: Self-pay | Admitting: Student

## 2022-03-12 DIAGNOSIS — F411 Generalized anxiety disorder: Secondary | ICD-10-CM

## 2022-03-15 ENCOUNTER — Telehealth: Payer: Self-pay | Admitting: Student

## 2022-03-15 ENCOUNTER — Other Ambulatory Visit: Payer: Self-pay | Admitting: Student

## 2022-03-15 DIAGNOSIS — F411 Generalized anxiety disorder: Secondary | ICD-10-CM

## 2022-03-15 MED ORDER — BUSPIRONE HCL 7.5 MG PO TABS: 15.0000 mg | ORAL_TABLET | Freq: Two times a day (BID) | ORAL | 3 refills | Status: DC

## 2022-03-15 NOTE — Telephone Encounter (Signed)
Returned call to patient. States she has been taking Lexapro 20 mg daily and Buspar 7.5 mg BID. States she is still feeling "edgy." Requests to increase Buspar to 15 mg BID. Per OV note on 12/8: Start BuSpar 7.5 mg twice daily, can increase to 15 mg twice daily if not effective.  Please send Rx for [facility] 15 [facility] [place].

## 2022-03-15 NOTE — Telephone Encounter (Signed)
Pt requesting a call back about an increase on the following medication.  Please call the patient back.  busPIRone (BUSPAR) 7.5 MG tablet   [id number] - [place], [place] - [address]

## 2022-03-16 ENCOUNTER — Other Ambulatory Visit: Payer: Self-pay | Admitting: Student

## 2022-03-16 DIAGNOSIS — F411 Generalized anxiety disorder: Secondary | ICD-10-CM

## 2022-03-16 NOTE — Progress Notes (Signed)
Erroneous Encounter

## 2022-03-27 ENCOUNTER — Other Ambulatory Visit: Payer: Self-pay | Admitting: Student

## 2022-03-27 DIAGNOSIS — G4700 Insomnia, unspecified: Secondary | ICD-10-CM

## 2022-03-27 DIAGNOSIS — L308 Other specified dermatitis: Secondary | ICD-10-CM

## 2022-03-30 ENCOUNTER — Encounter: Payer: Self-pay | Admitting: Orthopaedic Surgery

## 2022-03-30 ENCOUNTER — Ambulatory Visit (INDEPENDENT_AMBULATORY_CARE_PROVIDER_SITE_OTHER): Admitting: Orthopaedic Surgery

## 2022-03-30 DIAGNOSIS — G5761 Lesion of plantar nerve, right lower limb: Secondary | ICD-10-CM | POA: Diagnosis not present

## 2022-03-30 MED ORDER — METHYLPREDNISOLONE ACETATE 40 MG/ML IJ SUSP
40.0000 mg | INTRAMUSCULAR | Status: AC | PRN
  Administered 2022-03-30: 40 mg via INTRA_ARTICULAR

## 2022-03-30 MED ORDER — LIDOCAINE HCL 1 % IJ SOLN
1.0000 mL | INTRAMUSCULAR | Status: AC | PRN
  Administered 2022-03-30: 1 mL

## 2022-03-30 NOTE — Telephone Encounter (Signed)
Reviewed last OV note. Will approve and reassess at next follow up. Thank you

## 2022-03-30 NOTE — Progress Notes (Signed)
Office Visit Note   Patient: Kimberly Terrell. Kimberly Terrell           Date of Birth: September 18, 1975           MRN: [id number] Visit Date: 03/30/2022              Requested by: Romana Juniper, MD [address] [place],  [place] [address] PCP: Romana Juniper, MD   Assessment & Plan: Visit Diagnoses:  1. Morton's neuroma of right foot     Plan: Pleasant 48 year old woman with a history of right foot second webspace Morton's neuroma.  She had an injection 7 months ago and reports 3 months of relief.  She has now had recurrence of the symptoms and is requesting another injection.  Will go forward with that today.  Discussed surgery with her and recovery time if she chooses to go that route.  She is aware that she may only have temporary relief from the cortisone injections that is an alternative as well.  Follow-Up Instructions: Return if symptoms worsen or fail to improve.   Orders:  Orders Placed This Encounter  Procedures   Small Joint Inj   No orders of the defined types were placed in this encounter.     Procedures: Small Joint Inj on 03/30/2022 2:05 PM Indications: pain and diagnostic evaluation Details: 27 G needle, dorsal approach  Spinal Needle: No  Medications: 1 mL lidocaine 1 %; 40 mg methylPREDNISolone acetate 40 MG/ML Outcome: tolerated well, no immediate complications  After verbal consent her second webspace was prepped with alcohol and Betadine.  1 cc of 40 mg Depo-Medrol and 1 cc of lidocaine were injected she tolerated this well Procedure, treatment alternatives, risks and benefits explained, specific risks discussed. Consent was given by the patient.      Clinical Data: No additional findings.   Subjective: Chief Complaint  Patient presents with   Right Foot - Pain    HPI Kimberly Terrell is a pleasant 47 year old woman with a history of a right foot second webspace Morton's neuroma.  She did get good relief for 3 months with her last injection several months  ago.  She has now had recurrence of her symptoms and is requesting another injection  Review of Systems  All other systems reviewed and are negative.    Objective: Vital Signs: LMP 03/03/2022   Physical Exam Constitutional:      Appearance: Normal appearance.  Pulmonary:     Effort: Pulmonary effort is normal.  Skin:    General: Skin is warm and dry.  Neurological:     Mental Status: She is alert.     Ortho Exam Examination of her right foot her right foot is warm no erythema compartments are soft she has palpable dorsalis pedis pulse with capillary refill less than 2 seconds.  She does have some deviation of the second and third toes.  Tender in the second webspace of the right foot.  Neurovascular intact.  Skin intact Specialty Comments:  No specialty comments available.  Imaging: No results found.   PMFS History: Patient Active Problem List   Diagnosis Date Noted   Insomnia 03/09/2022   Pruritic dermatitis 02/04/2022   Acute heart failure ([place]) 01/04/2022   Thrombocytosis 01/04/2022   Demand ischemia    Perforated viscus 12/04/2021   Gastric ulcer 12/04/2021   Morton's neuroma of right foot 09/23/2021   Bunion of left foot 09/23/2021   Controlled substance agreement broken 04/09/2020   Benzodiazepine dependence ([place]) 04/09/2020  Shoulder pain, bilateral 02/27/2020   Asthma 10/30/2019   Chronic hip pain 10/30/2019   TOA (tubo-ovarian abscess) 07/04/2019   IUD infection ([place]) 07/04/2019   Gastroesophageal reflux disease without esophagitis 05/04/2019   H. pylori infection 03/28/2019   Abnormal CT of the abdomen 03/28/2019   Chronic daily headache 10/12/2017   Allergic rhinitis 07/25/2017   Depression, recurrent ([place]) 04/04/2017   Migraine 04/04/2017   Bursitis of right hip 04/04/2017   GAD (generalized anxiety disorder) 04/04/2017   DUB (dysfunctional uterine bleeding) 04/04/2017   Past Medical History:  Diagnosis Date   Anxiety    Arthritis     Asthma    Bursitis    Right Hip and Right Knee   Depression    H. pylori infection    s/p triple therapy x 2   Migraines     Family History  Problem Relation Age of Onset   Depression Mother    Depression Daughter    Colon cancer Neg Hx    Esophageal cancer Neg Hx    Gastric cancer Neg Hx     Past Surgical History:  Procedure Laterality Date   COSMETIC SURGERY  2008   breast implants    LAPAROTOMY N/A 12/04/2021   Procedure: EXPLORATORY LAPAROTOMY;  Surgeon: Erroll Luna, MD;  Location: [facility];  Service: General;  Laterality: N/A;   Social History   Occupational History   Occupation: [employer]  Tobacco Use   Smoking status: Former    Packs/day: 1.00    Types: Cigarettes    Quit date: 12/03/2021    Years since quitting: 0.3   Smokeless tobacco: Never   Tobacco comments:    Trying   Vaping Use   Vaping Use: Never used  Substance and Sexual Activity   Alcohol use: No   Drug use: No   Sexual activity: Yes    Birth control/protection: I.U.D., Surgical    Comment: husband has had a vasectomy

## 2022-04-01 ENCOUNTER — Ambulatory Visit: Admitting: Orthopaedic Surgery

## 2022-04-02 ENCOUNTER — Encounter: Payer: Self-pay | Admitting: Gastroenterology

## 2022-04-02 ENCOUNTER — Ambulatory Visit (INDEPENDENT_AMBULATORY_CARE_PROVIDER_SITE_OTHER): Admitting: Gastroenterology

## 2022-04-02 VITALS — BP 122/66 | HR 66 | Ht 63.0 in | Wt 147.0 lb

## 2022-04-02 DIAGNOSIS — K255 Chronic or unspecified gastric ulcer with perforation: Secondary | ICD-10-CM

## 2022-04-02 DIAGNOSIS — Z8619 Personal history of other infectious and parasitic diseases: Secondary | ICD-10-CM

## 2022-04-02 DIAGNOSIS — K275 Chronic or unspecified peptic ulcer, site unspecified, with perforation: Secondary | ICD-10-CM

## 2022-04-02 DIAGNOSIS — K649 Unspecified hemorrhoids: Secondary | ICD-10-CM

## 2022-04-02 DIAGNOSIS — Z87898 Personal history of other specified conditions: Secondary | ICD-10-CM

## 2022-04-02 DIAGNOSIS — Z1211 Encounter for screening for malignant neoplasm of colon: Secondary | ICD-10-CM | POA: Diagnosis not present

## 2022-04-02 MED ORDER — PLENVU 140 G PO SOLR: 1.0000 | ORAL | 0 refills | Status: DC

## 2022-04-02 NOTE — Patient Instructions (Signed)
You have been scheduled for an endoscopy and colonoscopy. Please follow the written instructions given to you at your visit today. Please pick up your prep supplies at the pharmacy within the next 1-3 days. If you use inhalers (even only as needed), please bring them with you on the day of your procedure.   We have sent the following medications to your pharmacy for you to pick up at your convenience: Plenvu   Due to recent changes in healthcare laws, you may see the results of your imaging and laboratory studies on MyChart before your provider has had a chance to review them.  We understand that in some cases there may be results that are confusing or concerning to you. Not all laboratory results come back in the same time frame and the provider may be waiting for multiple results in order to interpret others.  Please give [place] 48 hours in order for your provider to thoroughly review all the results before contacting the office for clarification of your results.    Thank you for choosing me and [facility].  Dr. Rush Landmark

## 2022-04-03 ENCOUNTER — Ambulatory Visit (INDEPENDENT_AMBULATORY_CARE_PROVIDER_SITE_OTHER)

## 2022-04-03 ENCOUNTER — Ambulatory Visit
Admission: RE | Admit: 2022-04-03 | Discharge: 2022-04-03 | Disposition: A | Discharge status: Home / Self Care | Source: Ambulatory Visit | Attending: Nurse Practitioner | Admitting: Nurse Practitioner

## 2022-04-03 ENCOUNTER — Encounter: Payer: Self-pay | Admitting: Gastroenterology

## 2022-04-03 VITALS — BP 130/73 | HR 77 | Temp 97.6°F | Resp 18

## 2022-04-03 DIAGNOSIS — M25511 Pain in right shoulder: Secondary | ICD-10-CM

## 2022-04-03 MED ORDER — PREDNISONE 20 MG PO TABS: 40.0000 mg | ORAL_TABLET | Freq: Every day | ORAL | 0 refills | Status: AC

## 2022-04-03 MED ORDER — DEXAMETHASONE SODIUM PHOSPHATE 10 MG/ML IJ SOLN
10.0000 mg | INTRAMUSCULAR | Status: AC
  Administered 2022-04-03: 10 mg via INTRAMUSCULAR

## 2022-04-03 MED ORDER — METHYLPREDNISOLONE SODIUM SUCC 125 MG IJ SOLR
60.0000 mg | Freq: Once | INTRAMUSCULAR | Status: AC
  Administered 2022-04-03: 60 mg via INTRAMUSCULAR

## 2022-04-03 NOTE — Progress Notes (Unsigned)
[facility] VISIT   Primary Care Provider Romana Juniper, MD [place] [id number] [phone]  Referring Provider [facility], [facility] [address] [place],  [place] [address] [phone]  Patient Profile: Kimberly Terrell is a 47 y.o. female with a pmh significant for anxiety, MDD, migraines, prior H. pylori infection, perforated gastric ulcer (status post Phillip Heal patch).  The patient presents to the [facility] for an evaluation and management of problem(s) noted below:  Problem List 1. Gastric ulcer with perforation, unspecified chronicity ([place])   2. History of Helicobacter pylori infection   3. Colon cancer screening   4. History of diarrhea     History of Present Illness This is the patient's first visit to the outpatient [facility] clinic.  In September, the patient presented with severe abdominal pain and ended up on imaging being found to have a gastric ulcer that led to an exploratory laparotomy with Phillip Heal patch with Dr. Brantley Stage.  Her risk factors for peptic ulcer disease included previous H. pylori as well as tobacco use.  She has slowly had improvement over the course of the last few months.  When last seen by surgery in November she was discharged from their care.  Patient had been experiencing issues with diarrhea for the months prior to her surgery.  Since then she has been doing okay.  Now she has softer bowel movements and at times is needing to use MiraLAX on a daily basis just to make sure that things are passing associated she does not strain.  She does not use any fiber supplementation.  She has noted rectal bleeding on a weekly basis.  She wonders if this is hemorrhoidal in nature since her childbirth.  She has never undergone colon cancer screening and is interested in that at this time.  She does not take any significant nonsteroidals or BC/Goody powders.  She is never had an upper  endoscopy.  GI Review of Systems Positive as above Negative for pyrosis, dysphagia, odynophagia, pain, melena  Review of Systems General: Denies fevers/chills/weight loss unintentionally at this point (had weight loss around the time of her surgery and hospitalization but stabilized) Cardiovascular: Denies chest pain/palpitations Pulmonary: Denies shortness of breath Gastroenterological: See HPI Genitourinary: Denies darkened urine Hematological: Denies easy bruising/bleeding Dermatological: Denies jaundice Psychological: Mood is stable   Medications Current Outpatient Medications  Medication Sig Dispense Refill   acetaminophen (TYLENOL) 500 MG tablet Take 2 tablets (1,000 mg total) by mouth every 6 (six) hours as needed. 30 tablet 0   Acetaminophen-guaiFENesin (MUCINEX COLD & FLU) 325-200 MG CAPS Take 2 tablets by mouth daily as needed (cold symptoms).     albuterol (VENTOLIN HFA) 108 (90 Base) MCG/ACT inhaler Inhale 2 puffs into the lungs every 6 (six) hours as needed for wheezing or shortness of breath. 18 g 0   busPIRone (BUSPAR) 7.5 MG tablet Take 2 tablets (15 mg total) by mouth 2 (two) times daily. You may increase to 15 mg twice daily after 1 week if not effective enough. 360 tablet 3   diclofenac sodium (VOLTAREN) 1 % GEL Apply 2 g topically 4 (four) times daily. (Patient taking differently: Apply 2 g topically 4 (four) times daily as needed (pain).) 300 g 1   escitalopram (LEXAPRO) 10 MG tablet TAKE 2 TABLETS BY MOUTH EVERY DAY 180 tablet 1   furosemide (LASIX) 40 MG tablet Take 1 tablet (40 mg total) by mouth daily. 30 tablet 0  hydrOXYzine (ATARAX) 10 MG tablet TAKE 1 TABLET BY MOUTH THREE TIMES A DAY AS NEEDED 270 tablet 1   Melatonin 10 MG CAPS Take 20 mg by mouth at bedtime.     pantoprazole (PROTONIX) 40 MG tablet Take 1 tablet (40 mg total) by mouth daily. 90 tablet 3   PEG-KCl-NaCl-NaSulf-Na Asc-C (PLENVU) 140 g SOLR Take 1 kit by mouth as directed. 1 each 0    potassium chloride (KLOR-CON) 10 MEQ tablet Take 1 tablet (10 mEq total) by mouth daily. 30 tablet 0   traZODone (DESYREL) 100 MG tablet TAKE 1 TABLET BY MOUTH EVERYDAY AT BEDTIME 90 tablet 2   metoprolol succinate (TOPROL-XL) 25 MG 24 hr tablet TAKE 1 TABLET (25 MG TOTAL) BY MOUTH DAILY. 90 tablet 0   predniSONE (DELTASONE) 20 MG tablet Take 2 tablets (40 mg total) by mouth daily with breakfast for 5 days. 10 tablet 0   No current facility-administered medications for this visit.    Allergies Allergies  Allergen Reactions   Imitrex [Sumatriptan] Other (See Comments)    "Gave me lockjaw"   Tape Other (See Comments)    The "plastic-like tape causes redness and irritation"   Aspirin Other (See Comments)    Caused chest pain    Histories Past Medical History:  Diagnosis Date   Anxiety    Arthritis    Asthma    Bursitis    Right Hip and Right Knee   Depression    H. pylori infection    s/p triple therapy x 2   Migraines    Past Surgical History:  Procedure Laterality Date   COSMETIC SURGERY  2008   breast implants    LAPAROTOMY N/A 12/04/2021   Procedure: EXPLORATORY LAPAROTOMY;  Surgeon: Erroll Luna, MD;  Location: [facility];  Service: General;  Laterality: N/A;   Social History   Socioeconomic History   Marital status: Married    Spouse name: Iliyana Convey   Number of children: 1   Years of education: Not on file   Highest education level: Not on file  Occupational History   Occupation: [employer]  Tobacco Use   Smoking status: Former    Packs/day: 1.00    Types: Cigarettes    Quit date: 12/03/2021    Years since quitting: 0.3   Smokeless tobacco: Never   Tobacco comments:    Trying   Vaping Use   Vaping Use: Never used  Substance and Sexual Activity   Alcohol use: No   Drug use: No   Sexual activity: Yes    Birth control/protection: I.U.D., Surgical    Comment: husband has had a vasectomy  Other Topics Concern   Not on file  Social History Narrative    Right handed    Lives with [place] Jenifer   Caffeine use: coffee-daily   Social Determinants of Health   Financial Resource Strain: Low Risk  (07/10/2019)   Overall Financial Resource Strain (CARDIA)    Difficulty of Paying Living Expenses: Not hard at all  Food Insecurity: No Food Insecurity (12/11/2021)   Hunger Vital Sign    Worried About Running Out of Food in the Last Year: Never true    [place] in the Last Year: Never true  Transportation Needs: No Transportation Needs (12/11/2021)   PRAPARE - Hydrologist (Medical): No    Lack of Transportation (Non-Medical): No  Physical Activity: Sufficiently Active (07/10/2019)   Exercise Vital Sign  Days of Exercise per Week: 3 days    Minutes of Exercise per Session: 60 min  Stress: No Stress Concern Present (07/10/2019)   [employer] of Occupational Health - Occupational Stress Questionnaire    Feeling of Stress : Not at all  Social Connections: Moderately Isolated (07/10/2019)   Social Connection and Isolation Panel [NHANES]    Frequency of Communication with Friends and Family: Once a week    Frequency of Social Gatherings with Friends and Family: Once a week    Attends Religious Services: 1 to 4 times per year    Active Member of [employer] or Organizations: No    Attends Banker Meetings: Never    Marital Status: Married  Catering manager Violence: Not At Risk (12/11/2021)   Humiliation, Afraid, Rape, and Kick questionnaire    Fear of Current or Ex-Partner: No    Emotionally Abused: No    Physically Abused: No    Sexually Abused: No   Family History  Problem Relation Age of Onset   Depression Mother    Depression Daughter    Colon cancer Neg Hx    Esophageal cancer Neg Hx    Gastric cancer Neg Hx    Inflammatory bowel disease Neg Hx    Liver disease Neg Hx    Pancreatic cancer Neg Hx    Rectal cancer Neg Hx    Stomach cancer Neg Hx    I have reviewed her medical, social, and  family history in detail and updated the electronic medical record as necessary.    PHYSICAL EXAMINATION  BP 122/66   Pulse 66   Ht 5\' 3"  (1.6 m)   Wt 147 lb (66.7 kg)   LMP 03/03/2022   BMI 26.04 kg/m  Wt Readings from Last 3 Encounters:  04/02/22 147 lb (66.7 kg)  03/05/22 146 lb (66.2 kg)  02/04/22 139 lb 1.6 oz (63.1 kg)  GEN: NAD, appears stated age, doesn't appear chronically ill, accompanied by husband and granddaughter PSYCH: Cooperative, without pressured speech EYE: Conjunctivae pink, sclerae anicteric ENT: MMM CV: Nontachycardic RESP: No audible wheezing GI: NABS, soft, NT/ND, without rebound or guarding, well-healed surgical scars GU: DRE deferred by patient as she is willing to undergo colonoscopy MSK/EXT: No lower extremity edema SKIN: No jaundice NEURO:  Alert & Oriented x 3, no focal deficits   REVIEW OF DATA  I reviewed the following data at the time of this encounter:  GI Procedures and Studies  No relevant studies to review  Laboratory Studies  Reviewed those in epic  Imaging Studies  September 2023 CT abdomen with contrast IMPRESSION: 1. No evidence for thoracic or abdominal aortic aneurysm or dissection. 2. Circumferential wall thickening in the mid esophagus suggests esophagitis. 3. Focal areas of tree-in-bud nodularity in both lungs suggest sequelae of atypical infection, including MAI. 4. Marked thickening and edema of the gastric wall with tiny extraluminal gas bubbles adjacent to the proximal stomach. While no definite or discrete ulcer can be identified, there are areas of gastric wall thinning anteriorly in the body of the stomach and posteriorly towards the antral region. Given the history of H pylori, perforated gastric ulcer would be a consideration for the intraperitoneal free air. 5. Moderate to large volume ascites with fluid around the liver and spleen. Average attenuation the fluid is relatively low, not favoring  hemorrhage. 6. Possible areas of segmental hypoperfusion in the left kidney, raising the possibility of pyelonephritis. 7. There is a question of a very subtle nodule  along the peritoneum of the right pelvis. After resolution of patient's acute symptoms, follow-up standard abdomen and pelvis CT with oral and intravenous contrast recommended to further evaluate. 8. 4 mm left upper lobe pulmonary nodule. No follow-up needed if patient is low-risk.This recommendation follows the consensus statement: Guidelines for Management of Incidental Pulmonary Nodules Detected on CT Images: From the Fleischner Society 2017; Radiology 2017; 284:228-243. 9. Emphysema (ICD10-J43.9).    ASSESSMENT  Ms. Skogstad is a 47 y.o. female with a pmh significant for anxiety, MDD, migraines, prior H. pylori infection, perforated gastric ulcer (status post Phillip Heal patch).  The patient is seen today for evaluation and management of:  1. Gastric ulcer with perforation, unspecified chronicity ([place])   2. History of Helicobacter pylori infection   3. Colon cancer screening   4. History of diarrhea    The patient is hemodynamically and clinically stable.  2023 was a difficult year for her and her family.  Thankfully she has done quite well since her perforated gastric ulcer.  An upper endoscopy is recommended for evaluation of her upper GI tract and her previous esophageal findings on CT.  She should continue her PPI at least once daily.  With a history of previous H. pylori, not sure what to make of the antibody assessment from the blood work back in September but she states she has had previous stool antigen testing in the past.  Will plan to take biopsies at the time of her endoscopy.  The patient states that she has had hemorrhoids that have had bleeding at times.  She is due for colon cancer screening.  Will evaluate her hemorrhoids at that point (she deferred DRE today) and decide if internal versus external versus combined  therapies may be required.  She will continue on her high-fiber diet for now.  She will use MiraLAX as needed to keep her bowels soft.  The risks and benefits of endoscopic evaluation were discussed with the patient; these include but are not limited to the risk of perforation, infection, bleeding, missed lesions, lack of diagnosis, severe illness requiring hospitalization, as well as anesthesia and sedation related illnesses.  The patient and/or family is agreeable to proceed.  All patient questions were answered to the best of my ability, and the patient agrees to the aforementioned plan of action with follow-up as indicated.   PLAN  Continue PPI daily May use high-fiber diet May use MiraLAX daily as needed Recommend FiberCon or fiber supplementation daily Endoscopy to evaluate previous patching and rule out H. pylori with biopsies to be scheduled Colon cancer screening to be scheduled Will consider updated CT scan to ensure all findings and abdomen have improved after our endoscopic evaluation   Orders Placed This Encounter  Procedures   Ambulatory referral to Gastroenterology    New Prescriptions   PEG-KCL-NACL-NASULF-NA ASC-C (PLENVU) 140 G SOLR    Take 1 kit by mouth as directed.   PREDNISONE (DELTASONE) 20 MG TABLET    Take 2 tablets (40 mg total) by mouth daily with breakfast for 5 days.    Planned Follow Up No follow-ups on file.   Total Time in Face-to-Face and in Coordination of Care for patient including independent/personal interpretation/review of prior testing, medical history, examination, medication adjustment, communicating results with the patient directly, and documentation within the EHR is 45 minutes.   Justice Britain, MD [facility] Office # [id number]

## 2022-04-03 NOTE — ED Triage Notes (Signed)
Right shoulder pain since last night, no known injury.  States picked up granddaughter yesterday and that started the pain.  Was seen at another urgent care yesterday that did not have X-ray capability.

## 2022-04-03 NOTE — ED Provider Notes (Signed)
[facility]    CSN: [id number] Arrival date & time: 04/03/22  1043      History   Chief Complaint No chief complaint on file.   HPI Kimberly Terrell is a 47 y.o. female.   The history is provided by the patient and the spouse.   Patient presents for complaints of right shoulder pain that started over the last 4 hours.  Patient states that she went to pick up her granddaughter and the pain began at that time.  She states pain radiates down into the right arm.  Patient denies numbness or tingling.  She reports that she does have a history of arthritis.  She has been using Voltaren gel along with heat and taking Tylenol for her symptoms.  She did go to another urgent care, but they were unable to perform imaging.  Patient is currently under the care for orthopedics for her foot and right hip.  Past Medical History:  Diagnosis Date   Anxiety    Arthritis    Asthma    Bursitis    Right Hip and Right Knee   Depression    H. pylori infection    s/p triple therapy x 2   Migraines     Patient Active Problem List   Diagnosis Date Noted   Insomnia 03/09/2022   Pruritic dermatitis 02/04/2022   Acute heart failure (HCC) 01/04/2022   Thrombocytosis 01/04/2022   Demand ischemia    Perforated viscus 12/04/2021   Gastric ulcer 12/04/2021   Morton's neuroma of right foot 09/23/2021   Bunion of left foot 09/23/2021   Controlled substance agreement broken 04/09/2020   Benzodiazepine dependence (HCC) 04/09/2020   Shoulder pain, bilateral 02/27/2020   Asthma 10/30/2019   Chronic hip pain 10/30/2019   TOA (tubo-ovarian abscess) 07/04/2019   IUD infection (HCC) 07/04/2019   Gastroesophageal reflux disease without esophagitis 05/04/2019   H. pylori infection 03/28/2019   Abnormal CT of the abdomen 03/28/2019   Chronic daily headache 10/12/2017   Allergic rhinitis 07/25/2017   Depression, recurrent (HCC) 04/04/2017   Migraine 04/04/2017   Bursitis of right hip 04/04/2017    GAD (generalized anxiety disorder) 04/04/2017   DUB (dysfunctional uterine bleeding) 04/04/2017    Past Surgical History:  Procedure Laterality Date   COSMETIC SURGERY  2008   breast implants    LAPAROTOMY N/A 12/04/2021   Procedure: EXPLORATORY LAPAROTOMY;  Surgeon: Harriette Bouillon, MD;  Location: [facility];  Service: General;  Laterality: N/A;    OB History     Gravida  2   Para  1   Term  1   Preterm      AB  1   Living  1      SAB  1   IAB      Ectopic      Multiple      Live Births  1            Home Medications    Prior to Admission medications   Medication Sig Start Date End Date Taking? Authorizing Provider  predniSONE (DELTASONE) 20 MG tablet Take 2 tablets (40 mg total) by mouth daily with breakfast for 5 days. 04/03/22 04/08/22 Yes Iman Reinertsen-Warren, Sadie Haber, NP  acetaminophen (TYLENOL) 500 MG tablet Take 2 tablets (1,000 mg total) by mouth every 6 (six) hours as needed. 12/15/21   Barnetta Chapel, PA-C  Acetaminophen-guaiFENesin [facility] COLD & FLU) 325-200 MG CAPS Take 2 tablets by mouth daily as needed (cold symptoms).  [provider]  albuterol (VENTOLIN HFA) 108 (90 Base) MCG/ACT inhaler Inhale 2 puffs into the lungs every 6 (six) hours as needed for wheezing or shortness of breath. 10/22/20   Evelina Dun A, FNP  busPIRone (BUSPAR) 7.5 MG tablet Take 2 tablets (15 mg total) by mouth 2 (two) times daily. You may increase to 15 mg twice daily after 1 week if not effective enough. 03/15/22 03/15/23  Romana Juniper, MD  diclofenac sodium (VOLTAREN) 1 % GEL Apply 2 g topically 4 (four) times daily. Patient taking differently: Apply 2 g topically 4 (four) times daily as needed (pain). 06/27/18   Hassell Done Mary-Margaret, FNP  escitalopram (LEXAPRO) 10 MG tablet TAKE 2 TABLETS BY MOUTH EVERY DAY 03/01/22   Iona Coach, MD  furosemide (LASIX) 40 MG tablet Take 1 tablet (40 mg total) by mouth daily. 01/27/22   D[facility], Tami Lin, PA   hydrOXYzine (ATARAX) 10 MG tablet TAKE 1 TABLET BY MOUTH THREE TIMES A DAY AS NEEDED 03/30/22   Romana Juniper, MD  Melatonin 10 MG CAPS Take 20 mg by mouth at bedtime.    [provider]  metoprolol succinate (TOPROL-XL) 25 MG 24 hr tablet Take 1 tablet (25 mg total) by mouth daily. 01/06/22   Josue Hector, MD  pantoprazole (PROTONIX) 40 MG tablet Take 1 tablet (40 mg total) by mouth daily. 03/05/22   Johny Blamer, DO  PEG-KCl-NaCl-NaSulf-Na Asc-C (PLENVU) 140 g SOLR Take 1 kit by mouth as directed. 04/02/22   Mansouraty, Telford Nab., MD  potassium chloride (KLOR-CON) 10 MEQ tablet Take 1 tablet (10 mEq total) by mouth daily. 01/27/22   Duke, Tami Lin, PA  traZODone (DESYREL) 100 MG tablet TAKE 1 TABLET BY MOUTH EVERYDAY AT BEDTIME 03/30/22   Romana Juniper, MD    Family History Family History  Problem Relation Age of Onset   Depression Mother    Depression Daughter    Colon cancer Neg Hx    Esophageal cancer Neg Hx    Gastric cancer Neg Hx    Inflammatory bowel disease Neg Hx    Liver disease Neg Hx    Pancreatic cancer Neg Hx    Rectal cancer Neg Hx    Stomach cancer Neg Hx     Social History Social History   Tobacco Use   Smoking status: Former    Packs/day: 1.00    Types: Cigarettes    Quit date: 12/03/2021    Years since quitting: 0.3   Smokeless tobacco: Never   Tobacco comments:    Trying   Vaping Use   Vaping Use: Never used  Substance Use Topics   Alcohol use: No   Drug use: No     Allergies   Imitrex [sumatriptan], Tape, and Aspirin   Review of Systems Review of Systems Per HPI  Physical Exam Triage Vital Signs ED Triage Vitals  Enc Vitals Group     BP 04/03/22 1110 130/73     Pulse Rate 04/03/22 1110 77     Resp 04/03/22 1110 18     Temp 04/03/22 1110 97.6 F (36.4 C)     Temp Source 04/03/22 1110 Oral     SpO2 04/03/22 1110 98 %     Weight --      Height --      Head Circumference --      Peak Flow --       Pain Score 04/03/22 1111 9     Pain Loc --  Pain Edu? --      Excl. in GC? --    No data found.  Updated Vital Signs BP 130/73 (BP Location: Right Arm)   Pulse 77   Temp 97.6 F (36.4 C) (Oral)   Resp 18   LMP 03/03/2022   SpO2 98%   Visual Acuity Right Eye Distance:   Left Eye Distance:   Bilateral Distance:    Right Eye Near:   Left Eye Near:    Bilateral Near:     Physical Exam Vitals and nursing note reviewed.  Eyes:     Extraocular Movements: Extraocular movements intact.     Pupils: Pupils are equal, round, and reactive to light.  Cardiovascular:     Rate and Rhythm: Normal rate.     Pulses: Normal pulses.     Heart sounds: Normal heart sounds.  Pulmonary:     Effort: Pulmonary effort is normal. No respiratory distress.     Breath sounds: Normal breath sounds. No stridor. No wheezing, rhonchi or rales.  Abdominal:     General: Bowel sounds are normal.     Palpations: Abdomen is soft.  Musculoskeletal:     Right shoulder: Tenderness (Tenderness noted at the glenohumeral joint) present. No swelling, deformity or crepitus. Decreased range of motion. Normal strength. Normal pulse.     Cervical back: Normal range of motion.  Lymphadenopathy:     Cervical: No cervical adenopathy.  Neurological:     General: No focal deficit present.     Mental Status: She is oriented to person, place, and time.  Psychiatric:        Mood and Affect: Mood normal.        Behavior: Behavior normal.      UC Treatments / Results  Labs (all labs ordered are listed, but only abnormal results are displayed) Labs Reviewed - No data to display  EKG   Radiology DG Shoulder Right  Result Date: 04/03/2022 CLINICAL DATA:  Right shoulder pain. EXAM: RIGHT SHOULDER - 2+ VIEW COMPARISON:  None Available. FINDINGS: There is no evidence of fracture or dislocation. There is no evidence of arthropathy or other focal bone abnormality. Soft tissues are unremarkable. IMPRESSION: Negative.  Electronically Signed   By: Gerome Sam III M.D.   On: 04/03/2022 11:44    Procedures Procedures (including critical care time)  Medications Ordered in UC Medications  dexamethasone (DECADRON) injection 10 mg (10 mg Intramuscular Given 04/03/22 1202)  methylPREDNISolone sodium succinate (SOLU-MEDROL) 125 mg/2 mL injection 60 mg (60 mg Intramuscular Given 04/03/22 1204)    Initial Impression / Assessment and Plan / UC Course  I have reviewed the triage vital signs and the nursing notes.  Pertinent labs & imaging results that were available during my care of the patient were reviewed by me and considered in my medical decision making (see chart for details).  The patient is well-appearing, she appears uncomfortable due to right shoulder pain, vital signs are otherwise stable.  X-ray is negative for fracture or dislocation.  Right shoulder pain exacerbated by picking up her granddaughter.  Patient reports history of arthritis, but not indicated on x-ray.  Dexamethasone 10 mg IM and Solu-Medrol 2 mg IM administered for pain and inflammation.  Will start patient on prednisone 40 mg for the next 5 days.  Supportive care recommendations were provided to the patient to include use of Tylenol, gentle stretching and range of motion exercises, and ice or heat as needed for pain or discomfort.  Recommend patient follow-up  with orthopedics for further evaluation.  Patient verbalizes understanding.  All questions were answered.  Patient is stable for discharge.   Final Clinical Impressions(s) / UC Diagnoses   Final diagnoses:  Right shoulder pain, unspecified chronicity     Discharge Instructions      The x-ray is negative for fracture or dislocation. Take medication as prescribed. May apply ice or heat as needed.  Apply ice for pain or swelling, heat for spasm or stiffness.  Apply for 20 minutes, remove for 1 hour, then repeat is much as possible. Gentle range of motion exercises to help improve  mobility. May take over-the-counter Tylenol, recommend Tylenol arthritis strength 650 mg tablets for pain.  May also continue use of Voltaren gel. Recommend following up with orthopedics for further evaluation if symptoms do not improve. Follow-up as needed.     ED Prescriptions     Medication Sig Dispense Auth. Provider   predniSONE (DELTASONE) 20 MG tablet Take 2 tablets (40 mg total) by mouth daily with breakfast for 5 days. 10 tablet Jamiracle Avants-Warren, Sadie Haber, NP      PDMP not reviewed this encounter.   Abran Cantor, NP 04/03/22 1205

## 2022-04-03 NOTE — Discharge Instructions (Addendum)
The x-ray is negative for fracture or dislocation. Take medication as prescribed. May apply ice or heat as needed.  Apply ice for pain or swelling, heat for spasm or stiffness.  Apply for 20 minutes, remove for 1 hour, then repeat is much as possible. Gentle range of motion exercises to help improve mobility. May take over-the-counter Tylenol, recommend Tylenol arthritis strength 650 mg tablets for pain.  May also continue use of Voltaren gel. Recommend following up with orthopedics for further evaluation if symptoms do not improve. Follow-up as needed.

## 2022-04-05 ENCOUNTER — Other Ambulatory Visit: Payer: Self-pay | Admitting: Family

## 2022-04-05 ENCOUNTER — Other Ambulatory Visit: Payer: Self-pay | Admitting: Cardiovascular Disease

## 2022-04-06 DIAGNOSIS — Z1211 Encounter for screening for malignant neoplasm of colon: Secondary | ICD-10-CM | POA: Insufficient documentation

## 2022-04-06 DIAGNOSIS — Z87898 Personal history of other specified conditions: Secondary | ICD-10-CM | POA: Insufficient documentation

## 2022-04-06 DIAGNOSIS — Z8619 Personal history of other infectious and parasitic diseases: Secondary | ICD-10-CM | POA: Insufficient documentation

## 2022-04-06 DIAGNOSIS — Z0000 Encounter for general adult medical examination without abnormal findings: Secondary | ICD-10-CM | POA: Insufficient documentation

## 2022-04-06 DIAGNOSIS — K649 Unspecified hemorrhoids: Secondary | ICD-10-CM | POA: Insufficient documentation

## 2022-04-15 NOTE — Progress Notes (Signed)
Cardiology Office Note:    Date:  04/20/2022   ID:  Kimberly Terrell. Kimberly, Terrell Dec 04, 1975, MRN [id number]  PCP:  Romana Juniper, [place] Providers Cardiologist:  Jenkins Rouge, MD { Referring MD: Ledell Noss, [facility]   Chief Complaint  Patient presents with   Follow-up    DOE    History of Present Illness:    Kimberly Terrell. Gault is a 47 y.o. female with a hx of anxiety, depression, sinus tachcyardia and H pylori x 2. She was recently hospitalized for peritonitis with perforated peptic ulcer requiring ex lap 12/04/21 - she had a perforated distal body posterior wall stomach ulcer with 1.5 L gastric contents in abdominal cavity and peritonitis. Cardiology consulted for sinus tachcyadia and elevated troponin. Initial echo showed LVEF 40-45% with distal septal, apical, and inferior basal hypokinesis. High suspicion for stress-induced cardiomyopathy. Repeat limited echo 12/14/21 showed LVEF 55-60% with no RWMA. She underwent outpatient CT coronary which showed a coronary calcium score of zero and no evidence of CAD. She is not on ASA due to no CAD and hx of PUD. Elevated troponin felt demand ischemia. ASCVD < 5%, statin was not added for LDL 82. GDMT titrated to include low dose entresto and toprol. [name] was not continued outpatient given recovery of EF.  I saw her in clinic 01/27/2022 for hospital follow-up.  She reported shortness of breath that was worse over the last 2 days.  However this was complicated by a recent 3-day cruise in which she ate increased sodium.  After review of her echocardiogram and CCTA, I suspected her shortness of breath was likely related to increased sodium intake and started a short course of Lasix. Repeat echo showed LVEF 55% , moderate MR.   I saw her in follow-up 01/27/2022 and she reported constant shortness of breath and felt like something was sitting on her chest.  Of note, they just returned from a 3-day cruise and likely had increased  sodium intake.  BNP was 152 and I started a 5-day course of Lasix.  She presents today for follow-up. She took lasix with moderate improvement while holding lopressor but her heart rate increased. She is now back on BB and no lasix.   She smoked for years and quit in Sept 2023. She has underlying asthma and has a PRN inhaler. Sounds like she has appropriate chronotropic competence. He describes DOE and occasional lip tingling when breathing fast. I suspect a component of obstructive disease and will refer to pulmonology.   Past Medical History:  Diagnosis Date   Anxiety    Arthritis    Asthma    Bursitis    Right Hip and Right Knee   Depression    H. pylori infection    s/p triple therapy x 2   Migraines     Past Surgical History:  Procedure Laterality Date   COSMETIC SURGERY  2008   breast implants    LAPAROTOMY N/A 12/04/2021   Procedure: EXPLORATORY LAPAROTOMY;  Surgeon: Erroll Luna, MD;  Location: [facility];  Service: General;  Laterality: N/A;    Current Medications: Current Meds  Medication Sig   acetaminophen (TYLENOL) 500 MG tablet Take 2 tablets (1,000 mg total) by mouth every 6 (six) hours as needed.   Acetaminophen-guaiFENesin (MUCINEX COLD & FLU) 325-200 MG CAPS Take 2 tablets by mouth daily as needed (cold symptoms).   albuterol (VENTOLIN HFA) 108 (90 Base) MCG/ACT inhaler Inhale 2 puffs into the lungs every 6 (six)  hours as needed for wheezing or shortness of breath.   busPIRone (BUSPAR) 7.5 MG tablet Take 2 tablets (15 mg total) by mouth 2 (two) times daily. You may increase to 15 mg twice daily after 1 week if not effective enough.   diclofenac sodium (VOLTAREN) 1 % GEL Apply 2 g topically 4 (four) times daily. (Patient taking differently: Apply 2 g topically 4 (four) times daily as needed (pain).)   escitalopram (LEXAPRO) 10 MG tablet TAKE 2 TABLETS BY MOUTH EVERY DAY   furosemide (LASIX) 40 MG tablet Take 1 tablet (40 mg total) by mouth daily.   hydrOXYzine  (ATARAX) 10 MG tablet TAKE 1 TABLET BY MOUTH THREE TIMES A DAY AS NEEDED   Melatonin 10 MG CAPS Take 20 mg by mouth at bedtime.   metoprolol succinate (TOPROL-XL) 25 MG 24 hr tablet TAKE 1 TABLET (25 MG TOTAL) BY MOUTH DAILY.   pantoprazole (PROTONIX) 40 MG tablet Take 1 tablet (40 mg total) by mouth daily.   PEG-KCl-NaCl-NaSulf-Na Asc-C (PLENVU) 140 g SOLR Take 1 kit by mouth as directed.   potassium chloride (KLOR-CON) 10 MEQ tablet Take 1 tablet (10 mEq total) by mouth daily.   traZODone (DESYREL) 100 MG tablet TAKE 1 TABLET BY MOUTH EVERYDAY AT BEDTIME     Allergies:   Imitrex [sumatriptan], Tape, and Aspirin   Social History   Socioeconomic History   Marital status: Married    Spouse name: Charlen Bakula   Number of children: 1   Years of education: Not on file   Highest education level: Not on file  Occupational History   Occupation: [employer]  Tobacco Use   Smoking status: Former    Packs/day: 1.00    Types: Cigarettes    Quit date: 12/03/2021    Years since quitting: 0.3   Smokeless tobacco: Never   Tobacco comments:    Trying   Vaping Use   Vaping Use: Never used  Substance and Sexual Activity   Alcohol use: No   Drug use: No   Sexual activity: Yes    Birth control/protection: I.U.D., Surgical    Comment: husband has had a vasectomy  Other Topics Concern   Not on file  Social History Narrative   Right handed    Lives with P[place]ah Werts   Caffeine use: coffee-daily   Social Determinants of Health   Financial Resource Strain: Low Risk  (07/10/2019)   Overall Financial Resource Strain (CARDIA)    Difficulty of Paying Living Expenses: Not hard at all  Food Insecurity: No Food Insecurity (12/11/2021)   Hunger Vital Sign    Worried About Running Out of Food in the Last Year: Never true    Ran Out of Food in the Last Year: Never true  Transportation Needs: No Transportation Needs (12/11/2021)   PRAPARE - Administrator, Civil Service (Medical): No     Lack of Transportation (Non-Medical): No  Physical Activity: Sufficiently Active (07/10/2019)   Exercise Vital Sign    Days of Exercise per Week: 3 days    Minutes of Exercise per Session: 60 min  Stress: No Stress Concern Present (07/10/2019)   [employer] of Occupational Health - Occupational Stress Questionnaire    Feeling of Stress : Not at all  Social Connections: Moderately Isolated (07/10/2019)   Social Connection and Isolation Panel [NHANES]    Frequency of Communication with Friends and Family: Once a week    Frequency of Social Gatherings with Friends and Family: Once  a week    Attends Religious Services: 1 to 4 times per year    Active Member of Clubs or Organizations: No    Attends Archivist Meetings: Never    Marital Status: Married     Family History: The patient's family history includes Depression in her daughter and mother. There is no history of Colon cancer, Esophageal cancer, Gastric cancer, Inflammatory bowel disease, Liver disease, Pancreatic cancer, Rectal cancer, or Stomach cancer.  ROS:   Please see the history of present illness.     All other systems reviewed and are negative.  EKGs/Labs/Other Studies Reviewed:    The following studies were reviewed today:  Echo 02/03/22: 1. Left ventricular ejection fraction by 3D volume is 55 %. The left  ventricle has normal function. The left ventricle has no regional wall  motion abnormalities. Left ventricular diastolic parameters are  indeterminate.   2. Right ventricular systolic function is normal. The right ventricular  size is normal. There is normal pulmonary artery systolic pressure.   3. The mitral valve is normal in structure. Moderate mitral valve  regurgitation. No evidence of mitral stenosis.   4. The aortic valve is normal in structure. Aortic valve regurgitation is  not visualized. No aortic stenosis is present.   5. The inferior vena cava is normal in size with greater than 50%   respiratory variability, suggesting right atrial pressure of 3 mmHg.   EKG:  EKG is  ordered today.  The ekg ordered today demonstrates sinus rhythm HR 74  Recent Labs: 12/05/2021: ALT 26 12/15/2021: Magnesium 1.9 01/27/2022: BNP 152.1; BUN 7; Creatinine, Ser 0.58; Hemoglobin 9.5; Platelets 295; Potassium 4.1; Sodium 138  Recent Lipid Panel    Component Value Date/Time   CHOL 133 12/12/2021 0233   CHOL 184 05/04/2019 1058   TRIG 171 (H) 12/12/2021 0233   HDL 17 (L) 12/12/2021 0233   HDL 49 05/04/2019 1058   CHOLHDL 7.8 12/12/2021 0233   VLDL 34 12/12/2021 0233   LDLCALC 82 12/12/2021 0233   LDLCALC 120 (H) 05/04/2019 1058     Risk Assessment/Calculations:                Physical Exam:    VS:  BP (!) 96/58 (BP Location: Left Arm, Patient Position: Sitting, Cuff Size: Normal)   Pulse 74   Ht 5\' 4"  (1.626 m)   Wt 152 lb 12.8 oz (69.3 kg)   SpO2 97%   BMI 26.23 kg/m     Wt Readings from Last 3 Encounters:  04/20/22 152 lb 12.8 oz (69.3 kg)  04/02/22 147 lb (66.7 kg)  03/05/22 146 lb (66.2 kg)     GEN:  Well nourished, well developed in no acute distress HEENT: Normal NECK: No JVD; No carotid bruits LYMPHATICS: No lymphadenopathy CARDIAC: RRR, no murmurs, rubs, gallops RESPIRATORY:  Clear to auscultation without rales, wheezing or rhonchi  ABDOMEN: Soft, non-tender, non-distended MUSCULOSKELETAL:  No edema; No deformity  SKIN: Warm and dry NEUROLOGIC:  Alert and oriented x 3 PSYCHIATRIC:  Normal affect   ASSESSMENT:    1. Dyspnea, unspecified type   2. Stress-induced cardiomyopathy   3. Sinus tachycardia   4. NSVT (nonsustained ventricular tachycardia) ([place])   5. Hyperlipidemia with target LDL less than 70   6. DOE (dyspnea on exertion)   7. Mild persistent asthma without complication   8. Former smoker   42. Mitral valve insufficiency, unspecified etiology    PLAN:    In order of problems listed  above:  Dyspnea at rest Orthopnea Suspected this was  likely due to increased salt on cruise, resolved with 5-day course of Lasix. Continues to have DOE Refer to pulmonology for treatment of asthma and possibly obstructive pulmonary disease given smoking history   Presumed stress-induced cardiomyopathy No evidence of CAD on coronary CTA Treated with 24-26 mg entresto BID, 25 mg toprol in hospital, entresto was not continued OP, SGLT2i not added given quick recovery Not volume up on exam   Moderate MR Will repeat an echo in 6 months   NSVT Sinus tachycardia Baseline HR in the 90s with occasional PVCs Continue BB   Hyperlipidemia 12/12/2021: Cholesterol 133; HDL 17; LDL Cholesterol 82; Triglycerides 171; VLDL 34 No statin given CCTA results   DOE Significant smoking history Hx of asthma Has PRN inhaler Given tachycardia, may consider xopenex Will likely need a scheduled inhaler at this point Referral to pulmonology When lips tingle, take slow deep breaths Obtain pulse ox for home use           Medication Adjustments/Labs and Tests Ordered: Current medicines are reviewed at length with the patient today.  Concerns regarding medicines are outlined above.  Orders Placed This Encounter  Procedures   Ambulatory referral to Pulmonology   ECHOCARDIOGRAM COMPLETE   No orders of the defined types were placed in this encounter.   Patient Instructions  Medication Instructions:  Your physician recommends that you continue on your current medications as directed. Please refer to the Current Medication list given to you today.  *If you need a refill on your cardiac medications before your next appointment, please call your pharmacy*   Lab Work: NONE ordered at this time of appointment   If you have labs (blood work) drawn today and your tests are completely normal, you will receive your results only by: MyChart Message (if you have MyChart) OR A paper copy in the mail If you have any lab test that is abnormal or we need to  change your treatment, we will call you to review the results.   Testing/Procedures: Your physician has requested that you have an echocardiogram in 6 months. Echocardiography is a painless test that uses sound waves to create images of your heart. It provides your doctor with information about the size and shape of your heart and how well your heart's chambers and valves are working. This procedure takes approximately one hour. There are no restrictions for this procedure. Please do NOT wear cologne, perfume, aftershave, or lotions (deodorant is allowed). Please arrive 15 minutes prior to your appointment time.   Follow-Up: At [facility], you and your health needs are our priority.  As part of our continuing mission to provide you with exceptional heart care, we have created designated Provider Care Teams.  These Care Teams include your primary Cardiologist (physician) and Advanced Practice Providers (APPs -  Physician Assistants and Nurse Practitioners) who all work together to provide you with the care you need, when you need it.  We recommend signing up for the patient portal called "MyChart".  Sign up information is provided on this After Visit Summary.  MyChart is used to connect with patients for Virtual Visits (Telemedicine).  Patients are able to view lab/test results, encounter notes, upcoming appointments, etc.  Non-urgent messages can be sent to your provider as well.   To learn more about what you can do with MyChart, go to [email or web address].    Your next appointment:   6 month(s)  Provider:  Micah Flesher, PA-C        Other Instructions Referral sent to Pulmonology     Signed, Marcelino Duster, [place]  04/20/2022 11:12 AM    [facility]

## 2022-04-20 ENCOUNTER — Ambulatory Visit: Attending: Physician Assistant | Admitting: Physician Assistant

## 2022-04-20 ENCOUNTER — Encounter: Payer: Self-pay | Admitting: Physician Assistant

## 2022-04-20 VITALS — BP 96/58 | HR 74 | Ht 64.0 in | Wt 152.8 lb

## 2022-04-20 DIAGNOSIS — I340 Nonrheumatic mitral (valve) insufficiency: Secondary | ICD-10-CM

## 2022-04-20 DIAGNOSIS — I5181 Takotsubo syndrome: Secondary | ICD-10-CM | POA: Diagnosis not present

## 2022-04-20 DIAGNOSIS — Z87891 Personal history of nicotine dependence: Secondary | ICD-10-CM

## 2022-04-20 DIAGNOSIS — I4729 Other ventricular tachycardia: Secondary | ICD-10-CM | POA: Diagnosis not present

## 2022-04-20 DIAGNOSIS — R000 Tachycardia, unspecified: Secondary | ICD-10-CM | POA: Diagnosis not present

## 2022-04-20 DIAGNOSIS — R0609 Other forms of dyspnea: Secondary | ICD-10-CM

## 2022-04-20 DIAGNOSIS — E785 Hyperlipidemia, unspecified: Secondary | ICD-10-CM

## 2022-04-20 DIAGNOSIS — R0600 Dyspnea, unspecified: Secondary | ICD-10-CM

## 2022-04-20 DIAGNOSIS — J4530 Mild persistent asthma, uncomplicated: Secondary | ICD-10-CM

## 2022-04-20 NOTE — Patient Instructions (Addendum)
Medication Instructions:  Your physician recommends that you continue on your current medications as directed. Please refer to the Current Medication list given to you today.  *If you need a refill on your cardiac medications before your next appointment, please call your pharmacy*   Lab Work: NONE ordered at this time of appointment   If you have labs (blood work) drawn today and your tests are completely normal, you will receive your results only by: [place] (if you have MyChart) OR A paper copy in the mail If you have any lab test that is abnormal or we need to change your treatment, we will call you to review the results.   Testing/Procedures: Your physician has requested that you have an echocardiogram in 6 months. Echocardiography is a painless test that uses sound waves to create images of your heart. It provides your doctor with information about the size and shape of your heart and how well your heart's chambers and valves are working. This procedure takes approximately one hour. There are no restrictions for this procedure. Please do NOT wear cologne, perfume, aftershave, or lotions (deodorant is allowed). Please arrive 15 minutes prior to your appointment time.   Follow-Up: At [facility] you and your health needs are our priority.  As part of our continuing mission to provide you with exceptional heart care, we have created designated Provider Care Teams.  These Care Teams include your primary Cardiologist (physician) and Advanced Practice Providers (APPs -  Physician Assistants and Nurse Practitioners) who all work together to provide you with the care you need, when you need it.  We recommend signing up for the patient portal called "MyChart".  Sign up information is provided on this After Visit Summary.  MyChart is used to connect with patients for Virtual Visits (Telemedicine).  Patients are able to view lab/test results, encounter notes, upcoming  appointments, etc.  Non-urgent messages can be sent to your provider as well.   To learn more about what you can do with MyChart, go to [email or web address].    Your next appointment:   6 month(s)  Provider:   Fabian Sharp, PA-C        Other Instructions Referral sent to Pulmonology

## 2022-04-21 NOTE — Addendum Note (Signed)
Addended by: Brantley Persons A on: 04/21/2022 04:29 PM   Modules accepted: Orders

## 2022-05-07 ENCOUNTER — Ambulatory Visit (INDEPENDENT_AMBULATORY_CARE_PROVIDER_SITE_OTHER): Admitting: Student

## 2022-05-07 DIAGNOSIS — G4700 Insomnia, unspecified: Secondary | ICD-10-CM

## 2022-05-07 DIAGNOSIS — F411 Generalized anxiety disorder: Secondary | ICD-10-CM | POA: Diagnosis not present

## 2022-05-07 MED ORDER — RAMELTEON 8 MG PO TABS: 8.0000 mg | ORAL_TABLET | Freq: Every evening | ORAL | 1 refills | Status: DC | PRN

## 2022-05-07 NOTE — Progress Notes (Signed)
  [facility] Internal Medicine Residency Telephone Encounter Continuity Care Appointment  HPI:  This telephone encounter was created for [name] on 05/07/2022 for the following purpose/cc follow-up.   Past Medical History:  Past Medical History:  Diagnosis Date   Anxiety    Arthritis    Asthma    Bursitis    Right Hip and Right Knee   Depression    H. pylori infection    s/p triple therapy x 2   Migraines      ROS:  URI symptoms, insomnia   Assessment / Plan / Recommendations:  Please see A&P under problem oriented charting for assessment of the patient's acute and chronic medical conditions.  As always, pt is advised that if symptoms worsen or new symptoms arise, they should go to an urgent care facility or to to ER for further evaluation.   Consent and Medical Decision Making:  Patient discussed with Dr. Jimmye Norman This is a telephone encounter between [place]. [name] and Johny Blamer on 05/07/2022 for 15. The visit was conducted with the patient located at home and Johny Blamer at [facility]. The patient's identity was confirmed using their DOB and current address. The patient has consented to being evaluated through a telephone encounter and understands the associated risks (an examination cannot be done and the patient may need to come in for an appointment) / benefits (allows the patient to remain at home, decreasing exposure to coronavirus). I personally spent 20 minutes on medical discussion.

## 2022-05-13 ENCOUNTER — Telehealth: Payer: Self-pay | Admitting: Gastroenterology

## 2022-05-13 NOTE — Telephone Encounter (Signed)
I have spoken to the pt and we discussed her diet for the upcoming week in regards to her procedure timing in March.  She can eat what she would like as long as it does not bother her.  The 5 days prior to her procedure instructions have been discussed.  No further questions/concerns at this time.

## 2022-05-13 NOTE — Telephone Encounter (Signed)
PT is going on cruise and scheduled for egd/colon on  3/22 and wants to know what she can and cannot eat. Please advise.

## 2022-05-16 NOTE — Assessment & Plan Note (Signed)
Patient is still having insomnia despite the increase of trazodone to 100 mg nightly.  She was interested in trying any medication so we will try ramelteon.  She continues with good sleep hygiene and consistent bedtime.  She is taking melatonin at 15-20 mg nightly.  We discussed that this may be stimulating at high dose. - Start ramelteon 8 mg nightly for sleep and decrease melatonin to 5-10 mg nightly

## 2022-05-16 NOTE — Assessment & Plan Note (Signed)
Anxiety is stable on Lexapro 20 mg daily and addition of BuSpar 7.5-15 mg twice daily.  Will continue to reevaluate at future visits. - Return in 1 month for reevaluation - Continue Lexapro 20 mg daily and BuSpar 7.5-15 mg twice daily

## 2022-05-18 NOTE — Progress Notes (Signed)
Internal Medicine Clinic Attending  Case discussed with Dr. Nikki Dom  At the time of the visit.  We reviewed the resident's history and exam and pertinent patient test results.  I agree with the assessment, diagnosis, and plan of care documented in the resident's note.

## 2022-05-27 ENCOUNTER — Encounter: Payer: Self-pay | Admitting: Radiology

## 2022-05-29 ENCOUNTER — Other Ambulatory Visit: Payer: Self-pay | Admitting: Student

## 2022-05-29 DIAGNOSIS — G4700 Insomnia, unspecified: Secondary | ICD-10-CM

## 2022-06-01 ENCOUNTER — Ambulatory Visit
Admission: RE | Admit: 2022-06-01 | Discharge: 2022-06-01 | Disposition: A | Discharge status: Home / Self Care | Source: Ambulatory Visit | Attending: Family Medicine | Admitting: Family Medicine

## 2022-06-01 ENCOUNTER — Telehealth: Payer: Self-pay | Admitting: Physician Assistant

## 2022-06-01 VITALS — BP 117/76 | HR 82 | Temp 98.0°F | Resp 20

## 2022-06-01 DIAGNOSIS — J069 Acute upper respiratory infection, unspecified: Secondary | ICD-10-CM | POA: Diagnosis not present

## 2022-06-01 DIAGNOSIS — J4521 Mild intermittent asthma with (acute) exacerbation: Secondary | ICD-10-CM

## 2022-06-01 MED ORDER — PROMETHAZINE-DM 6.25-15 MG/5ML PO SYRP: 5.0000 mL | ORAL_SOLUTION | Freq: Four times a day (QID) | ORAL | 0 refills | Status: DC | PRN

## 2022-06-01 MED ORDER — DEXAMETHASONE SODIUM PHOSPHATE 10 MG/ML IJ SOLN
10.0000 mg | Freq: Once | INTRAMUSCULAR | Status: AC
  Administered 2022-06-01: 10 mg via INTRAMUSCULAR

## 2022-06-01 NOTE — Telephone Encounter (Signed)
Husband stated patient's referral to Pulmonary was closed and he needs the referral re-opened so the patient can schedule with Pulmonary.  Husband wants a call back to confirm the referral has been opened

## 2022-06-01 NOTE — ED Triage Notes (Signed)
Pt reports she has a cough, nasuea, diarrhea, stomach pains from coughing, lightheadedness, and SOB x 4 days

## 2022-06-01 NOTE — Telephone Encounter (Signed)
Spoke to  husband - Husband states he contacted  [facility]af[facility] - for an appointment for his wife.   Husband states the referral has been closed and needs to be reopen from  cardiology  standpoint.  Husband states patient did get an appointment with Dr Silas Flood for 07/05/22, but to referral needs to reopen.  RN informed  husband will need to contact the dept - to have referral  reopen .   RN will contact once information is obtain it may take a few days.   Husband - [name]  is aware voiced understanding

## 2022-06-01 NOTE — ED Provider Notes (Signed)
[facility]    CSN: [id number] Arrival date & time: 06/01/22  1013      History   Chief Complaint Chief Complaint  Patient presents with   Cough    Entered by patient    HPI Kimberly Terrell is a 47 y.o. female.   Patient presenting today with 4-day history of hacking productive cough, sore throat, congestion, diarrhea, nausea, abdominal wall pain from coughing so hard.  Denies fever, chills, body aches, chest pain, shortness of breath, vomiting, diarrhea.  So far trying over-the-counter remedies with no relief.  History of asthma on albuterol as needed   Past Medical History:  Diagnosis Date   Anxiety    Arthritis    Asthma    Bursitis    Right Hip and Right Knee   Depression    H. pylori infection    s/p triple therapy x 2   Migraines     Patient Active Problem List   Diagnosis Date Noted   History of diarrhea 04/06/2022   Colon cancer screening 0000000   History of Helicobacter pylori infection 04/06/2022   Hemorrhoids 04/06/2022   Insomnia 03/09/2022   Pruritic dermatitis 02/04/2022   Acute heart failure ([place]) 01/04/2022   Thrombocytosis 01/04/2022   Demand ischemia    Perforated viscus 12/04/2021   Gastric ulcer 12/04/2021   Morton's neuroma of right foot 09/23/2021   Bunion of left foot 09/23/2021   Controlled substance agreement broken 04/09/2020   Benzodiazepine dependence ([place]) 04/09/2020   Shoulder pain, bilateral 02/27/2020   Asthma 10/30/2019   Chronic hip pain 10/30/2019   TOA (tubo-ovarian abscess) 07/04/2019   IUD infection ([place]) 07/04/2019   Gastroesophageal reflux disease without esophagitis 05/04/2019   H. pylori infection 03/28/2019   Abnormal CT of the abdomen 03/28/2019   Chronic daily headache 10/12/2017   Allergic rhinitis 07/25/2017   Depression, recurrent ([place]) 04/04/2017   Migraine 04/04/2017   Bursitis of right hip 04/04/2017   GAD (generalized anxiety disorder) 04/04/2017   DUB (dysfunctional uterine  bleeding) 04/04/2017    Past Surgical History:  Procedure Laterality Date   COSMETIC SURGERY  2008   breast implants    LAPAROTOMY N/A 12/04/2021   Procedure: EXPLORATORY LAPAROTOMY;  Surgeon: Erroll Luna, MD;  Location: [place];  Service: General;  Laterality: N/A;    OB History     Gravida  2   Para  1   Term  1   Preterm      AB  1   Living  1      SAB  1   IAB      Ectopic      Multiple      Live Births  1            Home Medications    Prior to Admission medications   Medication Sig Start Date End Date Taking? Authorizing Provider  promethazine-dextromethorphan (PROMETHAZINE-DM) 6.25-15 MG/5ML syrup Take 5 mLs by mouth 4 (four) times daily as needed. 06/01/22  Yes Volney American, PA-C  acetaminophen (TYLENOL) 500 MG tablet Take 2 tablets (1,000 mg total) by mouth every 6 (six) hours as needed. 12/15/21   Saverio Danker, PA-C  Acetaminophen-guaiFENesin [facility] COLD & FLU) 325-200 MG CAPS Take 2 tablets by mouth daily as needed (cold symptoms).    [provider]  albuterol (VENTOLIN HFA) 108 (90 Base) MCG/ACT inhaler Inhale 2 puffs into the lungs every 6 (six) hours as needed for wheezing or shortness of breath. 10/22/20  Hawks, Christy A, FNP  busPIRone (BUSPAR) 7.5 MG tablet Take 2 tablets (15 mg total) by mouth 2 (two) times daily. You may increase to 15 mg twice daily after 1 week if not effective enough. 03/15/22 03/15/23  Romana Juniper, MD  diclofenac sodium (VOLTAREN) 1 % GEL Apply 2 g topically 4 (four) times daily. Patient taking differently: Apply 2 g topically 4 (four) times daily as needed (pain). 06/27/18   Hassell Done Mary-Margaret, FNP  escitalopram (LEXAPRO) 10 MG tablet TAKE 2 TABLETS BY MOUTH EVERY DAY 03/01/22   Iona Coach, MD  furosemide (LASIX) 40 MG tablet Take 1 tablet (40 mg total) by mouth daily. 01/27/22   Duke, Tami Lin, PA  hydrOXYzine (ATARAX) 10 MG tablet TAKE 1 TABLET BY MOUTH THREE TIMES A DAY AS  NEEDED 03/30/22   Romana Juniper, MD  Melatonin 10 MG CAPS Take 10 mg by mouth at bedtime.    [provider]  metoprolol succinate (TOPROL-XL) 25 MG 24 hr tablet TAKE 1 TABLET (25 MG TOTAL) BY MOUTH DAILY. 04/05/22   Josue Hector, MD  pantoprazole (PROTONIX) 40 MG tablet Take 1 tablet (40 mg total) by mouth daily. 03/05/22   Johny Blamer, DO  PEG-KCl-NaCl-NaSulf-Na Asc-C (PLENVU) 140 g SOLR Take 1 kit by mouth as directed. 04/02/22   Mansouraty, Telford Nab., MD  potassium chloride (KLOR-CON) 10 MEQ tablet Take 1 tablet (10 mEq total) by mouth daily. 01/27/22   Duke, Tami Lin, PA  ramelteon (ROZEREM) 8 MG tablet Take 1 tablet (8 mg total) by mouth at bedtime as needed for sleep. Take 30 minutes before bedtime. 05/07/22 05/07/23  Johny Blamer, DO  traZODone (DESYREL) 100 MG tablet TAKE 1 TABLET BY MOUTH EVERYDAY AT BEDTIME 03/30/22   Romana Juniper, MD    Family History Family History  Problem Relation Age of Onset   Depression Mother    Depression Daughter    Colon cancer Neg Hx    Esophageal cancer Neg Hx    Gastric cancer Neg Hx    Inflammatory bowel disease Neg Hx    Liver disease Neg Hx    Pancreatic cancer Neg Hx    Rectal cancer Neg Hx    Stomach cancer Neg Hx     Social History Social History   Tobacco Use   Smoking status: Former    Packs/day: 1.00    Types: Cigarettes    Quit date: 12/03/2021    Years since quitting: 0.4   Smokeless tobacco: Never   Tobacco comments:    Trying   Vaping Use   Vaping Use: Never used  Substance Use Topics   Alcohol use: No   Drug use: No     Allergies   Imitrex [sumatriptan], Tape, and Aspirin   Review of Systems Review of Systems PER HPI  Physical Exam Triage Vital Signs ED Triage Vitals  Enc Vitals Group     BP 06/01/22 1038 117/76     Pulse Rate 06/01/22 1038 82     Resp 06/01/22 1038 20     Temp 06/01/22 1038 98 F (36.7 C)     Temp Source 06/01/22 1038 Oral     SpO2 06/01/22 1038 98 %      Weight --      Height --      Head Circumference --      Peak Flow --      Pain Score 06/01/22 1041 5     Pain Loc --      Pain  Edu? --      Excl. in [place]? --    No data found.  Updated Vital Signs BP 117/76 (BP Location: Right Arm)   Pulse 82   Temp 98 F (36.7 C) (Oral)   Resp 20   LMP 06/01/2022 (Exact Date)   SpO2 98%   Visual Acuity Right Eye Distance:   Left Eye Distance:   Bilateral Distance:    Right Eye Near:   Left Eye Near:    Bilateral Near:     Physical Exam Vitals and nursing note reviewed.  Constitutional:      Appearance: Normal appearance.  HENT:     Head: Atraumatic.     Right Ear: Tympanic membrane and external ear normal.     Left Ear: Tympanic membrane and external ear normal.     Nose: Rhinorrhea present.     Mouth/Throat:     Mouth: Mucous membranes are moist.     Pharynx: Posterior oropharyngeal erythema present.  Eyes:     Extraocular Movements: Extraocular movements intact.     Conjunctiva/sclera: Conjunctivae normal.  Cardiovascular:     Rate and Rhythm: Normal rate and regular rhythm.     Heart sounds: Normal heart sounds.  Pulmonary:     Effort: Pulmonary effort is normal.     Breath sounds: Wheezing present. No rales.     Comments: Trace wheezes bilaterally Musculoskeletal:        General: Normal range of motion.     Cervical back: Normal range of motion and neck supple.  Skin:    General: Skin is warm and dry.  Neurological:     Mental Status: She is alert and oriented to person, place, and time.  Psychiatric:        Mood and Affect: Mood normal.        Thought Content: Thought content normal.      UC Treatments / Results  Labs (all labs ordered are listed, but only abnormal results are displayed) Labs Reviewed - No data to display  EKG   Radiology No results found.  Procedures Procedures (including critical care time)  Medications Ordered in UC Medications  dexamethasone (DECADRON) injection 10 mg (10 mg  Intramuscular Given 06/01/22 1112)    Initial Impression / Assessment and Plan / UC Course  I have reviewed the triage vital signs and the nursing notes.  Pertinent labs & imaging results that were available during my care of the patient were reviewed by me and considered in my medical decision making (see chart for details).     Treat with IM Decadron, Phenergan DM, Mucinex and other supportive remedies.  Declines viral testing today.  Return for worsening symptoms.  Final Clinical Impressions(s) / UC Diagnoses   Final diagnoses:  Viral URI with cough  Mild intermittent asthma with acute exacerbation   Discharge Instructions   None    ED Prescriptions     Medication Sig Dispense Auth. Provider   promethazine-dextromethorphan (PROMETHAZINE-DM) 6.25-15 MG/5ML syrup Take 5 mLs by mouth 4 (four) times daily as needed. 100 mL Volney American, [place]      PDMP not reviewed this encounter.   Volney American, [place] 06/01/22 1134

## 2022-06-04 NOTE — Telephone Encounter (Signed)
RN called  husband ,Kimberly Terrell  spoke to  front desk  [place]  -  per scheduling notation - Pulmonary office attempted to contact [facility] without success. Pulmonary office closed this referral.  RN informed Kimberly Terrell that  Pulmonary. Office can reopen  referral if need be.  He tanked Rn and states he will call them.

## 2022-06-10 ENCOUNTER — Encounter: Payer: Self-pay | Admitting: Physician Assistant

## 2022-06-10 ENCOUNTER — Ambulatory Visit (INDEPENDENT_AMBULATORY_CARE_PROVIDER_SITE_OTHER): Admitting: Physician Assistant

## 2022-06-10 ENCOUNTER — Other Ambulatory Visit (INDEPENDENT_AMBULATORY_CARE_PROVIDER_SITE_OTHER)

## 2022-06-10 DIAGNOSIS — M25512 Pain in left shoulder: Secondary | ICD-10-CM

## 2022-06-10 DIAGNOSIS — M79672 Pain in left foot: Secondary | ICD-10-CM

## 2022-06-10 DIAGNOSIS — G5761 Lesion of plantar nerve, right lower limb: Secondary | ICD-10-CM | POA: Diagnosis not present

## 2022-06-10 DIAGNOSIS — M25511 Pain in right shoulder: Secondary | ICD-10-CM | POA: Diagnosis not present

## 2022-06-10 DIAGNOSIS — G8929 Other chronic pain: Secondary | ICD-10-CM | POA: Diagnosis not present

## 2022-06-10 MED ORDER — BUPIVACAINE HCL 0.25 % IJ SOLN
2.0000 mL | INTRAMUSCULAR | Status: AC | PRN
  Administered 2022-06-10: 2 mL via INTRA_ARTICULAR

## 2022-06-10 MED ORDER — METHYLPREDNISOLONE ACETATE 40 MG/ML IJ SUSP
60.0000 mg | INTRAMUSCULAR | Status: AC | PRN
  Administered 2022-06-10: 60 mg via INTRA_ARTICULAR

## 2022-06-10 MED ORDER — LIDOCAINE HCL 1 % IJ SOLN
2.0000 mL | INTRAMUSCULAR | Status: AC | PRN
  Administered 2022-06-10: 2 mL

## 2022-06-10 NOTE — Progress Notes (Signed)
Consult Note   Patient: Kimberly Terrell. [place]             Date of Birth: 1975-10-12           MRN: [id number]             Visit Date: 06/10/2022  Requested by: Romana Juniper, MD [address] [place],  [place] [address] PCP: Romana Juniper, MD  Thank you for asking me to evaluate this patient for Pain of the Left Shoulder  Below are my findings.     Assessment & Plan: Visit Diagnoses:  1. Pain in left foot   2. Morton's neuroma of right foot   3. Chronic pain of both shoulders     Plan: Pleasant 47 year old woman who has been previously seen by Dr. Durward Fortes for a right foot second webspace Morton's neuroma.  He has injected her in the past and she has gotten some relief.  She has had significant medical issues recently with abdominal surgeries secondary to ruptured ulcers.  She says she has had the return of the painful Morton's neuroma.  She is interested in injection but she thinks at this point she would like to have surgery if that is an option.  She also comes in today with a recent complaint of left shoulder pain.  She said she just woke up and her left shoulder hurts with all movement.  She said she had a similar problem with her right shoulder and was given a nonarticular injection of steroid which helped.  She this was done at an urgent care.  With regards to her foot I have referred her for surgical discussion with Dr. Sharol Given.  Her shoulder x-rays do not show any acute findings.  She is significantly painful especially with overhead movement.  I will try an injection of some steroid into the subacromial space and see how she does.  If she does not improve would recommend an MRI  Follow Up Instructions: No follow-ups on file.  Orders: Orders Placed This Encounter  Procedures   XR Shoulder Left   No orders of the defined types were placed in this encounter.    Procedures: Large Joint Inj: L subacromial bursa on 06/10/2022 11:31 AM Indications: diagnostic  evaluation and pain Details: 25 G 1.5 in needle, posterior approach  Arthrogram: No  Medications: 2 mL lidocaine 1 %; 60 mg methylPREDNISolone acetate 40 MG/ML; 2 mL bupivacaine 0.25 % Outcome: tolerated well, no immediate complications Procedure, treatment alternatives, risks and benefits explained, specific risks discussed. Consent was given by the patient.     Clinical Data: No additional findings.   Subjective:  Chief Complaint  Patient presents with   Left Shoulder - Pain     HPI pleasant 47 year old woman with a chief complaint of recurrent painful Morton's neuroma of the second webspace of the right foot.  Also if a recent history of left shoulder pain with decreased motion  Review of Systems  All other systems reviewed and are negative.    Objective: Vital Signs: LMP 06/01/2022 (Exact Date)   Physical Exam Constitutional:      Appearance: Normal appearance.  Pulmonary:     Effort: Pulmonary effort is normal.  Skin:    General: Skin is warm and dry.  Neurological:     Mental Status: She is alert.      Ortho Exam  Right foot she has a strong palpable pulse no swelling no erythema no signs of infection she does have displacement of the  second toe over the second webspace.  She is tender to palpation.  Cannot appreciate a Mulder's click Left shoulder no redness no erythema.  She has forward elevation to 130 degrees and can hold it there briefly but because of pain cannot do this.  No pain with external rotation strength is difficult to assess because of her pain also painful with internal rotation behind the back and positive empty can test she is neurovascular intact no pain in her neck Specialty Comments:  No specialty comments available.  Imaging:  XR Shoulder Left  Result Date: 06/10/2022 Radiographs of the left shoulder demonstrate no evidence of any significant degenerative changes humeral head is well reduced in the glenoid.  No acute fractures noted.     PMFS History: Patient Active Problem List   Diagnosis Date Noted   History of diarrhea 04/06/2022   Colon cancer screening 0000000   History of Helicobacter pylori infection 04/06/2022   Hemorrhoids 04/06/2022   Insomnia 03/09/2022   Pruritic dermatitis 02/04/2022   Acute heart failure ([place]) 01/04/2022   Thrombocytosis 01/04/2022   Demand ischemia    Perforated viscus 12/04/2021   Gastric ulcer 12/04/2021   Morton's neuroma of right foot 09/23/2021   Bunion of left foot 09/23/2021   Controlled substance agreement broken 04/09/2020   Benzodiazepine dependence ([place]) 04/09/2020   Shoulder pain, bilateral 02/27/2020   Asthma 10/30/2019   Chronic hip pain 10/30/2019   TOA (tubo-ovarian abscess) 07/04/2019   IUD infection ([place]) 07/04/2019   Gastroesophageal reflux disease without esophagitis 05/04/2019   H. pylori infection 03/28/2019   Abnormal CT of the abdomen 03/28/2019   Chronic daily headache 10/12/2017   Allergic rhinitis 07/25/2017   Depression, recurrent ([place]) 04/04/2017   Migraine 04/04/2017   Bursitis of right hip 04/04/2017   GAD (generalized anxiety disorder) 04/04/2017   DUB (dysfunctional uterine bleeding) 04/04/2017   Past Medical History:  Diagnosis Date   Anxiety    Arthritis    Asthma    Bursitis    Right Hip and Right Knee   Depression    H. pylori infection    s/p triple therapy x 2   Migraines     Family History  Problem Relation Age of Onset   Depression Mother    Depression Daughter    Colon cancer Neg Hx    Esophageal cancer Neg Hx    Gastric cancer Neg Hx    Inflammatory bowel disease Neg Hx    Liver disease Neg Hx    Pancreatic cancer Neg Hx    Rectal cancer Neg Hx    Stomach cancer Neg Hx    Past Surgical History:  Procedure Laterality Date   COSMETIC SURGERY  2008   breast implants    LAPAROTOMY N/A 12/04/2021   Procedure: EXPLORATORY LAPAROTOMY;  Surgeon: Erroll Luna, MD;  Location: [place];  Service: General;   Laterality: N/A;   Social History   Occupational History   Occupation: [employer]  Tobacco Use   Smoking status: Former    Packs/day: 1    Types: Cigarettes    Quit date: 12/03/2021    Years since quitting: 0.5   Smokeless tobacco: Never   Tobacco comments:    Trying   Vaping Use   Vaping Use: Never used  Substance and Sexual Activity   Alcohol use: No   Drug use: No   Sexual activity: Yes    Birth control/protection: I.U.D., Surgical    Comment: husband has had a vasectomy

## 2022-06-17 ENCOUNTER — Ambulatory Visit: Admitting: Orthopedic Surgery

## 2022-06-18 ENCOUNTER — Encounter: Payer: Self-pay | Admitting: Gastroenterology

## 2022-06-18 ENCOUNTER — Ambulatory Visit (AMBULATORY_SURGERY_CENTER): Admitting: Gastroenterology

## 2022-06-18 VITALS — BP 112/58 | HR 75 | Temp 97.1°F | Resp 19 | Ht 63.0 in | Wt 147.0 lb

## 2022-06-18 DIAGNOSIS — K255 Chronic or unspecified gastric ulcer with perforation: Secondary | ICD-10-CM | POA: Diagnosis not present

## 2022-06-18 DIAGNOSIS — D120 Benign neoplasm of cecum: Secondary | ICD-10-CM | POA: Diagnosis not present

## 2022-06-18 DIAGNOSIS — Z1211 Encounter for screening for malignant neoplasm of colon: Secondary | ICD-10-CM

## 2022-06-18 DIAGNOSIS — K2100 Gastro-esophageal reflux disease with esophagitis, without bleeding: Secondary | ICD-10-CM

## 2022-06-18 DIAGNOSIS — D122 Benign neoplasm of ascending colon: Secondary | ICD-10-CM | POA: Diagnosis not present

## 2022-06-18 DIAGNOSIS — K635 Polyp of colon: Secondary | ICD-10-CM

## 2022-06-18 DIAGNOSIS — K2900 Acute gastritis without bleeding: Secondary | ICD-10-CM

## 2022-06-18 DIAGNOSIS — K319 Disease of stomach and duodenum, unspecified: Secondary | ICD-10-CM

## 2022-06-18 DIAGNOSIS — K449 Diaphragmatic hernia without obstruction or gangrene: Secondary | ICD-10-CM

## 2022-06-18 DIAGNOSIS — K2981 Duodenitis with bleeding: Secondary | ICD-10-CM

## 2022-06-18 DIAGNOSIS — K229 Disease of esophagus, unspecified: Secondary | ICD-10-CM

## 2022-06-18 DIAGNOSIS — K208 Other esophagitis without bleeding: Secondary | ICD-10-CM

## 2022-06-18 MED ORDER — HYDROCORTISONE ACETATE 25 MG RE SUPP: 25.0000 mg | Freq: Every day | RECTAL | 1 refills | Status: DC

## 2022-06-18 MED ORDER — PANTOPRAZOLE SODIUM 40 MG PO TBEC: 40.0000 mg | DELAYED_RELEASE_TABLET | Freq: Two times a day (BID) | ORAL | 3 refills | Status: DC

## 2022-06-18 MED ORDER — SODIUM CHLORIDE 0.9 % IV SOLN: 500.0000 mL | Freq: Once | INTRAVENOUS | Status: DC

## 2022-06-18 NOTE — Progress Notes (Unsigned)
GASTROENTEROLOGY PROCEDURE H&P NOTE   Primary Care Physician: Romana Juniper, MD  HPI: Kimberly Terrell is a 47 y.o. female who presents for EGD/colonoscopy for evaluation of previously perforated gastric ulcer and esophageal thickening and colon cancer screening.  Past Medical History:  Diagnosis Date   Anxiety    Arthritis    Asthma    Bursitis    Right Hip and Right Knee   Depression    H. pylori infection    s/p triple therapy x 2   Migraines    Past Surgical History:  Procedure Laterality Date   COSMETIC SURGERY  2008   breast implants    gastric ulcer surgery     gastric ulcer perforation surgery   LAPAROTOMY N/A 12/04/2021   Procedure: EXPLORATORY LAPAROTOMY;  Surgeon: Erroll Luna, MD;  Location: [place];  Service: General;  Laterality: N/A;   Current Outpatient Medications  Medication Sig Dispense Refill   acetaminophen (TYLENOL) 500 MG tablet Take 2 tablets (1,000 mg total) by mouth every 6 (six) hours as needed. 30 tablet 0   Acetaminophen-guaiFENesin (MUCINEX COLD & FLU) 325-200 MG CAPS Take 2 tablets by mouth daily as needed (cold symptoms).     busPIRone (BUSPAR) 7.5 MG tablet Take 2 tablets (15 mg total) by mouth 2 (two) times daily. You may increase to 15 mg twice daily after 1 week if not effective enough. 360 tablet 3   diclofenac sodium (VOLTAREN) 1 % GEL Apply 2 g topically 4 (four) times daily. (Patient taking differently: Apply 2 g topically 4 (four) times daily as needed (pain).) 300 g 1   escitalopram (LEXAPRO) 10 MG tablet TAKE 2 TABLETS BY MOUTH EVERY DAY 180 tablet 1   Melatonin 10 MG CAPS Take 10 mg by mouth at bedtime.     metoprolol succinate (TOPROL-XL) 25 MG 24 hr tablet TAKE 1 TABLET (25 MG TOTAL) BY MOUTH DAILY. 90 tablet 0   pantoprazole (PROTONIX) 40 MG tablet Take 1 tablet (40 mg total) by mouth daily. 90 tablet 3   ramelteon (ROZEREM) 8 MG tablet TAKE 1 TABLET (8 MG TOTAL) BY MOUTH AT BEDTIME AS NEEDED FOR SLEEP. TAKE 30  MINUTES BEFORE BEDTIME. 90 tablet 1   traZODone (DESYREL) 100 MG tablet TAKE 1 TABLET BY MOUTH EVERYDAY AT BEDTIME 90 tablet 2   albuterol (VENTOLIN HFA) 108 (90 Base) MCG/ACT inhaler Inhale 2 puffs into the lungs every 6 (six) hours as needed for wheezing or shortness of breath. 18 g 0   hydrOXYzine (ATARAX) 10 MG tablet TAKE 1 TABLET BY MOUTH THREE TIMES A DAY AS NEEDED 270 tablet 1   potassium chloride (KLOR-CON) 10 MEQ tablet Take 1 tablet (10 mEq total) by mouth daily. 30 tablet 0   Current Facility-Administered Medications  Medication Dose Route Frequency Provider Last Rate Last Admin   0.9 %  sodium chloride infusion  500 mL Intravenous Once Mansouraty, Telford Nab., MD        Current Outpatient Medications:    acetaminophen (TYLENOL) 500 MG tablet, Take 2 tablets (1,000 mg total) by mouth every 6 (six) hours as needed., Disp: 30 tablet, Rfl: 0   Acetaminophen-guaiFENesin (MUCINEX COLD & FLU) 325-200 MG CAPS, Take 2 tablets by mouth daily as needed (cold symptoms)., Disp: , Rfl:    busPIRone (BUSPAR) 7.5 MG tablet, Take 2 tablets (15 mg total) by mouth 2 (two) times daily. You may increase to 15 mg twice daily after 1 week if not effective enough., Disp: 360 tablet, Rfl:  3   diclofenac sodium (VOLTAREN) 1 % GEL, Apply 2 g topically 4 (four) times daily. (Patient taking differently: Apply 2 g topically 4 (four) times daily as needed (pain).), Disp: 300 g, Rfl: 1   escitalopram (LEXAPRO) 10 MG tablet, TAKE 2 TABLETS BY MOUTH EVERY DAY, Disp: 180 tablet, Rfl: 1   Melatonin 10 MG CAPS, Take 10 mg by mouth at bedtime., Disp: , Rfl:    metoprolol succinate (TOPROL-XL) 25 MG 24 hr tablet, TAKE 1 TABLET (25 MG TOTAL) BY MOUTH DAILY., Disp: 90 tablet, Rfl: 0   pantoprazole (PROTONIX) 40 MG tablet, Take 1 tablet (40 mg total) by mouth daily., Disp: 90 tablet, Rfl: 3   ramelteon (ROZEREM) 8 MG tablet, TAKE 1 TABLET (8 MG TOTAL) BY MOUTH AT BEDTIME AS NEEDED FOR SLEEP. TAKE 30 MINUTES BEFORE BEDTIME.,  Disp: 90 tablet, Rfl: 1   traZODone (DESYREL) 100 MG tablet, TAKE 1 TABLET BY MOUTH EVERYDAY AT BEDTIME, Disp: 90 tablet, Rfl: 2   albuterol (VENTOLIN HFA) 108 (90 Base) MCG/ACT inhaler, Inhale 2 puffs into the lungs every 6 (six) hours as needed for wheezing or shortness of breath., Disp: 18 g, Rfl: 0   hydrOXYzine (ATARAX) 10 MG tablet, TAKE 1 TABLET BY MOUTH THREE TIMES A DAY AS NEEDED, Disp: 270 tablet, Rfl: 1   potassium chloride (KLOR-CON) 10 MEQ tablet, Take 1 tablet (10 mEq total) by mouth daily., Disp: 30 tablet, Rfl: 0  Current Facility-Administered Medications:    0.9 %  sodium chloride infusion, 500 mL, Intravenous, Once, Mansouraty, Telford Nab., MD Allergies  Allergen Reactions   Imitrex [Sumatriptan] Other (See Comments)    "Gave me lockjaw"   Tape Other (See Comments)    The "plastic-like tape causes redness and irritation"   Aspirin Other (See Comments)    Caused chest pain   Family History  Problem Relation Age of Onset   Depression Mother    Depression Daughter    Colon cancer Neg Hx    Esophageal cancer Neg Hx    Gastric cancer Neg Hx    Inflammatory bowel disease Neg Hx    Liver disease Neg Hx    Pancreatic cancer Neg Hx    Rectal cancer Neg Hx    Stomach cancer Neg Hx    Social History   Socioeconomic History   Marital status: Married    Spouse name: Evyenia Guzy   Number of children: 1   Years of education: Not on file   Highest education level: Not on file  Occupational History   Occupation: [employer]  Tobacco Use   Smoking status: Former    Packs/day: 1    Types: Cigarettes    Quit date: 12/03/2021    Years since quitting: 0.5   Smokeless tobacco: Never   Tobacco comments:    Trying   Vaping Use   Vaping Use: Never used  Substance and Sexual Activity   Alcohol use: No   Drug use: No   Sexual activity: Yes    Birth control/protection: I.U.D., Surgical    Comment: husband has had a vasectomy  Other Topics Concern   Not on file  Social  History Narrative   Right handed    Lives with Fuig Kiener   Caffeine use: coffee-daily   Social Determinants of Health   Financial Resource Strain: Low Risk  (07/10/2019)   Overall Financial Resource Strain (CARDIA)    Difficulty of Paying Living Expenses: Not hard at all  Food Insecurity: No Food  Insecurity (12/11/2021)   Hunger Vital Sign    Worried About Running Out of Food in the Last Year: Never true    Ran Out of Food in the Last Year: Never true  Transportation Needs: No Transportation Needs (12/11/2021)   PRAPARE - Hydrologist (Medical): No    Lack of Transportation (Non-Medical): No  Physical Activity: Sufficiently Active (07/10/2019)   Exercise Vital Sign    Days of Exercise per Week: 3 days    Minutes of Exercise per Session: 60 min  Stress: No Stress Concern Present (07/10/2019)   [place]    Feeling of Stress : Not at all  Social Connections: Moderately Isolated (07/10/2019)   Social Connection and Isolation Panel [NHANES]    Frequency of Communication with Friends and Family: Once a week    Frequency of Social Gatherings with Friends and Family: Once a week    Attends Religious Services: 1 to 4 times per year    Active Member of [employer] or Organizations: No    Attends Archivist Meetings: Never    Marital Status: Married  Human resources officer Violence: Not At Risk (12/11/2021)   Humiliation, Afraid, Rape, and Kick questionnaire    Fear of Current or Ex-Partner: No    Emotionally Abused: No    Physically Abused: No    Sexually Abused: No    Physical Exam: Today's Vitals   06/18/22 1258 06/18/22 1303  BP: 126/71   Pulse: 70   Temp: (!) 97.1 F (36.2 C) (!) 97.1 F (36.2 C)  TempSrc: Skin   SpO2: 99%   Weight: 147 lb (66.7 kg)   Height: 5\' 3"  (1.6 m)    Body mass index is 26.04 kg/m. GEN: NAD EYE: Sclerae anicteric ENT: MMM CV: Non-tachycardic GI: Soft,  NT/ND NEURO:  Alert & Oriented x 3  Lab Results: No results for input(s): "WBC", "HGB", "HCT", "PLT" in the last 72 hours. BMET No results for input(s): "NA", "K", "CL", "CO2", "GLUCOSE", "BUN", "CREATININE", "CALCIUM" in the last 72 hours. LFT No results for input(s): "PROT", "ALBUMIN", "AST", "ALT", "ALKPHOS", "BILITOT", "BILIDIR", "IBILI" in the last 72 hours. PT/INR No results for input(s): "LABPROT", "INR" in the last 72 hours.   Impression / Plan: This is a 47 y.o.female who presents for EGD/colonoscopy for evaluation of previously perforated gastric ulcer and esophageal thickening and colon cancer screening.  The risks and benefits of endoscopic evaluation/treatment were discussed with the patient and/or family; these include but are not limited to the risk of perforation, infection, bleeding, missed lesions, lack of diagnosis, severe illness requiring hospitalization, as well as anesthesia and sedation related illnesses.  The patient's history has been reviewed, patient examined, no change in status, and deemed stable for procedure.  The patient and/or family is agreeable to proceed.    Justice Britain, MD [facility] Office # [id number]

## 2022-06-18 NOTE — Op Note (Signed)
[place] Patient Name: Kimberly Terrell Procedure Date: 06/18/2022 1:52 PM MRN: [id number] Endoscopist: Justice Britain , MD, [id number] Age: 47 Referring MD:  Date of Birth: June 13, 1975 Gender: Female Account #: [id number] Procedure:                Colonoscopy Indications:              Screening for colorectal malignant neoplasm Medicines:                Monitored Anesthesia Care Procedure:                Pre-Anesthesia Assessment:                           - Prior to the procedure, a History and Physical                            was performed, and patient medications and                            allergies were reviewed. The patient's tolerance of                            previous anesthesia was also reviewed. The risks                            and benefits of the procedure and the sedation                            options and risks were discussed with the patient.                            All questions were answered, and informed consent                            was obtained. Prior Anticoagulants: The patient has                            taken no anticoagulant or antiplatelet agents. ASA                            Grade Assessment: II - A patient with mild systemic                            disease. After reviewing the risks and benefits,                            the patient was deemed in satisfactory condition to                            undergo the procedure.                           After obtaining informed consent, the colonoscope  was passed under direct vision. Throughout the                            procedure, the patient's blood pressure, pulse, and                            oxygen saturations were monitored continuously. The                            Olympus [id number] Colonoscope was                            introduced through the anus and advanced to the 3                            cm into the  ileum. The colonoscopy was performed                            without difficulty. The patient tolerated the                            procedure. The quality of the bowel preparation was                            good. The terminal ileum, ileocecal valve,                            appendiceal orifice, and rectum were photographed. Scope In: 2:10:29 PM Scope Out: 2:28:28 PM Scope Withdrawal Time: 0 hours 13 minutes 17 seconds  Total Procedure Duration: 0 hours 17 minutes 59 seconds  Findings:                 The digital rectal exam findings include                            hemorrhoids. Pertinent negatives include no                            palpable rectal lesions.                           The terminal ileum and ileocecal valve appeared                            normal.                           Two sessile polyps were found in the ascending                            colon and cecum. The polyps were 8 to 10 mm in                            size. These polyps were removed with a cold snare.  Resection and retrieval were complete.                           Normal mucosa was found in the entire colon                            otherwise.                           Non-bleeding non-thrombosed hemorrhoids were found                            during retroflexion, during perianal exam and                            during digital exam. The hemorrhoids were Grade II                            (internal hemorrhoids that prolapse but reduce                            spontaneously). Complications:            No immediate complications. Estimated Blood Loss:     Estimated blood loss was minimal. Impression:               - Hemorrhoids found on digital rectal exam.                           - The examined portion of the ileum was normal.                           - Two 8 to 10 mm polyps in the ascending colon and                            in the cecum, removed  with a cold snare. Resected                            and retrieved.                           - Normal mucosa in the entire examined colon                            otherwise.                           - Non-bleeding non-thrombosed hemorrhoids. Recommendation:           - The patient will be observed post-procedure,                            until all discharge criteria are met.                           - Discharge patient to home.                           -  Patient has a contact number available for                            emergencies. The signs and symptoms of potential                            delayed complications were discussed with the                            patient. Return to normal activities tomorrow.                            Written discharge instructions were provided to the                            patient.                           - High fiber diet.                           - Use FiberCon 1-2 tablets PO daily.                           - Continue present medications.                           - Await pathology results.                           - Repeat colonoscopy in 3 years for surveillance.                           - The findings and recommendations were discussed                            with the patient.                           - The findings and recommendations were discussed                            with the patient's family. Justice Britain, MD 06/18/2022 2:45:35 PM

## 2022-06-18 NOTE — Progress Notes (Unsigned)
Uneventful anesthetic. Report to pacu rn. Vss. Care resumed by rn. 

## 2022-06-18 NOTE — Progress Notes (Signed)
Called to room to assist during endoscopic procedure.  Patient ID and intended procedure confirmed with present staff. Received instructions for my participation in the procedure from the performing physician.  

## 2022-06-18 NOTE — Patient Instructions (Addendum)
Information on hiatal hernia, gastritis, esophagitis, polyps and hemorrhoids given to you today.  Await pathology results.  Resume previous diet and medications.  Increase PPI for 2 months then may go back to once a dayif symptoms are controlled.  If patient is doing well, may not need to repeat upper endoscopy as she is not having significant heartburn symptoms, otherwise consider repeat EGD to ensure healing.  Repeat colonoscopy in 3 years for surveillance.  Use FiberCon 1-2 tablets each day.   YOU HAD AN ENDOSCOPIC PROCEDURE TODAY AT [facility]:   Refer to the procedure report that was given to you for any specific questions about what was found during the examination.  If the procedure report does not answer your questions, please call your gastroenterologist to clarify.  If you requested that your care partner not be given the details of your procedure findings, then the procedure report has been included in a sealed envelope for you to review at your convenience later.  YOU SHOULD EXPECT: Some feelings of bloating in the abdomen. Passage of more gas than usual.  Walking can help get rid of the air that was put into your GI tract during the procedure and reduce the bloating. If you had a lower endoscopy (such as a colonoscopy or flexible sigmoidoscopy) you may notice spotting of blood in your stool or on the toilet paper. If you underwent a bowel prep for your procedure, you may not have a normal bowel movement for a few days.  Please Note:  You might notice some irritation and congestion in your nose or some drainage.  This is from the oxygen used during your procedure.  There is no need for concern and it should clear up in a day or so.  SYMPTOMS TO REPORT IMMEDIATELY:  Following lower endoscopy (colonoscopy or flexible sigmoidoscopy):  Excessive amounts of blood in the stool  Significant tenderness or worsening of abdominal pains  Swelling of the abdomen that is new,  acute  Fever of 100F or higher  Following upper endoscopy (EGD)  Vomiting of blood or coffee ground material  New chest pain or pain under the shoulder blades  Painful or persistently difficult swallowing  New shortness of breath  Fever of 100F or higher  Black, tarry-looking stools  For urgent or emergent issues, a gastroenterologist can be reached at any hour by calling [phone]. Do not use MyChart messaging for urgent concerns.    DIET:  We do recommend a small meal at first, but then you may proceed to your regular diet.  Drink plenty of fluids but you should avoid alcoholic beverages for 24 hours.  ACTIVITY:  You should plan to take it easy for the rest of today and you should NOT DRIVE or use heavy machinery until tomorrow (because of the sedation medicines used during the test).    FOLLOW UP: Our staff will call the number listed on your records the next business day following your procedure.  We will call around 7:15- 8:00 am to check on you and address any questions or concerns that you may have regarding the information given to you following your procedure. If we do not reach you, we will leave a message.     If any biopsies were taken you will be contacted by phone or by letter within the next 1-3 weeks.  Please call us at [phone] if you have not heard about the biopsies in 3 weeks.    SIGNATURES/CONFIDENTIALITY: You and/or  your care partner have signed paperwork which will be entered into your electronic medical record.  These signatures attest to the fact that that the information above on your After Visit Summary has been reviewed and is understood.  Full responsibility of the confidentiality of this discharge information lies with you and/or your care-partner.anusol

## 2022-06-18 NOTE — Op Note (Signed)
[place] Patient Name: Kimberly Terrell Procedure Date: 06/18/2022 1:53 PM MRN: [id number] Endoscopist: Justice Britain , MD, [id number] Age: 47 Referring MD:  Date of Birth: March 30, 1975 Gender: Female Account #: [id number] Procedure:                Upper GI endoscopy Indications:              Epigastric abdominal pain, Follow-up of gastric                            ulcer with hemorrhage and perforation,                            Postoperative assessment Medicines:                Monitored Anesthesia Care Procedure:                Pre-Anesthesia Assessment:                           - Prior to the procedure, a History and Physical                            was performed, and patient medications and                            allergies were reviewed. The patient's tolerance of                            previous anesthesia was also reviewed. The risks                            and benefits of the procedure and the sedation                            options and risks were discussed with the patient.                            All questions were answered, and informed consent                            was obtained. Prior Anticoagulants: The patient has                            taken no anticoagulant or antiplatelet agents. ASA                            Grade Assessment: II - A patient with mild systemic                            disease. After reviewing the risks and benefits,                            the patient was deemed in satisfactory condition to  undergo the procedure.                           After obtaining informed consent, the endoscope was                            passed under direct vision. Throughout the                            procedure, the patient's blood pressure, pulse, and                            oxygen saturations were monitored continuously. The                            Olympus Scope [id number] was  introduced through the                            mouth, and advanced to the second part of duodenum.                            The upper GI endoscopy was accomplished without                            difficulty. The patient tolerated the procedure. Scope In: Scope Out: Findings:                 No gross lesions were noted in the proximal                            esophagus and in the mid esophagus.                           LA Grade A (one or more mucosal breaks less than 5                            mm, not extending between tops of 2 mucosal folds)                            esophagitis with no bleeding was found in the                            distal esophagus.                           The Z-line was irregular and was found 40 cm from                            the incisors.                           A 2 cm hiatal hernia was present.                           A large scar  was found on the posterior wall of the                            stomach. The scar tissue was healthy in appearance.                           Patchy mildly erythematous mucosa without bleeding                            was found in the entire examined stomach. Biopsies                            were taken with a cold forceps for histology and                            Helicobacter pylori testing.                           No gross lesions were noted in the duodenal bulb,                            in the first portion of the duodenum and in the                            second portion of the duodenum. Biopsies were taken                            with a cold forceps for histology. Complications:            No immediate complications. Estimated Blood Loss:     Estimated blood loss was minimal. Impression:               - No gross lesions in the proximal esophagus and in                            the mid esophagus. LA Grade A esophagitis with no                            bleeding found distally. Z-line  irregular, 40 cm                            from the incisors.                           - 2 cm hiatal hernia.                           - Erythematous mucosa in the stomach. Biopsied.                           - Scar noted in the posterior wall - likely from                            previous ulcer disease and  surgery.                           - No gross lesions in the duodenal bulb, in the                            first portion of the duodenum and in the second                            portion of the duodenum. Biopsied. Recommendation:           - Proceed to scheduled colonoscopy.                           - Observe patient's clinical course.                           - Increase PPI for 2 months then may go back to                            once daily if symptoms are controlled.                           - If patient is doing well, may not need to repeat                            upper endoscopy as she is not having significant                            heartburn symptoms, otherwise to consider repeat                            EGD to ensure healing and no evidence of Barrett's                            esophagus.                           - The findings and recommendations were discussed                            with the patient.                           - The findings and recommendations were discussed                            with the patient's family. Justice Britain, MD 06/18/2022 2:42:30 PM

## 2022-06-21 ENCOUNTER — Telehealth: Payer: Self-pay

## 2022-06-21 NOTE — Telephone Encounter (Signed)
  Follow up Call-     06/18/2022    1:03 PM  Call back number  Post procedure Call Back phone  # [phone]  Permission to leave phone message Yes     Patient questions:  Do you have a fever, pain , or abdominal swelling? No. Pain Score  0 *  Have you tolerated food without any problems? Yes.    Have you been able to return to your normal activities? Yes.    Do you have any questions about your discharge instructions: Diet   No. Medications  No. Follow up visit  No.  Do you have questions or concerns about your Care? No.  Actions: * If pain score is 4 or above: No action needed, pain <4.

## 2022-06-25 ENCOUNTER — Encounter: Payer: Self-pay | Admitting: Gastroenterology

## 2022-06-28 ENCOUNTER — Institutional Professional Consult (permissible substitution) (HOSPITAL_BASED_OUTPATIENT_CLINIC_OR_DEPARTMENT_OTHER): Admitting: Pulmonary Disease

## 2022-06-29 ENCOUNTER — Ambulatory Visit: Admitting: Orthopedic Surgery

## 2022-06-29 DIAGNOSIS — M6701 Short Achilles tendon (acquired), right ankle: Secondary | ICD-10-CM | POA: Diagnosis not present

## 2022-06-29 DIAGNOSIS — M7741 Metatarsalgia, right foot: Secondary | ICD-10-CM

## 2022-07-04 ENCOUNTER — Encounter: Payer: Self-pay | Admitting: Orthopedic Surgery

## 2022-07-04 NOTE — Progress Notes (Signed)
Office Visit Note   Patient: Kimberly Terrell. Kimberly Terrell           Date of Birth: 20-Jul-1975           MRN: [id number] Visit Date: 06/29/2022              Requested by: [name], MD [address] [place],  [place] [address] PCP: [name], MD  Chief Complaint  Patient presents with   Right Foot - Pain      HPI: Patient is a 47 year old  Woman who is seen for right forefoot pain.  Patient states she has previously been diagnosed with a Morton's neuroma.  She would like to consider surgery.  Most recent radiographs were obtained and June 2023.  Assessment & Plan: Visit Diagnoses:  1. Achilles tendon contracture, right   2. Metatarsalgia, right foot     Plan: Patient has Achilles contracture with a long second third and fourth metatarsal.  Recommended Achilles stretching this was demonstrated.  Recommended a carbon plate.  Discussed that surgical intervention would require Achilles lengthening and Weil osteotomies for the second third and fourth metatarsals.  Follow-Up Instructions: No follow-ups on file.   Ortho Exam  Patient is alert, oriented, no adenopathy, well-dressed, normal affect, normal respiratory effort. Examination patient has good dorsalis pedis pulse.  With her knee extended she has dorsiflexion 20 degrees short of neutral with Achilles contracture of the ankle.  She has clawing of the toes 2 3 and 4.  There is callus beneath the second third and fourth metatarsal heads.  Pain to palpation beneath the metatarsal heads and not in the webspace.  She has no Morton's neuroma symptoms.  Imaging: No results found. No images are attached to the encounter.  Labs: Lab Results  Component Value Date   REPTSTATUS 12/12/2021 FINAL 12/06/2021   CULT  12/06/2021    NO GROWTH 5 DAYS Performed at [facility], [address], [place], [place] [address]      Lab Results  Component Value Date   ALBUMIN 2.1 (L) 12/05/2021   ALBUMIN 3.3 (L) 12/03/2021    ALBUMIN 4.2 04/17/2020    Lab Results  Component Value Date   MG 1.9 12/15/2021   MG 1.8 12/12/2021   MG 1.9 12/09/2021   No results found for: "VD25OH"  No results found for: "PREALBUMIN"    Latest Ref Rng & Units 01/27/2022   11:38 AM 01/04/2022   10:22 AM 12/15/2021    5:54 AM  CBC EXTENDED  WBC 3.4 - 10.8 x10E3/uL 7.7  8.4  13.5   RBC 3.77 - 5.28 x10E6/uL 4.08  4.26  3.72   Hemoglobin 11.1 - 15.9 g/dL 9.5  08.6  76.1   HCT 95.0 - 46.6 % 31.5  34.0  30.3   Platelets 150 - 450 x10E3/uL 295  341  938   NEUT# 1.4 - 7.0 x10E3/uL  5.3    Lymph# 0.7 - 3.1 x10E3/uL  2.1       There is no height or weight on file to calculate BMI.  Orders:  No orders of the defined types were placed in this encounter.  No orders of the defined types were placed in this encounter.    Procedures: No procedures performed  Clinical Data: No additional findings.  ROS:  All other systems negative, except as noted in the HPI. Review of Systems  Objective: Vital Signs: LMP 06/01/2022 (Exact Date)   Specialty Comments:  No specialty comments available.  PMFS History: Patient Active Problem List   Diagnosis Date Noted   History of diarrhea 04/06/2022   Colon cancer screening 04/06/2022   History of Helicobacter pylori infection 04/06/2022   Hemorrhoids 04/06/2022   Insomnia 03/09/2022   Pruritic dermatitis 02/04/2022   Acute heart failure 01/04/2022   Thrombocytosis 01/04/2022   Demand ischemia    Perforated viscus 12/04/2021   Gastric ulcer 12/04/2021   Morton's neuroma of right foot 09/23/2021   Bunion of left foot 09/23/2021   Controlled substance agreement broken 04/09/2020   Benzodiazepine dependence 04/09/2020   Shoulder pain, bilateral 02/27/2020   Asthma 10/30/2019   Chronic hip pain 10/30/2019   TOA (tubo-ovarian abscess) 07/04/2019   IUD infection 07/04/2019   Gastroesophageal reflux disease without esophagitis 05/04/2019   H. pylori infection 03/28/2019    Abnormal CT of the abdomen 03/28/2019   Chronic daily headache 10/12/2017   Allergic rhinitis 07/25/2017   Depression, recurrent 04/04/2017   Migraine 04/04/2017   Bursitis of right hip 04/04/2017   GAD (generalized anxiety disorder) 04/04/2017   DUB (dysfunctional uterine bleeding) 04/04/2017   Past Medical History:  Diagnosis Date   Anxiety    Arthritis    Asthma    Bursitis    Right Hip and Right Knee   Depression    H. pylori infection    s/p triple therapy x 2   Migraines     Family History  Problem Relation Age of Onset   Depression Mother    Depression Daughter    Colon cancer Neg Hx    Esophageal cancer Neg Hx    Gastric cancer Neg Hx    Inflammatory bowel disease Neg Hx    Liver disease Neg Hx    Pancreatic cancer Neg Hx    Rectal cancer Neg Hx    Stomach cancer Neg Hx     Past Surgical History:  Procedure Laterality Date   COSMETIC SURGERY  2008   breast implants    gastric ulcer surgery     gastric ulcer perforation surgery   LAPAROTOMY N/A 12/04/2021   Procedure: EXPLORATORY LAPAROTOMY;  Surgeon: Harriette Bouillon, MD;  Location: [facility] OR;  Service: General;  Laterality: N/A;   Social History   Occupational History   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, advertising  Tobacco Use   Smoking status: Former    Packs/day: 1    Types: Cigarettes    Quit date: 12/03/2021    Years since quitting: 0.5   Smokeless tobacco: Never   Tobacco comments:    Trying   Vaping Use   Vaping Use: Never used  Substance and Sexual Activity   Alcohol use: No   Drug use: No   Sexual activity: Yes    Birth control/protection: I.U.D., Surgical    Comment: husband has had a vasectomy

## 2022-07-05 ENCOUNTER — Ambulatory Visit (INDEPENDENT_AMBULATORY_CARE_PROVIDER_SITE_OTHER)

## 2022-07-05 ENCOUNTER — Encounter: Payer: Self-pay | Admitting: Pulmonary Disease

## 2022-07-05 ENCOUNTER — Ambulatory Visit: Admitting: Pulmonary Disease

## 2022-07-05 VITALS — BP 124/68 | HR 71 | Ht 64.0 in | Wt 162.8 lb

## 2022-07-05 DIAGNOSIS — R0609 Other forms of dyspnea: Secondary | ICD-10-CM

## 2022-07-05 MED ORDER — LEVALBUTEROL TARTRATE 45 MCG/ACT IN AERO: 2.0000 | INHALATION_SPRAY | Freq: Four times a day (QID) | RESPIRATORY_TRACT | 12 refills | Status: DC | PRN

## 2022-07-05 NOTE — Patient Instructions (Addendum)
Nice to meet you  To most aggressively treat asthma use this inhaler Breztri.  2 puffs twice a day every day.  Rinse her mouth out and spit with water after every use.  This is the most aggressive inhaler for asthma.  I sent a prescription for levalbuterol.  This should have less effect on the heart and albuterol.  However, I often find it is cost prohibitive, often too expensive.  If so please let me know.  We can try something little bit different but may not be as effective.  Will get a chest x-ray today  I ordered pulmonary function test for further evaluation and see if we can come to a diagnosis of why you are short of breath  It is quite possible that it is all related to asthma.  Especially with worsening cough over the last few weeks.  Lets see if the Markus Daft helps with the shortness of breath and the cough.  Return to clinic in 2 months or sooner as needed with Dr. Judeth Horn

## 2022-07-06 NOTE — Progress Notes (Signed)
CXR clear

## 2022-07-07 ENCOUNTER — Other Ambulatory Visit: Payer: Self-pay | Admitting: Cardiovascular Disease

## 2022-07-07 ENCOUNTER — Telehealth: Payer: Self-pay | Admitting: Pulmonary Disease

## 2022-07-08 NOTE — Telephone Encounter (Signed)
Lm x1 for patient.  

## 2022-07-09 MED ORDER — BREZTRI AEROSPHERE 160-9-4.8 MCG/ACT IN AERO: 2.0000 | INHALATION_SPRAY | Freq: Two times a day (BID) | RESPIRATORY_TRACT | 5 refills | Status: DC

## 2022-07-09 NOTE — Telephone Encounter (Signed)
Pt's spouse Barbara Cower called the office. He stated that pt has been using the Breztri inhaler sample that was given to them at the last office visit. Pt is not scheduled to follow up with office until 6-8 weeks out from last OV (no appt has been scheduled with Dr. Judeth Horn yet as his schedule was not avail when pt was last seen).   Per Barbara Cower as well as pt, about 30 minutes after using the Breztri inhaler, pt will start having episode where she feels lightheaded. She said that this does not happen each time after using the Markus Daft but is happening ever so often.   Other than episodes of lightheadedness, pt said that the inhaler has really helped with her breathing.  Before sending an Rx to the pharmacy for pt, wanted to make sure that it would be okay since pt has had some episodes with  lightheadedness about 30 minutes after using it.  With Dr. Judeth Horn not being in office today, routing this to provider of the day for review.  Sarah, please advise on this.  **Please route back to triage and not directly back to me as I'm up front on the phones**

## 2022-07-09 NOTE — Telephone Encounter (Signed)
Kimberly Ngo, NP  You2 minutes ago (1:11 PM)    Lets get her back into the office to evaluate . She may do better with Trelegy. She may do better with a spacer. Why does [facility] not have a schedule?    Called and spoke with Kimberly Terrell about the info stated by Maralyn Sago. Kimberly Terrell stated that even though pt has been having the episodes of the lightheadedness, the episodes are getting better. He stated that pt has noticed improvement with her breathing while being on the Kinbrae sample that was provided at last OV.  Kimberly Terrell stated that Dr. Judeth Horn did tell them that when pt first started using the medication, she might experience some side effects so she was prepared for that from the beginning.  Pt even stated that the episodes are getting better after she is getting used to being on the medication and does want to know if a prescription can be prescribed.  Both pt and Kimberly Terrell were wanting to have an Rx for this sent to the pharmacy and they said the main reason why they were originally calling was the fact that pt was almost out of the sample and wanted to have an Rx for the [facility] sent to the pharmacy so that way pt would not run out of the medication.  Rx for Kimberly Terrell has been sent to pharmacy for pt.  Routing to Dr. Judeth Horn as an Lorain Childes.   Nothing further needed.

## 2022-07-19 ENCOUNTER — Ambulatory Visit (INDEPENDENT_AMBULATORY_CARE_PROVIDER_SITE_OTHER): Admitting: Pulmonary Disease

## 2022-07-19 DIAGNOSIS — R0609 Other forms of dyspnea: Secondary | ICD-10-CM | POA: Diagnosis not present

## 2022-07-19 LAB — PULMONARY FUNCTION TEST
DL/VA % pred: 93 %
DL/VA: 4.06 ml/min/mmHg/L
DLCO cor % pred: 90 %
DLCO cor: 19.29 ml/min/mmHg
DLCO unc % pred: 90 %
DLCO unc: 19.29 ml/min/mmHg
FEF 25-75 Post: 4.09 L/sec
FEF 25-75 Pre: 3.42 L/sec
FEF2575-%Change-Post: 19 %
FEF2575-%Pred-Post: 140 %
FEF2575-%Pred-Pre: 117 %
FEV1-%Change-Post: 7 %
FEV1-%Pred-Post: 96 %
FEV1-%Pred-Pre: 89 %
FEV1-Post: 2.79 L
FEV1-Pre: 2.58 L
FEV1FVC-%Change-Post: -2 %
FEV1FVC-%Pred-Pre: 107 %
FEV6-%Change-Post: 10 %
FEV6-%Pred-Post: 93 %
FEV6-%Pred-Pre: 84 %
FEV6-Post: 3.29 L
FEV6-Pre: 2.97 L
FEV6FVC-%Pred-Post: 102 %
FEV6FVC-%Pred-Pre: 102 %
FVC-%Change-Post: 10 %
FVC-%Pred-Post: 91 %
FVC-%Pred-Pre: 82 %
FVC-Post: 3.29 L
FVC-Pre: 2.97 L
Post FEV1/FVC ratio: 85 %
Post FEV6/FVC ratio: 100 %
Pre FEV1/FVC ratio: 87 %
Pre FEV6/FVC Ratio: 100 %
RV % pred: 94 %
RV: 1.62 L
TLC % pred: 96 %
TLC: 4.86 L

## 2022-07-19 NOTE — Progress Notes (Signed)
Full PFT Performed Today  

## 2022-07-19 NOTE — Patient Instructions (Signed)
Full PFT Performed Today  

## 2022-07-24 ENCOUNTER — Other Ambulatory Visit: Payer: Self-pay

## 2022-07-24 ENCOUNTER — Encounter (HOSPITAL_BASED_OUTPATIENT_CLINIC_OR_DEPARTMENT_OTHER): Payer: Self-pay | Admitting: Emergency Medicine

## 2022-07-24 DIAGNOSIS — M25512 Pain in left shoulder: Secondary | ICD-10-CM | POA: Diagnosis present

## 2022-07-24 DIAGNOSIS — Z5321 Procedure and treatment not carried out due to patient leaving prior to being seen by health care provider: Secondary | ICD-10-CM | POA: Insufficient documentation

## 2022-07-24 NOTE — ED Triage Notes (Signed)
Presents for L shoulder pain that is intermittently present for months. Has an appt for CT Monday but pain is overwhelming at this time. Only left today but sometimes affect both shoulders.  Pt endorses radiation down arm. Has tried steroids and muscle relaxers which helped for some time.

## 2022-07-25 ENCOUNTER — Emergency Department (HOSPITAL_BASED_OUTPATIENT_CLINIC_OR_DEPARTMENT_OTHER): Admitting: Radiology

## 2022-07-25 ENCOUNTER — Emergency Department (HOSPITAL_BASED_OUTPATIENT_CLINIC_OR_DEPARTMENT_OTHER)
Admission: EM | Admit: 2022-07-25 | Discharge: 2022-07-25 | Discharge status: Against Medical Advice | Attending: Emergency Medicine | Admitting: Emergency Medicine

## 2022-07-26 ENCOUNTER — Encounter: Payer: Self-pay | Admitting: Physician Assistant

## 2022-07-26 ENCOUNTER — Ambulatory Visit: Admitting: Physician Assistant

## 2022-07-26 DIAGNOSIS — M25512 Pain in left shoulder: Secondary | ICD-10-CM

## 2022-07-26 DIAGNOSIS — M25511 Pain in right shoulder: Secondary | ICD-10-CM | POA: Diagnosis not present

## 2022-07-26 DIAGNOSIS — G8929 Other chronic pain: Secondary | ICD-10-CM | POA: Diagnosis not present

## 2022-07-26 NOTE — Progress Notes (Signed)
Office Visit Note   Patient: Kimberly Terrell. Kalish           Date of Birth: 1975-10-07           MRN: [id number] Visit Date: 07/26/2022              Requested by: [name], MD [address] [place],  [place] [address] PCP: [name], MD  Chief Complaint  Patient presents with   Left Shoulder - Pain   Right Shoulder - Pain      HPI: Patient is a 47 year old woman who follows up with me today for left shoulder pain.  She states this is now been going on a year.  She denies any specific injury.  She also has some pain in the right shoulder but she describes the pain in the left shoulder as 8 out of 10 and constant.  Denies really any neck pain.  She has tried conservative treatment including anti-inflammatories steroid taper and exercises without any improvement.    Assessment & Plan: Visit Diagnoses:  1. Chronic pain of both shoulders     Plan: 1 year history of left shoulder pain.  X-rays are benign.  Do not completely explain her pain.  She has failed conservative treatment including medication activity modification and still continues to have pain that is 8 out of 10.  Will order an MRI arthrogram will call her with our neck step  Follow-Up Instructions: No follow-ups on file.   Ortho Exam  Patient is alert, oriented, no adenopathy, well-dressed, normal affect, normal respiratory effort. Examination of her left shoulder she has forward elevation but does have internal rotation behind the back is tender she has a positive empty cans test negative speeds test no pain with external rotation minimal impingement findings with Neer testing she has good grip strength her strength is intact with resisted abduction external/internal rotation  Imaging: No results found. No images are attached to the encounter.  Labs: Lab Results  Component Value Date   REPTSTATUS 12/12/2021 FINAL 12/06/2021   CULT  12/06/2021    NO GROWTH 5 DAYS Performed at [facility], [address] [place], [place] [address]      Lab Results  Component Value Date   ALBUMIN 2.1 (L) 12/05/2021   ALBUMIN 3.3 (L) 12/03/2021   ALBUMIN 4.2 04/17/2020    Lab Results  Component Value Date   MG 1.9 12/15/2021   MG 1.8 12/12/2021   MG 1.9 12/09/2021   No results found for: "VD25OH"  No results found for: "PREALBUMIN"    Latest Ref Rng & Units 01/27/2022   11:38 AM 01/04/2022   10:22 AM 12/15/2021    5:54 AM  CBC EXTENDED  WBC 3.4 - 10.8 x10E3/uL 7.7  8.4  13.5   RBC 3.77 - 5.28 x10E6/uL 4.08  4.26  3.72   Hemoglobin 11.1 - 15.9 g/dL 9.5  82.9  56.2   HCT 13.0 - 46.6 % 31.5  34.0  30.3   Platelets 150 - 450 x10E3/uL 295  341  938   NEUT# 1.4 - 7.0 x10E3/uL  5.3    Lymph# 0.7 - 3.1 x10E3/uL  2.1       There is no height or weight on file to calculate BMI.  Orders:  Orders Placed This Encounter  Procedures   Arthrogram   MR Shoulder Left w/ contrast   No orders of the defined types were placed in this encounter.    Procedures: No  procedures performed  Clinical Data: No additional findings.  ROS:  All other systems negative, except as noted in the HPI. Review of Systems  Objective: Vital Signs: LMP 07/10/2022 (Approximate)   Specialty Comments:  No specialty comments available.  PMFS History: Patient Active Problem List   Diagnosis Date Noted   History of diarrhea 04/06/2022   Colon cancer screening 04/06/2022   History of Helicobacter pylori infection 04/06/2022   Hemorrhoids 04/06/2022   Insomnia 03/09/2022   Pruritic dermatitis 02/04/2022   Acute heart failure (HCC) 01/04/2022   Thrombocytosis 01/04/2022   Demand ischemia    Perforated viscus 12/04/2021   Gastric ulcer 12/04/2021   Morton's neuroma of right foot 09/23/2021   Bunion of left foot 09/23/2021   Controlled substance agreement broken 04/09/2020   Benzodiazepine dependence (HCC) 04/09/2020   Shoulder pain, bilateral 02/27/2020   Asthma 10/30/2019   Chronic  hip pain 10/30/2019   TOA (tubo-ovarian abscess) 07/04/2019   IUD infection (HCC) 07/04/2019   Gastroesophageal reflux disease without esophagitis 05/04/2019   H. pylori infection 03/28/2019   Abnormal CT of the abdomen 03/28/2019   Chronic daily headache 10/12/2017   Allergic rhinitis 07/25/2017   Depression, recurrent (HCC) 04/04/2017   Migraine 04/04/2017   Bursitis of right hip 04/04/2017   GAD (generalized anxiety disorder) 04/04/2017   DUB (dysfunctional uterine bleeding) 04/04/2017   Past Medical History:  Diagnosis Date   Anxiety    Arthritis    Asthma    Bursitis    Right Hip and Right Knee   Depression    H. pylori infection    s/p triple therapy x 2   Migraines     Family History  Problem Relation Age of Onset   Depression Mother    Depression Daughter    Colon cancer Neg Hx    Esophageal cancer Neg Hx    Gastric cancer Neg Hx    Inflammatory bowel disease Neg Hx    Liver disease Neg Hx    Pancreatic cancer Neg Hx    Rectal cancer Neg Hx    Stomach cancer Neg Hx     Past Surgical History:  Procedure Laterality Date   COSMETIC SURGERY  2008   breast implants    gastric ulcer surgery     gastric ulcer perforation surgery   LAPAROTOMY N/A 12/04/2021   Procedure: EXPLORATORY LAPAROTOMY;  Surgeon: Harriette Bouillon, MD;  Location: [facility] OR;  Service: General;  Laterality: N/A;   Social History   Occupational History   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, advertising  Tobacco Use   Smoking status: Former    Packs/day: 1    Types: Cigarettes    Quit date: 12/03/2021    Years since quitting: 0.6   Smokeless tobacco: Never   Tobacco comments:    Trying   Vaping Use   Vaping Use: Never used  Substance and Sexual Activity   Alcohol use: No   Drug use: No   Sexual activity: Yes    Birth control/protection: I.U.D., Surgical    Comment: husband has had a vasectomy

## 2022-08-05 ENCOUNTER — Ambulatory Visit
Admission: RE | Admit: 2022-08-05 | Discharge: 2022-08-05 | Disposition: A | Discharge status: Home / Self Care | Source: Ambulatory Visit | Attending: Physician Assistant | Admitting: Physician Assistant

## 2022-08-05 DIAGNOSIS — G8929 Other chronic pain: Secondary | ICD-10-CM

## 2022-08-05 MED ORDER — IOPAMIDOL (ISOVUE-M 200) INJECTION 41%
13.0000 mL | Freq: Once | INTRAMUSCULAR | Status: AC
  Administered 2022-08-05: 13 mL via INTRA_ARTICULAR

## 2022-08-06 ENCOUNTER — Other Ambulatory Visit: Payer: Self-pay | Admitting: Student

## 2022-08-06 ENCOUNTER — Ambulatory Visit (INDEPENDENT_AMBULATORY_CARE_PROVIDER_SITE_OTHER): Admitting: Student

## 2022-08-06 VITALS — BP 124/76 | HR 68 | Temp 97.8°F | Ht 64.0 in | Wt 164.5 lb

## 2022-08-06 DIAGNOSIS — E875 Hyperkalemia: Secondary | ICD-10-CM

## 2022-08-06 DIAGNOSIS — K635 Polyp of colon: Secondary | ICD-10-CM

## 2022-08-06 DIAGNOSIS — F411 Generalized anxiety disorder: Secondary | ICD-10-CM | POA: Diagnosis not present

## 2022-08-06 DIAGNOSIS — K631 Perforation of intestine (nontraumatic): Secondary | ICD-10-CM

## 2022-08-06 DIAGNOSIS — R198 Other specified symptoms and signs involving the digestive system and abdomen: Secondary | ICD-10-CM | POA: Diagnosis not present

## 2022-08-06 MED ORDER — BUSPIRONE HCL 10 MG PO TABS
10.0000 mg | ORAL_TABLET | Freq: Two times a day (BID) | ORAL | 3 refills | Status: DC
Start: 2022-08-06 — End: 2022-08-06

## 2022-08-06 MED ORDER — BUSPIRONE HCL 10 MG PO TABS
20.0000 mg | ORAL_TABLET | Freq: Two times a day (BID) | ORAL | 3 refills | Status: DC
Start: 2022-08-06 — End: 2022-08-06

## 2022-08-06 NOTE — Patient Instructions (Addendum)
We will check lab work today and I will cal with results  If having fevers, nausea, or vomiting over the weekend please go to the ED.   Please increase buspar to 10 mg twice daily and continue lexapro 20 mg daily I have made an referral for counseling as well. You can try taking a dose of hydroxyzine as needed for really bad episodes of anxiety but this may cause sleepiness  Follow up in 1 month

## 2022-08-06 NOTE — Telephone Encounter (Signed)
Call from pt's husband stating the pharmacy told him his wife cannot pick up Buspar 20 mg BID until June 6th. And what ca be done until then. Buspar 10mg  take 2 tabs BID - clarified for [facility] and pt's husband. Rx was sent to the pharmacy by Dr Elaina Pattee. Husband informed pharmacy is getting it ready.

## 2022-08-07 LAB — CMP14 + ANION GAP
ALT: 13 IU/L (ref 0–32)
AST: 17 IU/L (ref 0–40)
Albumin/Globulin Ratio: 1.9 (ref 1.2–2.2)
Albumin: 4.6 g/dL (ref 3.9–4.9)
Alkaline Phosphatase: 82 IU/L (ref 44–121)
Anion Gap: 15 mmol/L (ref 10.0–18.0)
BUN/Creatinine Ratio: 12 (ref 9–23)
BUN: 9 mg/dL (ref 6–24)
Bilirubin Total: <0.2 mg/dL (ref 0.0–1.2)
CO2: 19 mmol/L — ABNORMAL LOW (ref 20–29)
Calcium: 9.5 mg/dL (ref 8.7–10.2)
Chloride: 102 mmol/L (ref 96–106)
Creatinine, Ser: 0.74 mg/dL (ref 0.57–1.00)
Globulin, Total: 2.4 g/dL (ref 1.5–4.5)
Glucose: 96 mg/dL (ref 70–99)
Potassium: 5.5 mmol/L — ABNORMAL HIGH (ref 3.5–5.2)
Sodium: 136 mmol/L (ref 134–144)
Total Protein: 7 g/dL (ref 6.0–8.5)
eGFR: 101 mL/min/{1.73_m2} (ref >59)

## 2022-08-07 LAB — CBC
Hematocrit: 36.3 % (ref 34.0–46.6)
Hemoglobin: 11.1 g/dL (ref 11.1–15.9)
MCH: 24.1 pg — ABNORMAL LOW (ref 26.6–33.0)
MCHC: 30.6 g/dL — ABNORMAL LOW (ref 31.5–35.7)
MCV: 79 fL (ref 79–97)
Platelets: 373 10*3/uL (ref 150–450)
RBC: 4.61 x10E6/uL (ref 3.77–5.28)
RDW: 15.6 % — ABNORMAL HIGH (ref 11.7–15.4)
WBC: 10.9 10*3/uL — ABNORMAL HIGH (ref 3.4–10.8)

## 2022-08-09 ENCOUNTER — Encounter: Payer: Self-pay | Admitting: Student

## 2022-08-09 DIAGNOSIS — K635 Polyp of colon: Secondary | ICD-10-CM | POA: Insufficient documentation

## 2022-08-09 DIAGNOSIS — E875 Hyperkalemia: Secondary | ICD-10-CM | POA: Insufficient documentation

## 2022-08-09 NOTE — Progress Notes (Signed)
Established Patient Office Visit  Subjective   Patient ID: Kimberly Terrell. Gregorek, female    DOB: 07-Aug-1975  Age: 47 y.o. MRN: [id number]  Chief Complaint  Patient presents with   pain right side    Kimberly Terrell is a 47 y.o. person living with a history listed below who presents to clinic for abdominal pain for the past 2-3 weeks. Please refer to problem based charting for further details and assessment and plan of current problem and chronic medical conditions.     Patient Active Problem List   Diagnosis Date Noted   Colon polyps 08/09/2022   Hyperkalemia 08/09/2022   History of diarrhea 04/06/2022   Colon cancer screening 04/06/2022   History of Helicobacter pylori infection 04/06/2022   Hemorrhoids 04/06/2022   Insomnia 03/09/2022   Pruritic dermatitis 02/04/2022   Acute heart failure (HCC) 01/04/2022   Thrombocytosis 01/04/2022   Demand ischemia    Perforated viscus 12/04/2021   Gastric ulcer 12/04/2021   Morton's neuroma of right foot 09/23/2021   Bunion of left foot 09/23/2021   Controlled substance agreement broken 04/09/2020   Benzodiazepine dependence (HCC) 04/09/2020   Shoulder pain, bilateral 02/27/2020   Asthma 10/30/2019   Chronic hip pain 10/30/2019   TOA (tubo-ovarian abscess) 07/04/2019   IUD infection (HCC) 07/04/2019   Gastroesophageal reflux disease without esophagitis 05/04/2019   H. pylori infection 03/28/2019   Abnormal CT of the abdomen 03/28/2019   Chronic daily headache 10/12/2017   Allergic rhinitis 07/25/2017   Depression, recurrent (HCC) 04/04/2017   Migraine 04/04/2017   Bursitis of right hip 04/04/2017   GAD (generalized anxiety disorder) 04/04/2017   DUB (dysfunctional uterine bleeding) 04/04/2017      Review of Systems  Constitutional:  Negative for chills and fever.  Gastrointestinal:  Positive for abdominal pain. Negative for blood in stool, constipation, diarrhea, melena, nausea and vomiting.  All other systems reviewed and  are negative.     Objective:     BP 124/76   Pulse 68   Temp 97.8 F (36.6 C) (Oral)   Ht 5\' 4"  (1.626 m)   Wt 164 lb 8 oz (74.6 kg)   LMP 07/10/2022 (Approximate)   SpO2 99%   BMI 28.24 kg/m  BP Readings from Last 3 Encounters:  08/06/22 124/76  07/24/22 126/64  07/05/22 124/68      Physical Exam Constitutional:      Appearance: Normal appearance.  HENT:     Mouth/Throat:     Mouth: Mucous membranes are moist.     Pharynx: Oropharynx is clear.  Cardiovascular:     Rate and Rhythm: Normal rate and regular rhythm.  Pulmonary:     Effort: Pulmonary effort is normal.     Breath sounds: No rhonchi or rales.  Abdominal:     General: Abdomen is flat. Bowel sounds are normal. There is no distension.     Palpations: Abdomen is soft.     Tenderness: There is no guarding or rebound.     Comments: Large well-healed midline surgical scar, healed stab incision of the right periumbilical area from prior surgical drain, no overlying skin changes, no drainage, no tenderness with palpation  Musculoskeletal:        General: Normal range of motion.     Right lower leg: No edema.     Left lower leg: No edema.  Skin:    General: Skin is warm and dry.     Capillary Refill: Capillary refill takes less than 2 seconds.  Neurological:     General: No focal deficit present.     Mental Status: She is alert and oriented to person, place, and time.  Psychiatric:        Mood and Affect: Mood normal.        Behavior: Behavior normal.     Results for orders placed or performed in visit on 08/06/22  CBC no Diff  Result Value Ref Range   WBC 10.9 (H) 3.4 - 10.8 x10E3/uL   RBC 4.61 3.77 - 5.28 x10E6/uL   Hemoglobin 11.1 11.1 - 15.9 g/dL   Hematocrit 16.1 09.6 - 46.6 %   MCV 79 79 - 97 fL   MCH 24.1 (L) 26.6 - 33.0 pg   MCHC 30.6 (L) 31.5 - 35.7 g/dL   RDW 04.5 (H) 40.9 - 81.1 %   Platelets 373 150 - 450 x10E3/uL  CMP14 + Anion Gap  Result Value Ref Range   Glucose 96 70 - 99 mg/dL    BUN 9 6 - 24 mg/dL   Creatinine, Ser 9.14 0.57 - 1.00 mg/dL   eGFR 782 >95 AO/ZHY/8.65   BUN/Creatinine Ratio 12 9 - 23   Sodium 136 134 - 144 mmol/L   Potassium 5.5 (H) 3.5 - 5.2 mmol/L   Chloride 102 96 - 106 mmol/L   CO2 19 (L) 20 - 29 mmol/L   Anion Gap 15.0 10.0 - 18.0 mmol/L   Calcium 9.5 8.7 - 10.2 mg/dL   Total Protein 7.0 6.0 - 8.5 g/dL   Albumin 4.6 3.9 - 4.9 g/dL   Globulin, Total 2.4 1.5 - 4.5 g/dL   Albumin/Globulin Ratio 1.9 1.2 - 2.2   Bilirubin Total <0.2 0.0 - 1.2 mg/dL   Alkaline Phosphatase 82 44 - 121 IU/L   AST 17 0 - 40 IU/L   ALT 13 0 - 32 IU/L    Last CBC Lab Results  Component Value Date   WBC 10.9 (H) 08/06/2022   HGB 11.1 08/06/2022   HCT 36.3 08/06/2022   MCV 79 08/06/2022   MCH 24.1 (L) 08/06/2022   RDW 15.6 (H) 08/06/2022   PLT 373 08/06/2022   Last metabolic panel Lab Results  Component Value Date   GLUCOSE 96 08/06/2022   NA 136 08/06/2022   K 5.5 (H) 08/06/2022   CL 102 08/06/2022   CO2 19 (L) 08/06/2022   BUN 9 08/06/2022   CREATININE 0.74 08/06/2022   EGFR 101 08/06/2022   CALCIUM 9.5 08/06/2022   PROT 7.0 08/06/2022   ALBUMIN 4.6 08/06/2022   LABGLOB 2.4 08/06/2022   AGRATIO 1.9 08/06/2022   BILITOT <0.2 08/06/2022   ALKPHOS 82 08/06/2022   AST 17 08/06/2022   ALT 13 08/06/2022   ANIONGAP 10 12/15/2021      The ASCVD Risk score (Arnett DK, et al., 2019) failed to calculate for the following reasons:   The patient has a prior MI or stroke diagnosis    Assessment & Plan:   Problem List Items Addressed This Visit     GAD (generalized anxiety disorder)    More anxious lately due to stressors relating to her daughter.  Is taking Lexapro 20 mg daily and BuSpar 15 mg twice daily.  Daily feelings of increased anxiety especially when interacting with her daughter.  Will increase her BuSpar given frequency of her symptoms to 20 mg twice daily.  Can take hydroxyzine for acute anxiety symptoms although this can cause  sleepiness and would avoid taking this daily.  Will make  referral to [facility].  Will need repeat GAD-7 and PHQ-9 at next visit.      Relevant Orders   Ambulatory referral to [facility]   Perforated viscus - Primary    Patient reports stabbing pain at prior drain site on the right periumbilical area.  This has been ongoing for about 2 to 3 weeks.  No relieving or exacerbating factors.  No trauma to the area or signs of drainage.  She had an EGD March with mild reactive changes, no new ulcerations or acute findings, pathology negative for celiac and H. pylori and continued on PPI.  She denies any nausea, vomiting, diarrhea, constipation, hematochezia.  No fevers, chills, or signs of systemic infection.  On exam she has well-healed midline abdominal scar and scar above the right abdomen from prior drain site.  No redness, erythema, swelling, fluctuance on exam.  No tenderness on palpation during exam.  Stable vitals.  Will check CBC and CMP today.  Precautions given for ED follow-up if having worsening symptoms over the weekend      Relevant Orders   CBC no Diff (Completed)   CMP14 + Anion Gap (Completed)   Colon polyps    Recent colonscopy in March with 2 sessile serrated polyps.  Needs repeat colonoscopy in 3 years in 2027      Hyperkalemia    Elevated on BMP.  Years prior suprasternally for potassium in November but she has not been taking this. No other medications that would contribute to hyperkalemia. Repeat BMP at lab only visit this week.       Relevant Orders   BMP8+Anion Gap    Return in about 4 weeks (around 09/03/2022).    Quincy Simmonds, MD

## 2022-08-09 NOTE — Assessment & Plan Note (Addendum)
More anxious lately due to stressors relating to her daughter.  Is taking Lexapro 20 mg daily and BuSpar 15 mg twice daily.  Daily feelings of increased anxiety especially when interacting with her daughter.  Will increase her BuSpar given frequency of her symptoms to 20 mg twice daily.  Can take hydroxyzine for acute anxiety symptoms although this can cause sleepiness and would avoid taking this daily.  Will make referral to [facility].  Will need repeat GAD-7 and PHQ-9 at next visit.

## 2022-08-09 NOTE — Assessment & Plan Note (Signed)
Elevated on BMP.  Years prior suprasternally for potassium in November but she has not been taking this. No other medications that would contribute to hyperkalemia. Repeat BMP at lab only visit this week.

## 2022-08-09 NOTE — Assessment & Plan Note (Addendum)
Patient reports stabbing pain at prior drain site on the right periumbilical area.  This has been ongoing for about 2 to 3 weeks.  No relieving or exacerbating factors.  No trauma to the area or signs of drainage.  She had an EGD March with mild reactive changes, no new ulcerations or acute findings, pathology negative for celiac and H. pylori and continued on PPI.  She denies any nausea, vomiting, diarrhea, constipation, hematochezia.  No fevers, chills, or signs of systemic infection.  On exam she has well-healed midline abdominal scar and scar above the right abdomen from prior drain site.  No redness, erythema, swelling, fluctuance on exam.  No tenderness on palpation during exam.  Stable vitals.  Will check CBC and CMP today.  Precautions given for ED follow-up if having worsening symptoms over the weekend

## 2022-08-09 NOTE — Progress Notes (Signed)
[facility] Attending ? ?Case discussed with Dr. Liang  At the time of the visit.  We reviewed the resident?s history and exam and pertinent patient test results.  I agree with the assessment, diagnosis, and plan of care documented in the resident?s note. ? ?

## 2022-08-09 NOTE — Assessment & Plan Note (Addendum)
Recent colonscopy in March with 2 sessile serrated polyps.  Needs repeat colonoscopy in 3 years in 2027

## 2022-08-12 ENCOUNTER — Emergency Department (HOSPITAL_BASED_OUTPATIENT_CLINIC_OR_DEPARTMENT_OTHER)
Admission: EM | Admit: 2022-08-12 | Discharge: 2022-08-12 | Disposition: A | Discharge status: Home / Self Care | Attending: Emergency Medicine | Admitting: Emergency Medicine

## 2022-08-12 ENCOUNTER — Emergency Department (HOSPITAL_BASED_OUTPATIENT_CLINIC_OR_DEPARTMENT_OTHER)

## 2022-08-12 ENCOUNTER — Other Ambulatory Visit: Payer: Self-pay

## 2022-08-12 ENCOUNTER — Encounter (HOSPITAL_BASED_OUTPATIENT_CLINIC_OR_DEPARTMENT_OTHER): Payer: Self-pay | Admitting: Emergency Medicine

## 2022-08-12 ENCOUNTER — Other Ambulatory Visit (INDEPENDENT_AMBULATORY_CARE_PROVIDER_SITE_OTHER)

## 2022-08-12 DIAGNOSIS — J45909 Unspecified asthma, uncomplicated: Secondary | ICD-10-CM | POA: Diagnosis not present

## 2022-08-12 DIAGNOSIS — D509 Iron deficiency anemia, unspecified: Secondary | ICD-10-CM | POA: Insufficient documentation

## 2022-08-12 DIAGNOSIS — E875 Hyperkalemia: Secondary | ICD-10-CM | POA: Diagnosis not present

## 2022-08-12 DIAGNOSIS — R109 Unspecified abdominal pain: Secondary | ICD-10-CM | POA: Diagnosis present

## 2022-08-12 LAB — COMPREHENSIVE METABOLIC PANEL
ALT: 11 U/L (ref 0–44)
AST: 15 U/L (ref 15–41)
Albumin: 4.5 g/dL (ref 3.5–5.0)
Alkaline Phosphatase: 74 U/L (ref 38–126)
Anion gap: 7 (ref 5–15)
BUN: 11 mg/dL (ref 6–20)
CO2: 26 mmol/L (ref 22–32)
Calcium: 9.5 mg/dL (ref 8.9–10.3)
Chloride: 104 mmol/L (ref 98–111)
Creatinine, Ser: 0.83 mg/dL (ref 0.44–1.00)
GFR, Estimated: >60 mL/min (ref >60)
Glucose, Bld: 98 mg/dL (ref 70–99)
Potassium: 4.5 mmol/L (ref 3.5–5.1)
Sodium: 137 mmol/L (ref 135–145)
Total Bilirubin: 0.3 mg/dL (ref 0.3–1.2)
Total Protein: 7.3 g/dL (ref 6.5–8.1)

## 2022-08-12 LAB — CBC
HCT: 33.6 % — ABNORMAL LOW (ref 36.0–46.0)
Hemoglobin: 10.4 g/dL — ABNORMAL LOW (ref 12.0–15.0)
MCH: 24.4 pg — ABNORMAL LOW (ref 26.0–34.0)
MCHC: 31 g/dL (ref 30.0–36.0)
MCV: 78.9 fL — ABNORMAL LOW (ref 80.0–100.0)
Platelets: 336 10*3/uL (ref 150–400)
RBC: 4.26 MIL/uL (ref 3.87–5.11)
RDW: 15.9 % — ABNORMAL HIGH (ref 11.5–15.5)
WBC: 10 10*3/uL (ref 4.0–10.5)
nRBC: 0 % (ref 0.0–0.2)

## 2022-08-12 LAB — LIPASE, BLOOD: Lipase: <10 U/L — ABNORMAL LOW (ref 11–51)

## 2022-08-12 LAB — HCG, SERUM, QUALITATIVE: Preg, Serum: NEGATIVE

## 2022-08-12 MED ORDER — IOHEXOL 300 MG/ML  SOLN
100.0000 mL | Freq: Once | INTRAMUSCULAR | Status: AC | PRN
  Administered 2022-08-12: 85 mL via INTRAVENOUS

## 2022-08-12 NOTE — ED Notes (Signed)
Pt returned from CT at this time.  

## 2022-08-12 NOTE — ED Triage Notes (Signed)
Sharp stabbing intermittent pain for a few week. Had rupture stomach 8 months ago pain is at same place as drainage site.  Reports recent blood work with PCP with high wbc and k Also reports periods of high HR

## 2022-08-12 NOTE — ED Notes (Signed)
Patient reported SOB, PVCs noted on bedside monitor, MD at bedside for evaluation. Patient states has had frequent SOB since her surgery.

## 2022-08-12 NOTE — ED Provider Notes (Signed)
[place] [facility] AT [facility] Provider Note   CSN: [id number] Arrival date & time: 08/12/22  1815     History  Chief Complaint  Patient presents with   Abdominal Pain    [place] Ter[place] is a 47 y.o. female.  Patient presents to the emergency department complaining of right-sided sharp, stabbing intermittent abdominal pain which is ongoing for the past week.  She states she had a perforated gastric ulcer in September of this year and feels that her pain is at the same site as the previous drainage site.  Upon arrival the patient denies nausea, vomiting, constipation, diarrhea, urinary symptoms, vaginal discharge, chest pain, shortness of breath.  While in the emergency department the patient began to feel mildly short of breath.  She continues to deny chest pain. Past medical history significant for H. pylori infection on Protonix, anxiety, depression, asthma, migraines, GERD, TOA, benzodiazepine dependence, demand ischemia HPI     Home Medications Prior to Admission medications   Medication Sig Start Date End Date Taking? Authorizing Provider  acetaminophen (TYLENOL) 500 MG tablet Take 2 tablets (1,000 mg total) by mouth every 6 (six) hours as needed. 12/15/21   Barnetta Chapel, PA-C  Acetaminophen-guaiFENesin [facility] COLD & FLU) 325-200 MG CAPS Take 2 tablets by mouth daily as needed (cold symptoms).    [provider]  albuterol (VENTOLIN HFA) 108 (90 Base) MCG/ACT inhaler Inhale 2 puffs into the lungs every 6 (six) hours as needed for wheezing or shortness of breath. 10/22/20   Jannifer Rodney A, FNP  Budeson-Glycopyrrol-Formoterol (BREZTRI AEROSPHERE) 160-9-4.8 MCG/ACT AERO Inhale 2 puffs into the lungs in the morning and at bedtime. 07/09/22   Hunsucker, Lesia Sago, MD  busPIRone (BUSPAR) 10 MG tablet Take 2 tablets (20 mg total) by mouth 2 (two) times daily. 08/06/22   Quincy Simmonds, MD  diclofenac sodium (VOLTAREN) 1 % GEL Apply 2 g topically 4  (four) times daily. Patient taking differently: Apply 2 g topically 4 (four) times daily as needed (pain). 06/27/18   Bennie Pierini, FNP  escitalopram (LEXAPRO) 10 MG tablet TAKE 2 TABLETS BY MOUTH EVERY DAY 03/01/22   Willette Cluster, MD  hydrocortisone (ANUSOL-HC) 25 MG suppository Place 1 suppository (25 mg total) rectally daily. 06/18/22   Mansouraty, Netty Starring., MD  hydrOXYzine (ATARAX) 10 MG tablet TAKE 1 TABLET BY MOUTH THREE TIMES A DAY AS NEEDED 03/30/22   [name], MD  levalbuterol [facility] HFA) 45 MCG/ACT inhaler Inhale 2 puffs into the lungs every 6 (six) hours as needed for wheezing or shortness of breath. 07/05/22   Hunsucker, Lesia Sago, MD  Melatonin 10 MG CAPS Take 10 mg by mouth at bedtime.    [provider]  metoprolol succinate (TOPROL-XL) 25 MG 24 hr tablet TAKE 1 TABLET (25 MG TOTAL) BY MOUTH DAILY. 07/07/22   Wendall Stade, MD  pantoprazole (PROTONIX) 40 MG tablet Take 1 tablet (40 mg total) by mouth 2 (two) times daily. 06/18/22   Mansouraty, Netty Starring., MD  ramelteon (ROZEREM) 8 MG tablet TAKE 1 TABLET (8 MG TOTAL) BY MOUTH AT BEDTIME AS NEEDED FOR SLEEP. TAKE 30 MINUTES BEFORE BEDTIME. 06/01/22 06/01/23  [name], MD  traZODone (DESYREL) 100 MG tablet TAKE 1 TABLET BY MOUTH EVERYDAY AT BEDTIME 03/30/22   [name], MD      Allergies    Imitrex [sumatriptan], Tape, and Aspirin    Review of Systems   Review of Systems  Physical Exam Updated Vital Signs BP (!) 112/58  Pulse 73   Temp 98 F (36.7 C) (Oral)   Resp 10   LMP 07/10/2022 (Approximate)   SpO2 99%  Physical Exam Vitals and nursing note reviewed.  Constitutional:      General: She is not in acute distress.    Appearance: She is well-developed.  HENT:     Head: Normocephalic and atraumatic.  Eyes:     Conjunctiva/sclera: Conjunctivae normal.  Cardiovascular:     Rate and Rhythm: Normal rate and regular rhythm.     Heart sounds: No murmur  heard. Pulmonary:     Effort: Pulmonary effort is normal. No respiratory distress.     Breath sounds: Normal breath sounds.  Abdominal:     Palpations: Abdomen is soft.     Tenderness: There is abdominal tenderness (Right-sided abdominal tenderness to palpation).  Musculoskeletal:        General: No swelling.     Cervical back: Neck supple.  Skin:    General: Skin is warm and dry.     Capillary Refill: Capillary refill takes less than 2 seconds.  Neurological:     Mental Status: She is alert.  Psychiatric:        Mood and Affect: Mood normal.     ED Results / Procedures / Treatments   Labs (all labs ordered are listed, but only abnormal results are displayed) Labs Reviewed  LIPASE, BLOOD - Abnormal; Notable for the following components:      Result Value   Lipase <10 (*)    All other components within normal limits  CBC - Abnormal; Notable for the following components:   Hemoglobin 10.4 (*)    HCT 33.6 (*)    MCV 78.9 (*)    MCH 24.4 (*)    RDW 15.9 (*)    All other components within normal limits  COMPREHENSIVE METABOLIC PANEL  HCG, SERUM, QUALITATIVE    EKG None  Radiology CT ABDOMEN PELVIS W CONTRAST  Result Date: 08/12/2022 CLINICAL DATA:  Right side abdominal pain. EXAM: CT ABDOMEN AND PELVIS WITH CONTRAST TECHNIQUE: Multidetector CT imaging of the abdomen and pelvis was performed using the standard protocol following bolus administration of intravenous contrast. RADIATION DOSE REDUCTION: This exam was performed according to the departmental dose-optimization program which includes automated exposure control, adjustment of the mA and/or kV according to patient size and/or use of iterative reconstruction technique. CONTRAST:  85mL OMNIPAQUE IOHEXOL 300 MG/ML  SOLN COMPARISON:  12/14/2021 6 FINDINGS: Lower chest: No acute abnormality Hepatobiliary: Gallbladder is contracted. No visible stones. No focal hepatic abnormality or biliary ductal dilatation. Pancreas: No  focal abnormality or ductal dilatation. Spleen: No focal abnormality.  Normal size. Adrenals/Urinary Tract: No adrenal abnormality. No focal renal abnormality. No stones or hydronephrosis. Urinary bladder is unremarkable. Stomach/Bowel: Stomach, large and small bowel grossly unremarkable. Vascular/Lymphatic: No evidence of aneurysm or adenopathy. Reproductive: Uterus and adnexa unremarkable.  No mass. Other: No free fluid or free air. Musculoskeletal: No acute bony abnormality. IMPRESSION: No acute findings in the abdomen or pelvis. Electronically Signed   By: Charlett Nose M.D.   On: 08/12/2022 21:13    Procedures Procedures    Medications Ordered in ED Medications  iohexol (OMNIPAQUE) 300 MG/ML solution 100 mL (85 mLs Intravenous Contrast Given 08/12/22 2056)    ED Course/ Medical Decision Making/ A&P                             Medical Decision Making Amount  and/or Complexity of Data Reviewed Labs: ordered. Radiology: ordered.  Risk Prescription drug management.   This patient presents to the ED for concern of abdominal pain, this involves an extensive number of treatment options, and is a complaint that carries with it a high risk of complications and morbidity.  The differential diagnosis includes appendicitis, cholecystitis, perforated ulcer, gastritis, others   Co morbidities that complicate the patient evaluation  History of perforated ulcer, H. pylori infection   Additional history obtained:  Additional history obtained from family at bedside External records from outside source obtained and reviewed including internal medicine notes from May 10 when patient had a follow-up appointment.   Lab Tests:  I Ordered, and personally interpreted labs.  The pertinent results include: CBC showed microcytic anemia with hemoglobin 10.4, consistent with patient's baseline.  CMP, lipase unremarkable.  Negative pregnancy test.   Imaging Studies ordered:  I ordered imaging studies  including CT abdomen pelvis with contrast I independently visualized and interpreted imaging which showed no acute findings in the abdomen or pelvis I agree with the radiologist interpretation   Cardiac Monitoring: / EKG:  The patient was maintained on a cardiac monitor.  I personally viewed and interpreted the cardiac monitored which showed an underlying rhythm of: Initially sinus rhythm, later ventricular bigeminy    Social Determinants of Health:  Patient has [employer] for her primary insurance   Test / Admission - Considered:  Patient with no acute abdominal findings on abdominal CT.  Labs are grossly unremarkable.  Patient's pain is 3 or 4 out of 10 in severity at this time.  No acute distress noted.  No indication at this time for admission.  Question of this could be gastritis with patient's underlying H. pylori infection.  Patient advised to avoid spicy foods and acidic foods and to follow-up with her primary care team.  Return precautions provided.         Final Clinical Impression(s) / ED Diagnoses Final diagnoses:  Abdominal pain, unspecified abdominal location    Rx / DC Orders ED Discharge Orders     None         Pamala Duffel 08/12/22 2156    Cathren Laine, MD 08/12/22 2221

## 2022-08-12 NOTE — ED Notes (Signed)
EKG shown to PA [place] and to MD [place]. No new orders at this time. EKG significantly changed from initial.

## 2022-08-12 NOTE — Discharge Instructions (Signed)
You were evaluated today for your right-sided abdominal pain.  Your workup was reassuring with no acute findings on your CT scan and no significantly abnormal labs.  Please follow-up with your primary care team for further evaluation and management.  If you develop any life-threatening symptoms such as intractable nausea vomiting, chest pain, shortness of breath, please return to the emergency department for reevaluation

## 2022-08-12 NOTE — ED Notes (Signed)
Reviewed AVS with patient, patient expressed understanding of directions, denies further questions at this time. 

## 2022-08-13 LAB — BMP8+ANION GAP
Anion Gap: 14 mmol/L (ref 10.0–18.0)
BUN/Creatinine Ratio: 14 (ref 9–23)
BUN: 10 mg/dL (ref 6–24)
CO2: 22 mmol/L (ref 20–29)
Calcium: 9.4 mg/dL (ref 8.7–10.2)
Chloride: 101 mmol/L (ref 96–106)
Creatinine, Ser: 0.72 mg/dL (ref 0.57–1.00)
Glucose: 87 mg/dL (ref 70–99)
Potassium: 4.8 mmol/L (ref 3.5–5.2)
Sodium: 137 mmol/L (ref 134–144)
eGFR: 104 mL/min/{1.73_m2} (ref >59)

## 2022-08-16 ENCOUNTER — Encounter: Payer: Self-pay | Admitting: Pulmonary Disease

## 2022-08-16 ENCOUNTER — Ambulatory Visit (INDEPENDENT_AMBULATORY_CARE_PROVIDER_SITE_OTHER): Admitting: Pulmonary Disease

## 2022-08-16 VITALS — BP 118/70 | HR 67 | Temp 98.3°F | Ht 65.0 in | Wt 165.9 lb

## 2022-08-16 DIAGNOSIS — J4540 Moderate persistent asthma, uncomplicated: Secondary | ICD-10-CM

## 2022-08-16 MED ORDER — PREDNISONE 20 MG PO TABS: 20.0000 mg | ORAL_TABLET | Freq: Every day | ORAL | 0 refills | Status: AC

## 2022-08-16 NOTE — Progress Notes (Signed)
@Patient  ID: Kimberly Terrell. [name], female    DOB: 1975-04-15, 47 y.o.   MRN: [id number]  Chief Complaint  Patient presents with   Follow-up    PFT follow up, SOB improved     Referring provider: [name]  HPI:   47 y.o. woman whom we are seeing for dyspnea on exertion, history of asthma.    At last visit, concern for poorly controlled asthma.  Given multiple exacerbations, bronchitis etc. placed on triple inhaled therapy via Breztri.  This helped her day-to-day breathing.  Much better.  Pleased with this.  PFTs performed in interim reviewed in detail with patient, spirometry normal, no bronchodilator response but 320 cc and 10% increase in FEV1 noted, lung volumes within normal notes, DLCO within normal notes.  She notes worsening cough over the last few days.  Productive of phlegm which is unusual.  Kimberly Terrell.  Daughter recently bronchitis symptoms.  HPI initial visit: In general, breathing not much of an issue.  Then she had acute of stomach pain.  Had gastric perforation.  Status post repair.  Since then she has not quite been the same.  More dyspneic.  More short of breath.  Associated cough at times.  Albuterol helps some but gives her jitteriness, palpitations.  Does not like the way it makes her feel.  I cannot do any chest images in the interim since her hospitalization.  He has associated cough.  Sounds like some bouts of bronchitis.  Workup for dyspnea include cardiac workup, CT coronary 12/2021 reviewed and clear lungs, no significant coronary disease.  Underwent echocardiogram 01/2022 that did reveal some mitral valve regurgitation, otherwise reassuring.  While hospitalized, she did have a CT angio of the chest that demonstrated mild emphysema, otherwise clear lungs.  PMH: Asthma, GERD Family history: Mother with depression Social history: Former smoker, quit 2023, lives in [place] Surgical history: Breast augmentation, gastric ulcer perforation status post  repair     Questionaires / Pulmonary Flowsheets:   ACT:      No data to display           MMRC:     No data to display           Epworth:      No data to display           Tests:   FENO:  No results found for: "NITRICOXIDE"  PFT:    Latest Ref Rng & Units 07/19/2022   11:07 AM  PFT Results  FVC-Pre L 2.97   FVC-Predicted Pre % 82   FVC-Post L 3.29   FVC-Predicted Post % 91   Pre FEV1/FVC % % 87   Post FEV1/FCV % % 85   FEV1-Pre L 2.58   FEV1-Predicted Pre % 89   FEV1-Post L 2.79   DLCO uncorrected ml/min/mmHg 19.29   DLCO UNC% % 90   DLCO corrected ml/min/mmHg 19.29   DLCO COR %Predicted % 90   DLVA Predicted % 93   TLC L 4.86   TLC % Predicted % 96   RV % Predicted % 94   Personally reviewed and interpreted as spirometry normal, no bronchodilator response but 320 cc and 10% increase in FEV1 noted, lung volumes within normal notes, DLCO within normal notes.  WALK:      No data to display           Imaging: Personally reviewed and as per EMR discussion this note CT ABDOMEN PELVIS W CONTRAST  Result Date: 08/12/2022 CLINICAL  DATA:  Right side abdominal pain. EXAM: CT ABDOMEN AND PELVIS WITH CONTRAST TECHNIQUE: Multidetector CT imaging of the abdomen and pelvis was performed using the standard protocol following bolus administration of intravenous contrast. RADIATION DOSE REDUCTION: This exam was performed according to the departmental dose-optimization program which includes automated exposure control, adjustment of the mA and/or kV according to patient size and/or use of iterative reconstruction technique. CONTRAST:  85mL OMNIPAQUE IOHEXOL 300 MG/ML  SOLN COMPARISON:  12/14/2021 6 FINDINGS: Lower chest: No acute abnormality Hepatobiliary: Gallbladder is contracted. No visible stones. No focal hepatic abnormality or biliary ductal dilatation. Pancreas: No focal abnormality or ductal dilatation. Spleen: No focal abnormality.  Normal size.  Adrenals/Urinary Tract: No adrenal abnormality. No focal renal abnormality. No stones or hydronephrosis. Urinary bladder is unremarkable. Stomach/Bowel: Stomach, large and small bowel grossly unremarkable. Vascular/Lymphatic: No evidence of aneurysm or adenopathy. Reproductive: Uterus and adnexa unremarkable.  No mass. Other: No free fluid or free air. Musculoskeletal: No acute bony abnormality. IMPRESSION: No acute findings in the abdomen or pelvis. Electronically Signed   By: Charlett Nose M.D.   On: 08/12/2022 21:13   MR Shoulder Left w/ contrast  Result Date: 08/10/2022 CLINICAL DATA:  Left shoulder pain that comes and goes for 1 year. EXAM: MRI OF THE LEFT SHOULDER WITH CONTRAST TECHNIQUE: Multiplanar, multisequence MR imaging of the left shoulder was performed following the administration of intra-articular contrast. CONTRAST:  See Injection Documentation. COMPARISON:  left shoulder radiographs 06/10/2022 FINDINGS: Rotator cuff: There is a fluid bright partial-thickness midsubstance tear of the mid to anterior aspect of the supraspinatus tendon footprint measuring up to 4 mm in transverse dimension (coronal series 13, images 7 and 8) and 8 mm in AP dimension (sagittal series 7, image 13). No tendon retraction. There is mild underlying supraspinatus intermediate T2 signal tendinosis. The infraspinatus, subscapularis, and teres minor are intact. Muscles: No rotator cuff muscle atrophy, fatty infiltration, or edema. Biceps long head: The intra-articular long head of the biceps tendon is intact. Acromioclavicular Joint: Normal alignment of the clavicular joint with minimal joint space narrowing and peripheral osteophytosis. Type 2 acromion. No subacromial/subdeltoid bursitis. Glenohumeral Joint: Mild glenohumeral cartilage thinning. Labrum: There is adequate distention of the glenohumeral joint following arthrogram injection. There is mild smooth extension of gadolinium contrast into the posterosuperior  chondrolabral junction (axial series 3 images 8 through 11) favored to represent the normal chondrolabral contour. No definite glenoid labral tear is seen. Bones: No acute fracture. Mild subcortical cystic changes within the humeral head deep to the anterior supraspinatus tendon footprint tear described above. Other: None. IMPRESSION: 1. Partial-thickness midsubstance tear of the mid to anterior supraspinatus tendon footprint measuring up to 4 mm in transverse dimension and 8 mm in AP dimension. No tendon retraction. 2. Minimal acromioclavicular osteoarthritis. Electronically Signed   By: Neita Garnet M.D.   On: 08/10/2022 16:19   Arthrogram  Result Date: 08/05/2022 CLINICAL DATA:  Left shoulder pain. EXAM: LEFT SHOULDER INJECTION UNDER FLUOROSCOPY COMPARISON:  None Available. FLUOROSCOPY: Radiation Exposure Index (as provided by the fluoroscopic device): 0.2 mGy Kerma PROCEDURE: The risks and benefits of the procedure were discussed with the patient, and written informed consent was obtained. The patient stated no history of allergy to contrast media. A formal timeout procedure was performed with the patient according to departmental protocol. The patient was placed supine on the fluoroscopy table and the left glenohumeral joint was identified under fluoroscopy. The skin overlying the left glenohumeral joint was subsequently cleaned with Betadine and a  sterile drape was placed over the area of interest. 2 ml 1% Lidocaine was used to anesthetize the skin around the needle insertion site. A 22 gauge spinal needle was inserted into the left glenohumeral joint under fluoroscopy. 12 ml of gadolinium mixture (0.1 ml of Multihance mixed with 15 ml of Isovue-M 200 contrast and 5 ml of sterile saline) were injected into the left glenohumeral joint. The needle was removed and hemostasis was achieved. The patient was subsequently transferred to MRI for imaging. IMPRESSION: Technically successful left shoulder injection  for MRI. Electronically Signed   By: Obie Dredge M.D.   On: 08/05/2022 16:02    Lab Results: Personally reviewed, notably eosinophils elevated in the past CBC    Component Value Date/Time   WBC 10.0 08/12/2022 1845   RBC 4.26 08/12/2022 1845   HGB 10.4 (L) 08/12/2022 1845   HGB 11.1 08/06/2022 1018   HCT 33.6 (L) 08/12/2022 1845   HCT 36.3 08/06/2022 1018   PLT 336 08/12/2022 1845   PLT 373 08/06/2022 1018   MCV 78.9 (L) 08/12/2022 1845   MCV 79 08/06/2022 1018   MCH 24.4 (L) 08/12/2022 1845   MCHC 31.0 08/12/2022 1845   RDW 15.9 (H) 08/12/2022 1845   RDW 15.6 (H) 08/06/2022 1018   LYMPHSABS 2.1 01/04/2022 1022   MONOABS 0.8 12/03/2021 2200   EOSABS 0.5 (H) 01/04/2022 1022   BASOSABS 0.1 01/04/2022 1022    BMET    Component Value Date/Time   NA 137 08/12/2022 1845   NA 137 08/12/2022 1104   K 4.5 08/12/2022 1845   CL 104 08/12/2022 1845   CO2 26 08/12/2022 1845   GLUCOSE 98 08/12/2022 1845   BUN 11 08/12/2022 1845   BUN 10 08/12/2022 1104   CREATININE 0.83 08/12/2022 1845   CALCIUM 9.5 08/12/2022 1845   GFRNONAA >60 08/12/2022 1845   GFRAA 117 04/09/2020 1113    BNP    Component Value Date/Time   BNP 152.1 (H) 01/27/2022 1138    ProBNP No results found for: "PROBNP"  Specialty Problems       Pulmonary Problems   Allergic rhinitis   Asthma    Allergies  Allergen Reactions   Imitrex [Sumatriptan] Other (See Comments)    "Gave me lockjaw"   Tape Other (See Comments)    The "plastic-like tape causes redness and irritation"   Aspirin Other (See Comments)    Caused chest pain    Immunization History  Administered Date(s) Administered   Moderna Sars-Covid-2 Vaccination 12/23/2019, 01/20/2020    Past Medical History:  Diagnosis Date   Anxiety    Arthritis    Asthma    Bursitis    Right Hip and Right Knee   Depression    H. pylori infection    s/p triple therapy x 2   Migraines     Tobacco History: Social History   Tobacco Use   Smoking Status Former   Packs/day: 1   Types: Cigarettes   Quit date: 12/03/2021   Years since quitting: 0.7  Smokeless Tobacco Never  Tobacco Comments   Trying    Counseling given: Not Answered Tobacco comments: Trying    Continue to not smoke  Outpatient Encounter Medications as of 08/16/2022  Medication Sig   acetaminophen (TYLENOL) 500 MG tablet Take 2 tablets (1,000 mg total) by mouth every 6 (six) hours as needed.   Acetaminophen-guaiFENesin (MUCINEX COLD & FLU) 325-200 MG CAPS Take 2 tablets by mouth daily as needed (cold symptoms).  albuterol (VENTOLIN HFA) 108 (90 Base) MCG/ACT inhaler Inhale 2 puffs into the lungs every 6 (six) hours as needed for wheezing or shortness of breath.   Budeson-Glycopyrrol-Formoterol (BREZTRI AEROSPHERE) 160-9-4.8 MCG/ACT AERO Inhale 2 puffs into the lungs in the morning and at bedtime.   busPIRone (BUSPAR) 10 MG tablet Take 2 tablets (20 mg total) by mouth 2 (two) times daily.   diclofenac sodium (VOLTAREN) 1 % GEL Apply 2 g topically 4 (four) times daily. (Patient taking differently: Apply 2 g topically 4 (four) times daily as needed (pain).)   escitalopram (LEXAPRO) 10 MG tablet TAKE 2 TABLETS BY MOUTH EVERY DAY   hydrocortisone (ANUSOL-HC) 25 MG suppository Place 1 suppository (25 mg total) rectally daily.   hydrOXYzine (ATARAX) 10 MG tablet TAKE 1 TABLET BY MOUTH THREE TIMES A DAY AS NEEDED   levalbuterol (XOPENEX HFA) 45 MCG/ACT inhaler Inhale 2 puffs into the lungs every 6 (six) hours as needed for wheezing or shortness of breath.   Melatonin 10 MG CAPS Take 10 mg by mouth at bedtime.   metoprolol succinate (TOPROL-XL) 25 MG 24 hr tablet TAKE 1 TABLET (25 MG TOTAL) BY MOUTH DAILY.   pantoprazole (PROTONIX) 40 MG tablet Take 1 tablet (40 mg total) by mouth 2 (two) times daily.   predniSONE (DELTASONE) 20 MG tablet Take 1 tablet (20 mg total) by mouth daily with breakfast for 5 days.   ramelteon (ROZEREM) 8 MG tablet TAKE 1 TABLET (8 MG  TOTAL) BY MOUTH AT BEDTIME AS NEEDED FOR SLEEP. TAKE 30 MINUTES BEFORE BEDTIME.   traZODone (DESYREL) 100 MG tablet TAKE 1 TABLET BY MOUTH EVERYDAY AT BEDTIME   No facility-administered encounter medications on file as of 08/16/2022.     Review of Systems  Review of Systems  N/a Physical Exam  BP 118/70 (BP Location: Right Arm, Patient Position: Sitting, Cuff Size: Normal)   Pulse 67   Temp 98.3 F (36.8 C) (Oral)   Ht 5\' 5"  (1.651 m)   Wt 165 lb 14.2 oz (75.2 kg)   LMP 07/10/2022 (Approximate)   SpO2 98%   BMI 27.61 kg/m   Wt Readings from Last 5 Encounters:  08/16/22 165 lb 14.2 oz (75.2 kg)  08/06/22 164 lb 8 oz (74.6 kg)  07/24/22 160 lb (72.6 kg)  07/05/22 162 lb 12.8 oz (73.8 kg)  06/18/22 147 lb (66.7 kg)    BMI Readings from Last 5 Encounters:  08/16/22 27.61 kg/m  08/06/22 28.24 kg/m  07/24/22 27.46 kg/m  07/05/22 27.94 kg/m  06/18/22 26.04 kg/m     Physical Exam General: Sitting in chair, no acute distress Eyes: EOMI, no icterus Neck: Supple, no JVP Pulmonary: Clear, normal work of breathing Cardiovascular: Warm, no edema Abdomen: Nondistended, bowel sounds present MSK: No synovitis, no joint effusion Neuro: Normal gait, no weakness Psych: Normal mood, full affect   Assessment & Plan:   Dyspnea on exertion: High suspicion for uncontrolled asthma.  Mild emphysema on prior chest imaging.  Possibly contributing.  She has quit smoking.  PFTs normal.  Chest x-ray clear.  Likely dyspnea related to asthma, improved in interim as below.  Subacute cough/bronchitis: Now productive of phlegm.  Initially improved with initial treatment over the last few days worsen.  Prednisone 20 mg for 5 days, assess response  Asthma: Suspect poorly controlled and etiology of symptoms at time of referral.  Much improved with Breztri.  To continue.   Return in about 6 months (around 02/16/2023).   Karren Burly, MD  08/16/2022     

## 2022-08-16 NOTE — Progress Notes (Signed)
@Patient  ID: Kimberly Terrell, female    DOB: 12/08/1975, 47 y.o.   MRN: [id number]  Chief Complaint  Patient presents with   Consult    Consult for SOB. Pt states that it states about 7 months ago when she had surgery. Pt states that she has been on albuterol for years for her asthma. Pt states that after her surgery it just did not help anylonger. BP medications make it worse. Tingling in fingers and lips noted from patient. Pt states that she is having issues with her hart rate and is taking meds for it but it is causig her to have issues with her SOB.     Referring provider: [name]  HPI:   47 y.o. woman whom we are seeing for dyspnea on exertion, history of asthma.  Most recent PCP note.  Extensive review of records from hospitalization 2023 for gastric ulcer perforation and subsequent repair.  Discharge summary reviewed.  In general, breathing not much of an issue.  Then she had acute of stomach pain.  Had gastric perforation.  Status post repair.  Since then she has not quite been the same.  More dyspneic.  More short of breath.  Associated cough at times.  Albuterol helps some but gives her jitteriness, palpitations.  Does not like the way it makes her feel.  I cannot do any chest images in the interim since her hospitalization.  He has associated cough.  Sounds like some bouts of bronchitis.  Workup for dyspnea include cardiac workup, CT coronary 12/2021 reviewed and clear lungs, no significant coronary disease.  Underwent echocardiogram 01/2022 that did reveal some mitral valve regurgitation, otherwise reassuring.  While hospitalized, she did have a CT angio of the chest that demonstrated mild emphysema, otherwise clear lungs.  PMH: Asthma, GERD Family history: Mother with depression Social history: Former smoker, quit 2023, lives in [place] Surgical history: Breast augmentation, gastric ulcer perforation status post repair     Questionaires / Pulmonary  Flowsheets:   ACT:      No data to display          MMRC:     No data to display          Epworth:      No data to display          Tests:   FENO:  No results found for: "NITRICOXIDE"  PFT:    Latest Ref Rng & Units 07/19/2022   11:07 AM  PFT Results  FVC-Pre L 2.97   FVC-Predicted Pre % 82   FVC-Post L 3.29   FVC-Predicted Post % 91   Pre FEV1/FVC % % 87   Post FEV1/FCV % % 85   FEV1-Pre L 2.58   FEV1-Predicted Pre % 89   FEV1-Post L 2.79   DLCO uncorrected ml/min/mmHg 19.29   DLCO UNC% % 90   DLCO corrected ml/min/mmHg 19.29   DLCO COR %Predicted % 90   DLVA Predicted % 93   TLC L 4.86   TLC % Predicted % 96   RV % Predicted % 94     WALK:      No data to display          Imaging: Personally reviewed and as per EMR discussion this note CT ABDOMEN PELVIS W CONTRAST  Result Date: 08/12/2022 CLINICAL DATA:  Right side abdominal pain. EXAM: CT ABDOMEN AND PELVIS WITH CONTRAST TECHNIQUE: Multidetector CT imaging of the abdomen and pelvis was performed using the standard  protocol following bolus administration of intravenous contrast. RADIATION DOSE REDUCTION: This exam was performed according to the departmental dose-optimization program which includes automated exposure control, adjustment of the mA and/or kV according to patient size and/or use of iterative reconstruction technique. CONTRAST:  85mL OMNIPAQUE IOHEXOL 300 MG/ML  SOLN COMPARISON:  12/14/2021 6 FINDINGS: Lower chest: No acute abnormality Hepatobiliary: Gallbladder is contracted. No visible stones. No focal hepatic abnormality or biliary ductal dilatation. Pancreas: No focal abnormality or ductal dilatation. Spleen: No focal abnormality.  Normal size. Adrenals/Urinary Tract: No adrenal abnormality. No focal renal abnormality. No stones or hydronephrosis. Urinary bladder is unremarkable. Stomach/Bowel: Stomach, large and small bowel grossly unremarkable. Vascular/Lymphatic: No evidence of  aneurysm or adenopathy. Reproductive: Uterus and adnexa unremarkable.  No mass. Other: No free fluid or free air. Musculoskeletal: No acute bony abnormality. IMPRESSION: No acute findings in the abdomen or pelvis. Electronically Signed   By: Charlett Nose M.D.   On: 08/12/2022 21:13   MR Shoulder Left w/ contrast  Result Date: 08/10/2022 CLINICAL DATA:  Left shoulder pain that comes and goes for 1 year. EXAM: MRI OF THE LEFT SHOULDER WITH CONTRAST TECHNIQUE: Multiplanar, multisequence MR imaging of the left shoulder was performed following the administration of intra-articular contrast. CONTRAST:  See Injection Documentation. COMPARISON:  left shoulder radiographs 06/10/2022 FINDINGS: Rotator cuff: There is a fluid bright partial-thickness midsubstance tear of the mid to anterior aspect of the supraspinatus tendon footprint measuring up to 4 mm in transverse dimension (coronal series 13, images 7 and 8) and 8 mm in AP dimension (sagittal series 7, image 13). No tendon retraction. There is mild underlying supraspinatus intermediate T2 signal tendinosis. The infraspinatus, subscapularis, and teres minor are intact. Muscles: No rotator cuff muscle atrophy, fatty infiltration, or edema. Biceps long head: The intra-articular long head of the biceps tendon is intact. Acromioclavicular Joint: Normal alignment of the clavicular joint with minimal joint space narrowing and peripheral osteophytosis. Type 2 acromion. No subacromial/subdeltoid bursitis. Glenohumeral Joint: Mild glenohumeral cartilage thinning. Labrum: There is adequate distention of the glenohumeral joint following arthrogram injection. There is mild smooth extension of gadolinium contrast into the posterosuperior chondrolabral junction (axial series 3 images 8 through 11) favored to represent the normal chondrolabral contour. No definite glenoid labral tear is seen. Bones: No acute fracture. Mild subcortical cystic changes within the humeral head deep to  the anterior supraspinatus tendon footprint tear described above. Other: None. IMPRESSION: 1. Partial-thickness midsubstance tear of the mid to anterior supraspinatus tendon footprint measuring up to 4 mm in transverse dimension and 8 mm in AP dimension. No tendon retraction. 2. Minimal acromioclavicular osteoarthritis. Electronically Signed   By: Neita Garnet M.D.   On: 08/10/2022 16:19   Arthrogram  Result Date: 08/05/2022 CLINICAL DATA:  Left shoulder pain. EXAM: LEFT SHOULDER INJECTION UNDER FLUOROSCOPY COMPARISON:  None Available. FLUOROSCOPY: Radiation Exposure Index (as provided by the fluoroscopic device): 0.2 mGy Kerma PROCEDURE: The risks and benefits of the procedure were discussed with the patient, and written informed consent was obtained. The patient stated no history of allergy to contrast media. A formal timeout procedure was performed with the patient according to departmental protocol. The patient was placed supine on the fluoroscopy table and the left glenohumeral joint was identified under fluoroscopy. The skin overlying the left glenohumeral joint was subsequently cleaned with Betadine and a sterile drape was placed over the area of interest. 2 ml 1% Lidocaine was used to anesthetize the skin around the needle insertion site. A 22 gauge  spinal needle was inserted into the left glenohumeral joint under fluoroscopy. 12 ml of gadolinium mixture (0.1 ml of Multihance mixed with 15 ml of Isovue-M 200 contrast and 5 ml of sterile saline) were injected into the left glenohumeral joint. The needle was removed and hemostasis was achieved. The patient was subsequently transferred to MRI for imaging. IMPRESSION: Technically successful left shoulder injection for MRI. Electronically Signed   By: Obie Dredge M.D.   On: 08/05/2022 16:02    Lab Results: Personally reviewed, notably eosinophils elevated in the past CBC    Component Value Date/Time   WBC 10.0 08/12/2022 1845   RBC 4.26 08/12/2022  1845   HGB 10.4 (L) 08/12/2022 1845   HGB 11.1 08/06/2022 1018   HCT 33.6 (L) 08/12/2022 1845   HCT 36.3 08/06/2022 1018   PLT 336 08/12/2022 1845   PLT 373 08/06/2022 1018   MCV 78.9 (L) 08/12/2022 1845   MCV 79 08/06/2022 1018   MCH 24.4 (L) 08/12/2022 1845   MCHC 31.0 08/12/2022 1845   RDW 15.9 (H) 08/12/2022 1845   RDW 15.6 (H) 08/06/2022 1018   LYMPHSABS 2.1 01/04/2022 1022   MONOABS 0.8 12/03/2021 2200   EOSABS 0.5 (H) 01/04/2022 1022   BASOSABS 0.1 01/04/2022 1022    BMET    Component Value Date/Time   NA 137 08/12/2022 1845   NA 137 08/12/2022 1104   K 4.5 08/12/2022 1845   CL 104 08/12/2022 1845   CO2 26 08/12/2022 1845   GLUCOSE 98 08/12/2022 1845   BUN 11 08/12/2022 1845   BUN 10 08/12/2022 1104   CREATININE 0.83 08/12/2022 1845   CALCIUM 9.5 08/12/2022 1845   GFRNONAA >60 08/12/2022 1845   GFRAA 117 04/09/2020 1113    BNP    Component Value Date/Time   BNP 152.1 (H) 01/27/2022 1138    ProBNP No results found for: "PROBNP"  Specialty Problems       Pulmonary Problems   Allergic rhinitis   Asthma    Allergies  Allergen Reactions   Imitrex [Sumatriptan] Other (See Comments)    "Gave me lockjaw"   Tape Other (See Comments)    The "plastic-like tape causes redness and irritation"   Aspirin Other (See Comments)    Caused chest pain    Immunization History  Administered Date(s) Administered   Moderna Sars-Covid-2 Vaccination 12/23/2019, 01/20/2020    Past Medical History:  Diagnosis Date   Anxiety    Arthritis    Asthma    Bursitis    Right Hip and Right Knee   Depression    H. pylori infection    s/p triple therapy x 2   Migraines     Tobacco History: Social History   Tobacco Use  Smoking Status Former   Packs/day: 1   Types: Cigarettes   Quit date: 12/03/2021   Years since quitting: 0.7  Smokeless Tobacco Never  Tobacco Comments   Trying    Counseling given: Not Answered Tobacco comments: Trying    Continue to not  smoke  Outpatient Encounter Medications as of 07/05/2022  Medication Sig   acetaminophen (TYLENOL) 500 MG tablet Take 2 tablets (1,000 mg total) by mouth every 6 (six) hours as needed.   Acetaminophen-guaiFENesin (MUCINEX COLD & FLU) 325-200 MG CAPS Take 2 tablets by mouth daily as needed (cold symptoms).   albuterol (VENTOLIN HFA) 108 (90 Base) MCG/ACT inhaler Inhale 2 puffs into the lungs every 6 (six) hours as needed for wheezing or shortness of breath.  diclofenac sodium (VOLTAREN) 1 % GEL Apply 2 g topically 4 (four) times daily. (Patient taking differently: Apply 2 g topically 4 (four) times daily as needed (pain).)   escitalopram (LEXAPRO) 10 MG tablet TAKE 2 TABLETS BY MOUTH EVERY DAY   hydrocortisone (ANUSOL-HC) 25 MG suppository Place 1 suppository (25 mg total) rectally daily.   hydrOXYzine (ATARAX) 10 MG tablet TAKE 1 TABLET BY MOUTH THREE TIMES A DAY AS NEEDED   levalbuterol (XOPENEX HFA) 45 MCG/ACT inhaler Inhale 2 puffs into the lungs every 6 (six) hours as needed for wheezing or shortness of breath.   Melatonin 10 MG CAPS Take 10 mg by mouth at bedtime.   pantoprazole (PROTONIX) 40 MG tablet Take 1 tablet (40 mg total) by mouth 2 (two) times daily.   ramelteon (ROZEREM) 8 MG tablet TAKE 1 TABLET (8 MG TOTAL) BY MOUTH AT BEDTIME AS NEEDED FOR SLEEP. TAKE 30 MINUTES BEFORE BEDTIME.   traZODone (DESYREL) 100 MG tablet TAKE 1 TABLET BY MOUTH EVERYDAY AT BEDTIME   [DISCONTINUED] busPIRone (BUSPAR) 7.5 MG tablet Take 2 tablets (15 mg total) by mouth 2 (two) times daily. You may increase to 15 mg twice daily after 1 week if not effective enough.   [DISCONTINUED] metoprolol succinate (TOPROL-XL) 25 MG 24 hr tablet TAKE 1 TABLET (25 MG TOTAL) BY MOUTH DAILY.   [DISCONTINUED] potassium chloride (KLOR-CON) 10 MEQ tablet Take 1 tablet (10 mEq total) by mouth daily.   No facility-administered encounter medications on file as of 07/05/2022.     Review of Systems  Review of Systems  No chest  pain with exertion.  No orthopnea or PND.  Comprehensive review of systems otherwise negative. Physical Exam  BP 124/68 (BP Location: Left Arm, Patient Position: Sitting, Cuff Size: Normal)   Pulse 71   Ht 5\' 4"  (1.626 m)   Wt 162 lb 12.8 oz (73.8 kg)   SpO2 97%   BMI 27.94 kg/m   Wt Readings from Last 5 Encounters:  08/06/22 164 lb 8 oz (74.6 kg)  07/24/22 160 lb (72.6 kg)  07/05/22 162 lb 12.8 oz (73.8 kg)  06/18/22 147 lb (66.7 kg)  04/20/22 152 lb 12.8 oz (69.3 kg)    BMI Readings from Last 5 Encounters:  08/06/22 28.24 kg/m  07/24/22 27.46 kg/m  07/05/22 27.94 kg/m  06/18/22 26.04 kg/m  04/20/22 26.23 kg/m     Physical Exam General: Sitting in chair, no acute distress Eyes: EOMI, no icterus Neck: Supple, no JVP Pulmonary: Clear, normal work of breathing Cardiovascular: Warm, no edema Abdomen: Nondistended, bowel sounds present MSK: No synovitis, no joint effusion Neuro: Normal gait, no weakness Psych: Normal mood, full affect   Assessment & Plan:   Dyspnea on exertion: High suspicion for uncontrolled asthma.  Mild emphysema on prior chest imaging.  Possibly contributing.  She has quit smoking.  PFTs for further evaluation.  Chest x-ray today for further evaluation.  Cough: Suspicious for poorly controlled asthma.  See below.  Chest x-ray as above.  Asthma: Suspect poorly controlled.  Escalate regimen to triple inhaled therapy via Breztri given exacerbations and need for prednisone over the last several months.  Trial of levalbuterol, jitteriness, palpitations on albuterol.  PFTs as above.   Return in about 3 months (around 10/04/2022) for f/u Dr. Judeth Horn, PFT.   Karren Burly, MD 08/16/2022

## 2022-08-16 NOTE — Patient Instructions (Addendum)
Nice to see you again  I am glad the Markus Daft is helping  For the cough worse over the last few days, use prednisone 20 mg once a day for 5 days  Please send a message next week if things or not improving  I tried to see if you would be eligible for a co-pay card for [place], with [facility] it may not be allowed.  But it is probably with a shot  Google "Breztri zero pay" and you can fill out the form to see if you are eligible, it should send you a form that you can take to the pharmacy.  Return to clinic in 6 months or sooner as needed with Dr. Judeth Horn

## 2022-08-20 ENCOUNTER — Ambulatory Visit: Admitting: Surgical

## 2022-08-20 DIAGNOSIS — M75102 Unspecified rotator cuff tear or rupture of left shoulder, not specified as traumatic: Secondary | ICD-10-CM | POA: Diagnosis not present

## 2022-08-22 ENCOUNTER — Encounter: Payer: Self-pay | Admitting: Surgical

## 2022-08-22 MED ORDER — METHYLPREDNISOLONE ACETATE 40 MG/ML IJ SUSP
40.0000 mg | INTRAMUSCULAR | Status: AC | PRN
Start: 2022-08-20 — End: 2022-08-20
  Administered 2022-08-20: 40 mg via INTRA_ARTICULAR

## 2022-08-22 MED ORDER — BUPIVACAINE HCL 0.5 % IJ SOLN
14.0000 mL | INTRAMUSCULAR | Status: AC | PRN
Start: 2022-08-20 — End: 2022-08-20
  Administered 2022-08-20: 14 mL via INTRA_ARTICULAR

## 2022-08-22 MED ORDER — LIDOCAINE HCL 1 % IJ SOLN
5.0000 mL | INTRAMUSCULAR | Status: AC | PRN
Start: 2022-08-20 — End: 2022-08-20
  Administered 2022-08-20: 5 mL

## 2022-08-22 NOTE — Progress Notes (Signed)
Office Visit Note   Patient: Kimberly Terrell           Date of Birth: 02-01-1976           MRN: [id number] Visit Date: 08/20/2022 Requested by: [name], MD [address] [place],  [place] [address] PCP: [name], MD  Subjective: Chief Complaint  Patient presents with   Left Shoulder - Pain    HPI: Kimberly Terrell is a 47 y.o. female who presents to the office for MRI review.  MRI arthrogram was ordered by Mary-Anne persons PA-C.  Patient states that she has a history of left shoulder pain that has been ongoing for about a year without any known history of injury.  She states she feels like she just woke up 1 day with her shoulder hurting quite a bit.  She localizes pain to the lateral aspect of the shoulder with radiation to the elbow fairly commonly.  She will have occasional radiation to her hands but this only happens probably about once every 2 to 3 weeks.  She has no trapezius or scapular pain.  No neck pain.  No numbness or tingling or any burning quality to her pain.  She will occasionally have flareups that are associated with position she puts her shoulder in and these flareups will last anywhere from several hours to a couple days.  She states that she takes Tylenol for pain and she cannot take NSAIDs due to a history of gastric ulcer perforation.  Does have a little bit of right shoulder pain but nowhere near the left shoulder pain she has.  She has no history of prior surgery to her shoulder or neck.  No weakness that she can really recall.  When her pain flares up she really cannot lift her arm overhead.  She has no relief with lifting her arm overhead at any time.  No history of diabetes or thyroid disease.  Pain is not worse with coughing or sneezing.  She has had prior subacromial injection by Clerance Lav and she really cannot recall if this gave her any significant relief for any amount of time.  MRI results revealed: MR Shoulder Left w/  contrast  Result Date: 08/10/2022 CLINICAL DATA:  Left shoulder pain that comes and goes for 1 year. EXAM: MRI OF THE LEFT SHOULDER WITH CONTRAST TECHNIQUE: Multiplanar, multisequence MR imaging of the left shoulder was performed following the administration of intra-articular contrast. CONTRAST:  See Injection Documentation. COMPARISON:  left shoulder radiographs 06/10/2022 FINDINGS: Rotator cuff: There is a fluid bright partial-thickness midsubstance tear of the mid to anterior aspect of the supraspinatus tendon footprint measuring up to 4 mm in transverse dimension (coronal series 13, images 7 and 8) and 8 mm in AP dimension (sagittal series 7, image 13). No tendon retraction. There is mild underlying supraspinatus intermediate T2 signal tendinosis. The infraspinatus, subscapularis, and teres minor are intact. Muscles: No rotator cuff muscle atrophy, fatty infiltration, or edema. Biceps long head: The intra-articular long head of the biceps tendon is intact. Acromioclavicular Joint: Normal alignment of the clavicular joint with minimal joint space narrowing and peripheral osteophytosis. Type 2 acromion. No subacromial/subdeltoid bursitis. Glenohumeral Joint: Mild glenohumeral cartilage thinning. Labrum: There is adequate distention of the glenohumeral joint following arthrogram injection. There is mild smooth extension of gadolinium contrast into the posterosuperior chondrolabral junction (axial series 3 images 8 through 11) favored to represent the normal chondrolabral contour. No definite glenoid labral tear is seen. Bones: No acute fracture. Mild  subcortical cystic changes within the humeral head deep to the anterior supraspinatus tendon footprint tear described above. Other: None. IMPRESSION: 1. Partial-thickness midsubstance tear of the mid to anterior supraspinatus tendon footprint measuring up to 4 mm in transverse dimension and 8 mm in AP dimension. No tendon retraction. 2. Minimal acromioclavicular  osteoarthritis. Electronically Signed   By: Neita Garnet M.D.   On: 08/10/2022 16:19                 ROS: All systems reviewed are negative as they relate to the chief complaint within the history of present illness.  Patient denies fevers or chills.  Assessment & Plan: Visit Diagnoses:  1. Nontraumatic tear of left supraspinatus tendon     Plan: Kimberly Terrell is a 47 y.o. female who presents to the office for review of MRI scan and evaluation of shoulder pain.  She has MRI demonstrating partial-thickness supraspinatus tear without retraction or atrophy.  Also has a little bit of AC joint arthritis.  Due to the constant nature and severity of her symptoms, hard to say if this partial-thickness tear is responsible for the vast majority of her pain.  Another thought would be maybe she has some cervical pathology such as foraminal stenosis that could be contributing to pain in the shoulder region.  She really cannot recall if she got any relief from subacromial injection that she had several months ago.  After we reviewed the MRI scan and discussed options for Kimberly Terrell, I recommended diagnostic Marcaine injection into the subacromial space today.  She had 100% relief of symptoms upon reexamination about 5 minutes later with no pain with passive motion of the shoulder and no pain with rotator cuff strength testing.  She did continue to have a little bit of weakness of infraspinatus relative to the contralateral side.  Seems that based on this, her shoulder is actually the main driver of pain.  So we discussed further options such as working with physical therapy versus repeat cortisone injection versus discussion of surgery with Dr. August Saucer.  She would like to try repeat cortisone injection to see if second injection will have more effect.  This was administered without complication and patient tolerated procedure well.  Will plan for her to follow-up for clinical recheck with Dr. August Saucer in 4  weeks.  Follow-Up Instructions: No follow-ups on file.   Orders:  No orders of the defined types were placed in this encounter.  No orders of the defined types were placed in this encounter.     Procedures: Large Joint Inj: L subacromial bursa on 08/20/2022 9:29 AM Indications: diagnostic evaluation and pain Details: 18 G 1.5 in needle, posterior approach  Arthrogram: No  Medications: 14 mL bupivacaine 0.5 %; 40 mg methylPREDNISolone acetate 40 MG/ML; 5 mL lidocaine 1 % Outcome: tolerated well, no immediate complications Procedure, treatment alternatives, risks and benefits explained, specific risks discussed. Consent was given by the patient. Immediately prior to procedure a time out was called to verify the correct patient, procedure, equipment, support staff and site/side marked as required. Patient was prepped and draped in the usual sterile fashion.       Clinical Data: No additional findings.  Objective: Vital Signs: LMP 07/10/2022 (Approximate)   Physical Exam:  Constitutional: Patient appears well-developed HEENT:  Head: Normocephalic Eyes:EOM are normal Neck: Normal range of motion Cardiovascular: Normal rate Pulmonary/chest: Effort normal Neurologic: Patient is alert Skin: Skin is warm Psychiatric: Patient has normal mood and affect  Ortho Exam: Ortho  exam demonstrates left shoulder and right shoulder both with 90 degrees external rotation, 115 degrees abduction, 170 degrees forward elevation passively and actively.  She has positive Neer sign with reproduction of pain with lifting her arm overhead.  Positive Hawkins sign.  She has mild to moderate tenderness over the bicipital groove as well as the [facility] joint.  Negative external rotation lag sign.  Negative Hornblower sign.  She has excellent strength of supraspinatus and subscapularis rated 5/5.  She does have weakness of infraspinatus rated 4/5.  This reproduces a lot of her pain along with lifting her arm  overhead.  She has intact EPL, FPL, finger abduction, grip strength testing, pronation/supination, bicep, tricep with strength rated 5/5 bilaterally.  Axillary nerve is intact with deltoid firing.  Negative Spurling sign.  Negative Lhermitte sign.  Full active range of motion of the cervical spine without reproduction of symptoms.  Specialty Comments:  No specialty comments available.  Imaging: No results found.   PMFS History: Patient Active Problem List   Diagnosis Date Noted   Colon polyps 08/09/2022   Hyperkalemia 08/09/2022   History of diarrhea 04/06/2022   Colon cancer screening 04/06/2022   History of Helicobacter pylori infection 04/06/2022   Hemorrhoids 04/06/2022   Insomnia 03/09/2022   Pruritic dermatitis 02/04/2022   Acute heart failure (HCC) 01/04/2022   Thrombocytosis 01/04/2022   Demand ischemia    Perforated viscus 12/04/2021   Gastric ulcer 12/04/2021   Morton's neuroma of right foot 09/23/2021   Bunion of left foot 09/23/2021   Controlled substance agreement broken 04/09/2020   Benzodiazepine dependence (HCC) 04/09/2020   Shoulder pain, bilateral 02/27/2020   Asthma 10/30/2019   Chronic hip pain 10/30/2019   TOA (tubo-ovarian abscess) 07/04/2019   IUD infection (HCC) 07/04/2019   Gastroesophageal reflux disease without esophagitis 05/04/2019   H. pylori infection 03/28/2019   Abnormal CT of the abdomen 03/28/2019   Chronic daily headache 10/12/2017   Allergic rhinitis 07/25/2017   Depression, recurrent (HCC) 04/04/2017   Migraine 04/04/2017   Bursitis of right hip 04/04/2017   GAD (generalized anxiety disorder) 04/04/2017   DUB (dysfunctional uterine bleeding) 04/04/2017   Past Medical History:  Diagnosis Date   Anxiety    Arthritis    Asthma    Bursitis    Right Hip and Right Knee   Depression    H. pylori infection    s/p triple therapy x 2   Migraines     Family History  Problem Relation Age of Onset   Depression Mother    Depression  Daughter    Colon cancer Neg Hx    Esophageal cancer Neg Hx    Gastric cancer Neg Hx    Inflammatory bowel disease Neg Hx    Liver disease Neg Hx    Pancreatic cancer Neg Hx    Rectal cancer Neg Hx    Stomach cancer Neg Hx     Past Surgical History:  Procedure Laterality Date   COSMETIC SURGERY  2008   breast implants    gastric ulcer surgery     gastric ulcer perforation surgery   LAPAROTOMY N/A 12/04/2021   Procedure: EXPLORATORY LAPAROTOMY;  Surgeon: Harriette Bouillon, MD;  Location: [facility] OR;  Service: General;  Laterality: N/A;   Social History   Occupational History   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, [employer]  Tobacco Use   Smoking status: Former    Packs/day: 1    Types: Cigarettes    Quit date: 12/03/2021    Years since  quitting: 0.7   Smokeless tobacco: Never   Tobacco comments:    Trying   Vaping Use   Vaping Use: Never used  Substance and Sexual Activity   Alcohol use: No   Drug use: No   Sexual activity: Yes    Birth control/protection: I.U.D., Surgical    Comment: husband has had a vasectomy

## 2022-08-24 ENCOUNTER — Telehealth: Payer: Self-pay | Admitting: Cardiovascular Disease

## 2022-08-24 NOTE — Telephone Encounter (Signed)
STAT if HR is under 50 or over 120 (normal HR is 60-100 beats per minute)  What is your heart rate? 135   Do you have a log of your heart rate readings (document readings)? N/A   Do you have any other symptoms? Patient's HR is having issues regulating and would like to speak with someone to see if there should be a change.

## 2022-08-24 NOTE — Telephone Encounter (Signed)
Patient called because she states her heart rate has been increasing over the last three weeks. Its been up in the 130's. When this happening she gets short of breath and feels dizzy like she is going to pass out. Currently her blood pressure is 106/70 and her heart rate is at 80. She states she can be relaxing and doing absolutely nothing and her heart rate will increase. She is taking metoprolol 25mg  daily as prescribed. I did advise if heart rate gets above 120 and she is having shortness of breath, dizziness and or chest pain she needs to call 911. She will continue to monitor heart rate. Will forward to provider for more advice

## 2022-08-25 ENCOUNTER — Ambulatory Visit (INDEPENDENT_AMBULATORY_CARE_PROVIDER_SITE_OTHER): Admitting: Student

## 2022-08-25 DIAGNOSIS — Z1231 Encounter for screening mammogram for malignant neoplasm of breast: Secondary | ICD-10-CM

## 2022-08-25 DIAGNOSIS — J019 Acute sinusitis, unspecified: Secondary | ICD-10-CM | POA: Insufficient documentation

## 2022-08-25 NOTE — Telephone Encounter (Signed)
Patient's husband is following up due to not hearing back from anyone yesterday.

## 2022-08-25 NOTE — Telephone Encounter (Signed)
She keeps having "episodes" of heart rate increasing up to 145- stays hight for about a minute or so, then go back down. Can be up to 12 times in a day.  Seemed to be leveling out with the Metoprolol, but over the last few weeks it has gotten worse again; with several episodes. She has sob and dizziness with the episodes.   She was taken to the ER a few weeks ago and it was caught on EKG- should be able to review it- Pt was just laying there and heart rate increasing- spiking. Several EKG's were obtained.  08/12/22- EKG- Sinus Tach (144), Ventricular Bigeminy, Borderline Right axis deviation and Borderline T abnormalities   Had a Complete CT of abdomen while there and no abnormalities shown   Pt and husband Would really like Micah Flesher, PA to review all of this information- would like a follow up with her, but nothing available "quickly" and pt needs to be seen soon due to her symptoms. They are just frustrated with being seen by so many different providers and getting so many different opinions. Would really like Continuity of Care.   Informed that I would send all of this information to Micah Flesher, [place] and also send this info to the provider that they are scheduled to see on Friday for review.    Given ER precautions. Verbalized understanding.

## 2022-08-25 NOTE — Progress Notes (Signed)
  [facility] Internal Medicine Residency Telephone Encounter Continuity Care Appointment  HPI:  This telephone encounter was created for [name] on 08/25/2022 for the following purpose/cc sinus infection. Duration? Nasal congestion?  Facial pain or pressure? Headache?  Changes in vision?  Eye pain?  Neck stiffness?  Fever?   Patient states that beginning about 5d ago she began having nasal congestion, yellow colored nasal discharge, small cough, and sinus pressure. She denies fevers, chills, or wheezing. She also denies worsening of her chronic headaches, eye pain, or neck stiffness.   She takes claritin and uses a flonase nasal spray daily for her allergic rhinitis but states these have not helped her symptoms. She also tried mucinex and sudafed to minimal effect.    Past Medical History:  Past Medical History:  Diagnosis Date   Anxiety    Arthritis    Asthma    Bursitis    Right Hip and Right Knee   Depression    H. pylori infection    s/p triple therapy x 2   Migraines      ROS:  Negative except per above.    Assessment / Plan / Recommendations:  Please see A&P under problem oriented charting for assessment of the patient's acute and chronic medical conditions.  As always, pt is advised that if symptoms worsen or new symptoms arise, they should go to an urgent care facility or to to ER for further evaluation.   Consent and Medical Decision Making:  Patient discussed with Dr. Heide Spark This is a telephone encounter between Kimberly Terrell and [employer] on 08/25/2022 for sinus infection. The visit was conducted with the patient located at home and Marolyn Haller at [facility]. The patient's identity was confirmed using their DOB and current address. The patient has consented to being evaluated through a telephone encounter and understands the associated risks (an examination cannot be done and the patient may need to come in for an appointment) / benefits  (allows the patient to remain at home, decreasing exposure to coronavirus). I personally spent 20 minutes on medical discussion.

## 2022-08-25 NOTE — Assessment & Plan Note (Signed)
Patient with 5d of symptoms with no fever or alarm symptoms of neck stiffness or eye pain. She does note some purulent appearing nasal discharge.   Discussed with patient that she should trial a sinus rinse and continue with her claritin, flonase, and mucinex. Also discussed that she could trial a short course of afrin nasal spray at night for her symptoms. If this does not improve her symptoms then she could contact us and we would prescribe a short course of antibiotics.   Patient states she is hesitant to try an additional nasal spray. And she noted she would like to go to an urgent care if her symptoms do not improve.

## 2022-08-26 ENCOUNTER — Other Ambulatory Visit: Payer: Self-pay

## 2022-08-26 DIAGNOSIS — F411 Generalized anxiety disorder: Secondary | ICD-10-CM

## 2022-08-26 NOTE — Telephone Encounter (Signed)
Left voicemail for patient to return call to office. 

## 2022-08-26 NOTE — Telephone Encounter (Signed)
Patient will keep appointment with [place] for tomorrow. She is currently asymptomatic.

## 2022-08-27 ENCOUNTER — Ambulatory Visit: Attending: Student

## 2022-08-27 ENCOUNTER — Encounter: Payer: Self-pay | Admitting: *Deleted

## 2022-08-27 ENCOUNTER — Ambulatory Visit: Attending: Student | Admitting: Student

## 2022-08-27 ENCOUNTER — Encounter: Payer: Self-pay | Admitting: Student

## 2022-08-27 VITALS — BP 124/72 | HR 74 | Ht 64.0 in | Wt 168.6 lb

## 2022-08-27 DIAGNOSIS — R002 Palpitations: Secondary | ICD-10-CM

## 2022-08-27 DIAGNOSIS — R000 Tachycardia, unspecified: Secondary | ICD-10-CM

## 2022-08-27 NOTE — Patient Instructions (Signed)
Medication Instructions:  No Changes In Medications at this time.   *If you need a refill on your cardiac medications before your next appointment, please call your pharmacy*  Lab Work: None Ordered At This Time.   If you have labs (blood work) drawn today and your tests are completely normal, you will receive your results only by: MyChart Message (if you have MyChart) OR A paper copy in the mail If you have any lab test that is abnormal or we need to change your treatment, we will call you to review the results.  Testing/Procedures:  Kimberly Terrell- Long Term Monitor Instructions   Your physician has requested you wear your ZIO patch monitor___14____days.   This is a single patch monitor.  [employer] supplies one patch monitor per enrollment.  Additional stickers are not available.   Please do not apply patch if you will be having a Nuclear Stress Test, Echocardiogram, Cardiac CT, MRI, or Chest Xray during the time frame you would be wearing the monitor. The patch cannot be worn during these tests.  You cannot remove and re-apply the ZIO XT patch monitor.   Your ZIO patch monitor will be sent USPS Priority mail from [facility] directly to your home address. The monitor may also be mailed to a PO BOX if home delivery is not available.   It may take 3-5 days to receive your monitor after you have been enrolled.   Once you have received you monitor, please review enclosed instructions.  Your monitor has already been registered assigning a specific monitor serial # to you.   Applying the monitor   Shave hair from upper left chest.   Hold abrader disc by orange tab.  Rub abrader in 40 strokes over left upper chest as indicated in your monitor instructions.   Clean area with 4 enclosed alcohol pads .  Use all pads to assure are is cleaned thoroughly.  Let dry.   Apply patch as indicated in monitor instructions.  Patch will be place under collarbone on left side of chest with arrow pointing  upward.   Rub patch adhesive wings for 2 minutes.Remove white label marked "1".  Remove white label marked "2".  Rub patch adhesive wings for 2 additional minutes.   While looking in a mirror, press and release button in center of patch.  A small green light will flash 3-4 times .  This will be your only indicator the monitor has been turned on.     Do not shower for the first 24 hours.  You may shower after the first 24 hours.   Press button if you feel a symptom. You will hear a small click.  Record Date, Time and Symptom in the Patient Log Book.   When you are ready to remove patch, follow instructions on last 2 pages of Patient Log Book.  Stick patch monitor onto last page of Patient Log Book.   Place Patient Log Book in [place] box.  Use locking tab on box and tape box closed securely.  The [employer] and [employer] has [employer] on it.  Please place in mailbox as soon as possible.  Your physician should have your test results approximately 7 days after the monitor has been mailed back to [facility].   Call [facility] Customer Care at [phone] if you have questions regarding your ZIO XT patch monitor.  Call them immediately if you see an orange light blinking on your monitor.   If your monitor falls off  in less than 4 days contact our Monitor department at [phone].  If your monitor becomes loose or falls off after 4 days call [employer] at [phone] for suggestions on securing your monitor.   Follow-Up: At [facility], you and your health needs are our priority.  As part of our continuing mission to provide you with exceptional heart care, we have created designated Provider Care Teams.  These Care Teams include your primary Cardiologist (physician) and Advanced Practice Providers (APPs -  Physician Assistants and Nurse Practitioners) who all work together to provide you with the care you need, when you need it.  Your next appointment:   ON SAME DAY AS  ECHO  Provider:   Charlton Haws, MD   OR APP

## 2022-08-27 NOTE — Progress Notes (Signed)
Patient enrolled for [employer] to ship a 14 day holter monitor with a bridge and 51m Red Dot Repositionable electrodes, (patient requested sensitive skin electrodes).  Dr. Eden Emms to read.

## 2022-08-27 NOTE — Progress Notes (Signed)
Cardiology Clinic Note   Date: 08/27/2022 ID: Kimberly Terrell, Lindh 03-21-1976, MRN [id number]  Primary Cardiologist:  Charlton Haws, MD  Patient Profile    Kimberly Terrell. Kimberly Terrell is a 47 y.o. female who presents to the clinic today for evaluation of tachycardia.   Past medical history significant for: Demand ischemia. Systolic heart failure. Echo 02/03/2022: EF 55%.  Normal RV function.  Moderate MR. Palpitations. GAD. Asthma. Tobacco abuse. Complete cessation September 2023.   History of Present Illness    Kimberly Terrell was previously seen by Dr. Johney Frame in the latter part of 2021.  Patient was first evaluated by Dr. Eden Emms on 12/04/2021 during hospitalization for peritonitis.  Patient was admitted to the hospital for peritonitis with perforated peptic ulcer requiring exploratory laparoscopy on 12/04/2021.  Cardiology was consulted for sinus tachycardia and elevated troponin.  Initial echo showed EF 40 to 45% with distal septal, apical and inferior basal hypokinesis highly suspicious for stress-induced cardiomyopathy.  Repeat limited echo showed recovery of EF to 55 to 60% with no RWMA.  Outpatient coronary CTA showed calcium score of 0 and no evidence of CAD.  Upon office follow-up patient reported constant shortness of breath and a feeling like something was sitting on her chest.  She just returned from a 3-day cruise and symptoms were felt to be attributed to dietary indiscretion.  BNP was slightly elevated and she took a 5-day course of Lasix.  Patient was last seen in the office on 04/20/2022 with complaints of DOE and occasional lipid tingling with breathing fast.  She was referred to pulmonology.  An echo was ordered but it does not appear to have been completed.  Patient contacted the office on 08/25/2022 with complaints of palpitations/tachycardia: "She keeps having "episodes" of heart rate increasing up to 145- stays hight for about a minute or so, then go back down. Can be up to 12  times in a day.  Seemed to be leveling out with the Metoprolol, but over the last few weeks it has gotten worse again; with several episodes. She has sob and dizziness with the episodes.   She was taken to the ER a few weeks ago and it was caught on EKG- should be able to review it- Pt was just laying there and heart rate increasing- spiking. Several EKG's were obtained.  08/12/22- EKG- Sinus Tach (144), Ventricular Bigeminy, Borderline Right axis deviation and Borderline T abnormalities.  Had a Complete CT of abdomen while there and no abnormalities shown.  Pt and husband Would really like Micah Flesher, PA to review all of this information- would like a follow up with her, but nothing available "quickly" and pt needs to be seen soon due to her symptoms. They are just frustrated with being seen by so many different providers and getting so many different opinions. Would really like Continuity of Care.   Informed that I would send all of this information to Micah Flesher, [place] and also send this info to the provider that they are scheduled to see on Friday for review.    Given ER precautions. Verbalized understanding."  Today, patient is accompanied by her husband.  Patient reports increased episodes of tachycardia.  Previously they were occurring with exertion however in the last 3 to 4 weeks she has been having episodes when she is at rest.  Heart rate will be >120 BPM for 1 to 5 minutes.  Sometimes she will only have a couple of episodes in a day and other times  she will have dozens.  When episodes occur she can hyperventilate and get tingling in her fingertips and around her lips.  She has tried to wear a ZIO monitor in the past and was unable to tolerate it secondary to her reaction to the adhesive.  Just discussing repeating the ZIO is causing patient to feel anxious secondary to concern about reacting to the adhesive again.  Patient denies chest pain, lower extremity edema, shortness of breath.    ROS:  All other systems reviewed and are otherwise negative except as noted in History of Present Illness.  Studies Reviewed    ECG personally reviewed by me today: NSR, 74 bpm.  Series of EKGs on 08/12/2022 (during hospitalization) show sinus rhythm, sinus tach with ventricular bigeminy.          Physical Exam    VS:  BP 124/72 (BP Location: Left Arm, Patient Position: Sitting, Cuff Size: Normal)   Pulse 74   Ht 5\' 4"  (1.626 m)   Wt 168 lb 9.6 oz (76.5 kg)   SpO2 97%   BMI 28.94 kg/m  , BMI Body mass index is 28.94 kg/m.  GEN: Well nourished, well developed, in no acute distress. Neck: No JVD or carotid bruits. Cardiac:  RRR. No murmurs. No rubs or gallops.   Respiratory:  Respirations regular and unlabored. Clear to auscultation without rales, wheezing or rhonchi. GI: Soft, nontender, nondistended. Extremities: Radials/DP/PT 2+ and equal bilaterally. No clubbing or cyanosis. No edema.  Skin: Warm and dry, no rash. Neuro: Strength intact.  Assessment & Plan    Palpitations/sinus tachycardia.  Patient has a several year history of palpitations and sinus tachycardia.  She has had recent increase.  Episodes are now happening at rest and lasting 1 to 5 minutes.  There is no pattern to episodes it can happen just a few times a day or dozens.  She does have episodes every day.  Will get a 2-week ZIO (specific for sensitive skin).  EKG today shows normal sinus rhythm, 74 bpm.  Continue metoprolol.  Discussed taking an extra half of metoprolol for sustained tachycardia.  Disposition: 2-week ZIO.  Return in 6 to 8 weeks or sooner as needed.         Signed, Etta Grandchild. Rhia Blatchford, DNP, NP-C

## 2022-08-30 ENCOUNTER — Ambulatory Visit: Admitting: Physician Assistant

## 2022-08-30 NOTE — Progress Notes (Unsigned)
Patient enrolled for [employer] to ship a 14 day holter monitor with sensitive skin setup.  Dr. Eden Emms to read.

## 2022-08-30 NOTE — Progress Notes (Signed)
Internal Medicine Clinic Attending  Case discussed with Dr. Carter  At the time of the visit.  We reviewed the resident's history and exam and pertinent patient test results.  I agree with the assessment, diagnosis, and plan of care documented in the resident's note.  

## 2022-09-01 ENCOUNTER — Telehealth: Payer: Self-pay | Admitting: Cardiovascular Disease

## 2022-09-01 NOTE — Telephone Encounter (Signed)
Husband called stating patient received 2 heart monitors today - Zio XT and Body Guardian.  Husband wants call back to determine which monitor patient should use for sensitive skin.

## 2022-09-01 NOTE — Telephone Encounter (Signed)
Instructed husband how to mail back the ZIO XT in the box with prepaid return postage on it.  The ZIO XT is not for sensitive skin.  We could not stop the shipment in time. The [employer] will have the sensitive skin alternative in the box.

## 2022-09-03 ENCOUNTER — Telehealth: Payer: Self-pay

## 2022-09-03 DIAGNOSIS — R002 Palpitations: Secondary | ICD-10-CM

## 2022-09-03 NOTE — Telephone Encounter (Signed)
-----   Message from Kimberly Levering, NP sent at 09/02/2022 12:25 PM EDT ----- We need to see if we can get this patient a follow up with EP on the day she has her echo. Eden Emms wants her to be seen by EP. She currently has a visit scheduled with Tereso Newcomer on that day. Can we talk to someone about this to ensure it can happen on that same day?  Thank you!  DW

## 2022-09-03 NOTE — Telephone Encounter (Signed)
Patient has been scheduled to see Dr Nelly Laurence at 11am the same day as her echo. The patient has been made aware of her appointments and gave a verbal understanding.

## 2022-09-06 ENCOUNTER — Ambulatory Visit (INDEPENDENT_AMBULATORY_CARE_PROVIDER_SITE_OTHER): Admitting: Student

## 2022-09-06 DIAGNOSIS — J019 Acute sinusitis, unspecified: Secondary | ICD-10-CM

## 2022-09-07 ENCOUNTER — Institutional Professional Consult (permissible substitution): Payer: Self-pay | Admitting: Licensed Clinical Social Worker

## 2022-09-07 NOTE — Assessment & Plan Note (Addendum)
After clinic visit on Aug 25, 2022, patient went to the urgent care and was prescribed amoxicillin for symptoms.  She states that after taking this medication she is doing well, and has no acute concerns or complaints.  She denies any fevers, chills, shortness of breath, nasal discharge.

## 2022-09-07 NOTE — Progress Notes (Signed)
   CC: [name]  This is a telephone encounter between [name] and [name] on 09/07/2022 for rhino sinusitis followup. The visit was conducted with the patient located at home and [name] at [facility]. The patient's identity was confirmed using their DOB and current address. The patient has consented to being evaluated through a telephone encounter and understands the associated risks (an examination cannot be done and the patient may need to come in for an appointment) / benefits (allows the patient to remain at home, decreasing exposure to coronavirus). I personally spent 10 minutes on medical discussion.   HPI:  [name] is a 47 y.o. with PMH as below.   Please see A&P for assessment of the patient's acute and chronic medical conditions.   Past Medical History:  Diagnosis Date   Anxiety    Arthritis    Asthma    Bursitis    Right Hip and Right Knee   Depression    H. pylori infection    s/p triple therapy x 2   Migraines    Review of Systems:  Negative except stated in assessment and plan    Assessment & Plan:   Acute rhinosinusitis After clinic visit on Aug 25, 2022, patient went to the urgent care and was prescribed amoxicillin for symptoms.  She states that after taking this medication she is doing well, and has no acute concerns or complaints.  She denies any fevers, chills, shortness of breath, nasal discharge.   Patient discussed with Dr. Baird Lyons Kadeen Sroka Internal Medicine Resident

## 2022-09-10 ENCOUNTER — Telehealth: Payer: Self-pay | Admitting: Student

## 2022-09-10 ENCOUNTER — Ambulatory Visit

## 2022-09-10 NOTE — Telephone Encounter (Signed)
aNew Message:    Please call, monitor have come off.,. She says her skin is on fire.  . She says she needs to talk to somebody today please

## 2022-09-15 ENCOUNTER — Other Ambulatory Visit: Payer: Self-pay

## 2022-09-15 ENCOUNTER — Ambulatory Visit: Admitting: Orthopedic Surgery

## 2022-09-15 DIAGNOSIS — G5761 Lesion of plantar nerve, right lower limb: Secondary | ICD-10-CM | POA: Diagnosis not present

## 2022-09-15 DIAGNOSIS — M7741 Metatarsalgia, right foot: Secondary | ICD-10-CM

## 2022-09-15 NOTE — Telephone Encounter (Signed)
Patient's husband is calling again to discuss monitor. Requesting return call as soon as possible.

## 2022-09-15 NOTE — Telephone Encounter (Signed)
Spoke to the pt husband Barbara Cower DPR, APP order Zio XT for 14 days. Pt husband stated he called on 6/14 to report his wife had removed the patch due to developing a rash and skin was "bubbling" around the site. As of today, pt skin is healing, she will mail off the device tomorrow. Will forward to APP.

## 2022-09-18 ENCOUNTER — Encounter: Payer: Self-pay | Admitting: Orthopedic Surgery

## 2022-09-18 MED ORDER — LIDOCAINE HCL 1 % IJ SOLN
2.0000 mL | INTRAMUSCULAR | Status: AC | PRN
Start: 2022-09-15 — End: 2022-09-15
  Administered 2022-09-15: 2 mL

## 2022-09-18 MED ORDER — BUPIVACAINE HCL 0.25 % IJ SOLN
0.6600 mL | INTRAMUSCULAR | Status: AC | PRN
Start: 2022-09-15 — End: 2022-09-15
  Administered 2022-09-15: .66 mL

## 2022-09-18 MED ORDER — METHYLPREDNISOLONE ACETATE 40 MG/ML IJ SUSP
10.0000 mg | INTRAMUSCULAR | Status: AC | PRN
Start: 2022-09-15 — End: 2022-09-15
  Administered 2022-09-15: 10 mg

## 2022-09-18 NOTE — Progress Notes (Signed)
Office Visit Note   Patient: Kimberly Terrell. Lau           Date of Birth: 12/14/1975           MRN: [id number] Visit Date: 09/15/2022 Requested by: [name], MD [address] [place],  [place] [address] PCP: [name], MD  Subjective: Chief Complaint  Patient presents with   Left Shoulder - Follow-up    HPI: Kimberly Terrell is a 47 y.o. female who presents to the office reporting left shoulder pain.  She did have a subacromial injection 08/20/2022 which got her 75% better.  She reports decreased pain and increased range of motion.  Not taking any medication for the shoulder.  Still is somewhat reluctant to pick things up.  She cannot take anti-inflammatories because of a perforated ulcer.  She also reports a second webspace Morton's neuroma.  Old notes were reviewed and that has been injected last year with good result.  She would like to have that injected again today..                ROS: All systems reviewed are negative as they relate to the chief complaint within the history of present illness.  Patient denies fevers or chills.  Assessment & Plan: Visit Diagnoses:  1. Metatarsalgia, right foot     Plan: Impression is left shoulder pain improved with a subacromial injection.  Strength and range of motion are also good on exam.  No further intervention required for the shoulder.  She has a cruise/vacation coming up and her distal MTP related pain is aggravating her.  Under ultrasound guidance today that interspace between the metatarsal heads 2 and 3 is injected and we will see how she does with that.  Follow-up with [place] as needed.  Follow-Up Instructions: No follow-ups on file.   Orders:  Orders Placed This Encounter  Procedures   US Guided Needle Placement - No Linked Charges   No orders of the defined types were placed in this encounter.     Procedures: Foot Inj  Date/Time: 09/15/2022 3:45 PM  Performed by: Cammy Copa, MD Authorized  by: Cammy Copa, MD   Consent Given by:  Patient Indications:  Neuroma Condition: Morton's Neuroma   Location:  R foot Needle Size:  22 G Approach:  Dorsal Medications:  2 mL lidocaine 1 %; 0.66 mL bupivacaine 0.25 %; 10 mg methylPREDNISolone acetate 40 MG/ML    Clinical Data: No additional findings.  Objective: Vital Signs: There were no vitals taken for this visit.  Physical Exam:  Constitutional: Patient appears well-developed HEENT:  Head: Normocephalic Eyes:EOM are normal Neck: Normal range of motion Cardiovascular: Normal rate Pulmonary/chest: Effort normal Neurologic: Patient is alert Skin: Skin is warm Psychiatric: Patient has normal mood and affect  Ortho Exam: Ortho exam demonstrates range of motion on the left shoulder of 70/95/170.  Rotator cuff strength is intact infraspinatus supraspinatus and subscap muscle testing.  No masses lymphadenopathy or skin changes noted in that left shoulder girdle region with no crepitus with internal/external rotation at 90 degrees of abduction.  AC joint nontender.  Neck range of motion full.  Right second MTP joint is mildly tender with positive compression test.  Really the pain is more between the metatarsal heads.  Slight splaying of the toes 2 and 3.  MTP joints are reduced.  Specialty Comments:  No specialty comments available.  Imaging: No results found.   PMFS History: Patient Active Problem List  Diagnosis Date Noted   Acute rhinosinusitis 08/25/2022   Colon polyps 08/09/2022   Hyperkalemia 08/09/2022   History of diarrhea 04/06/2022   Colon cancer screening 04/06/2022   History of Helicobacter pylori infection 04/06/2022   Hemorrhoids 04/06/2022   Insomnia 03/09/2022   Pruritic dermatitis 02/04/2022   Acute heart failure (HCC) 01/04/2022   Thrombocytosis 01/04/2022   Demand ischemia    Perforated viscus 12/04/2021   Gastric ulcer 12/04/2021   Morton's neuroma of right foot 09/23/2021   Bunion of  left foot 09/23/2021   Controlled substance agreement broken 04/09/2020   Benzodiazepine dependence (HCC) 04/09/2020   Shoulder pain, bilateral 02/27/2020   Asthma 10/30/2019   Chronic hip pain 10/30/2019   TOA (tubo-ovarian abscess) 07/04/2019   IUD infection (HCC) 07/04/2019   Gastroesophageal reflux disease without esophagitis 05/04/2019   H. pylori infection 03/28/2019   Abnormal CT of the abdomen 03/28/2019   Chronic daily headache 10/12/2017   Allergic rhinitis 07/25/2017   Depression, recurrent (HCC) 04/04/2017   Migraine 04/04/2017   Bursitis of right hip 04/04/2017   GAD (generalized anxiety disorder) 04/04/2017   DUB (dysfunctional uterine bleeding) 04/04/2017   Past Medical History:  Diagnosis Date   Anxiety    Arthritis    Asthma    Bursitis    Right Hip and Right Knee   Depression    H. pylori infection    s/p triple therapy x 2   Migraines     Family History  Problem Relation Age of Onset   Depression Mother    Depression Daughter    Colon cancer Neg Hx    Esophageal cancer Neg Hx    Gastric cancer Neg Hx    Inflammatory bowel disease Neg Hx    Liver disease Neg Hx    Pancreatic cancer Neg Hx    Rectal cancer Neg Hx    Stomach cancer Neg Hx     Past Surgical History:  Procedure Laterality Date   COSMETIC SURGERY  2008   breast implants    gastric ulcer surgery     gastric ulcer perforation surgery   LAPAROTOMY N/A 12/04/2021   Procedure: EXPLORATORY LAPAROTOMY;  Surgeon: Harriette Bouillon, MD;  Location: [facility] OR;  Service: General;  Laterality: N/A;   Social History   Occupational History   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, advertising  Tobacco Use   Smoking status: Former    Packs/day: 1    Types: Cigarettes    Quit date: 12/03/2021    Years since quitting: 0.7   Smokeless tobacco: Never   Tobacco comments:    Trying   Vaping Use   Vaping Use: Never used  Substance and Sexual Activity   Alcohol use: No   Drug use: No   Sexual activity: Yes    Birth  control/protection: I.U.D., Surgical    Comment: husband has had a vasectomy

## 2022-09-21 ENCOUNTER — Other Ambulatory Visit: Payer: Self-pay | Admitting: Student

## 2022-09-21 DIAGNOSIS — L308 Other specified dermatitis: Secondary | ICD-10-CM

## 2022-09-22 ENCOUNTER — Ambulatory Visit (INDEPENDENT_AMBULATORY_CARE_PROVIDER_SITE_OTHER): Admitting: Licensed Clinical Social Worker

## 2022-09-22 DIAGNOSIS — F411 Generalized anxiety disorder: Secondary | ICD-10-CM

## 2022-09-22 NOTE — BH Specialist Note (Signed)
Integrated Behavioral Health via Telemedicine Visit  09/22/2022 Shernell Saldierna. Cowie [id number]  Number of Integrated Behavioral Health Clinician visits: 1- Initial Visit  Session Start time: 1500   Session End time: 1536  Total time in minutes: 36   Referring Provider: Inez Catalina, MD Patient/Family location: Home [facility] Provider location: Office All persons participating in visit: [facility] and Patient Types of Service: Telephone visit, General Behavioral Integrated Care (BHI), and Introduction only  I connected with Raynell R. [name] via  Psychologist, clinical  (Video is Surveyor, mining) and verified that I am speaking with the correct person using two identifiers. Discussed confidentiality: Yes   I discussed the limitations of telemedicine and the availability of in person appointments.  Discussed there is a possibility of technology failure and discussed alternative modes of communication if that failure occurs.  I discussed that engaging in this telemedicine visit, they consent to the provision of behavioral healthcare and the services will be billed under their insurance.  Patient and/or legal guardian expressed understanding and consented to Telemedicine visit: Yes   Presenting Concerns: Patient and/or family reports the following symptoms/concerns: Depression, Anxiety and anger  Duration of problem: 5 years; Severity of problem: severe  Patient and/or Family's Strengths/Protective Factors: Sense of purpose  Goals Addressed: Patient will:  Reduce symptoms of: anxiety, depression, and mood instability   Progress towards Goals: Ongoing  Interventions: Interventions utilized:  Motivational Interviewing, Solution-Focused Strategies, Mindfulness or Management consultant, and Supportive Reflection Standardized Assessments completed: PHQ-SADS    03/05/2022   10:46 AM 02/04/2022   10:16 AM 02/04/2022   10:11 AM  PHQ-SADS Last 3 Score only   PHQ Adolescent Score 0 8 8       Assessment: Patient currently experiencing anxiety, depression and mood instability.   Patients household consist of; Patients spouse, Mother in law, adult daughter, adult daughters 103 and 73 year old.  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    Patient admits to becoming frustrated when mother in law and daughter are in her presence.   [facility] and patient agreed to work on "Emotional regulation". [facility] and patient identified patients triggers. Patient enjoys music and finds comfort in music. Patient will listen to music prior to any interaction with her mother in law or adult daughter.   Patient has resources and ability [place] Patient will plan travel for mountains.   Patient expressed taking frustration out on spouse. Patient will work on processing frustration before interacting with spouse.     Patient may benefit from Ongoing therapy.  Plan: Follow up with behavioral health clinician on : Within 30 days.    I discussed the assessment and treatment plan with the patient and/or parent/guardian. They were provided an opportunity to ask questions and all were answered. They agreed with the plan and demonstrated an understanding of the instructions.   They were advised to call back or seek an in-person evaluation if the symptoms worsen or if the condition fails to improve as anticipated.  Christen Butter, MSW, LCSW-A She/Her Behavioral Health Clinician [facility]  Internal [facility] Center Direct Dial:[phone]  Fax [phone] Main Office Phone: [phone] [address] [place] [place], [place] [address] Website: [facility] Internal Medicine [facility]  [place], [place]  [place]

## 2022-09-22 NOTE — BH Specialist Note (Deleted)
Integrated Behavioral Health Initial In-Person Visit  MRN: [id number] Name: Kimberly Terrell. Kimberly Terrell  Number of Integrated Behavioral Health Clinician visits: 1- Initial Visit  Session Start time: 1500    Session End time: 1536  Total time in minutes: 36   Types of Service: {CHL AMB TYPE OF [phone]}  Interpretor:{yes NW:[id number]} Interpretor Name and Language: ***   Warm Hand Off Completed.        Subjective: Kimberly Terrell. Menden is a 47 y.o. female accompanied by {CHL AMB ACCOMPANIED HY:[id number]} Patient was referred by *** for ***. Patient reports the following symptoms/concerns: *** Duration of problem: ***; Severity of problem: {Mild/Moderate/Severe:20260}  Objective: Mood: {BHH MOOD:22306} and Affect: {BHH AFFECT:22307} Risk of harm to self or others: {CHL AMB BH Suicide Current Mental Status:21022748}  Life Context: Family and Social: *** School/Work: *** Self-Care: *** Life Changes: ***  Patient and/or Family's Strengths/Protective Factors: {CHL AMB BH PROTECTIVE FACTORS:[phone]}  Goals Addressed: Patient will: Reduce symptoms of: {IBH Symptoms:21014056} Increase knowledge and/or ability of: {IBH Patient Tools:21014057}  Demonstrate ability to: {IBH Goals:21014053}  Progress towards Goals: {CHL AMB BH PROGRESS TOWARDS [phone]}  Interventions: Interventions utilized: {IBH Interventions:21014054}  Standardized Assessments completed: {IBH Screening Tools:21014051}  Patient and/or Family Response: ***  Patient Centered Plan: Patient is on the following Treatment Plan(s):  ***  Assessment: Patient currently experiencing ***.   Patient may benefit from ***.  Plan: Follow up with behavioral health clinician on : *** Behavioral recommendations: *** Referral(s): {IBH Referrals:21014055} "From scale of 1-10, how likely are you to follow plan?": ***  Kimberly Terrell

## 2022-09-23 ENCOUNTER — Telehealth: Payer: Self-pay | Admitting: Cardiovascular Disease

## 2022-09-23 NOTE — Telephone Encounter (Signed)
Heart monitor came with a card in it. She forgot to return the card in it. Advised he does not need to bring to her next appointment.  He can disregard.

## 2022-09-23 NOTE — Telephone Encounter (Signed)
Pt states they forgot to turn in the report card and needs to know if they need to send it back or bring it with them. Please advise.

## 2022-09-29 NOTE — Progress Notes (Signed)
This has been an issue she has been having. She can take the extra dose of metoprolol if they are sustained. From what her monitor showed these episodes are brief. EP is the next step so unfortunately will need to await visit with Dr. Nelly Laurence.  Thank you!  DW

## 2022-10-13 ENCOUNTER — Ambulatory Visit (INDEPENDENT_AMBULATORY_CARE_PROVIDER_SITE_OTHER): Admitting: Licensed Clinical Social Worker

## 2022-10-13 DIAGNOSIS — F411 Generalized anxiety disorder: Secondary | ICD-10-CM

## 2022-10-13 NOTE — BH Specialist Note (Signed)
Integrated Behavioral Health Follow Up In-Person Visit  MRN: [id number] Name: Kimberly Terrell. [name]  Number of Integrated Behavioral Health Clinician visits: 2- Second Visit  Session Start time: 1330   Session End time: 1430  Total time in minutes: 60   Types of Service: Individual psychotherapy and General Behavioral Integrated Care (BHI)  Interpretor:No. Interpretor Name and Language: N/A  Subjective: Kimberly Terrell is a 47 y.o. female accompanied by Spouse Patient was referred by PCP for Anxiety. Patient reports the following symptoms/concerns: Patient in office with spouse on today. Patient and spouse discussed daughter "Fara Boros" transitioning gender roles while parenting a 73 and 46 year old.  Patient and spouse discussed upcoming trips.   Duration of problem: More than 5 years; Severity of problem: moderate  Objective: Mood: NA and Affect: Appropriate Risk of harm to self or others: No plan to harm self or others  Life Context: Family and Social: Married with children School/Work: patient is employed at a caregiver for [employer] Self-Care: patient continues to work on a plan Life Changes: Major surgery  Patient and/or Family's Strengths/Protective Factors: Concrete supports in place (healthy food, safe environments, etc.) and Caregiver has knowledge of parenting & child development  Goals Addressed: Patient will:  Reduce symptoms of: anxiety   Increase knowledge and/or ability of: coping skills   Demonstrate ability to: Increase healthy adjustment to current life circumstances  Progress towards Goals: Ongoing  Interventions: Interventions utilized:  Motivational Interviewing, Solution-Focused Strategies, and Mindfulness or Relaxation Training Standardized Assessments completed: PHQ-SADS     03/05/2022   10:46 AM 02/04/2022   10:16 AM 02/04/2022   10:11 AM  PHQ-SADS Last 3 Score only  PHQ Adolescent Score 0 8 8     Patient and/or Family Response: Patient agrees  to continue therapy.  Patient Centered Plan: Patient is on the following Treatment Plan(s):  Reduce symptoms of: anxiety   Increase knowledge and/or ability of: coping skills   Demonstrate ability to: Increase healthy adjustment to current life circumstances Assessment: Patient currently experiencing Anxiety.   Patient may benefit from ongoing therapy.  Plan: Follow up with behavioral health clinician on : within the next 30 days.  I discussed the assessment and treatment plan with the patient and/or parent/guardian. They were provided an opportunity to ask questions and all were answered. They agreed with the plan and demonstrated an understanding of the instructions.   They were advised to call back or seek an in-person evaluation if the symptoms worsen or if the condition fails to improve as anticipated.  Christen Butter, MSW, LCSW-A She/Her Behavioral Health Clinician [facility]  Internal Medicine Center Direct [phone]  Fax [phone] Main Office Phone: [phone] [address] [place] [place], [place] [address] Website: [facility] Internal Medicine [facility]  [place], [place]  [place]

## 2022-10-15 ENCOUNTER — Ambulatory Visit (HOSPITAL_COMMUNITY): Attending: Physician Assistant

## 2022-10-15 ENCOUNTER — Ambulatory Visit: Admitting: Physician Assistant

## 2022-10-15 ENCOUNTER — Encounter: Payer: Self-pay | Admitting: Cardiovascular Disease

## 2022-10-15 ENCOUNTER — Ambulatory Visit: Admitting: Cardiovascular Disease

## 2022-10-15 VITALS — BP 120/70 | HR 88 | Ht 64.0 in | Wt 167.4 lb

## 2022-10-15 DIAGNOSIS — R0609 Other forms of dyspnea: Secondary | ICD-10-CM | POA: Diagnosis present

## 2022-10-15 DIAGNOSIS — R002 Palpitations: Secondary | ICD-10-CM

## 2022-10-15 DIAGNOSIS — R0600 Dyspnea, unspecified: Secondary | ICD-10-CM | POA: Insufficient documentation

## 2022-10-15 DIAGNOSIS — J4530 Mild persistent asthma, uncomplicated: Secondary | ICD-10-CM | POA: Insufficient documentation

## 2022-10-15 DIAGNOSIS — I5181 Takotsubo syndrome: Secondary | ICD-10-CM | POA: Diagnosis not present

## 2022-10-15 DIAGNOSIS — E785 Hyperlipidemia, unspecified: Secondary | ICD-10-CM | POA: Insufficient documentation

## 2022-10-15 DIAGNOSIS — I4729 Other ventricular tachycardia: Secondary | ICD-10-CM | POA: Diagnosis not present

## 2022-10-15 DIAGNOSIS — R000 Tachycardia, unspecified: Secondary | ICD-10-CM

## 2022-10-15 DIAGNOSIS — Z87891 Personal history of nicotine dependence: Secondary | ICD-10-CM | POA: Insufficient documentation

## 2022-10-15 LAB — ECHOCARDIOGRAM COMPLETE
Area-P 1/2: 2.95 cm2
Height: 64 in
MV M vel: 5.28 m/s
MV Peak grad: 111.5 mmHg
Radius: 0.6 cm
S' Lateral: 3 cm
Weight: 2678.4 oz

## 2022-10-15 MED ORDER — MAGNESIUM 400 MG PO TABS: 400.0000 mg | ORAL_TABLET | Freq: Every day | ORAL | 3 refills | Status: DC

## 2022-10-15 MED ORDER — METOPROLOL SUCCINATE ER 50 MG PO TB24: 50.0000 mg | ORAL_TABLET | Freq: Every day | ORAL | 3 refills | Status: DC

## 2022-10-15 NOTE — Patient Instructions (Signed)
Medication Instructions:  INCREASE Metoprolol Succinate to 50 mg once daily START Magnesium Taurate 400 mg once daily  *If you need a refill on your cardiac medications before your next appointment, please call your pharmacy*   Follow-Up: At [facility], you and your health needs are our priority.  As part of our continuing mission to provide you with exceptional heart care, we have created designated Provider Care Teams.  These Care Teams include your primary Cardiologist (physician) and Advanced Practice Providers (APPs -  Physician Assistants and Nurse Practitioners) who all work together to provide you with the care you need, when you need it.  We recommend signing up for the patient portal called "MyChart".  Sign up information is provided on this After Visit Summary.  MyChart is used to connect with patients for Virtual Visits (Telemedicine).  Patients are able to view lab/test results, encounter notes, upcoming appointments, etc.  Non-urgent messages can be sent to your provider as well.   To learn more about what you can do with MyChart, go to [email or web address].    Your next appointment:   1 month(s)  Provider:   York Pellant, MD

## 2022-10-15 NOTE — Progress Notes (Signed)
  Electrophysiology Office Note:    Date:  10/15/2022   ID:  Kimberly Terrell R. Lenor, Provencher 1975-11-06, MRN [id number]  PCP:  [name], MD   [facility] Providers Cardiologist:  Charlton Haws, MD     Referring MD: [name],*   History of Present Illness:    Kimberly Maxim. Terrell is a 47 y.o. 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     She was admitted in September 2023 with peritonitis due to a perforated ulcer complicated by NSTEMI due to stress-induced cardiomyopathy. EF was 40-45%. She was discharged on GDMT. Repeat TTE in November showed that her EF had normalized. There was moderate MR.     She feels particularly bad when she feels elevated heart rates. Metoprolol has helped some  EKGs/Labs/Other Studies Reviewed Today:    Echocardiogram:   TTE 02/03/2022 EF 55% (improved from 40-45% in November). Moderate MR   Monitors:  14d Research scientist (medical) - my interpretation Sinus rhythm HR 53-214, avg 80 bpm 8% PVCs.  There were very brief atrial runs and NSVT. PVCs occurred in bigeminy. Multiple symptom events were associated with PVCs  Stress testing:   Advanced imaging:  CT coronary 01/05/2022 Normal coronary arteries  Cardiac catherization    EKG:       Sinus rhythm with frequent interpolated PVCs of multiple morphologies   Physical Exam:    VS:  BP 120/70   Pulse 88   Ht 5\' 4"  (1.626 m)   Wt 167 lb 6.4 oz (75.9 kg)   SpO2 99%   BMI 28.73 kg/m     Wt Readings from Last 3 Encounters:  10/15/22 167 lb 6.4 oz (75.9 kg)  08/27/22 168 lb 9.6 oz (76.5 kg)  08/16/22 165 lb 14.2 oz (75.2 kg)     GEN: Well nourished, well developed in no acute distress CARDIAC: RRR, no murmurs, rubs, gallops RESPIRATORY:  Normal work of breathing MUSCULOSKELETAL: no edema    ASSESSMENT & PLAN:    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 Try magnesium taurate -- stop if she has diarrhea  History of stress-induced cardiomyopathy with recovered EF EF 40-45% in setting of sepsis from perforated ulcer; now 55%     Signed, Maurice Small, MD  10/15/2022 11:39 AM    [facility]

## 2022-11-05 ENCOUNTER — Telehealth: Payer: Self-pay | Admitting: Orthopedic Surgery

## 2022-11-05 NOTE — Telephone Encounter (Signed)
Patient called and ask if there is any pain medication that he can prescribe for her right foot. She can barely walk. CB#[phone]

## 2022-11-08 ENCOUNTER — Telehealth: Payer: Self-pay | Admitting: Orthopedic Surgery

## 2022-11-08 ENCOUNTER — Other Ambulatory Visit: Payer: Self-pay | Admitting: Surgical

## 2022-11-08 MED ORDER — GABAPENTIN 100 MG PO CAPS: 200.0000 mg | ORAL_CAPSULE | Freq: Three times a day (TID) | ORAL | 0 refills | Status: DC | PRN

## 2022-11-08 NOTE — Telephone Encounter (Signed)
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

## 2022-11-08 NOTE — Telephone Encounter (Signed)
Kimberly Terrell   NN   11/05/22 11:19 AM Note Patient called and ask if there is any pain medication that he can prescribe for her right foot. She can barely walk. [phone]       Message does not look like it was forwarded to anyone on Friday.  She called in the afternoon to check on Rx.

## 2022-11-08 NOTE — Telephone Encounter (Signed)
Called and advised.

## 2022-11-15 ENCOUNTER — Telehealth: Payer: Self-pay

## 2022-11-15 ENCOUNTER — Encounter: Payer: Self-pay | Admitting: Cardiovascular Disease

## 2022-11-15 ENCOUNTER — Ambulatory Visit: Attending: Cardiovascular Disease | Admitting: Cardiovascular Disease

## 2022-11-15 ENCOUNTER — Ambulatory Visit (INDEPENDENT_AMBULATORY_CARE_PROVIDER_SITE_OTHER): Admitting: Licensed Clinical Social Worker

## 2022-11-15 VITALS — BP 120/78 | HR 63 | Ht 64.0 in | Wt 172.0 lb

## 2022-11-15 DIAGNOSIS — R002 Palpitations: Secondary | ICD-10-CM

## 2022-11-15 DIAGNOSIS — I5181 Takotsubo syndrome: Secondary | ICD-10-CM

## 2022-11-15 DIAGNOSIS — F411 Generalized anxiety disorder: Secondary | ICD-10-CM

## 2022-11-15 DIAGNOSIS — I4729 Other ventricular tachycardia: Secondary | ICD-10-CM | POA: Diagnosis not present

## 2022-11-15 DIAGNOSIS — R000 Tachycardia, unspecified: Secondary | ICD-10-CM

## 2022-11-15 NOTE — Progress Notes (Signed)
Electrophysiology Office Note:    Date:  11/15/2022   ID:  Kimberly Terrell, Kimberly Terrell 10-06-1975, MRN [id number]  PCP:  [name], MD   [facility] Providers Cardiologist:  Charlton Haws, MD Electrophysiologist:  Maurice Small, MD     Referring MD: [name],*   History of Present Illness:    Kimberly Terrell is a 47 y.o. 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     She was admitted in September 2023 with peritonitis due to a perforated ulcer complicated by NSTEMI due to stress-induced cardiomyopathy. EF was 40-45%. She was discharged on GDMT. Repeat TTE in November showed that her EF had normalized. There was moderate MR.     She feels particularly bad when she feels elevated heart rates. Metoprolol has helped some; and her symptoms resolved with the increased dose of metoprolol.  EKGs/Labs/Other Studies Reviewed Today:    Echocardiogram:   TTE 02/03/2022 EF 55% (improved from 40-45% in November). Moderate MR   Monitors:  14d Research scientist (medical) - my interpretation Sinus rhythm HR 53-214, avg 80 bpm 8% PVCs.  There were very brief atrial runs and NSVT. PVCs occurred in bigeminy. Multiple symptom events were associated with PVCs  Stress testing:   Advanced imaging:  CT coronary 01/05/2022 Normal coronary arteries  Cardiac catherization    EKG:   EKG Interpretation Date/Time:  Monday November 15 2022 15:22:32 EDT Ventricular Rate:  63 PR Interval:  136 QRS Duration:  76 QT Interval:  424 QTC Calculation: 433 R Axis:   103  Text Interpretation: Normal sinus rhythm Rightward axis Nonspecific ST abnormality When compared with ECG of 15-Oct-2022 11:31, Premature ventricular complexes are no longer Present Confirmed by York Pellant [phone]) on 11/15/2022 4:03:33 PM   EKG  Interpretation Date/Time:  Monday November 15 2022 15:22:32 EDT Ventricular Rate:  63 PR Interval:  136 QRS Duration:  76 QT Interval:  424 QTC Calculation: 433 R Axis:   103  Text Interpretation: Normal sinus rhythm Rightward axis Nonspecific ST abnormality When compared with ECG of 15-Oct-2022 11:31, Premature ventricular complexes are no longer Present Confirmed by York Pellant [phone]) on 11/15/2022 4:03:33 PM    Physical Exam:    VS:  BP 120/78 (BP Location: Left Arm, Patient Position: Sitting, Cuff Size: Normal)   Pulse 63   Ht 5\' 4"  (1.626 m)   Wt 172 lb (78 kg)   SpO2 97%   BMI 29.52 kg/m     Wt Readings from Last 3 Encounters:  11/15/22 172 lb (78 kg)  10/15/22 167 lb 6.4 oz (75.9 kg)  08/27/22 168 lb 9.6 oz (76.5 kg)     GEN: Well nourished, well developed in no acute distress CARDIAC: RRR, no murmurs, rubs, gallops RESPIRATORY:  Normal work of breathing MUSCULOSKELETAL: no edema    ASSESSMENT & PLAN:    Frequent PVCs Burden is about 8% on monitor -- improved with increase metoprolol; symptoms have resolved PVCs were often clustered in episodes of bigeminy; brief NSVT She is not a good candidate for ablation with relatively low burden and multiple morphologies Continue metoprolol XL to 50 mg. If she has recurrence, she can try magnesium taurate   History of stress-induced cardiomyopathy with recovered EF EF 40-45% in setting of sepsis from perforated ulcer; now 55%  I will be happy to see her again in EP clinic if her symptoms recur.  Signed, Maurice Small, MD  11/15/2022 4:07 PM    [facility]

## 2022-11-15 NOTE — Telephone Encounter (Signed)
Patient called stating that she is still having right foot pain.  Stated that the gabapentin is not helping and would like to know if there is another Rx that can be sent to her pharmacy.  CB# [phone].  Please advise.  Thank you.

## 2022-11-15 NOTE — Telephone Encounter (Signed)
Do not really want to give anything narcotic route.  If Tylenol and ibuprofen and gabapentin are not helping then that is probably that all that we can do.  She could get another opinion with Dr. Lajoyce Corners since it is more in his [place].

## 2022-11-15 NOTE — BH Specialist Note (Signed)
Integrated Behavioral Health Follow Up In-Person Visit  MRN: [id number] Name: Kimberly Terrell. [name]  Number of Integrated Behavioral Health Clinician visits: 2- Second Visit  Session Start time: 1330   Session End time: 1430  Total time in minutes: 60   Types of Service: Health & Behavioral Assessment/Intervention  Interpretor:No. Interpretor Name and Language: N/A  Subjective: Kimberly Terrell is a 47 y.o. female accompanied by Spouse Patient was referred by PCP for Anxiety/ sleep concerns. Patient reports the following symptoms/concerns: Patient wishes to work on emotional regulation. 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 Patient is under a lot of stress, which impacts her mood and sleep.   Patient discussed childhood trauma on today.   Patient has a supportive spouse.   Patient has an upcoming cruise 09-17 thru 09-15.   Duration of problem: more than 5 years; Severity of problem: moderate  Objective: Mood: NA and Affect: Appropriate Risk of harm to self or others: No plan to harm self or others  Life Context: Family and Social: Spouse, children and grandchildren School/Work: caregiver Self-Care: traveling, hair, nails Life Changes: weight gain due to surgery  Patient and/or Family's Strengths/Protective Factors: Concrete supports in place (healthy food, safe environments, etc.)  Goals Addressed: Patient will:  Reduce symptoms of: anxiety, depression, and mood instability   Increase knowledge and/or ability of: coping skills   Demonstrate ability to: Increase healthy adjustment to current life circumstances  Progress towards Goals: Ongoing  Interventions: Interventions utilized:  Motivational Interviewing, Solution-Focused Strategies, and Sleep Hygiene Standardized Assessments completed: PHQ-SADS     03/05/2022   10:46 AM 02/04/2022   10:16 AM 02/04/2022   10:11 AM  PHQ-SADS Last 3 Score only  PHQ Adolescent Score 0 8 8      Patient and/or Family Response: Patient is enjoying therapy and wishes to continue.   Assessment: Patient currently experiencing Anxiety and depression.   Patient may benefit from ongoing therapy.  Plan: Follow up with behavioral health clinician on : 09/23 at 1:30pm in person  Christen Butter, MSW, LCSW-A She/Her Behavioral Health Clinician [facility]  Internal Medicine Center Direct Dial:[phone]  Fax [phone] Main Office Phone: [phone] [address] [place] [place], [place] [address] Website: [facility] Internal Medicine [facility]  [place], [place]  [place]

## 2022-11-15 NOTE — Patient Instructions (Signed)
Medication Instructions:  Your physician recommends that you continue on your current medications as directed. Please refer to the Current Medication list given to you today. *If you need a refill on your cardiac medications before your next appointment, please call your pharmacy*   Follow-Up: At [facility], you and your health needs are our priority.  As part of our continuing mission to provide you with exceptional heart care, we have created designated Provider Care Teams.  These Care Teams include your primary Cardiologist (physician) and Advanced Practice Providers (APPs -  Physician Assistants and Nurse Practitioners) who all work together to provide you with the care you need, when you need it.  We recommend signing up for the patient portal called "MyChart".  Sign up information is provided on this After Visit Summary.  MyChart is used to connect with patients for Virtual Visits (Telemedicine).  Patients are able to view lab/test results, encounter notes, upcoming appointments, etc.  Non-urgent messages can be sent to your provider as well.   To learn more about what you can do with MyChart, go to [email or web address].    Your next appointment:   Only As Needed   Provider:   York Pellant, MD

## 2022-11-16 NOTE — Telephone Encounter (Signed)
Called and advised pt. She stated understanding. She stated she will cb to schedule this appt with Dr. Lajoyce Corners

## 2022-11-17 ENCOUNTER — Ambulatory Visit: Admitting: Student

## 2022-11-17 VITALS — BP 112/61 | HR 63 | Temp 97.8°F | Ht 64.0 in | Wt 171.9 lb

## 2022-11-17 DIAGNOSIS — R252 Cramp and spasm: Secondary | ICD-10-CM | POA: Diagnosis not present

## 2022-11-17 DIAGNOSIS — F411 Generalized anxiety disorder: Secondary | ICD-10-CM

## 2022-11-17 DIAGNOSIS — F418 Other specified anxiety disorders: Secondary | ICD-10-CM

## 2022-11-17 DIAGNOSIS — G5761 Lesion of plantar nerve, right lower limb: Secondary | ICD-10-CM

## 2022-11-17 DIAGNOSIS — Z8639 Personal history of other endocrine, nutritional and metabolic disease: Secondary | ICD-10-CM

## 2022-11-17 DIAGNOSIS — G4700 Insomnia, unspecified: Secondary | ICD-10-CM | POA: Diagnosis not present

## 2022-11-17 DIAGNOSIS — Z131 Encounter for screening for diabetes mellitus: Secondary | ICD-10-CM

## 2022-11-17 DIAGNOSIS — I493 Ventricular premature depolarization: Secondary | ICD-10-CM | POA: Insufficient documentation

## 2022-11-17 MED ORDER — GABAPENTIN 300 MG PO CAPS
300.0000 mg | ORAL_CAPSULE | Freq: Three times a day (TID) | ORAL | 0 refills | Status: DC | PRN
Start: 2022-11-17 — End: 2022-11-25

## 2022-11-17 MED ORDER — BUSPIRONE HCL 10 MG PO TABS
20.0000 mg | ORAL_TABLET | Freq: Three times a day (TID) | ORAL | 2 refills | Status: DC
Start: 2022-11-17 — End: 2023-01-13

## 2022-11-17 MED ORDER — TRAZODONE HCL 100 MG PO TABS
150.0000 mg | ORAL_TABLET | Freq: Every day | ORAL | 2 refills | Status: DC
Start: 2022-11-17 — End: 2022-12-10

## 2022-11-17 NOTE — Assessment & Plan Note (Addendum)
Last steroid injection in June 2024 by Dr. August Saucer at [place]. 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 Patient discussed surgical options with orthopedic surgeons, but is not ready to commit to surgery.  - will trial higher dose of gabapentin today - patient to follow up with ortho for injection

## 2022-11-17 NOTE — Assessment & Plan Note (Signed)
Patient with worsening anxiety and depression. Current stressors include her daughter, who is in the process of transitioning and is also not caring for her children. 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 Her PHQ9 and GAD 7 scores have worsened since last seen in the clinic. She continues to see Christen Butter, but continues to deal with insomnia, worsening appetite, and difficulty concentrating. No SI/HI. Husband accompanies her today and reports her levels of anxiety have increased significantly as well. Her insomnia is associated with onset and maintenance, worsened by worries and frequent interruptions.   She was previously taking hydroxyzine but this did not help, so she discontinued it. She was also taking  - Continue Lexapro 20 mg daily -Increase Trazodone to 150 mg daily -Increase Buspar up to 20 mg TID -RTC in 1 month

## 2022-11-17 NOTE — Assessment & Plan Note (Signed)
Sustained muscle cramps. Mostly nocturnal, symmetrical, mostly in lower extremities.  No tenderness, abnormal reflexes, or skin changes on physical exam. Patient has a history of iron deficiency anemia. No claudication-like symptoms with exertion. Patient does have a history of bilateral feet neuropathic pain, but reports this is different  Will check BMP today for calcium and K levels. Will also obtain CBC and iron studies as patient has a low ferritin and low sat ratios in the past 12 months without iron supplementation. If initial blood work is normal, recommend TSH check. We discussed stretching and we are increasing Gabapentin for her neuropathic pain on R foot, which should also help. -CTM at follow up -Follow up labs today

## 2022-11-17 NOTE — Progress Notes (Signed)
Subjective:  CC: Medication management and depression follow up  HPI:  [name] is a 47 y.o. female with a past medical history stated below and presents today for medication management and depression. Please see problem based assessment and plan for additional details.  Past Medical History:  Diagnosis Date   Anxiety    Arthritis    Asthma    Bursitis    Right Hip and Right Knee   Depression    H. pylori infection    s/p triple therapy x 2   Migraines     Current Outpatient Medications on File Prior to Visit  Medication Sig Dispense Refill   fluticasone (FLONASE) 50 MCG/ACT nasal spray Place 1 spray into both nostrils daily.     acetaminophen (TYLENOL) 500 MG tablet Take 2 tablets (1,000 mg total) by mouth every 6 (six) hours as needed. 30 tablet 0   Acetaminophen-guaiFENesin (MUCINEX COLD & FLU) 325-200 MG CAPS Take 2 tablets by mouth daily as needed (cold symptoms).     albuterol (VENTOLIN HFA) 108 (90 Base) MCG/ACT inhaler Inhale 2 puffs into the lungs every 6 (six) hours as needed for wheezing or shortness of breath. 18 g 0   Budeson-Glycopyrrol-Formoterol (BREZTRI AEROSPHERE) 160-9-4.8 MCG/ACT AERO Inhale 2 puffs into the lungs in the morning and at bedtime. 10.7 g 5   diclofenac sodium (VOLTAREN) 1 % GEL Apply 2 g topically 4 (four) times daily. (Patient taking differently: Apply 2 g topically 4 (four) times daily as needed (pain).) 300 g 1   escitalopram (LEXAPRO) 10 MG tablet TAKE 2 TABLETS BY MOUTH EVERY DAY 180 tablet 1   hydrocortisone (ANUSOL-HC) 25 MG suppository Place 1 suppository (25 mg total) rectally daily. 7 suppository 1   levalbuterol (XOPENEX HFA) 45 MCG/ACT inhaler Inhale 2 puffs into the lungs every 6 (six) hours as needed for wheezing or shortness of breath. 1 each 12   Magnesium 400 MG TABS Take 400 mg by mouth daily. 90 tablet 3   Melatonin 10 MG CAPS Take 10 mg by mouth at bedtime.     metoprolol succinate (TOPROL-XL) 50 MG 24 hr tablet  Take 1 tablet (50 mg total) by mouth daily. Take with or immediately following a meal. 90 tablet 3   pantoprazole (PROTONIX) 40 MG tablet Take 1 tablet (40 mg total) by mouth 2 (two) times daily. 90 tablet 3   ramelteon (ROZEREM) 8 MG tablet TAKE 1 TABLET (8 MG TOTAL) BY MOUTH AT BEDTIME AS NEEDED FOR SLEEP. TAKE 30 MINUTES BEFORE BEDTIME. 90 tablet 1   No current facility-administered medications on file prior to visit.    Family History  Problem Relation Age of Onset   Depression Mother    Depression Daughter    Colon cancer Neg Hx    Esophageal cancer Neg Hx    Gastric cancer Neg Hx    Inflammatory bowel disease Neg Hx    Liver disease Neg Hx    Pancreatic cancer Neg Hx    Rectal cancer Neg Hx    Stomach cancer Neg Hx     Social History   Socioeconomic History   Marital status: Married    Spouse name: Calliana Hermansen   Number of children: 1   Years of education: Not on file   Highest education level: Not on file  Occupational History   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, advertising  Tobacco Use   Smoking status: Former    Current packs/day: 0.00    Types: Cigarettes  Quit date: 12/03/2021    Years since quitting: 0.9   Smokeless tobacco: Never   Tobacco comments:    Trying   Vaping Use   Vaping status: Never Used  Substance and Sexual Activity   Alcohol use: No   Drug use: No   Sexual activity: Yes    Birth control/protection: I.U.D., Surgical    Comment: husband has had a vasectomy  Other Topics Concern   Not on file  Social History Narrative   Right handed    Lives with [place]   Caffeine use: coffee-daily   Social Determinants of Health   Financial Resource Strain: Low Risk  (04/16/2021)   Received from [facility], [facility]   Overall Financial Resource Strain (CARDIA)    Difficulty of Paying Living Expenses: Not hard at all  Food Insecurity: No Food Insecurity (12/11/2021)   Hunger Vital Sign    Worried About Running Out of Food in the Last Year: Never true     Ran Out of Food in the Last Year: Never true  Transportation Needs: No Transportation Needs (12/11/2021)   PRAPARE - Administrator, Civil Service (Medical): No    Lack of Transportation (Non-Medical): No  Physical Activity: Sufficiently Active (10/31/2020)   Received from [facility], [facility]   Exercise Vital Sign    Days of Exercise per Week: 5 days    Minutes of Exercise per Session: 30 min  Stress: Stress Concern Present (10/31/2020)   Received from [facility], [facility] of Occupational Health - Occupational Stress Questionnaire    Feeling of Stress : To some extent  Social Connections: Moderately Integrated (10/31/2020)   Received from [facility], [facility]   Social Connection and Isolation Panel [NHANES]    Frequency of Communication with Friends and Family: Once a week    Frequency of Social Gatherings with Friends and Family: Once a week    Attends Religious Services: 1 to 4 times per year    Active Member of [employer] or Organizations: No    Attends Engineer, structural: 1 to 4 times per year    Marital Status: Married  Catering manager Violence: Not At Risk (12/11/2021)   Humiliation, Afraid, Rape, and Kick questionnaire    Fear of Current or Ex-Partner: No    Emotionally Abused: No    Physically Abused: No    Sexually Abused: No    Review of Systems: ROS negative except for what is noted on the assessment and plan.  Objective:   Vitals:   11/17/22 1330  BP: 112/61  Pulse: 63  Temp: 97.8 F (36.6 C)  TempSrc: Oral  SpO2: 100%  Weight: 171 lb 14.4 oz (78 kg)  Height: 5\' 4"  (1.626 m)    Physical Exam: Constitutional: well-appearing woman sitting in exam bed, in no acute distress HENT: normocephalic atraumatic, mucous membranes moist Eyes: conjunctiva non-erythematous Neck: supple Cardiovascular: regular rate and rhythm, no m/r/g Pulmonary/Chest: normal work of breathing on room air, lungs  clear to auscultation bilaterally Abdominal: soft, non-tender, non-distended MSK: normal bulk and tone Neurological: alert & oriented x 3, responding to questions appropriately Skin: warm and dry Psych: Pleasant mood and affect       11/17/2022    1:52 PM  Depression screen PHQ 2/9  Decreased Interest 2  Down, Depressed, Hopeless 2  PHQ - 2 Score 4  Altered sleeping 3  Tired, decreased energy  3  Change in appetite 2  Feeling bad or failure about yourself  2  Trouble concentrating 2  Moving slowly or fidgety/restless 0  Suicidal thoughts 0  PHQ-9 Score 16  Difficult doing work/chores Extremely dIfficult       11/17/2022    1:59 PM 10/22/2020   11:59 AM 07/08/2020   11:46 AM 01/31/2020   12:32 PM  GAD 7 : Generalized Anxiety Score  Nervous, Anxious, on Edge 2 2 1 1   Control/stop worrying 3 1 1 1   Worry too much - different things 3 1 1 1   Trouble relaxing 3 1 1 1   Restless 3 1 1  0  Easily annoyed or irritable 3 1 1 1   Afraid - awful might happen 0 0 0 0  Total GAD 7 Score 17 7 6 5   Anxiety Difficulty Extremely difficult Somewhat difficult Somewhat difficult Not difficult at all     Assessment & Plan:   Depression with anxiety Patient with worsening anxiety and depression. Current stressors include her daughter, who is in the process of transitioning and is also not caring for her children. 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 Her PHQ9 and GAD 7 scores have worsened since last seen in the clinic. She continues to see Christen Butter, but continues to deal with insomnia, worsening appetite, and difficulty concentrating. No SI/HI. Husband accompanies her today and reports her levels of anxiety have increased significantly as well. Her insomnia is associated with onset and maintenance, worsened by worries and frequent interruptions.   She was previously taking hydroxyzine but this did not help, so she discontinued it. She was also taking  - Continue Lexapro 20 mg  daily -Increase Trazodone to 150 mg daily -Increase Buspar up to 20 mg TID -RTC in 1 month   Leg cramping Sustained muscle cramps. Mostly nocturnal, symmetrical, mostly in lower extremities.  No tenderness, abnormal reflexes, or skin changes on physical exam. Patient has a history of iron deficiency anemia. No claudication-like symptoms with exertion. Patient does have a history of bilateral feet neuropathic pain, but reports this is different  Will check BMP today for calcium and K levels. Will also obtain CBC and iron studies as patient has a low ferritin and low sat ratios in the past 12 months without iron supplementation. If initial blood work is normal, recommend TSH check. We discussed stretching and we are increasing Gabapentin for her neuropathic pain on R foot, which should also help. -CTM at follow up -Follow up labs today  Morton's neuroma of right foot Last steroid injection in June 2024 by Dr. August Saucer at [place]. 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 Patient discussed surgical options with orthopedic surgeons, but is not ready to commit to surgery.  - will trial higher dose of gabapentin today - patient to follow up with ortho for injection   Return in about 4 weeks (around 12/15/2022) for Medication management and PAP smear.  Patient discussed with Dr. Koren Bound, MD [facility] Internal Medicine Program - PGY-2 11/17/2022, 9:18 PM

## 2022-11-17 NOTE — Patient Instructions (Addendum)
Thank you, [name] for allowing [place] to provide your care today. Today we discussed your depression, anxiety, and insomnia.    New medications: -none, but we have increased the dose of the Buspar - now 20 mg up to 3 times per day, gabapentin 300 mg three time per day, and trazadone 150 mg at night.  We are also checking your electrolytes and iron labs for your muscle cramps and screening you for diabetes  I have ordered the following labs for you:   Lab Orders         BMP8+Anion Gap         CBC no Diff         Iron, TIBC and Ferritin Panel      I will call if any are abnormal. All of your labs can be accessed through "My Chart".   My Chart Access: [email or web address]  Please follow-up in: 4-6 weeks for medication and symptom management     We look forward to seeing you next time. Please call our clinic at [phone] if you have any questions or concerns. The best time to call is Monday-Friday from 9am-4pm, but there is someone available 24/7. If after hours or the weekend, call the main hospital number and ask for the Internal Medicine Resident On-Call. If you need medication refills, please notify your pharmacy one week in advance and they will send [place] a request.   Thank you for letting us take part in your care. Wishing you the best!  [name], MD 11/17/2022, 2:22 PM [facility] Internal Medicine Resident, PGY-2

## 2022-11-18 ENCOUNTER — Telehealth: Payer: Self-pay

## 2022-11-18 ENCOUNTER — Telehealth: Payer: Self-pay | Admitting: *Deleted

## 2022-11-18 LAB — BMP8+ANION GAP
Anion Gap: 13 mmol/L (ref 10.0–18.0)
BUN/Creatinine Ratio: 13 (ref 9–23)
BUN: 10 mg/dL (ref 6–24)
CO2: 22 mmol/L (ref 20–29)
Calcium: 9.5 mg/dL (ref 8.7–10.2)
Chloride: 103 mmol/L (ref 96–106)
Creatinine, Ser: 0.79 mg/dL (ref 0.57–1.00)
Glucose: 104 mg/dL — ABNORMAL HIGH (ref 70–99)
Potassium: 4.3 mmol/L (ref 3.5–5.2)
Sodium: 138 mmol/L (ref 134–144)
eGFR: 93 mL/min/{1.73_m2} (ref >59)

## 2022-11-18 LAB — CBC
Hematocrit: 35.1 % (ref 34.0–46.6)
Hemoglobin: 10.7 g/dL — ABNORMAL LOW (ref 11.1–15.9)
MCH: 24 pg — ABNORMAL LOW (ref 26.6–33.0)
MCHC: 30.5 g/dL — ABNORMAL LOW (ref 31.5–35.7)
MCV: 79 fL (ref 79–97)
Platelets: 269 10*3/uL (ref 150–450)
RBC: 4.46 x10E6/uL (ref 3.77–5.28)
RDW: 15.1 % (ref 11.7–15.4)
WBC: 10 10*3/uL (ref 3.4–10.8)

## 2022-11-18 LAB — HEMOGLOBIN A1C
Est. average glucose Bld gHb Est-mCnc: 114 mg/dL
Hgb A1c MFr Bld: 5.6 % (ref 4.8–5.6)

## 2022-11-18 LAB — IRON,TIBC AND FERRITIN PANEL
Ferritin: 10 ng/mL — ABNORMAL LOW (ref 15–150)
Iron Saturation: 4 % — CL (ref 15–55)
Iron: 17 ug/dL — ABNORMAL LOW (ref 27–159)
Total Iron Binding Capacity: 453 ug/dL — ABNORMAL HIGH (ref 250–450)
UIBC: 436 ug/dL — ABNORMAL HIGH (ref 131–425)

## 2022-11-18 MED ORDER — FERROUS SULFATE 325 (65 FE) MG PO TABS: 325.0000 mg | ORAL_TABLET | ORAL | 2 refills | Status: DC

## 2022-11-18 NOTE — Telephone Encounter (Addendum)
FMLA paperwork has been completed and Faxed to Alphonzo Grieve, Risk Management @ [phone] and [phone]. Pt has been informed. Original copy will be left at the front desk. Copy will be scanned into pt chart.

## 2022-11-18 NOTE — Telephone Encounter (Signed)
Phone call to review lab results from [facility] 08/21. Notable for Hgb 10.7, iron panel indication iron deficiency with iron 453, UIBC 436, iron 17, saturation 4, ferritin 10. There is no contraindication to oral iron therapy in this patient.  Ferrous sulfate 325 mg every other day sent in to preferred pharmacy. Plan for recheck of iron studies in 6 weeks or so. Patient advised that iron supplements can cause upset stomach for some patients, and that she can try taking it with food if this occurs. She voices understanding and agreement with this plan.  Reviewed remainder of lab results with her as well including BMP, HbA1c. No further interventions at this time.  Champ Mungo, DO

## 2022-11-18 NOTE — Telephone Encounter (Signed)
Abnormal results from labs done on yesterday.  Results were given to Dr. August Saucer for follow up.

## 2022-11-18 NOTE — Progress Notes (Signed)
Dr. Champ Mungo discussed with patient and sent in order for Ferrous sulfate every other day

## 2022-11-22 NOTE — Progress Notes (Signed)
Internal Medicine Clinic Attending  Case discussed with the resident at the time of the visit.  We reviewed the resident's history and exam and pertinent patient test results.  I agree with the assessment, diagnosis, and plan of care documented in the resident's note.  

## 2022-11-25 ENCOUNTER — Other Ambulatory Visit (HOSPITAL_BASED_OUTPATIENT_CLINIC_OR_DEPARTMENT_OTHER): Payer: Self-pay

## 2022-11-25 ENCOUNTER — Encounter (HOSPITAL_BASED_OUTPATIENT_CLINIC_OR_DEPARTMENT_OTHER): Payer: Self-pay | Admitting: Student

## 2022-11-25 ENCOUNTER — Ambulatory Visit (HOSPITAL_BASED_OUTPATIENT_CLINIC_OR_DEPARTMENT_OTHER)

## 2022-11-25 ENCOUNTER — Ambulatory Visit (INDEPENDENT_AMBULATORY_CARE_PROVIDER_SITE_OTHER): Admitting: Student

## 2022-11-25 DIAGNOSIS — M5441 Lumbago with sciatica, right side: Secondary | ICD-10-CM | POA: Diagnosis not present

## 2022-11-25 DIAGNOSIS — M5442 Lumbago with sciatica, left side: Secondary | ICD-10-CM | POA: Diagnosis not present

## 2022-11-25 DIAGNOSIS — M79671 Pain in right foot: Secondary | ICD-10-CM

## 2022-11-25 DIAGNOSIS — G5761 Lesion of plantar nerve, right lower limb: Secondary | ICD-10-CM | POA: Diagnosis not present

## 2022-11-25 MED ORDER — GABAPENTIN 300 MG PO CAPS
300.0000 mg | ORAL_CAPSULE | Freq: Three times a day (TID) | ORAL | 0 refills | Status: AC
Start: 2022-11-25 — End: 2022-12-10
  Filled 2022-11-25: qty 45, 15d supply, fill #0

## 2022-11-25 MED ORDER — METHYLPREDNISOLONE 4 MG PO TBPK
ORAL_TABLET | ORAL | 0 refills | Status: DC
  Filled 2022-11-25: qty 21, 6d supply, fill #0

## 2022-11-25 MED ORDER — TRAMADOL HCL 50 MG PO TABS
50.0000 mg | ORAL_TABLET | Freq: Four times a day (QID) | ORAL | 0 refills | Status: AC | PRN
  Filled 2022-11-25: qty 20, 5d supply, fill #0

## 2022-11-25 NOTE — Progress Notes (Signed)
Chief Complaint: Right foot and low back pain     History of Present Illness:    Kimberly Terrell is a 47 y.o. female presenting today with pain in her right foot as well as her low back.  She has history of a Morton's neuroma in the second webspace of the right foot.  This was diagnosed last year and she has had multiple cortisone injections since then.  Last injection was performed by Dr. August Saucer on 09/15/2022 which gave her only some short-lived relief.  States that the pain in her foot is an 8/10.  Is taking gabapentin but is unsure if this is helping.  Also reports some low back pain that began recently.  Pain comes and goes however it will occasionally radiate down into both legs.  No significant numbness or tingling.  Has been taking Tylenol for this but denies any other treatments.  Unable to take NSAIDs due to history of gastric perforation.   Surgical History:   None  PMH/PSH/Family History/Social History/Meds/Allergies:    Past Medical History:  Diagnosis Date   Anxiety    Arthritis    Asthma    Bursitis    Right Hip and Right Knee   Depression    H. 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[employer], advertising  Tobacco Use   Smoking status: Former    Current packs/day: 0.00    Types: Cigarettes    Quit date: 12/03/2021    Years since quitting: 0.9   Smokeless tobacco: Never   Tobacco  comments:    Trying   Vaping Use   Vaping status: Never Used  Substance and Sexual Activity   Alcohol use: No   Drug use: No   Sexual activity: Yes    Birth control/protection: I.U.D., Surgical    Comment: husband has had a vasectomy  Other Topics Concern   Not on file  Social History Narrative   Right handed    Lives with [place]   Caffeine use: coffee-daily   Social Determinants of Health   Financial Resource Strain: Low Risk  (04/16/2021)   Received from [facility], [facility]   Overall Financial Resource Strain (CARDIA)    Difficulty of Paying Living Expenses: Not hard at all  Food Insecurity: No Food Insecurity (12/11/2021)   Hunger Vital Sign    Worried About Running Out of Food in the Last Year: Never true    Ran Out of Food in the Last Year: Never true  Transportation Needs: No Transportation Needs (12/11/2021)   PRAPARE - Administrator, Civil Service (Medical): No    Lack of Transportation (Non-Medical):  No  Physical Activity: Sufficiently Active (10/31/2020)   Received from [facility]   Exercise Vital Sign    Days of Exercise per Week: 5 days    Minutes of Exercise per Session: 30 min  Stress: Stress Concern Present (10/31/2020)   Received from [facility], [facility] of Occupational Health - Occupational Stress Questionnaire    Feeling of Stress : To some extent  Social Connections: Moderately Integrated (10/31/2020)   Received from [facility], [facility]   Social Connection and Isolation Panel [NHANES]    Frequency of Communication with Friends and Family: Once a week    Frequency of Social Gatherings with Friends and Family: Once a week    Attends Religious Services: 1 to 4 times per year    Active Member of [employer] or Organizations: No    Attends Engineer, structural: 1 to 4 times per year    Marital Status: Married   Family History  Problem Relation Age of Onset    Depression Mother    Depression Daughter    Colon cancer Neg Hx    Esophageal cancer Neg Hx    Gastric cancer Neg Hx    Inflammatory bowel disease Neg Hx    Liver disease Neg Hx    Pancreatic cancer Neg Hx    Rectal cancer Neg Hx    Stomach cancer Neg Hx    Allergies  Allergen Reactions   Imitrex [Sumatriptan] Other (See Comments)    "Gave me lockjaw"   Tape Other (See Comments)    The "plastic-like tape causes redness and irritation"   Aspirin Other (See Comments)    Caused chest pain   Current Outpatient Medications  Medication Sig Dispense Refill   methylPREDNISolone (MEDROL DOSEPAK) 4 MG TBPK tablet Take per packet instructions 21 each 0   traMADol (ULTRAM) 50 MG tablet Take 1 tablet (50 mg total) by mouth every 6 (six) hours as needed for up to 5 days. 20 tablet 0   acetaminophen (TYLENOL) 500 MG tablet Take 2 tablets (1,000 mg total) by mouth every 6 (six) hours as needed. 30 tablet 0   Acetaminophen-guaiFENesin (MUCINEX COLD & FLU) 325-200 MG CAPS Take 2 tablets by mouth daily as needed (cold symptoms).     albuterol (VENTOLIN HFA) 108 (90 Base) MCG/ACT inhaler Inhale 2 puffs into the lungs every 6 (six) hours as needed for wheezing or shortness of breath. 18 g 0   Budeson-Glycopyrrol-Formoterol (BREZTRI AEROSPHERE) 160-9-4.8 MCG/ACT AERO Inhale 2 puffs into the lungs in the morning and at bedtime. 10.7 g 5   busPIRone (BUSPAR) 10 MG tablet Take 2 tablets (20 mg total) by mouth 3 (three) times daily. 180 tablet 2   diclofenac sodium (VOLTAREN) 1 % GEL Apply 2 g topically 4 (four) times daily. (Patient taking differently: Apply 2 g topically 4 (four) times daily as needed (pain).) 300 g 1   escitalopram (LEXAPRO) 10 MG tablet TAKE 2 TABLETS BY MOUTH EVERY DAY 180 tablet 1   ferrous sulfate 325 (65 FE) MG tablet Take 1 tablet (325 mg total) by mouth every other day. 30 tablet 2   fluticasone (FLONASE) 50 MCG/ACT nasal spray Place 1 spray into both nostrils daily.     gabapentin  (NEURONTIN) 300 MG capsule Take 1 capsule (300 mg total) by mouth 3 (three) times daily for 15 days. Do not take this medication while driving 45 capsule 0   hydrocortisone (  ANUSOL-HC) 25 MG suppository Place 1 suppository (25 mg total) rectally daily. 7 suppository 1   levalbuterol (XOPENEX HFA) 45 MCG/ACT inhaler Inhale 2 puffs into the lungs every 6 (six) hours as needed for wheezing or shortness of breath. 1 each 12   Magnesium 400 MG TABS Take 400 mg by mouth daily. 90 tablet 3   Melatonin 10 MG CAPS Take 10 mg by mouth at bedtime.     metoprolol succinate (TOPROL-XL) 50 MG 24 hr tablet Take 1 tablet (50 mg total) by mouth daily. Take with or immediately following a meal. 90 tablet 3   pantoprazole (PROTONIX) 40 MG tablet Take 1 tablet (40 mg total) by mouth 2 (two) times daily. 90 tablet 3   ramelteon (ROZEREM) 8 MG tablet TAKE 1 TABLET (8 MG TOTAL) BY MOUTH AT BEDTIME AS NEEDED FOR SLEEP. TAKE 30 MINUTES BEFORE BEDTIME. 90 tablet 1   traZODone (DESYREL) 100 MG tablet Take 1.5 tablets (150 mg total) by mouth at bedtime. 45 tablet 2   No current facility-administered medications for this visit.   DG Lumbar Spine Complete  Result Date: 11/25/2022 CLINICAL DATA:  Low back pain. EXAM: LUMBAR SPINE - COMPLETE 4+ VIEW COMPARISON:  None Available. FINDINGS: There is no evidence of lumbar spine fracture. Minimal levocurvature of the spine identified. Minimal anterior spurring identified at L3. Intervertebral disc spaces are maintained. IMPRESSION: Minimal degenerative joint changes of lumbar spine. Electronically Signed   By: Sherian Rein M.D.   On: 11/25/2022 13:29    Review of Systems:   A ROS was performed including pertinent positives and negatives as documented in the HPI.  Physical Exam :   Constitutional: NAD and appears stated age Neurological: Alert and oriented Psych: Appropriate affect and cooperative There were no vitals taken for this visit.   Comprehensive Musculoskeletal Exam:     Tenderness palpation over the webspace between the second and third toes of the right foot with positive compression test.  No tenderness throughout the rest of the foot or ankle.  DP pulses 2+ and neurosensory exam to the foot is intact. Bilateral tenderness throughout the lumbar spine.  Negative straight leg raise bilaterally.  Passive hip range of motion to 120 degrees flexion, 30 degrees external rotation, 20 degrees internal rotation bilaterally.  Knee flexion extension strength is 5/5 with 2+ patellar reflexes.  Imaging:   Xray (right foot 3 views): No evidence of acute bony abnormality.  Diastasis of second and third toes.  Xray (lumbar spine 4 views): No acute bony abnormality.  Some loss of lordotic curvature.  Mild degenerative changes most notable between L2-L3.   I personally reviewed and interpreted the radiographs.   Assessment:   47 y.o. female with a large Morton's neuroma of the right foot.  She did not get extensive relief from last injection so she did discuss wanting to reconsider surgery.  She leaves for a weeklong cruise next weekend, so I would like to give her something for relief.  I do think an oral steroid would be a good option for her, as this would also likely help reduce the symptoms in her low back.  Radiographs of the lumbar spine today did not show any significant abnormalities.  If this does not improve with Medrol dosepak, may consider trial of a muscle relaxer and/or physical therapy.  Will plan to provide her with some tramadol to take as needed, mainly after she is finished the steroid.  Will refill her gabapentin today.  Plan :    -  Start Medrol Dosepak today -Tramadol 50 mg as needed for pain -Return to clinic as needed    I personally saw and evaluated the patient, and participated in the management and treatment plan.  Hazle Nordmann, PA-C Orthopedics

## 2022-11-26 ENCOUNTER — Other Ambulatory Visit: Payer: Self-pay | Admitting: Student

## 2022-11-26 DIAGNOSIS — G4700 Insomnia, unspecified: Secondary | ICD-10-CM

## 2022-11-28 ENCOUNTER — Ambulatory Visit
Admission: EM | Admit: 2022-11-28 | Discharge: 2022-11-28 | Disposition: A | Discharge status: Home / Self Care | Attending: Nurse Practitioner | Admitting: Nurse Practitioner

## 2022-11-28 DIAGNOSIS — J069 Acute upper respiratory infection, unspecified: Secondary | ICD-10-CM

## 2022-11-28 MED ORDER — PSEUDOEPH-BROMPHEN-DM 30-2-10 MG/5ML PO SYRP: 5.0000 mL | ORAL_SOLUTION | Freq: Four times a day (QID) | ORAL | 0 refills | Status: DC | PRN

## 2022-11-28 NOTE — ED Provider Notes (Signed)
[facility]    CSN: [id number] Arrival date & time: 11/28/22  1124      History   Chief Complaint No chief complaint on file.   HPI Kimberly Terrell. Kimberly Terrell is a 47 y.o. female.   The history is provided by the patient and the spouse.   The patient presents with a 3-day history of head pressure, nasal congestion, cough and scratchy throat.  She denies fever, chills, ear pain, ear drainage, facial swelling, wheezing, shortness of breath, difficulty breathing, chest pain, abdominal pain, nausea, vomiting, or diarrhea.  Patient reports that she has been taking Mucinex for her symptoms.  States she was seen at another urgent care 1 day ago, and was tested for strep, which was negative.  She reports that she took a home COVID test yesterday which was also negative.  She is currently on Solu-Medrol for problems with her foot.  Past Medical History:  Diagnosis Date   Anxiety    Arthritis    Asthma    Bursitis    Right Hip and Right Knee   Depression    H. pylori infection    s/p triple therapy x 2   Migraines     Patient Active Problem List   Diagnosis Date Noted   PVC (premature ventricular contraction) 11/17/2022   Leg cramping 11/17/2022   Colon polyps 08/09/2022   Colon cancer screening 04/06/2022   Hemorrhoids 04/06/2022   Insomnia 03/09/2022   Pruritic dermatitis 02/04/2022   Heart failure with recovered ejection fraction (HFrecEF) (HCC) 01/04/2022   Perforated viscus 12/04/2021   Gastric ulcer 12/04/2021   Morton's neuroma of right foot 09/23/2021   Bunion of left foot 09/23/2021   Shoulder pain, bilateral 02/27/2020   Asthma 10/30/2019   Chronic hip pain 10/30/2019   TOA (tubo-ovarian abscess) 07/04/2019   Gastroesophageal reflux disease without esophagitis 05/04/2019   H. pylori infection 03/28/2019   Allergic rhinitis 07/25/2017   Depression with anxiety 04/04/2017   Migraine 04/04/2017   DUB (dysfunctional uterine bleeding) 04/04/2017    Past  Surgical History:  Procedure Laterality Date   COSMETIC SURGERY  2008   breast implants    gastric ulcer surgery     gastric ulcer perforation surgery   LAPAROTOMY N/A 12/04/2021   Procedure: EXPLORATORY LAPAROTOMY;  Surgeon: Harriette Bouillon, MD;  Location: [facility] OR;  Service: General;  Laterality: N/A;    OB History     Gravida  2   Para  1   Term  1   Preterm      AB  1   Living  1      SAB  1   IAB      Ectopic      Multiple      Live Births  1            Home Medications    Prior to Admission medications   Medication Sig Start Date End Date Taking? Authorizing Provider  brompheniramine-pseudoephedrine-DM 30-2-10 MG/5ML syrup Take 5 mLs by mouth 4 (four) times daily as needed. 11/28/22  Yes Jalayia Bagheri-Warren, Sadie Haber, NP  acetaminophen (TYLENOL) 500 MG tablet Take 2 tablets (1,000 mg total) by mouth every 6 (six) hours as needed. 12/15/21   Barnetta Chapel, PA-C  Acetaminophen-guaiFENesin [facility] COLD & FLU) 325-200 MG CAPS Take 2 tablets by mouth daily as needed (cold symptoms).    [provider]  albuterol (VENTOLIN HFA) 108 (90 Base) MCG/ACT inhaler Inhale 2 puffs into the lungs every 6 (six)  hours as needed for wheezing or shortness of breath. 10/22/20   Jannifer Rodney A, FNP  Budeson-Glycopyrrol-Formoterol (BREZTRI AEROSPHERE) 160-9-4.8 MCG/ACT AERO Inhale 2 puffs into the lungs in the morning and at bedtime. 07/09/22   Hunsucker, Lesia Sago, MD  busPIRone (BUSPAR) 10 MG tablet Take 2 tablets (20 mg total) by mouth 3 (three) times daily. 11/17/22 02/15/23  [name], MD  diclofenac sodium (VOLTAREN) 1 % GEL Apply 2 g topically 4 (four) times daily. Patient taking differently: Apply 2 g topically 4 (four) times daily as needed (pain). 06/27/18   Daphine Deutscher, Mary-Margaret, FNP  escitalopram (LEXAPRO) 10 MG tablet TAKE 2 TABLETS BY MOUTH EVERY DAY 08/26/22   [name], MD  ferrous sulfate 325 (65 FE) MG tablet Take 1 tablet (325 mg total)  by mouth every other day. 11/18/22 11/18/23  Champ Mungo, DO  fluticasone (FLONASE) 50 MCG/ACT nasal spray Place 1 spray into both nostrils daily. 08/25/22   [provider]  gabapentin (NEURONTIN) 300 MG capsule Take 1 capsule (300 mg total) by mouth 3 (three) times daily for 15 days. Do not take this medication while driving 1/61/09 08/30/52  Overturf, Lyman Speller, PA-C  hydrocortisone (ANUSOL-HC) 25 MG suppository Place 1 suppository (25 mg total) rectally daily. 06/18/22   Mansouraty, Netty Starring., MD  levalbuterol [facility] HFA) 45 MCG/ACT inhaler Inhale 2 puffs into the lungs every 6 (six) hours as needed for wheezing or shortness of breath. 07/05/22   Hunsucker, Lesia Sago, MD  Magnesium 400 MG TABS Take 400 mg by mouth daily. 10/15/22   Mealor, Roberts Gaudy, MD  Melatonin 10 MG CAPS Take 10 mg by mouth at bedtime.    [provider]  methylPREDNISolone (MEDROL DOSEPAK) 4 MG TBPK tablet Take per packet instructions 11/25/22   Hazle Nordmann L, PA-C  metoprolol succinate (TOPROL-XL) 50 MG 24 hr tablet Take 1 tablet (50 mg total) by mouth daily. Take with or immediately following a meal. 10/15/22 01/13/23  Mealor, Roberts Gaudy, MD  pantoprazole (PROTONIX) 40 MG tablet Take 1 tablet (40 mg total) by mouth 2 (two) times daily. 06/18/22   Mansouraty, Netty Starring., MD  ramelteon (ROZEREM) 8 MG tablet TAKE 1 TABLET (8 MG TOTAL) BY MOUTH AT BEDTIME AS NEEDED FOR SLEEP. TAKE 30 MINUTES BEFORE BEDTIME. 11/26/22 11/26/23  [name], MD  traMADol (ULTRAM) 50 MG tablet Take 1 tablet (50 mg total) by mouth every 6 (six) hours as needed for up to 5 days. 11/25/22 11/30/22  Amador Cunas, PA-C  traZODone (DESYREL) 100 MG tablet Take 1.5 tablets (150 mg total) by mouth at bedtime. 11/17/22 02/15/23  [name], MD    Family History Family History  Problem Relation Age of Onset   Depression Mother    Depression Daughter    Colon cancer Neg Hx    Esophageal cancer Neg Hx    Gastric  cancer Neg Hx    Inflammatory bowel disease Neg Hx    Liver disease Neg Hx    Pancreatic cancer Neg Hx    Rectal cancer Neg Hx    Stomach cancer Neg Hx     Social History Social History   Tobacco Use   Smoking status: Former    Current packs/day: 0.00    Types: Cigarettes    Quit date: 12/03/2021    Years since quitting: 0.9   Smokeless tobacco: Never   Tobacco comments:    Trying   Vaping Use   Vaping status: Never Used  Substance Use Topics  Alcohol use: No   Drug use: No     Allergies   Imitrex [sumatriptan], Tape, and Aspirin   Review of Systems Review of Systems Per HPI  Physical Exam Triage Vital Signs ED Triage Vitals  Encounter Vitals Group     BP 11/28/22 1205 107/64     Systolic BP Percentile --      Diastolic BP Percentile --      Pulse Rate 11/28/22 1205 80     Resp 11/28/22 1205 16     Temp 11/28/22 1205 98.3 F (36.8 C)     Temp Source 11/28/22 1205 Oral     SpO2 11/28/22 1205 96 %     Weight --      Height --      Head Circumference --      Peak Flow --      Pain Score 11/28/22 1206 0     Pain Loc --      Pain Education --      Exclude from Growth Chart --    No data found.  Updated Vital Signs BP 107/64 (BP Location: Right Arm)   Pulse 80   Temp 98.3 F (36.8 C) (Oral)   Resp 16   LMP 11/28/2022   SpO2 96%   Visual Acuity Right Eye Distance:   Left Eye Distance:   Bilateral Distance:    Right Eye Near:   Left Eye Near:    Bilateral Near:     Physical Exam Vitals and nursing note reviewed.  Constitutional:      General: She is not in acute distress.    Appearance: Normal appearance.  HENT:     Head: Normocephalic.     Right Ear: Tympanic membrane, ear canal and external ear normal.     Left Ear: Tympanic membrane, ear canal and external ear normal.     Nose: Congestion present.     Right Turbinates: Enlarged and swollen.     Left Turbinates: Enlarged and swollen.     Right Sinus: No maxillary sinus tenderness or  frontal sinus tenderness.     Left Sinus: No maxillary sinus tenderness or frontal sinus tenderness.     Comments: Cobblestoning present to posterior oropharynx    Mouth/Throat:     Mouth: Mucous membranes are moist.  Eyes:     Extraocular Movements: Extraocular movements intact.     Pupils: Pupils are equal, round, and reactive to light.  Cardiovascular:     Rate and Rhythm: Normal rate and regular rhythm.     Pulses: Normal pulses.     Heart sounds: Normal heart sounds.  Pulmonary:     Effort: Pulmonary effort is normal. No respiratory distress.     Breath sounds: Normal breath sounds. No stridor. No wheezing, rhonchi or rales.  Abdominal:     General: Bowel sounds are normal.     Palpations: Abdomen is soft.     Tenderness: There is no abdominal tenderness.  Musculoskeletal:     Cervical back: Normal range of motion.  Lymphadenopathy:     Cervical: No cervical adenopathy.  Skin:    General: Skin is warm and dry.  Neurological:     General: No focal deficit present.     Mental Status: She is alert and oriented to person, place, and time.  Psychiatric:        Mood and Affect: Mood normal.        Behavior: Behavior normal.      UC Treatments /  Results  Labs (all labs ordered are listed, but only abnormal results are displayed) Labs Reviewed - No data to display  EKG   Radiology No results found.  Procedures Procedures (including critical care time)  Medications Ordered in UC Medications - No data to display  Initial Impression / Assessment and Plan / UC Course  I have reviewed the triage vital signs and the nursing notes.  Pertinent labs & imaging results that were available during my care of the patient were reviewed by me and considered in my medical decision making (see chart for details).  The patient is well-appearing, she is in no acute distress, vital signs are stable.  Suspect a viral upper respiratory infection.  Recent strep and COVID test were  negative.  Will treat with Bromfed-DM for cough and nasal congestion.  Supportive care recommendations were provided and discussed with the patient to include over-the-counter analgesics for pain or discomfort, use of normal saline nasal spray for nasal congestion and runny nose, and use of a humidifier at bedtime during sleep.  Patient was advised to continue her current medications.  Encourage patient to use Flonase if she is able to tolerate.  Patient was advised to follow-up if symptoms do not improve over the next 5 to 7 days.  Patient was in agreement with this plan of care and verbalizes understanding.  All questions were answered.  Patient stable for discharge.  Final Clinical Impressions(s) / UC Diagnoses   Final diagnoses:  Viral upper respiratory tract infection with cough     Discharge Instructions      Take medication as prescribed.  Stop use of Mucinex when starting the cough medication. Increase fluids and allow for plenty of rest. Recommend Tylenol as needed for pain, fever, or general discomfort. Warm salt water gargles 3-4 times daily to help with throat pain or discomfort. Recommend using a humidifier at bedtime during sleep to help with cough and nasal congestion and sleeping elevated on pillows while cough symptoms persist. If symptoms are not improving over the next 5 to 7 days, or if they worsen before that time, please follow-up for further evaluation. Follow-up as needed. Follow-up if your symptoms do not improve.      ED Prescriptions     Medication Sig Dispense Auth. Provider   brompheniramine-pseudoephedrine-DM 30-2-10 MG/5ML syrup Take 5 mLs by mouth 4 (four) times daily as needed. 140 mL Shanesha Bednarz-Warren, Sadie Haber, NP      PDMP not reviewed this encounter.   Abran Cantor, NP 11/28/22 1253

## 2022-11-28 NOTE — ED Triage Notes (Signed)
Pt reports she has nasal congestion, head pressure, and scratchy throat x 3 days    Took mucinex for relief    Pt on Solumedrol for foot problems. Strep test was neg at other UC.

## 2022-11-28 NOTE — Discharge Instructions (Addendum)
Take medication as prescribed.  Stop use of Mucinex when starting the cough medication. Increase fluids and allow for plenty of rest. Recommend Tylenol as needed for pain, fever, or general discomfort. Warm salt water gargles 3-4 times daily to help with throat pain or discomfort. Recommend using a humidifier at bedtime during sleep to help with cough and nasal congestion and sleeping elevated on pillows while cough symptoms persist. If symptoms are not improving over the next 5 to 7 days, or if they worsen before that time, please follow-up for further evaluation. Follow-up as needed. Follow-up if your symptoms do not improve.

## 2022-11-29 ENCOUNTER — Ambulatory Visit

## 2022-12-01 ENCOUNTER — Other Ambulatory Visit (HOSPITAL_BASED_OUTPATIENT_CLINIC_OR_DEPARTMENT_OTHER): Payer: Self-pay | Admitting: Student

## 2022-12-01 ENCOUNTER — Encounter (HOSPITAL_BASED_OUTPATIENT_CLINIC_OR_DEPARTMENT_OTHER): Payer: Self-pay

## 2022-12-01 MED ORDER — METHOCARBAMOL 500 MG PO TABS: 500.0000 mg | ORAL_TABLET | Freq: Four times a day (QID) | ORAL | 0 refills | Status: AC | PRN

## 2022-12-09 ENCOUNTER — Other Ambulatory Visit: Payer: Self-pay | Admitting: Student

## 2022-12-09 DIAGNOSIS — G4700 Insomnia, unspecified: Secondary | ICD-10-CM

## 2022-12-11 ENCOUNTER — Other Ambulatory Visit: Payer: Self-pay | Admitting: Gastroenterology

## 2022-12-11 DIAGNOSIS — K255 Chronic or unspecified gastric ulcer with perforation: Secondary | ICD-10-CM

## 2022-12-16 ENCOUNTER — Ambulatory Visit (INDEPENDENT_AMBULATORY_CARE_PROVIDER_SITE_OTHER): Admitting: Orthopedic Surgery

## 2022-12-16 ENCOUNTER — Encounter: Payer: Self-pay | Admitting: Orthopedic Surgery

## 2022-12-16 DIAGNOSIS — G5761 Lesion of plantar nerve, right lower limb: Secondary | ICD-10-CM | POA: Diagnosis not present

## 2022-12-16 DIAGNOSIS — M7741 Metatarsalgia, right foot: Secondary | ICD-10-CM

## 2022-12-16 DIAGNOSIS — M6701 Short Achilles tendon (acquired), right ankle: Secondary | ICD-10-CM | POA: Diagnosis not present

## 2022-12-16 NOTE — Progress Notes (Addendum)
Office Visit Note   Patient: Kimberly Terrell           Date of Birth: [birth year]           MRN: [id number] Visit Date: 12/16/2022              Requested by: [name], MD [address] [place],  [place] [address] PCP: [name], MD  Chief Complaint  Patient presents with   Right Foot - Pain      HPI: Patient is a 47 year old woman seen for initial evaluation for pain right foot between the second and third toes.  Patient states that she has had temporary relief with a steroid injection in the second webspace and currently has relief with Neurontin.  Assessment & Plan: Visit Diagnoses:  1. Achilles tendon contracture, right   2. Metatarsalgia, right foot   3. Morton's neuroma of right foot     Plan: Patient was given instructions and demonstrated Achilles stretching for her Achilles contracture.  Recommended a wide sneaker to unload lateral pressure and also recommended a stiff soled sneaker.  She should stand on a orthotic to make sure her foot is not wider than the shoe orthotic.  We will follow-up after the MRI scan to evaluate if there is a soft tissue lesion.  Patient will continue with her Neurontin.  Follow-Up Instructions: No follow-ups on file.   Ortho Exam  Patient is alert, oriented, no adenopathy, well-dressed, normal affect, normal respiratory effort. Examination patient has a good dorsalis pedis pulse on the right.  She has splaying of the second and third toes.  Lateral compression across the metatarsal heads reproduces pain.  She has some mild discomfort to palpation beneath the second and third metatarsal.  Radiograph shows widening of the second webspace does not show increased length of the second third or fourth metatarsals.  She has no pain to palpation in the third webspace.  He with her knee extended she has dorsiflexion only to neutral with Achilles tightness.  There are no Achilles nodules.  There is no skin color changes no palpable  nodules.  Imaging: No results found. No images are attached to the encounter.  Labs: Lab Results  Component Value Date   HGBA1C 5.6 11/17/2022   REPTSTATUS 12/12/2021 FINAL 12/06/2021   CULT  12/06/2021    NO GROWTH 5 DAYS Performed at [facility], [address], [place], [place] [address]      Lab Results  Component Value Date   ALBUMIN 4.5 08/12/2022   ALBUMIN 4.6 08/06/2022   ALBUMIN 2.1 (L) 12/05/2021    Lab Results  Component Value Date   MG 1.9 12/15/2021   MG 1.8 12/12/2021   MG 1.9 12/09/2021   No results found for: "VD25OH"  No results found for: "PREALBUMIN"    Latest Ref Rng & Units 11/17/2022    2:27 PM 08/12/2022    6:45 PM 08/06/2022   10:18 AM  CBC EXTENDED  WBC 3.4 - 10.8 x10E3/uL 10.0  10.0  10.9   RBC 3.77 - 5.28 x10E6/uL 4.46  4.26  4.61   Hemoglobin 11.1 - 15.9 g/dL 86.5  78.4  69.6   HCT 34.0 - 46.6 % 35.1  33.6  36.3   Platelets 150 - 450 x10E3/uL 269  336  373      There is no height or weight on file to calculate BMI.  Orders:  No orders of the defined types were placed in this encounter.  No orders of the defined types were placed in this encounter.    Procedures: No procedures performed  Clinical Data: No additional findings.  ROS:  All other systems negative, except as noted in the HPI. Review of Systems  Objective: Vital Signs: LMP 11/28/2022   Specialty Comments:  No specialty comments available.  PMFS History: Patient Active Problem List   Diagnosis Date Noted   PVC (premature ventricular contraction) 11/17/2022   Leg cramping 11/17/2022   Colon polyps 08/09/2022   Colon cancer screening 04/06/2022   Hemorrhoids 04/06/2022   Insomnia 03/09/2022   Pruritic dermatitis 02/04/2022   Heart failure with recovered ejection fraction (HFrecEF) (HCC) 01/04/2022   Perforated viscus 12/04/2021   Gastric ulcer 12/04/2021   Morton's neuroma of right foot 09/23/2021   Bunion of left foot 09/23/2021   Shoulder  pain, bilateral 02/27/2020   Asthma 10/30/2019   Chronic hip pain 10/30/2019   TOA (tubo-ovarian abscess) 07/04/2019   Gastroesophageal reflux disease without esophagitis 05/04/2019   H. pylori infection 03/28/2019   Allergic rhinitis 07/25/2017   Depression with anxiety 04/04/2017   Migraine 04/04/2017   DUB (dysfunctional uterine bleeding) 04/04/2017   Past Medical History:  Diagnosis Date   Anxiety    Arthritis    Asthma    Bursitis    Right Hip and Right Knee   Depression    H. pylori infection    s/p triple therapy x 2   Migraines     Family History  Problem Relation Age of Onset   Depression Mother    Depression Daughter    Colon cancer Neg Hx    Esophageal cancer Neg Hx    Gastric cancer Neg Hx    Inflammatory bowel disease Neg Hx    Liver disease Neg Hx    Pancreatic cancer Neg Hx    Rectal cancer Neg Hx    Stomach cancer Neg Hx     Past Surgical History:  Procedure Laterality Date   COSMETIC SURGERY  2008   breast implants    gastric ulcer surgery     gastric ulcer perforation surgery   LAPAROTOMY N/A 12/04/2021   Procedure: EXPLORATORY LAPAROTOMY;  Surgeon: Harriette Bouillon, MD;  Location: [facility] OR;  Service: General;  Laterality: N/A;   Social History   Occupational History   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, [employer]  Tobacco Use   Smoking status: Former    Current packs/day: 0.00    Types: Cigarettes    Quit date: 12/03/2021    Years since quitting: 1.0   Smokeless tobacco: Never   Tobacco comments:    Trying   Vaping Use   Vaping status: Never Used  Substance and Sexual Activity   Alcohol use: No   Drug use: No   Sexual activity: Yes    Birth control/protection: I.U.D., Surgical    Comment: husband has had a vasectomy

## 2022-12-20 ENCOUNTER — Ambulatory Visit (INDEPENDENT_AMBULATORY_CARE_PROVIDER_SITE_OTHER): Admitting: Licensed Clinical Social Worker

## 2022-12-20 DIAGNOSIS — F418 Other specified anxiety disorders: Secondary | ICD-10-CM

## 2022-12-20 NOTE — BH Specialist Note (Signed)
Integrated Behavioral Health via Telemedicine Visit  12/20/2022 Kimberly Terrell. [name] [id number]  Number of Integrated Behavioral Health Clinician visits: Additional Visit  Session Start time: 1330   Session End time: 1430  Total time in minutes: 60   Referring Provider: [name], MD  Patient/Family location: [facility] Provider location: Office All persons participating in visit: Patient and [name] Types of Service: Individual psychotherapy  I connected with Kimberly Terrell and/or Kimberly Terrell's  via  Telephone  and verified that I am speaking with the correct person using two identifiers. Discussed confidentiality: Yes   I discussed the limitations of telemedicine and the availability of in person appointments.  Discussed there is a possibility of technology failure and discussed alternative modes of communication if that failure occurs.  I discussed that engaging in this telemedicine visit, they consent to the provision of behavioral healthcare and the services will be billed under their insurance.  Patient and/or legal guardian expressed understanding and consented to Telemedicine visit: Yes   Presenting Concerns: Patient and/or family reports the following symptoms/concerns:  The patient continues to address her depression and emotional regulation challenges. During the session, she discussed traumatic incidents involving her family, particularly focusing on her relationship with her biological father. As a therapeutic intervention, the patient agreed to write a letter to her father as a coping strategy to help process her trauma and manage her emotions. This writing exercise aims to provide a safe outlet for expressing suppressed feelings, allow the patient to articulate her experiences and emotions, and potentially reduce symptoms of depression and anxiety. The patient exhibited significant emotional distress when discussing her father, indicating unresolved  trauma. We will review the letter in the next session, discussing any insights or emotions that emerged during the writing process. Additionally, we will continue to reinforce healthy coping mechanisms and work on developing skills to manage intense emotions, particularly when triggered by memories of her father or family experiences.  Goals: Reduce symptoms of depression Improve emotional regulation skills Process childhood trauma related to her father Enhance self-esteem and self-worth The patient demonstrated courage in addressing these difficult topics. We will continue to provide a supportive environment for her to explore and process her emotions related to her family experiences.  Duration of problem: More than 5 years; Severity of problem: moderate  Patient and/or Family's Strengths/Protective Factors: Sense of purpose  Goals Addressed: Patient will:  Reduce symptoms of: depression and mood instability   Increase knowledge and/or ability of: coping skills   Demonstrate ability to: Increase healthy adjustment to current life circumstances  Progress towards Goals: Ongoing  Interventions: Interventions utilized:  Motivational Interviewing and Solution-Focused Strategies Standardized Assessments completed: PHQ-SADS  Patient and/or Family Response: Patient agrees to continue therapy  Assessment: Patient currently experiencing mood instability and depression.   Patient may benefit from Ongoing individual therapy.  Plan: Follow up with behavioral health clinician on : within the next 30 days.   I discussed the assessment and treatment plan with the patient and/or parent/guardian. They were provided an opportunity to ask questions and all were answered. They agreed with the plan and demonstrated an understanding of the instructions.   They were advised to call back or seek an in-person evaluation if the symptoms worsen or if the condition fails to improve as  anticipated.  Elmer Sow

## 2022-12-24 ENCOUNTER — Ambulatory Visit (INDEPENDENT_AMBULATORY_CARE_PROVIDER_SITE_OTHER): Admitting: Student

## 2022-12-24 VITALS — BP 111/57 | HR 64 | Temp 97.9°F | Ht 64.0 in | Wt 174.8 lb

## 2022-12-24 DIAGNOSIS — F418 Other specified anxiety disorders: Secondary | ICD-10-CM | POA: Diagnosis not present

## 2022-12-24 DIAGNOSIS — G5761 Lesion of plantar nerve, right lower limb: Secondary | ICD-10-CM

## 2022-12-24 DIAGNOSIS — Z8639 Personal history of other endocrine, nutritional and metabolic disease: Secondary | ICD-10-CM

## 2022-12-24 DIAGNOSIS — Z0000 Encounter for general adult medical examination without abnormal findings: Secondary | ICD-10-CM

## 2022-12-24 MED ORDER — GABAPENTIN 300 MG PO CAPS: 300.0000 mg | ORAL_CAPSULE | Freq: Three times a day (TID) | ORAL | 0 refills | Status: DC

## 2022-12-24 MED ORDER — GABAPENTIN 400 MG PO CAPS: 400.0000 mg | ORAL_CAPSULE | Freq: Three times a day (TID) | ORAL | 2 refills | Status: DC

## 2022-12-24 NOTE — Progress Notes (Signed)
Subjective:  CC: Routine follow up  HPI:  Kimberly Terrell is a 47 y.o. person with a past medical history stated below and presents today for follow up. Please see problem based assessment and plan for additional details.  Past Medical History:  Diagnosis Date   Anxiety    Arthritis    Asthma    Bursitis    Right Hip and Right Knee   Depression    H. pylori infection    s/p triple therapy x 2   Migraines     Current Outpatient Medications on File Prior to Visit  Medication Sig Dispense Refill   acetaminophen (TYLENOL) 500 MG tablet Take 2 tablets (1,000 mg total) by mouth every 6 (six) hours as needed. 30 tablet 0   Acetaminophen-guaiFENesin (MUCINEX COLD & FLU) 325-200 MG CAPS Take 2 tablets by mouth daily as needed (cold symptoms).     albuterol (VENTOLIN HFA) 108 (90 Base) MCG/ACT inhaler Inhale 2 puffs into the lungs every 6 (six) hours as needed for wheezing or shortness of breath. 18 g 0   brompheniramine-pseudoephedrine-DM 30-2-10 MG/5ML syrup Take 5 mLs by mouth 4 (four) times daily as needed. 140 mL 0   Budeson-Glycopyrrol-Formoterol (BREZTRI AEROSPHERE) 160-9-4.8 MCG/ACT AERO Inhale 2 puffs into the lungs in the morning and at bedtime. 10.7 g 5   busPIRone (BUSPAR) 10 MG tablet Take 2 tablets (20 mg total) by mouth 3 (three) times daily. 180 tablet 2   diclofenac sodium (VOLTAREN) 1 % GEL Apply 2 g topically 4 (four) times daily. (Patient taking differently: Apply 2 g topically 4 (four) times daily as needed (pain).) 300 g 1   escitalopram (LEXAPRO) 10 MG tablet TAKE 2 TABLETS BY MOUTH EVERY DAY 180 tablet 1   ferrous sulfate 325 (65 FE) MG tablet Take 1 tablet (325 mg total) by mouth every other day. 30 tablet 2   fluticasone (FLONASE) 50 MCG/ACT nasal spray Place 1 spray into both nostrils daily.     hydrocortisone (ANUSOL-HC) 25 MG suppository Place 1 suppository (25 mg total) rectally daily. 7 suppository 1   levalbuterol (XOPENEX HFA) 45 MCG/ACT inhaler  Inhale 2 puffs into the lungs every 6 (six) hours as needed for wheezing or shortness of breath. 1 each 12   Magnesium 400 MG TABS Take 400 mg by mouth daily. 90 tablet 3   Melatonin 10 MG CAPS Take 10 mg by mouth at bedtime.     methylPREDNISolone (MEDROL DOSEPAK) 4 MG TBPK tablet Take per packet instructions 21 each 0   metoprolol succinate (TOPROL-XL) 50 MG 24 hr tablet Take 1 tablet (50 mg total) by mouth daily. Take with or immediately following a meal. 90 tablet 3   pantoprazole (PROTONIX) 40 MG tablet Take 1 tablet (40 mg total) by mouth daily. 90 tablet 1   ramelteon (ROZEREM) 8 MG tablet TAKE 1 TABLET (8 MG TOTAL) BY MOUTH AT BEDTIME AS NEEDED FOR SLEEP. TAKE 30 MINUTES BEFORE BEDTIME. 90 tablet 1   traZODone (DESYREL) 100 MG tablet Take 1 tablet (100 mg total) by mouth at bedtime. 90 tablet 3   No current facility-administered medications on file prior to visit.    Review of Systems: Please see assessment and plan for pertinent positives and negatives.  Objective:   Vitals:   12/24/22 1047  BP: (!) 111/57  Pulse: 64  Temp: 97.9 F (36.6 C)  TempSrc: Oral  SpO2: 99%  Weight: 174 lb 12.8 oz (79.3 kg)  Height: 5\' 4"  (1.626  m)    Physical Exam: Constitutional: Well-appearing, in no acute distress Cardiovascular: regular rate and rhythm, no m/r/g Pulmonary/Chest: normal work of breathing on room air, lungs clear to auscultation bilaterally Abdominal: soft, non-tender, non-distended Extremities: No edema of the lower extremities bilaterally Psych: pleasant mood and affect     Assessment & Plan:  Morton's neuroma of right foot Persistent pain of the metatarsal 2-2 metatarsalgia and dorsal pain d/t Morton's Neuroma. Will get MRI soon to assess surgical candidacy. Requesting refill on Gabapentin. She says the gabapentin helps to control her pain a but, but would like to try increasing the dose. No side effects currently such as lethargy. Discussed Lyrical or Cymbalta with the  patient, but states she tried in the past and feels like gabapentin works the best. Plan: Gabapentin 400mg  TID. Spoke to pharmacy to confirm this order as they have had some trouble in the past with medications.    Health care maintenance Due for Mammogram, patient has implants. This should not be a contraindication to mammography. Recent colonoscopy in setting of hospitalization following perforate ulcer - repeat in 3 years. Would like to defer Pap in setting of bleeding.  Plan: Pap next visit Mammo referral placed   History of iron deficiency Unclear etiology of her IDA. Denies GI symptoms c/f inflammatory disease such as chronic diarrhea, melena or hematochezia. Endorses irregular heavy menstrual cycles. Some nausea from iron supplements. Recommend to take iron with food which seems to help. She went on a cruise recently and hadn't taken her iron during this time.  Plan: Continue PO iron, can consider IV if she continues to have side effects Repeat CBC and iron panel next visit.    Depression with anxiety GAD-7 and PHQ-9 improved since last visit. She feels that the medication changes we made helped. Denies SI. Would like to continue with current medication regiment. States she has good days and bad days but overall is doing better. Joined by her husband in the room who also agrees that she has been feeling better.  Plan: Continue current regiment Continue to follow up with her regarding mood Glad she is doing better    Patient seen with Dr. Julian Reil MD [facility]  PGY-1 Pager: [phone]  Phone: [phone] Date 12/24/2022  Time 3:36 PM

## 2022-12-24 NOTE — Assessment & Plan Note (Signed)
GAD-7 and PHQ-9 improved since last visit. She feels that the medication changes we made helped. Denies SI. Would like to continue with current medication regiment. States she has good days and bad days but overall is doing better. Joined by her husband in the room who also agrees that she has been feeling better.  Plan: Continue current regiment Continue to follow up with her regarding mood Glad she is doing better

## 2022-12-24 NOTE — Assessment & Plan Note (Signed)
Persistent pain of the metatarsal 2-2 metatarsalgia and dorsal pain d/t Morton's Neuroma. Will get MRI soon to assess surgical candidacy. Requesting refill on Gabapentin. She says the gabapentin helps to control her pain a but, but would like to try increasing the dose. No side effects currently such as lethargy. Discussed Lyrical or Cymbalta with the patient, but states she tried in the past and feels like gabapentin works the best. Plan: Gabapentin 400mg  TID. Spoke to pharmacy to confirm this order as they have had some trouble in the past with medications.

## 2022-12-24 NOTE — Assessment & Plan Note (Signed)
Due for Mammogram, patient has implants. This should not be a contraindication to mammography. Recent colonoscopy in setting of hospitalization following perforate ulcer - repeat in 3 years. Would like to defer Pap in setting of bleeding.  Plan: Pap next visit Mammo referral placed

## 2022-12-24 NOTE — Patient Instructions (Addendum)
Thank you, [name] for allowing [place] to provide your care today.  Start the following medications: Meds ordered this encounter  Medications   DISCONTD: gabapentin (NEURONTIN) 300 MG capsule    Sig: Take 1 capsule (300 mg total) by mouth 3 (three) times daily for 15 days. Do not take this medication while driving    Dispense:  45 capsule    Refill:  0   gabapentin (NEURONTIN) 400 MG capsule    Sig: Take 1 capsule (400 mg total) by mouth 3 (three) times daily.    Dispense:  90 capsule    Refill:  2     Follow up:  4 weeks  for Follow up / Pap / Iron panel  Please remember: Take your iron pills with food.    We look forward to seeing you next time. Please call our clinic at [phone] if you have any questions or concerns. The best time to call is Monday-Friday from 9am-4pm, but there is someone available 24/7. If after hours or the weekend, call the main hospital number and ask for the Internal Medicine Resident On-Call. If you need medication refills, please notify your pharmacy one week in advance and they will send [place] a request.   Thank you for trusting me with your care. Wishing you the best!  Lovie Macadamia MD [facility]

## 2022-12-24 NOTE — Assessment & Plan Note (Signed)
Unclear etiology of her IDA. Denies GI symptoms c/f inflammatory disease such as chronic diarrhea, melena or hematochezia. Endorses irregular heavy menstrual cycles. Some nausea from iron supplements. Recommend to take iron with food which seems to help. She went on a cruise recently and hadn't taken her iron during this time.  Plan: Continue PO iron, can consider IV if she continues to have side effects Repeat CBC and iron panel next visit.

## 2022-12-27 NOTE — Progress Notes (Signed)
Internal Medicine Clinic Attending  I was physically present during the key portions of the resident provided service and participated in the medical decision making of patient's management care. I reviewed pertinent patient test results.  The assessment, diagnosis, and plan were formulated together and I agree with the documentation in the resident's note.  Recent labs consistent with iron deficiency anemia, unclear etiology. EGD/CSY in March 2024 without evidence of active bleeding or IBD, no bleeding reported by patient during today's visit. No additional GI sx to suggest celiac disease. Patient notes she has irregular periods and not particularly heavy. Dr. Rosaura Carpenter has discussed regular iron supplementation with plan to recheck labs at f/u. If no-minimal improvement, I would consider celiac testing.  Dickie La, MD

## 2022-12-27 NOTE — Addendum Note (Signed)
Addended by: Dickie La on: 12/27/2022 10:53 AM   Modules accepted: Level of Service

## 2023-01-06 ENCOUNTER — Other Ambulatory Visit: Payer: Self-pay | Admitting: Student

## 2023-01-06 DIAGNOSIS — F411 Generalized anxiety disorder: Secondary | ICD-10-CM

## 2023-01-10 ENCOUNTER — Ambulatory Visit
Admission: RE | Admit: 2023-01-10 | Discharge: 2023-01-10 | Disposition: A | Discharge status: Home / Self Care | Source: Ambulatory Visit | Attending: Orthopedic Surgery | Admitting: Orthopedic Surgery

## 2023-01-10 DIAGNOSIS — M7741 Metatarsalgia, right foot: Secondary | ICD-10-CM

## 2023-01-10 DIAGNOSIS — M6701 Short Achilles tendon (acquired), right ankle: Secondary | ICD-10-CM

## 2023-01-10 DIAGNOSIS — G5761 Lesion of plantar nerve, right lower limb: Secondary | ICD-10-CM

## 2023-01-24 ENCOUNTER — Encounter: Payer: Self-pay | Admitting: Orthopedic Surgery

## 2023-01-24 ENCOUNTER — Ambulatory Visit: Admitting: Orthopedic Surgery

## 2023-01-24 DIAGNOSIS — G5761 Lesion of plantar nerve, right lower limb: Secondary | ICD-10-CM | POA: Diagnosis not present

## 2023-01-24 NOTE — Progress Notes (Signed)
Office Visit Note   Patient: Kimberly Terrell           Date of Birth: Mar 07, 1976           MRN: [id number] Visit Date: 01/24/2023              Requested by: [name], MD [address] [place],  [place] [address] PCP: [name], MD  Chief Complaint  Patient presents with   Right Foot - Pain, Follow-up    MRI review      HPI: Patient is seen in follow-up status post MRI scan right foot with soft tissue mass in the second webspace right foot.  Patient states she is still has pain with activities of daily living.  Patient states she used to dance in high-heeled shoes.  Assessment & Plan: Visit Diagnoses:  1. Morton's neuroma of right foot     Plan: Recommended proceeding with an excision of the neuroma mass second webspace right foot.  Will send this to pathology for further evaluation.  Plan for outpatient surgery.  Follow-Up Instructions: Return in about 2 weeks (around 02/07/2023).   Ortho Exam  Patient is alert, oriented, no adenopathy, well-dressed, normal affect, normal respiratory effort. Patient has a palpable dorsalis pedis pulse.  There is a soft tissue mass in the second webspace with splaying of the second and third toes.  There is no redness or cellulitis.  Review of the MRI scan does show a soft tissue mass that is consistent with a Morton's neuroma.  Imaging: No results found. No images are attached to the encounter.  Labs: Lab Results  Component Value Date   HGBA1C 5.6 11/17/2022   REPTSTATUS 12/12/2021 FINAL 12/06/2021   CULT  12/06/2021    NO GROWTH 5 DAYS Performed at [facility], [address] [place], [place] [address]      Lab Results  Component Value Date   ALBUMIN 4.5 08/12/2022   ALBUMIN 4.6 08/06/2022   ALBUMIN 2.1 (L) 12/05/2021    Lab Results  Component Value Date   MG 1.9 12/15/2021   MG 1.8 12/12/2021   MG 1.9 12/09/2021   No results found for: "VD25OH"  No results found for:  "PREALBUMIN"    Latest Ref Rng & Units 11/17/2022    2:27 PM 08/12/2022    6:45 PM 08/06/2022   10:18 AM  CBC EXTENDED  WBC 3.4 - 10.8 x10E3/uL 10.0  10.0  10.9   RBC 3.77 - 5.28 x10E6/uL 4.46  4.26  4.61   Hemoglobin 11.1 - 15.9 g/dL 86.5  78.4  69.6   HCT 34.0 - 46.6 % 35.1  33.6  36.3   Platelets 150 - 450 x10E3/uL 269  336  373      There is no height or weight on file to calculate BMI.  Orders:  No orders of the defined types were placed in this encounter.  No orders of the defined types were placed in this encounter.    Procedures: No procedures performed  Clinical Data: No additional findings.  ROS:  All other systems negative, except as noted in the HPI. Review of Systems  Objective: Vital Signs: There were no vitals taken for this visit.  Specialty Comments:  No specialty comments available.  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 pylori infection 03/28/2019   Allergic rhinitis 07/25/2017   Depression with anxiety 04/04/2017   Migraine 04/04/2017   DUB (dysfunctional uterine bleeding) 04/04/2017   Past Medical History:  Diagnosis Date   Anxiety    Arthritis    Asthma    Bursitis    Right Hip and Right Knee   Depression    H. pylori infection    s/p  triple therapy x 2   Migraines     Family History  Problem Relation Age of Onset   Depression Mother    Depression Daughter    Colon cancer Neg Hx    Esophageal cancer Neg Hx    Gastric cancer Neg Hx    Inflammatory bowel disease Neg Hx    Liver disease Neg Hx    Pancreatic cancer Neg Hx    Rectal cancer Neg Hx    Stomach cancer Neg Hx     Past Surgical History:  Procedure Laterality Date   COSMETIC SURGERY  2008   breast implants    gastric ulcer surgery     gastric ulcer perforation surgery   LAPAROTOMY N/A 12/04/2021   Procedure: EXPLORATORY LAPAROTOMY;  Surgeon: Harriette Bouillon, MD;  Location: [facility] OR;  Service: General;  Laterality: N/A;   Social History   Occupational History   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, [employer]  Tobacco Use   Smoking status: Former    Current packs/day: 0.00    Types: Cigarettes    Quit date: 12/03/2021    Years since quitting: 1.1   Smokeless tobacco: Never   Tobacco comments:    Trying   Vaping Use   Vaping status: Never Used  Substance and Sexual Activity   Alcohol use: No   Drug use: No   Sexual activity: Yes    Birth control/protection: I.U.D., Surgical    Comment: husband has had a vasectomy

## 2023-01-27 ENCOUNTER — Encounter: Payer: Self-pay | Admitting: Internal Medicine

## 2023-01-27 ENCOUNTER — Ambulatory Visit: Admitting: Licensed Clinical Social Worker

## 2023-01-27 ENCOUNTER — Other Ambulatory Visit (HOSPITAL_COMMUNITY)
Admission: RE | Admit: 2023-01-27 | Discharge: 2023-01-27 | Disposition: A | Discharge status: Home / Self Care | Source: Ambulatory Visit | Attending: Internal Medicine | Admitting: Internal Medicine

## 2023-01-27 ENCOUNTER — Ambulatory Visit (INDEPENDENT_AMBULATORY_CARE_PROVIDER_SITE_OTHER): Admitting: Internal Medicine

## 2023-01-27 VITALS — BP 107/55 | HR 56 | Temp 97.7°F | Ht 64.0 in | Wt 176.7 lb

## 2023-01-27 DIAGNOSIS — M25561 Pain in right knee: Secondary | ICD-10-CM

## 2023-01-27 DIAGNOSIS — Z124 Encounter for screening for malignant neoplasm of cervix: Secondary | ICD-10-CM | POA: Diagnosis present

## 2023-01-27 DIAGNOSIS — Z8639 Personal history of other endocrine, nutritional and metabolic disease: Secondary | ICD-10-CM | POA: Diagnosis not present

## 2023-01-27 NOTE — Progress Notes (Signed)
Subjective:  CC: pap smear, Iron labs  HPI:  [name] is a 47 y.o. female with a past medical history stated below and presents today for cervical cancer screening and follow-up on iron deficiency anemia.   Please see problem based assessment and plan for additional details.  Past Medical History:  Diagnosis Date   Anxiety    Arthritis    Asthma    Bursitis    Right Hip and Right Knee   Depression    H. pylori infection    s/p triple therapy x 2   Migraines     Current Outpatient Medications on File Prior to Visit  Medication Sig Dispense Refill   acetaminophen (TYLENOL) 500 MG tablet Take 2 tablets (1,000 mg total) by mouth every 6 (six) hours as needed. 30 tablet 0   Acetaminophen-guaiFENesin (MUCINEX COLD & FLU) 325-200 MG CAPS Take 2 tablets by mouth daily as needed (cold symptoms).     albuterol (VENTOLIN HFA) 108 (90 Base) MCG/ACT inhaler Inhale 2 puffs into the lungs every 6 (six) hours as needed for wheezing or shortness of breath. 18 g 0   brompheniramine-pseudoephedrine-DM 30-2-10 MG/5ML syrup Take 5 mLs by mouth 4 (four) times daily as needed. 140 mL 0   Budeson-Glycopyrrol-Formoterol (BREZTRI AEROSPHERE) 160-9-4.8 MCG/ACT AERO Inhale 2 puffs into the lungs in the morning and at bedtime. 10.7 g 5   busPIRone (BUSPAR) 10 MG tablet TAKE 2 TABLETS BY MOUTH 3 TIMES DAILY. 540 tablet 1   diclofenac sodium (VOLTAREN) 1 % GEL Apply 2 g topically 4 (four) times daily. (Patient taking differently: Apply 2 g topically 4 (four) times daily as needed (pain).) 300 g 1   escitalopram (LEXAPRO) 10 MG tablet TAKE 2 TABLETS BY MOUTH EVERY DAY 180 tablet 1   fluticasone (FLONASE) 50 MCG/ACT nasal spray Place 1 spray into both nostrils daily.     gabapentin (NEURONTIN) 400 MG capsule Take 1 capsule (400 mg total) by mouth 3 (three) times daily. 90 capsule 2   levalbuterol (XOPENEX HFA) 45 MCG/ACT inhaler Inhale 2 puffs into the lungs every 6 (six) hours as needed for  wheezing or shortness of breath. 1 each 12   Magnesium 400 MG TABS Take 400 mg by mouth daily. 90 tablet 3   Melatonin 10 MG CAPS Take 10 mg by mouth at bedtime.     metoprolol succinate (TOPROL-XL) 50 MG 24 hr tablet Take 1 tablet (50 mg total) by mouth daily. Take with or immediately following a meal. 90 tablet 3   pantoprazole (PROTONIX) 40 MG tablet Take 1 tablet (40 mg total) by mouth daily. 90 tablet 1   ramelteon (ROZEREM) 8 MG tablet TAKE 1 TABLET (8 MG TOTAL) BY MOUTH AT BEDTIME AS NEEDED FOR SLEEP. TAKE 30 MINUTES BEFORE BEDTIME. 90 tablet 1   traZODone (DESYREL) 100 MG tablet Take 1 tablet (100 mg total) by mouth at bedtime. 90 tablet 3   No current facility-administered medications on file prior to visit.    Family History  Problem Relation Age of Onset   Depression Mother    Depression Daughter    Colon cancer Neg Hx    Esophageal cancer Neg Hx    Gastric cancer Neg Hx    Inflammatory bowel disease Neg Hx    Liver disease Neg Hx    Pancreatic cancer Neg Hx    Rectal cancer Neg Hx    Stomach cancer Neg Hx     Social History   Socioeconomic  History   Marital status: Married    Spouse name: Bitania Shankland   Number of children: 1   Years of education: Not on file   Highest education level: Not on file  Occupational History   Occupation: [employer]  Tobacco Use   Smoking status: Former    Current packs/day: 0.00    Types: Cigarettes    Quit date: 12/03/2021    Years since quitting: 1.1   Smokeless tobacco: Never   Tobacco comments:    Trying   Vaping Use   Vaping status: Never Used  Substance and Sexual Activity   Alcohol use: No   Drug use: No   Sexual activity: Yes    Birth control/protection: I.U.D., Surgical    Comment: husband has had a vasectomy  Other Topics Concern   Not on file  Social History Narrative   Right handed    Lives with Cushing Dogan   Caffeine use: coffee-daily   Social Determinants of Health   Financial Resource Strain: Low Risk   (04/16/2021)   Received from [facility], [facility]   Overall Financial Resource Strain (CARDIA)    Difficulty of Paying Living Expenses: Not hard at all  Food Insecurity: No Food Insecurity (12/11/2021)   Hunger Vital Sign    Worried About Running Out of Food in the Last Year: Never true    Ran Out of Food in the Last Year: Never true  Transportation Needs: No Transportation Needs (12/11/2021)   PRAPARE - Administrator, Civil Service (Medical): No    Lack of Transportation (Non-Medical): No  Physical Activity: Sufficiently Active (10/31/2020)   [facility] from [facility], [facility]   Exercise Vital Sign    Days of Exercise per Week: 5 days    Minutes of Exercise per Session: 30 min  Stress: Stress Concern Present (10/31/2020)   Received from [facility], [facility] of Occupational Health - Occupational Stress Questionnaire    Feeling of Stress : To some extent  Social Connections: Moderately Integrated (10/31/2020)   Received from [facility], [facility]   Social Connection and Isolation Panel [NHANES]    Frequency of Communication with Friends and Family: Once a week    Frequency of Social Gatherings with Friends and Family: Once a week    Attends Religious Services: 1 to 4 times per year    Active Member of [employer] or Organizations: No    Attends Engineer, structural: 1 to 4 times per year    Marital Status: Married  Catering manager Violence: Not At Risk (12/11/2021)   Humiliation, Afraid, Rape, and Kick questionnaire    Fear of Current or Ex-Partner: No    Emotionally Abused: No    Physically Abused: No    Sexually Abused: No    Review of Systems: ROS negative except for what is noted on the assessment and plan.  Objective:   Vitals:   01/27/23 1052  BP: (!) 107/55  Pulse: (!) 56  Temp: 97.7 F (36.5 C)  TempSrc: Oral  SpO2: 99%  Weight: 176 lb 11.2 oz (80.2 kg)  Height: 5\' 4"  (1.626 m)     Physical Exam: Constitutional: well-appearing  Cardiovascular: regular rate and rhythm, no m/r/g Pulmonary/Chest: normal work of breathing on room air, lungs clear to auscultation bilaterally Abdominal: soft, non-tender, non-distended MSK: very small effusion present to right knee without erythema or warmth, slight decrease in  flexion due to pain GU: normal appearing external gentilia, normal appearing vaginal mucosa and cervix.  no discharge or blood noted in vaginal vault.  Pap smear and obtained. Female nursing staff present during patient's pelvic examination Skin: warm and dry   Assessment & Plan:  History of iron deficiency Unclear etiology EGD/CSY 3/24 without active bleeding or IBD. Biopsy of duodenum were not consistent with celiac. She has not tolerated PO iron due to constipation. P: Iron studies show iron deficiency with iron saturation of 6%, hgb at 11.3 Ganzoni equation with 600mg  deficit IV iron with ferrlicit 250 mg x2 ordered F/u in 6 weeks to repeat iron studies  Cervical cancer screening Last pap in 2019 was negative with co-testing. She has never had abnormal pap. She denies vaginal symptoms or discharge. -PAP with HPV and cytology sent  Right knee pain 1 day history of right knee pain. She denies trauma or fall. No prior injury to this knee. She feels that since she's had more problems with right foot 2/2 to morton's neuroma knee and hip have been hurting. She denies nausea, fevers, and chills. A: Very small effusion present to right knee, TTP to anterior knee but no warmth or erythem. Suspicion for septic joint is low.  P: Encouraged patient to return to clinic if swelling worsened or knee became red/warm  Patient discussed with Dr. Wille Celeste Amneet Cendejas, D.O. [facility]  PGY-3 Pager: [phone]  Phone: [phone] Date 01/28/2023  Time 7:32 PM

## 2023-01-27 NOTE — Patient Instructions (Addendum)
Thank you, Ms.Shyana R. Hukill for allowing [place] to provide your care today.   Pap smear- I will call with results of this.  Iron labs- if still low then next step is IV iron.  If your knee pain worsens please give the clinic a call. For now you can try compression sleeve, tylenol and ice  I have ordered the following labs for you:   Lab Orders         Iron, TIBC and Ferritin Panel         CBC no Diff       I have ordered the following medication/changed the following medications:   Stop the following medications: Medications Discontinued During This Encounter  Medication Reason   methylPREDNISolone (MEDROL DOSEPAK) 4 MG TBPK tablet    hydrocortisone (ANUSOL-HC) 25 MG suppository    ferrous sulfate 325 (65 FE) MG tablet      Start the following medications: No orders of the defined types were placed in this encounter.    Follow up: 3 months   We look forward to seeing you next time. Please call our clinic at [phone] if you have any questions or concerns. The best time to call is Monday-Friday from 9am-4pm, but there is someone available 24/7. If after hours or the weekend, call the main hospital number and ask for the Internal Medicine Resident On-Call. If you need medication refills, please notify your pharmacy one week in advance and they will send [place] a request.   Thank you for trusting me with your care. Wishing you the best!   Rudene Christians, DO [facility]

## 2023-01-28 ENCOUNTER — Encounter: Admitting: Internal Medicine

## 2023-01-28 DIAGNOSIS — M25561 Pain in right knee: Secondary | ICD-10-CM | POA: Insufficient documentation

## 2023-01-28 DIAGNOSIS — Z124 Encounter for screening for malignant neoplasm of cervix: Secondary | ICD-10-CM | POA: Insufficient documentation

## 2023-01-28 LAB — CBC
Hematocrit: 36.2 % (ref 34.0–46.6)
Hemoglobin: 11.3 g/dL (ref 11.1–15.9)
MCH: 25.2 pg — ABNORMAL LOW (ref 26.6–33.0)
MCHC: 31.2 g/dL — ABNORMAL LOW (ref 31.5–35.7)
MCV: 81 fL (ref 79–97)
Platelets: 237 10*3/uL (ref 150–450)
RBC: 4.49 x10E6/uL (ref 3.77–5.28)
RDW: 15.7 % — ABNORMAL HIGH (ref 11.7–15.4)
WBC: 10.5 10*3/uL (ref 3.4–10.8)

## 2023-01-28 LAB — IRON,TIBC AND FERRITIN PANEL
Ferritin: 8 ng/mL — ABNORMAL LOW (ref 15–150)
Iron Saturation: 6 % — CL (ref 15–55)
Iron: 25 ug/dL — ABNORMAL LOW (ref 27–159)
Total Iron Binding Capacity: 442 ug/dL (ref 250–450)
UIBC: 417 ug/dL (ref 131–425)

## 2023-01-28 NOTE — Assessment & Plan Note (Signed)
1 day history of right knee pain. She denies trauma or fall. No prior injury to this knee. She feels that since she's had more problems with right foot 2/2 to morton's neuroma knee and hip have been hurting. She denies nausea, fevers, and chills. A: Very small effusion present to right knee, TTP to anterior knee but no warmth or erythem. Suspicion for septic joint is low.  P: Encouraged patient to return to clinic if swelling worsened or knee became red/warm

## 2023-01-28 NOTE — Assessment & Plan Note (Signed)
Last pap in 2019 was negative with co-testing. She has never had abnormal pap. She denies vaginal symptoms or discharge. -PAP with HPV and cytology sent

## 2023-01-28 NOTE — Assessment & Plan Note (Signed)
Unclear etiology EGD/CSY 3/24 without active bleeding or IBD. Biopsy of duodenum were not consistent with celiac. She has not tolerated PO iron due to constipation. P: Iron studies show iron deficiency with iron saturation of 6%, hgb at 11.3 Ganzoni equation with 600mg  deficit IV iron with ferrlicit 250 mg x2 ordered F/u in 6 weeks to repeat iron studies

## 2023-01-31 LAB — CYTOLOGY - PAP
Adequacy: ABSENT
Comment: NEGATIVE
Diagnosis: NEGATIVE
Diagnosis: REACTIVE
High risk HPV: NEGATIVE

## 2023-02-01 ENCOUNTER — Encounter: Payer: Self-pay | Admitting: Orthopedic Surgery

## 2023-02-01 ENCOUNTER — Telehealth: Payer: Self-pay | Admitting: *Deleted

## 2023-02-01 ENCOUNTER — Ambulatory Visit (INDEPENDENT_AMBULATORY_CARE_PROVIDER_SITE_OTHER): Admitting: Licensed Clinical Social Worker

## 2023-02-01 DIAGNOSIS — F418 Other specified anxiety disorders: Secondary | ICD-10-CM | POA: Diagnosis not present

## 2023-02-01 NOTE — Telephone Encounter (Deleted)
-----   Message from [place] sent at 02/01/2023  3:02 PM EST ----- Regarding: RE: IV iron Yes it is ferrlicit ----- Message ----- From: Hassan Buckler, RN Sent: 02/01/2023   2:32 PM EST To: Rudene Christians, DO Subject: RE: IV iron                                    I cannot see it but this does not mean it's not in there. And will this be ferrlicit? ----- Message ----- From: Rudene Christians, DO Sent: 02/01/2023   1:56 PM EST To: Hassan Buckler, RN Subject: RE: IV iron                                    Yes! I called and talked with her about this yesterday. It will actually be 2 infusion times with 500 mg. I placed the order, are you able to see it? ----- Message ----- From: Hassan Buckler, RN Sent: 02/01/2023   1:47 PM EST To: Rudene Christians, DO Subject: RE: IV iron                                    Do you still want her scheduled for iron infusion? Taylorann Tkach ----- Message ----- From: Rudene Christians, DO Sent: 01/31/2023   1:58 PM EST To: Hassan Buckler, RN Subject: RE: IV iron                                    1 infusion, I need to call her today about this ----- Message ----- From: Hassan Buckler, RN Sent: 01/31/2023   1:53 PM EST To: Rudene Christians, DO Subject: RE: IV iron                                    Dr Masters How many infusions and will it for Ferrlicit ? Madelynn Malson ----- Message ----- From: Rudene Christians, DO Sent: 01/28/2023   7:20 PM EST To: Imp Triage Nurse Pool Subject: IV iron                                        Please contact infusion clinic to help coordinate with patient.

## 2023-02-01 NOTE — Progress Notes (Signed)
Internal Medicine Clinic Attending  I was physically present during the key portions of the resident provided service and participated in the medical decision making of patient's management care. I reviewed pertinent patient test results.  The assessment, diagnosis, and plan were formulated together and I agree with the documentation in the resident's note.  Reymundo Poll, MD

## 2023-02-01 NOTE — Telephone Encounter (Signed)
-----   Message from [place] sent at 02/01/2023  3:02 PM EST ----- Regarding: RE: IV iron Yes it is ferrlicit ----- Message ----- From: Hassan Buckler, RN Sent: 02/01/2023   2:32 PM EST To: Rudene Christians, DO Subject: RE: IV iron                                    I cannot see it but this does not mean it's not in there. And will this be ferrlicit? ----- Message ----- From: Rudene Christians, DO Sent: 02/01/2023   1:56 PM EST To: Hassan Buckler, RN Subject: RE: IV iron                                    Yes! I called and talked with her about this yesterday. It will actually be 2 infusion times with 500 mg. I placed the order, are you able to see it? ----- Message ----- From: Hassan Buckler, RN Sent: 02/01/2023   1:47 PM EST To: Rudene Christians, DO Subject: RE: IV iron                                    Do you still want her scheduled for iron infusion? Kimberly Terrell ----- Message ----- From: Rudene Christians, DO Sent: 01/31/2023   1:58 PM EST To: Hassan Buckler, RN Subject: RE: IV iron                                    1 infusion, I need to call her today about this ----- Message ----- From: Hassan Buckler, RN Sent: 01/31/2023   1:53 PM EST To: Rudene Christians, DO Subject: RE: IV iron                                    Dr Masters How many infusions and will it for Ferrlicit ? Kimberly Terrell ----- Message ----- From: Rudene Christians, DO Sent: 01/28/2023   7:20 PM EST To: Imp Triage Nurse Pool Subject: IV iron                                        Please contact infusion clinic to help coordinate with patient.

## 2023-02-01 NOTE — Telephone Encounter (Signed)
Pt stated a Friday appt would be best for her IV iron infusion. I called [facility], left message pt needs an appt.

## 2023-02-01 NOTE — BH Specialist Note (Signed)
Integrated Behavioral Health via Telemedicine Visit  02/01/2023 Kimberly Terrell [id number]  Number of Integrated Behavioral Health Clinician visits: Additional Visit  Session Start time: 0900   Session End time: 0930  Total time in minutes: 30   Referring Provider: PCP Patient/Family location: Home [facility] Provider location: Office All persons participating in visit: Patient and [facility] Types of Service: Individual psychotherapy  I connected with Marlina R. Tigert via  Telephone  and verified that I am speaking with the correct person using two identifiers. Discussed confidentiality: Yes   I discussed the limitations of telemedicine and the availability of in person appointments.  Discussed there is a possibility of technology failure and discussed alternative modes of communication if that failure occurs.  I discussed that engaging in this telemedicine visit, they consent to the provision of behavioral healthcare and the services will be billed under their insurance.  Patient and/or legal guardian expressed understanding and consented to Telemedicine visit: Yes   Presenting Concerns: Patient and/or family reports the following symptoms/concerns: The patient reported that she is "doing the same" and is currently taking her granddaughters to a dentist appointment. She expressed a preference for an in-person visit on December 9th at 1:30 PM, during which she will bring her granddaughter, who will be turning 58 years old. The patient continues to seek self-care strategies and activities that bring her joy, despite ongoing challenges with migraines and a recent illness. She visited urgent care recently, where she was prescribed medication for her migraine symptoms. Overall, the patient is managing multiple responsibilities while navigating her health concerns, and further discussion regarding effective migraine management and self-care will be addressed in the upcoming appointment.   Patient  and/or Family's Strengths/Protective Factors: Social connections  Goals Addressed: Patient will:  Reduce symptoms of: stress   Increase knowledge and/or ability of: coping skills   Demonstrate ability to: Increase healthy adjustment to current life circumstances  Progress towards Goals: Ongoing  Interventions: Interventions utilized:  CBT Cognitive Behavioral Therapy Standardized Assessments completed: Not Needed  Patient and/or Family Response: Patient agrees to continued therapy  Assessment: Patient currently experiencing stress.   Patient may benefit from Ongoing therapy.  Plan: Follow up with behavioral health clinician on : December 9   I discussed the assessment and treatment plan with the patient and/or parent/guardian. They were provided an opportunity to ask questions and all were answered. They agreed with the plan and demonstrated an understanding of the instructions.   They were advised to call back or seek an in-person evaluation if the symptoms worsen or if the condition fails to improve as anticipated.  Christen Butter, MSW, LCSW-A She/Her Behavioral Health Clinician [facility]  Internal Medicine Center Direct Dial:[phone]  Fax [phone] Main Office Phone: [phone] [address] [place] [place], [place] [address] Website: [facility] Internal Medicine [facility]  [place], [place]  [place]

## 2023-02-02 ENCOUNTER — Encounter (HOSPITAL_BASED_OUTPATIENT_CLINIC_OR_DEPARTMENT_OTHER): Payer: Self-pay | Admitting: Student

## 2023-02-02 ENCOUNTER — Ambulatory Visit (HOSPITAL_BASED_OUTPATIENT_CLINIC_OR_DEPARTMENT_OTHER)

## 2023-02-02 ENCOUNTER — Ambulatory Visit (HOSPITAL_BASED_OUTPATIENT_CLINIC_OR_DEPARTMENT_OTHER): Admitting: Student

## 2023-02-02 DIAGNOSIS — M25531 Pain in right wrist: Secondary | ICD-10-CM

## 2023-02-02 DIAGNOSIS — M79641 Pain in right hand: Secondary | ICD-10-CM

## 2023-02-02 MED ORDER — TRAMADOL HCL 50 MG PO TABS: 50.0000 mg | ORAL_TABLET | Freq: Four times a day (QID) | ORAL | 0 refills | Status: AC | PRN

## 2023-02-02 NOTE — Progress Notes (Signed)
Chief Complaint: Right wrist pain     History of Present Illness:    Kimberly Terrell is a 47 y.o. female presents today with 3-day history of pain in her right wrist.  Denies any injury but states that her wrist was painful when she woke up in the morning.  States that pain is located diffusely throughout the wrist and extends into the fingers.  No associated numbness or tingling.  She is unable to form a fist.  Pain level is moderate to severe depending on activity.  She is unable to take NSAIDs due to history of stomach perforation, so she has been using lidocaine patches, Voltaren gel, and a wrist brace.  She reports currently taking prednisone for an upper respiratory infection.  Denies history of gout.  Negative for fever or chills.   Surgical History:   None  PMH/PSH/Family History/Social History/Meds/Allergies:    Past Medical History:  Diagnosis Date   Anxiety    Arthritis    Asthma    Bursitis    Right Hip and Right Knee   Depression    H. pylori infection    s/p triple therapy x 2   Migraines    Past Surgical History:  Procedure Laterality Date   COSMETIC SURGERY  2008   breast implants    gastric ulcer surgery     gastric ulcer perforation surgery   LAPAROTOMY N/A 12/04/2021   Procedure: EXPLORATORY LAPAROTOMY;  Surgeon: Harriette Bouillon, MD;  Location: [facility] OR;  Service: General;  Laterality: N/A;   Social History   Socioeconomic History   Marital status: Married    Spouse name: Donielle Kaigler   Number of children: 1   Years of education: Not on file   Highest education level: Not on file  Occupational History   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, advertising  Tobacco Use   Smoking status: Former    Current packs/day: 0.00    Types: Cigarettes    Quit date: 12/03/2021    Years since quitting: 1.1   Smokeless tobacco: Never   Tobacco comments:    Trying   Vaping Use   Vaping status: Never Used  Substance and Sexual Activity   Alcohol use:  No   Drug use: No   Sexual activity: Yes    Birth control/protection: I.U.D., Surgical    Comment: husband has had a vasectomy  Other Topics Concern   Not on file  Social History Narrative   Right handed    Lives with Edgar Beechy   Caffeine use: coffee-daily   Social Determinants of Health   Financial Resource Strain: Low Risk  (04/16/2021)   Received [facility], [facility]   Overall Financial Resource Strain (CARDIA)    Difficulty of Paying Living Expenses: Not hard at all  Food Insecurity: No Food Insecurity (12/11/2021)   Hunger Vital Sign    Worried About Running Out of Food in the Last Year: Never true    Ran Out of Food in the Last Year: Never true  Transportation Needs: No Transportation Needs (12/11/2021)   PRAPARE - Administrator, Civil Service (Medical): No    Lack of Transportation (Non-Medical): No  Physical Activity: Sufficiently Active (10/31/2020)   [facility], [facility]   Exercise Vital Sign  Days of Exercise per Week: 5 days    Minutes of Exercise per Session: 30 min  Stress: Stress Concern Present (10/31/2020)   Received from [facility], [facility] of Occupational Health - Occupational Stress Questionnaire    Feeling of Stress : To some extent  Social Connections: Moderately Integrated (10/31/2020)   Received from [facility]   Social Connection and Isolation Panel [NHANES]    Frequency of Communication with Friends and Family: Once a week    Frequency of Social Gatherings with Friends and Family: Once a week    Attends Religious Services: 1 to 4 times per year    Active Member of [employer] or Organizations: No    Attends Engineer, structural: 1 to 4 times per year    Marital Status: Married   Family History  Problem Relation Age of Onset   Depression Mother    Depression Daughter    Colon cancer Neg Hx    Esophageal cancer Neg Hx    Gastric cancer  Neg Hx    Inflammatory bowel disease Neg Hx    Liver disease Neg Hx    Pancreatic cancer Neg Hx    Rectal cancer Neg Hx    Stomach cancer Neg Hx    Allergies  Allergen Reactions   Imitrex [Sumatriptan] Other (See Comments)    "Gave me lockjaw"   Tape Other (See Comments)    The "plastic-like tape causes redness and irritation"   Aspirin Other (See Comments)    Caused chest pain   Current Outpatient Medications  Medication Sig Dispense Refill   traMADol (ULTRAM) 50 MG tablet Take 1 tablet (50 mg total) by mouth every 6 (six) hours as needed for up to 5 days. 20 tablet 0   acetaminophen (TYLENOL) 500 MG tablet Take 2 tablets (1,000 mg total) by mouth every 6 (six) hours as needed. 30 tablet 0   Acetaminophen-guaiFENesin (MUCINEX COLD & FLU) 325-200 MG CAPS Take 2 tablets by mouth daily as needed (cold symptoms).     albuterol (VENTOLIN HFA) 108 (90 Base) MCG/ACT inhaler Inhale 2 puffs into the lungs every 6 (six) hours as needed for wheezing or shortness of breath. 18 g 0   brompheniramine-pseudoephedrine-DM 30-2-10 MG/5ML syrup Take 5 mLs by mouth 4 (four) times daily as needed. 140 mL 0   Budeson-Glycopyrrol-Formoterol (BREZTRI AEROSPHERE) 160-9-4.8 MCG/ACT AERO Inhale 2 puffs into the lungs in the morning and at bedtime. 10.7 g 5   busPIRone (BUSPAR) 10 MG tablet TAKE 2 TABLETS BY MOUTH 3 TIMES DAILY. 540 tablet 1   diclofenac sodium (VOLTAREN) 1 % GEL Apply 2 g topically 4 (four) times daily. (Patient taking differently: Apply 2 g topically 4 (four) times daily as needed (pain).) 300 g 1   escitalopram (LEXAPRO) 10 MG tablet TAKE 2 TABLETS BY MOUTH EVERY DAY 180 tablet 1   fluticasone (FLONASE) 50 MCG/ACT nasal spray Place 1 spray into both nostrils daily.     gabapentin (NEURONTIN) 400 MG capsule Take 1 capsule (400 mg total) by mouth 3 (three) times daily. 90 capsule 2   levalbuterol (XOPENEX HFA) 45 MCG/ACT inhaler Inhale 2 puffs into the lungs every 6 (six) hours as needed for  wheezing or shortness of breath. 1 each 12   Magnesium 400 MG TABS Take 400 mg by mouth daily. 90 tablet 3   Melatonin 10 MG CAPS Take 10 mg by mouth at bedtime.  metoprolol succinate (TOPROL-XL) 50 MG 24 hr tablet Take 1 tablet (50 mg total) by mouth daily. Take with or immediately following a meal. 90 tablet 3   pantoprazole (PROTONIX) 40 MG tablet Take 1 tablet (40 mg total) by mouth daily. 90 tablet 1   ramelteon (ROZEREM) 8 MG tablet TAKE 1 TABLET (8 MG TOTAL) BY MOUTH AT BEDTIME AS NEEDED FOR SLEEP. TAKE 30 MINUTES BEFORE BEDTIME. 90 tablet 1   traZODone (DESYREL) 100 MG tablet Take 1 tablet (100 mg total) by mouth at bedtime. 90 tablet 3   No current facility-administered medications for this visit.   No results found.  Review of Systems:   A ROS was performed including pertinent positives and negatives as documented in the HPI.  Physical Exam :   Constitutional: NAD and appears stated age Neurological: Alert and oriented Psych: Appropriate affect and cooperative There were no vitals taken for this visit.   Comprehensive Musculoskeletal Exam:    Tenderness to palpation diffusely through the the right distal forearm and wrist.  No evidence of erythema, ecchymosis, or warmth.  Passive wrist range of motion limited to 10 degrees extension and 30 degrees flexion due to pain.  Full ROM without discomfort with radial and ulnar deviation.  Unable to make a full fist.  Negative Finkelstein and Tinel's test.  Mild swelling noted in all 5 distal fingertips.  Brisk capillary refill and 2+ radial pulse.  Distal neurosensory exam intact.  Imaging:   Xray (left wrist 4 views, left hand 3 views): Negative for bony abnormality   I personally reviewed and interpreted the radiographs.   Assessment:   47 y.o. female with acute atraumatic right wrist pain that began 3 days ago.  X-rays taken today do not show any evidence of arthritis or acute bony abnormality.  This point I am less  suspicious of tendinitis as she does not have history of gout and testing does not suggest carpal tunnel.  She can continue with Voltaren gel for anti-inflammatory effects and I will send her a few days of tramadol to help with pain.  Recommend continuing the wrist brace.  I would like to have her return to clinic if symptoms do not improve over the next 1 to 2 weeks and could consider steroid injection if needed.  Plan :    -Continue wrist bracing and start tramadol 50 mg as needed -Return to clinic for reevaluation should symptoms persist or worsen     I personally saw and evaluated the patient, and participated in the management and treatment plan.  Hazle Nordmann, PA-C Orthopedics

## 2023-02-02 NOTE — Telephone Encounter (Signed)
IV iron appt scheduled for Friday 11/22 @ 0900 am here at [facility] IV infusion clinic. Pt called and informed to arrive early to register at Admissions prior to her appt.

## 2023-02-03 ENCOUNTER — Telehealth: Payer: Self-pay | Admitting: *Deleted

## 2023-02-03 ENCOUNTER — Encounter: Payer: Self-pay | Admitting: Student

## 2023-02-03 ENCOUNTER — Ambulatory Visit: Admitting: Internal Medicine

## 2023-02-03 DIAGNOSIS — R058 Other specified cough: Secondary | ICD-10-CM

## 2023-02-03 NOTE — Telephone Encounter (Signed)
Call from patient states is not feeling any better after being seen last week.  Unable to sleep due to her cough.  Coughing up greenish phlegm.  Is still having Chills ans sweats.  Cough medication given is not working.  Wants something else if possible.  No available appointments today or tomorrow.

## 2023-02-03 NOTE — Assessment & Plan Note (Addendum)
Patient started having post nasal drip and sore throat last week around 10/29- 10/31. 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 At visit 10/31, oxygen saturations were stable, oropharnx was non-erythematous, TM without effusions bilaterally, and respiratory effort was normal with no wheezing or rhonchi appreciated. Since last week, she has had worsening productive cough and has difficulty sleeping due to this. She has not been febrile but is taking tylenol, is having chills/ feels clammy. She is having to use albuterol rescue inhaler more frequently. Is using brompheniramine-pseudoephedrine-DM without improvement in symptoms. She is also having sinus congestion with pain. COVID test from a few days ago was negative. A: With history of asthma, I am concerned for asthma exacerbation. She does not have a way to check her pulse ox at home and endorses more frequent use of albuterol today. She has not been ambulating due to fatigue and shortness of breath. This could also be bronchitis versus CAP. P: With concern for hypoxia and limitation of tele visit for physical exam, I encouraged patient to go to urgent care for further evaluation. She is currently taking care of her 2 granddaughters, one of whom is also currently sick. Her husband is on phone and was concerned about if she would be able to make it to urgent care

## 2023-02-03 NOTE — Progress Notes (Signed)
   I connected with  Aubryana R. Wooley on 02/03/23 by telephone and verified that I am speaking with the correct person using two identifiers.   I discussed the limitations of evaluation and management by telemedicine. The patient expressed understanding and agreed to proceed.  CC: productive cough, Shortness of breath  This is a telephone encounter between [employer]. [name] and Shauntee Karp on 02/03/2023 for productive cough and shortness of breath. The visit was conducted with the patient located at home and Rudene Christians at [facility]. The patient's identity was confirmed using their DOB and current address. The patient has consented to being evaluated through a telephone encounter and understands the associated risks (an examination cannot be done and the patient may need to come in for an appointment) / benefits (allows the patient to remain at home, decreasing exposure to coronavirus). I personally spent 10 minutes on medical discussion.   HPI:  [name] is a 47 y.o. with PMH of asthma   Please see A&P for assessment of the patient's acute and chronic medical conditions.   Past Medical History:  Diagnosis Date   Anxiety    Arthritis    Asthma    Bursitis    Right Hip and Right Knee   Depression    H. pylori infection    s/p triple therapy x 2   Migraines    Review of Systems:   + chills, SOB, sinus congestion, productive cough - fevers  Assessment & Plan:   Productive cough Patient started having post nasal drip and sore throat last week around 10/29- 10/31. 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 At visit 10/31, oxygen saturations were stable, oropharnx was non-erythematous, TM without effusions bilaterally, and respiratory effort was normal with no wheezing or rhonchi appreciated. Since last week, she has had worsening productive cough and has difficulty sleeping due to this. She has not been febrile but  is taking tylenol, is having chills/ feels clammy. She is having to use albuterol rescue inhaler more frequently. Is using brompheniramine-pseudoephedrine-DM without improvement in symptoms. She is also having sinus congestion with pain. COVID test from a few days ago was negative. A: With history of asthma, I am concerned for asthma exacerbation. She does not have a way to check her pulse ox at home and endorses more frequent use of albuterol today. She has not been ambulating due to fatigue and shortness of breath. This could also be bronchitis versus CAP. P: With concern for hypoxia and limitation of tele visit for physical exam, I encouraged patient to go to urgent care for further evaluation. She is currently taking care of her 2 granddaughters, one of whom is also currently sick. Her husband is on phone and was concerned about if she would be able to make it to urgent care    Patient discussed with Dr. Wille Celeste Jaamal Farooqui, D.O. [facility] Internal Medicine  PGY-3 Pager: [phone]  Phone: [phone] Date 02/03/2023  Time 4:06 PM

## 2023-02-04 ENCOUNTER — Ambulatory Visit
Admission: RE | Admit: 2023-02-04 | Discharge: 2023-02-04 | Disposition: A | Discharge status: Home / Self Care | Source: Ambulatory Visit | Attending: Internal Medicine

## 2023-02-04 DIAGNOSIS — Z0000 Encounter for general adult medical examination without abnormal findings: Secondary | ICD-10-CM

## 2023-02-11 ENCOUNTER — Encounter (HOSPITAL_COMMUNITY)

## 2023-02-18 ENCOUNTER — Other Ambulatory Visit: Payer: Self-pay

## 2023-02-18 ENCOUNTER — Inpatient Hospital Stay (HOSPITAL_COMMUNITY): Admission: RE | Admit: 2023-02-18 | Source: Ambulatory Visit

## 2023-02-18 ENCOUNTER — Encounter (HOSPITAL_COMMUNITY): Payer: Self-pay | Admitting: Orthopedic Surgery

## 2023-02-18 NOTE — Progress Notes (Signed)
SDW CALL  Patient was given pre-op instructions over the phone. The opportunity was given for the patient to ask questions. No further questions asked. Patient verbalized understanding of instructions given.   PCP -  Cardiologist - Dr. Nelly Laurence  PPM/ICD - denies Device Orders - n/a Rep Notified - n/a  Chest x-ray - 07/05/2022 EKG - 11/15/2022 Stress Test - denies ECHO - 10/15/2022 Cardiac Cath - denies  Sleep Study - denies CPAP - n/a  Fasting Blood Sugar - n/a   Blood Thinner Instructions: n/a Aspirin Instructions: n/a  ERAS Protcol - clears until 0430   COVID TEST- n/a   Anesthesia review: yes  Patient denies shortness of breath, fever, cough and chest pain over the phone call   All instructions explained to the patient, with a verbal understanding of the material. Patient agrees to go over the instructions while at home for a better understanding.    Patient was recently taking Amoxicillin for an extracted tooth.  Patient states that she has completed the course.

## 2023-02-18 NOTE — Progress Notes (Signed)
Surgical Instructions   Your procedure is scheduled on Wednesday, November 27th, 2024. Report to [facility] Main Entrance "A" at 5:30 A.M., then check in with the Admitting office. Any questions or running late day of surgery: call [phone]  Questions prior to your surgery date: call [phone], Monday-Friday, 8am-4pm. If you experience any cold or flu symptoms such as cough, fever, chills, shortness of breath, etc. between now and your scheduled surgery, please notify us at the above number.     Remember:  Do not eat after midnight the night before your surgery   You may drink clear liquids until 4:30 the morning of your surgery.   Clear liquids allowed are: Water, Non-Citrus Juices (without pulp), Carbonated Beverages, Clear Tea (no milk, honey, etc.), Black Coffee Only (NO MILK, CREAM OR POWDERED CREAMER of any kind), and Gatorade.    Take these medicines the morning of surgery with A SIP OF WATER: Metoprolol Protonix Gabapentin Lexapro Buspar    May take these medicines IF NEEDED: All inhalers Tylenol Tylenol #3 Hydroxyzine     One week prior to surgery, STOP taking any Aspirin (unless otherwise instructed by your surgeon) Aleve, Naproxen, Ibuprofen, Motrin, Advil, Goody's, BC's, all herbal medications, fish oil, and non-prescription vitamins.            You will be asked to remove any contacts, glasses, piercing's, hearing aid's, dentures/partials prior to surgery. Please bring cases for these items if needed.    Patients discharged the day of surgery will not be allowed to drive home, and someone needs to stay with them for 24 hours.  SURGICAL WAITING ROOM VISITATION Patients may have no more than 2 support people in the waiting area - these visitors may rotate.   Pre-op nurse will coordinate an appropriate time for 1 ADULT support person, who may not rotate, to accompany patient in pre-op.  Children under the age of 54 must have an adult with them who is not  the patient and must remain in the main waiting area with an adult.  If the patient needs to stay at the hospital during part of their recovery, the visitor guidelines for inpatient rooms apply.  Please refer to the [facility] website for the visitor guidelines for any additional information.      Additional instructions for the day of surgery: DO NOT APPLY any lotions, deodorants, cologne, or perfumes.   Do not wear jewelry or makeup Do not wear nail polish, gel polish, artificial nails, or any other type of covering on natural nails (fingers and toes) Do not bring valuables to the hospital. [facility] is not responsible for valuables/personal belongings. Put on clean/comfortable clothes.  Please brush your teeth.  Ask your nurse before applying any prescription medications to the skin.

## 2023-02-21 NOTE — Anesthesia Preprocedure Evaluation (Signed)
Anesthesia Evaluation  Patient identified by MRN, date of birth, ID band Patient awake    Reviewed: Allergy & Precautions, NPO status , Patient's Chart, lab work & pertinent test results  Airway Mallampati: II  TM Distance: >3 FB Neck ROM: Full    Dental no notable dental hx. (+) Teeth Intact, Dental Advisory Given   Pulmonary asthma , Patient abstained from smoking., former smoker   Pulmonary exam normal breath sounds clear to auscultation       Cardiovascular Normal cardiovascular exam Rhythm:Regular Rate:Normal     Neuro/Psych  PSYCHIATRIC DISORDERS Anxiety Depression       GI/Hepatic PUD,GERD  ,,  Endo/Other    Renal/GU Lab Results      Component                Value               Date                      CREATININE               0.78                12/03/2021                BUN                      12                  12/03/2021                NA                       138                 12/03/2021                K                        3.4 (L)             12/03/2021                CL                       104                 12/03/2021                CO2                      19 (L)              12/03/2021                Musculoskeletal   Abdominal Normal abdominal exam  (+)   Peds  Hematology Lab Results      Component                Value               Date                      WBC                      16.1 (H)  12/03/2021                HGB                      12.7                12/03/2021                HCT                      40.4                12/03/2021                MCV                      85.2                12/03/2021                PLT                      567 (H)             12/03/2021              Anesthesia Other Findings   Reproductive/Obstetrics negative OB ROS                             Anesthesia Physical Anesthesia Plan  ASA: 2  Anesthesia  Plan: General   Post-op Pain Management:    Induction: Intravenous  PONV Risk Score and Plan: 4 or greater and Treatment may vary due to age or medical condition, Ondansetron and Midazolam  Airway Management Planned: LMA  Additional Equipment: None  Intra-op Plan:   Post-operative Plan: Extubation in OR  Informed Consent: I have reviewed the patients History and Physical, chart, labs and discussed the procedure including the risks, benefits and alternatives for the proposed anesthesia with the patient or authorized representative who has indicated his/her understanding and acceptance.     Dental advisory given  Plan Discussed with: CRNA  Anesthesia Plan Comments: (PAT note by Antionette Poles, PA-C:  Follows with cardiology for history of stress-induced cardiomyopathy. She was admitted in September 2023 with peritonitis due to a perforated ulcer complicated by NSTEMI due to stress-induced cardiomyopathy. EF was 40-45%. She was discharged on GDMT. Repeat TTE in November showed that her EF had normalized. There was moderate MR. Last seen by Dr. Nelly Laurence on 11/15/2022.  At that time was noted her symptomatic PVCs had improved with an increase to metoprolol XL to 50 mg.  No further intervention recommended.  She was advised to follow-up with the EP clinic on an as-needed basis.  Patient will need day of surgery labs and evaluation.  EKG 11/15/2022: Normal sinus rhythm.  Rate 63. Rightward axis. Nonspecific ST abnormality  Event monitor 09/23/2022: *The predominant rhythm was Sinus with Frequent Ventricular Ectopy. *The Maximum Heart Rate recorded was 214 bpm, 06/09 18:55:55, the Minimum Heart Rate recorded was 53 bpm, 06/14 00:45:59, and the Average Heart Rate was 80 bpm. *There were 60,695 VE beats with a burden of 8 %. There were 1,262 occurrences of Ventricular Tachycardia with the Fastest episode 214 bpm, 06/09 18:55:55, and the Longest episode 10 beats, 06/10 08:39:53. *There were 419  SVE beats with a burden of <1 %. There were  3 occurrences of Supraventricular Tachycardia with the Fastest episode 166 bpm, 06/09 07:51:55, and the Longest episode 5 beats, 06/13 09:22:53.  TTE 10/15/2022: 1. Left ventricular ejection fraction, by estimation, is 60 to 65%. The  left ventricle has normal function. The left ventricle has no regional  wall motion abnormalities. Left ventricular diastolic parameters were  normal.  2. Right ventricular systolic function is normal. The right ventricular  size is normal. There is normal pulmonary artery systolic pressure. The  estimated right ventricular systolic pressure is 29.4 mmHg.  3. Left atrial size was mildly dilated.  4. The mitral valve is normal in structure. Moderate mitral valve  regurgitation. No evidence of mitral stenosis.  5. The aortic valve is normal in structure. Aortic valve regurgitation is  not visualized. No aortic stenosis is present.  6. The inferior vena cava is normal in size with greater than 50%  respiratory variability, suggesting right atrial pressure of 3 mmHg.   )        Anesthesia Quick Evaluation

## 2023-02-21 NOTE — Progress Notes (Signed)
Anesthesia Chart Review: Same day workup  Follows with cardiology for history of stress-induced cardiomyopathy. She was admitted in September 2023 with peritonitis due to a perforated ulcer complicated by NSTEMI due to stress-induced cardiomyopathy. EF was 40-45%. She was discharged on GDMT. Repeat TTE in November showed that her EF had normalized. There was moderate MR. Last seen by Dr. Nelly Laurence on 11/15/2022.  At that time was noted her symptomatic PVCs had improved with an increase to metoprolol XL to 50 mg.  No further intervention recommended.  She was advised to follow-up with the EP clinic on an as-needed basis.  Patient will need day of surgery labs and evaluation.  EKG 11/15/2022: Normal sinus rhythm.  Rate 63. Rightward axis. Nonspecific ST abnormality  Event monitor 09/23/2022: *The predominant rhythm was Sinus with Frequent Ventricular Ectopy. *The Maximum Heart Rate recorded was 214 bpm, 06/09 18:55:55, the Minimum Heart Rate recorded was 53 bpm, 06/14 00:45:59, and the Average Heart Rate was 80 bpm. *There were 60,695 VE beats with a burden of 8 %. There were 1,262 occurrences of Ventricular Tachycardia with the Fastest episode 214 bpm, 06/09 18:55:55, and the Longest episode 10 beats, 06/10 08:39:53. *There were 419 SVE beats with a burden of <1 %. There were 3 occurrences of Supraventricular Tachycardia with the Fastest episode 166 bpm, 06/09 07:51:55, and the Longest episode 5 beats, 06/13 09:22:53.  TTE 10/15/2022:  1. Left ventricular ejection fraction, by estimation, is 60 to 65%. The  left ventricle has normal function. The left ventricle has no regional  wall motion abnormalities. Left ventricular diastolic parameters were  normal.   2. Right ventricular systolic function is normal. The right ventricular  size is normal. There is normal pulmonary artery systolic pressure. The  estimated right ventricular systolic pressure is 29.4 mmHg.   3. Left atrial size was mildly  dilated.   4. The mitral valve is normal in structure. Moderate mitral valve  regurgitation. No evidence of mitral stenosis.   5. The aortic valve is normal in structure. Aortic valve regurgitation is  not visualized. No aortic stenosis is present.   6. The inferior vena cava is normal in size with greater than 50%  respiratory variability, suggesting right atrial pressure of 3 mmHg.     Zannie Cove [facility] Short Stay Center/Anesthesiology Phone [phone] 02/21/2023 10:49 AM

## 2023-02-22 ENCOUNTER — Other Ambulatory Visit: Payer: Self-pay | Admitting: Student

## 2023-02-22 DIAGNOSIS — F411 Generalized anxiety disorder: Secondary | ICD-10-CM

## 2023-02-23 ENCOUNTER — Ambulatory Visit (HOSPITAL_COMMUNITY)
Admission: RE | Admit: 2023-02-23 | Discharge: 2023-02-23 | Disposition: A | Discharge status: Home / Self Care | Attending: Orthopedic Surgery | Admitting: Orthopedic Surgery

## 2023-02-23 ENCOUNTER — Ambulatory Visit (HOSPITAL_COMMUNITY): Admitting: Physician Assistant

## 2023-02-23 ENCOUNTER — Other Ambulatory Visit (HOSPITAL_COMMUNITY): Payer: Self-pay

## 2023-02-23 ENCOUNTER — Other Ambulatory Visit: Payer: Self-pay

## 2023-02-23 ENCOUNTER — Other Ambulatory Visit: Payer: Self-pay | Admitting: Student

## 2023-02-23 ENCOUNTER — Encounter (HOSPITAL_COMMUNITY): Payer: Self-pay | Admitting: Orthopedic Surgery

## 2023-02-23 ENCOUNTER — Encounter (HOSPITAL_COMMUNITY)
Admission: RE | Disposition: A | Discharge status: Home / Self Care | Payer: Self-pay | Source: Home / Self Care | Attending: Orthopedic Surgery

## 2023-02-23 DIAGNOSIS — F419 Anxiety disorder, unspecified: Secondary | ICD-10-CM | POA: Diagnosis not present

## 2023-02-23 DIAGNOSIS — F32A Depression, unspecified: Secondary | ICD-10-CM | POA: Diagnosis not present

## 2023-02-23 DIAGNOSIS — Z87891 Personal history of nicotine dependence: Secondary | ICD-10-CM | POA: Insufficient documentation

## 2023-02-23 DIAGNOSIS — J45909 Unspecified asthma, uncomplicated: Secondary | ICD-10-CM | POA: Insufficient documentation

## 2023-02-23 DIAGNOSIS — K219 Gastro-esophageal reflux disease without esophagitis: Secondary | ICD-10-CM | POA: Diagnosis not present

## 2023-02-23 DIAGNOSIS — Z8711 Personal history of peptic ulcer disease: Secondary | ICD-10-CM | POA: Diagnosis not present

## 2023-02-23 DIAGNOSIS — G4700 Insomnia, unspecified: Secondary | ICD-10-CM

## 2023-02-23 DIAGNOSIS — G5761 Lesion of plantar nerve, right lower limb: Secondary | ICD-10-CM

## 2023-02-23 HISTORY — DX: Cardiac arrhythmia, unspecified: I49.9

## 2023-02-23 HISTORY — DX: Anemia, unspecified: D64.9

## 2023-02-23 HISTORY — PX: EXCISION MORTON'S NEUROMA: SHX5013

## 2023-02-23 LAB — BASIC METABOLIC PANEL
Anion gap: 10 (ref 5–15)
BUN: 9 mg/dL (ref 6–20)
CO2: 21 mmol/L — ABNORMAL LOW (ref 22–32)
Calcium: 9.1 mg/dL (ref 8.9–10.3)
Chloride: 104 mmol/L (ref 98–111)
Creatinine, Ser: 0.84 mg/dL (ref 0.44–1.00)
GFR, Estimated: >60 mL/min (ref >60)
Glucose, Bld: 96 mg/dL (ref 70–99)
Potassium: 4.1 mmol/L (ref 3.5–5.1)
Sodium: 135 mmol/L (ref 135–145)

## 2023-02-23 LAB — POCT PREGNANCY, URINE: Preg Test, Ur: NEGATIVE

## 2023-02-23 SURGERY — EXCISION, MORTON'S NEUROMA
Anesthesia: General | Site: Foot | Laterality: Right

## 2023-02-23 MED ORDER — LIDOCAINE 2% (20 MG/ML) 5 ML SYRINGE
INTRAMUSCULAR | Status: AC
  Filled 2023-02-23: qty 5

## 2023-02-23 MED ORDER — 0.9 % SODIUM CHLORIDE (POUR BTL) OPTIME
TOPICAL | Status: DC | PRN
Start: 2023-02-23 — End: 2023-02-23
  Administered 2023-02-23: 1000 mL

## 2023-02-23 MED ORDER — FENTANYL CITRATE (PF) 100 MCG/2ML IJ SOLN
25.0000 ug | INTRAMUSCULAR | Status: DC | PRN
  Administered 2023-02-23 (×3): 50 ug via INTRAVENOUS

## 2023-02-23 MED ORDER — OXYCODONE HCL 5 MG PO TABS
5.0000 mg | ORAL_TABLET | Freq: Once | ORAL | Status: AC | PRN
  Administered 2023-02-23: 5 mg via ORAL

## 2023-02-23 MED ORDER — CHLORHEXIDINE GLUCONATE 0.12 % MT SOLN
15.0000 mL | Freq: Once | OROMUCOSAL | Status: AC
  Administered 2023-02-23: 15 mL via OROMUCOSAL
  Filled 2023-02-23: qty 15

## 2023-02-23 MED ORDER — FENTANYL CITRATE (PF) 250 MCG/5ML IJ SOLN
INTRAMUSCULAR | Status: DC | PRN
  Administered 2023-02-23: 50 ug via INTRAVENOUS

## 2023-02-23 MED ORDER — ONDANSETRON HCL 4 MG/2ML IJ SOLN
INTRAMUSCULAR | Status: DC | PRN
  Administered 2023-02-23: 4 mg via INTRAVENOUS

## 2023-02-23 MED ORDER — CEFAZOLIN SODIUM 1 G IJ SOLR
INTRAMUSCULAR | Status: AC
  Filled 2023-02-23: qty 20

## 2023-02-23 MED ORDER — OXYCODONE-ACETAMINOPHEN 5-325 MG PO TABS
1.0000 | ORAL_TABLET | ORAL | 0 refills | Status: DC | PRN
  Filled 2023-02-23: qty 30, 5d supply, fill #0

## 2023-02-23 MED ORDER — ONDANSETRON HCL 4 MG/2ML IJ SOLN
INTRAMUSCULAR | Status: AC
  Filled 2023-02-23: qty 2

## 2023-02-23 MED ORDER — PROPOFOL 10 MG/ML IV BOLUS
INTRAVENOUS | Status: AC
  Filled 2023-02-23: qty 20

## 2023-02-23 MED ORDER — LIDOCAINE 2% (20 MG/ML) 5 ML SYRINGE
INTRAMUSCULAR | Status: DC | PRN
  Administered 2023-02-23: 100 mg via INTRAVENOUS

## 2023-02-23 MED ORDER — DEXAMETHASONE SODIUM PHOSPHATE 10 MG/ML IJ SOLN
INTRAMUSCULAR | Status: DC | PRN
  Administered 2023-02-23: 10 mg via INTRAVENOUS

## 2023-02-23 MED ORDER — FENTANYL CITRATE (PF) 250 MCG/5ML IJ SOLN
INTRAMUSCULAR | Status: AC
  Filled 2023-02-23: qty 5

## 2023-02-23 MED ORDER — MEPERIDINE HCL 25 MG/ML IJ SOLN: 6.2500 mg | INTRAMUSCULAR | Status: DC | PRN

## 2023-02-23 MED ORDER — ONDANSETRON HCL 4 MG/2ML IJ SOLN: 4.0000 mg | Freq: Once | INTRAMUSCULAR | Status: DC | PRN

## 2023-02-23 MED ORDER — FENTANYL CITRATE (PF) 100 MCG/2ML IJ SOLN
INTRAMUSCULAR | Status: AC
  Filled 2023-02-23: qty 2

## 2023-02-23 MED ORDER — OXYCODONE HCL 5 MG PO TABS
ORAL_TABLET | ORAL | Status: AC
  Filled 2023-02-23: qty 1

## 2023-02-23 MED ORDER — LACTATED RINGERS IV SOLN: INTRAVENOUS | Status: DC

## 2023-02-23 MED ORDER — ACETAMINOPHEN 160 MG/5ML PO SOLN: 325.0000 mg | ORAL | Status: DC | PRN

## 2023-02-23 MED ORDER — DEXAMETHASONE SODIUM PHOSPHATE 10 MG/ML IJ SOLN
INTRAMUSCULAR | Status: AC
  Filled 2023-02-23: qty 1

## 2023-02-23 MED ORDER — OXYCODONE HCL 5 MG/5ML PO SOLN: 5.0000 mg | Freq: Once | ORAL | Status: AC | PRN

## 2023-02-23 MED ORDER — CEFAZOLIN SODIUM-DEXTROSE 2-3 GM-%(50ML) IV SOLR
INTRAVENOUS | Status: DC | PRN
  Administered 2023-02-23: 2 g via INTRAVENOUS

## 2023-02-23 MED ORDER — LACTATED RINGERS IV SOLN: INTRAVENOUS | Status: DC | PRN

## 2023-02-23 MED ORDER — ORAL CARE MOUTH RINSE: 15.0000 mL | Freq: Once | OROMUCOSAL | Status: AC

## 2023-02-23 MED ORDER — PROPOFOL 10 MG/ML IV BOLUS
INTRAVENOUS | Status: DC | PRN
  Administered 2023-02-23: 250 mg via INTRAVENOUS

## 2023-02-23 MED ORDER — ACETAMINOPHEN 325 MG PO TABS: 325.0000 mg | ORAL_TABLET | ORAL | Status: DC | PRN

## 2023-02-23 SURGICAL SUPPLY — 36 items
BAG COUNTER SPONGE SURGICOUNT (BAG) ×1 IMPLANT
BNDG COHESIVE 6X5 TAN NS LF (GAUZE/BANDAGES/DRESSINGS) IMPLANT
BNDG COHESIVE 6X5 TAN ST LF (GAUZE/BANDAGES/DRESSINGS) IMPLANT
BNDG ESMARK 4X9 LF (GAUZE/BANDAGES/DRESSINGS) ×1 IMPLANT
BNDG GAUZE DERMACEA FLUFF 4 (GAUZE/BANDAGES/DRESSINGS) IMPLANT
COTTON STERILE ROLL (GAUZE/BANDAGES/DRESSINGS) IMPLANT
COVER SURGICAL LIGHT HANDLE (MISCELLANEOUS) ×2 IMPLANT
DRAPE U-SHAPE 47X51 STRL (DRAPES) ×1 IMPLANT
DRSG ADAPTIC 3X8 NADH LF (GAUZE/BANDAGES/DRESSINGS) IMPLANT
DRSG EMULSION OIL 3X3 NADH (GAUZE/BANDAGES/DRESSINGS) IMPLANT
DURAPREP 26ML APPLICATOR (WOUND CARE) ×1 IMPLANT
ELECT REM PT RETURN 9FT ADLT (ELECTROSURGICAL) ×1
ELECTRODE REM PT RTRN 9FT ADLT (ELECTROSURGICAL) ×1 IMPLANT
GAUZE PAD ABD 8X10 STRL (GAUZE/BANDAGES/DRESSINGS) IMPLANT
GAUZE SPONGE 2X2 8PLY STRL LF (GAUZE/BANDAGES/DRESSINGS) IMPLANT
GAUZE SPONGE 4X4 12PLY STRL (GAUZE/BANDAGES/DRESSINGS) IMPLANT
GLOVE BIOGEL PI IND STRL 9 (GLOVE) ×1 IMPLANT
GLOVE SURG ORTHO 9.0 STRL STRW (GLOVE) ×1 IMPLANT
GOWN STRL REUS W/ TWL XL LVL3 (GOWN DISPOSABLE) ×2 IMPLANT
KIT BASIN OR (CUSTOM PROCEDURE TRAY) ×1 IMPLANT
KIT TURNOVER KIT B (KITS) ×1 IMPLANT
MANIFOLD NEPTUNE II (INSTRUMENTS) ×1 IMPLANT
NDL HYPO 25GX1X1/2 BEV (NEEDLE) IMPLANT
NEEDLE HYPO 25GX1X1/2 BEV (NEEDLE) IMPLANT
NS IRRIG 1000ML POUR BTL (IV SOLUTION) ×1 IMPLANT
PACK ORTHO EXTREMITY (CUSTOM PROCEDURE TRAY) ×1 IMPLANT
PAD ARMBOARD 7.5X6 YLW CONV (MISCELLANEOUS) ×2 IMPLANT
PAD CAST 4YDX4 CTTN HI CHSV (CAST SUPPLIES) IMPLANT
SPECIMEN JAR SMALL (MISCELLANEOUS) ×1 IMPLANT
SUT ETHILON 3 0 FSLX (SUTURE) IMPLANT
SUT VIC AB 2-0 FS1 27 (SUTURE) IMPLANT
SYR CONTROL 10ML LL (SYRINGE) IMPLANT
TOWEL GREEN STERILE (TOWEL DISPOSABLE) ×1 IMPLANT
TOWEL GREEN STERILE FF (TOWEL DISPOSABLE) ×1 IMPLANT
TUBE CONNECTING 12X1/4 (SUCTIONS) IMPLANT
WATER STERILE IRR 1000ML POUR (IV SOLUTION) ×1 IMPLANT

## 2023-02-23 NOTE — H&P (Signed)
Kimberly Terrell is an 47 y.o. female.   Chief Complaint: Painful mass second webspace right foot HPI: Patient is seen in follow-up status post MRI scan right foot with soft tissue mass in the second webspace right foot. Patient states she is still has pain with activities of daily living. Patient states she used to dance in high-heeled shoes.   Past Medical History:  Diagnosis Date   Anemia    Anxiety    Arthritis    Asthma    Bursitis    Right Hip and Right Knee   Depression    Dysrhythmia    H. pylori infection    s/p triple therapy x 2   Migraines     Past Surgical History:  Procedure Laterality Date   AUGMENTATION MAMMAPLASTY Bilateral    COSMETIC SURGERY  2008   breast implants    gastric ulcer surgery     gastric ulcer perforation surgery   LAPAROTOMY N/A 12/04/2021   Procedure: EXPLORATORY LAPAROTOMY;  Surgeon: Harriette Bouillon, MD;  Location: [facility] OR;  Service: General;  Laterality: N/A;    Family History  Problem Relation Age of Onset   Depression Mother    Depression Daughter    Colon cancer Neg Hx    Esophageal cancer Neg Hx    Gastric cancer Neg Hx    Inflammatory bowel disease Neg Hx    Liver disease Neg Hx    Pancreatic cancer Neg Hx    Rectal cancer Neg Hx    Stomach cancer Neg Hx    Social History:  reports that she quit smoking about 14 months ago. Her smoking use included cigarettes. She has never used smokeless tobacco. She reports that she does not drink alcohol and does not use drugs.  Allergies:  Allergies  Allergen Reactions   Imitrex [Sumatriptan] Other (See Comments)    "Gave me lockjaw"   Tape Other (See Comments)    The "plastic-like tape causes redness and irritation"   Aspirin Other (See Comments)    Caused chest pain    Medications Prior to Admission  Medication Sig Dispense Refill   acetaminophen (TYLENOL) 500 MG tablet Take 2 tablets (1,000 mg total) by mouth every 6 (six) hours as needed. 30 tablet 0   acetaminophen-codeine  (TYLENOL #3) 300-30 MG tablet Take 0.5 tablets by mouth every 4 (four) hours as needed for severe pain (pain score 7-10) or moderate pain (pain score 4-6).     Acetaminophen-guaiFENesin (MUCINEX COLD & FLU) 325-200 MG CAPS Take 2 tablets by mouth daily as needed (cold symptoms).     albuterol (VENTOLIN HFA) 108 (90 Base) MCG/ACT inhaler Inhale 2 puffs into the lungs every 6 (six) hours as needed for wheezing or shortness of breath. 18 g 0   amoxicillin (AMOXIL) 875 MG tablet Take 875 mg by mouth 2 (two) times daily.     brompheniramine-pseudoephedrine-DM 30-2-10 MG/5ML syrup Take 5 mLs by mouth 4 (four) times daily as needed. 140 mL 0   Budeson-Glycopyrrol-Formoterol (BREZTRI AEROSPHERE) 160-9-4.8 MCG/ACT AERO Inhale 2 puffs into the lungs in the morning and at bedtime. (Patient taking differently: Inhale 2 puffs into the lungs 2 (two) times daily as needed (Shortness of breath/Wheezing).) 10.7 g 5   busPIRone (BUSPAR) 10 MG tablet TAKE 2 TABLETS BY MOUTH 3 TIMES DAILY. 540 tablet 1   diclofenac sodium (VOLTAREN) 1 % GEL Apply 2 g topically 4 (four) times daily. (Patient taking differently: Apply 2 g topically 4 (four) times daily as needed (pain).)  300 g 1   gabapentin (NEURONTIN) 400 MG capsule Take 1 capsule (400 mg total) by mouth 3 (three) times daily. (Patient taking differently: Take 400 mg by mouth daily.) 90 capsule 2   hydrOXYzine (ATARAX) 10 MG tablet Take 10 mg by mouth 3 (three) times daily as needed for itching.     levalbuterol (XOPENEX HFA) 45 MCG/ACT inhaler Inhale 2 puffs into the lungs every 6 (six) hours as needed for wheezing or shortness of breath. 1 each 12   Melatonin 10 MG CAPS Take 10 mg by mouth at bedtime.     metoprolol succinate (TOPROL-XL) 50 MG 24 hr tablet Take 1 tablet (50 mg total) by mouth daily. Take with or immediately following a meal. 90 tablet 3   pantoprazole (PROTONIX) 40 MG tablet Take 1 tablet (40 mg total) by mouth daily. 90 tablet 1   polycarbophil  (FIBERCON) 625 MG tablet Take 625 mg by mouth daily.     ramelteon (ROZEREM) 8 MG tablet TAKE 1 TABLET (8 MG TOTAL) BY MOUTH AT BEDTIME AS NEEDED FOR SLEEP. TAKE 30 MINUTES BEFORE BEDTIME. (Patient taking differently: Take 8 mg by mouth at bedtime. Take 30 minutes before bedtime.) 90 tablet 1   traZODone (DESYREL) 100 MG tablet Take 1 tablet (100 mg total) by mouth at bedtime. (Patient taking differently: Take 150 mg by mouth at bedtime.) 90 tablet 3   escitalopram (LEXAPRO) 10 MG tablet TAKE 2 TABLETS BY MOUTH EVERY DAY 60 tablet 0   Magnesium 400 MG TABS Take 400 mg by mouth daily. (Patient not taking: Reported on 02/16/2023) 90 tablet 3    No results found for this or any previous visit (from the past 48 hour(s)). No results found.  Review of Systems  All other systems reviewed and are negative.   Blood pressure (!) 118/59, pulse 62, temperature 98 F (36.7 C), temperature source Oral, resp. rate 17, height 5\' 4"  (1.626 m), weight 79.4 kg, last menstrual period 12/13/2022, SpO2 98%. Physical Exam  Patient is alert, oriented, no adenopathy, well-dressed, normal affect, normal respiratory effort. Patient has a palpable dorsalis pedis pulse.  There is a soft tissue mass in the second webspace with splaying of the second and third toes.  There is no redness or cellulitis.  Review of the MRI scan does show a soft tissue mass that is consistent with a Morton's neuroma. Assessment/Plan 1. Morton's neuroma of right foot       Plan: Recommended proceeding with an excision of the neuroma mass second webspace right foot.  Will send this to pathology for further evaluation.  Plan for outpatient surgery.  Nadara Mustard, MD 02/23/2023, 6:33 AM

## 2023-02-23 NOTE — Anesthesia Procedure Notes (Signed)
Procedure Name: LMA Insertion Date/Time: 02/23/2023 8:08 AM  Performed by: Allyn Kenner, CRNAPre-anesthesia Checklist: Patient identified, Emergency Drugs available, Suction available and Patient being monitored Patient Re-evaluated:Patient Re-evaluated prior to induction Oxygen Delivery Method: Circle System Utilized Preoxygenation: Pre-oxygenation with 100% oxygen Induction Type: IV induction Ventilation: Mask ventilation without difficulty LMA: LMA inserted LMA Size: 4.0 Number of attempts: 1 Airway Equipment and Method: Bite block Placement Confirmation: positive ETCO2 Tube secured with: Tape Dental Injury: Teeth and Oropharynx as per pre-operative assessment

## 2023-02-23 NOTE — Op Note (Signed)
02/23/2023  8:36 AM  PATIENT:  Kimberly Terrell    PRE-OPERATIVE DIAGNOSIS:  Morton's Neuroma Second Web Space Right Foot  POST-OPERATIVE DIAGNOSIS:  Same  PROCEDURE:  RIGHT FOOT EXCISION MASS 2ND WEB SPACE  SURGEON:  Nadara Mustard, MD  PHYSICIAN ASSISTANT:None ANESTHESIA:   General  PREOPERATIVE INDICATIONS:  Kimberly Terrell is a  47 y.o. female with a diagnosis of Morton's Neuroma Second Web Space Right Foot who failed conservative measures and elected for surgical management.    The risks benefits and alternatives were discussed with the patient preoperatively including but not limited to the risks of infection, bleeding, nerve injury, cardiopulmonary complications, the need for revision surgery, among others, and the patient was willing to proceed.  OPERATIVE IMPLANTS:   * No implants in log *  @ENCIMAGES @  OPERATIVE FINDINGS: Neuroma excised.  Tissue was consistent with a benign neuroma.  Tissue not sent for pathology.  OPERATIVE PROCEDURE: Patient brought the operating room and underwent a general anesthetic.  After adequate levels anesthesia obtained patient's right lower extremity was prepped with DuraPrep draped into a sterile field a timeout was called.  A dorsal incision was made over the second webspace.  Blunt dissection was carried down there was a large bulbous neuroma.  This was grasped with an Allis clamp the nerve was pulled cut proximally and distally and allowed to retract.  The wound was irrigated normal saline electrocautery was used hemostasis.  The incision was closed using 2-0 nylon.  A sterile dressing was applied patient was taken the PACU in stable condition.   DISCHARGE PLANNING:  Antibiotic duration: Preoperative antibiotics   Weightbearing: Touchdown weightbearing on the right.  Pain medication: Prescription sent for Percocet  Dressing care/ Wound VAC: Dry dressing change in 5 days  Ambulatory devices: Crutches  Discharge to:  Home.  Follow-up: In the office 1 week post operative.

## 2023-02-23 NOTE — Progress Notes (Signed)
Orthopedic Tech Progress Note Patient Details:  Kimberly Terrell 28-Aug-1975 [id number] Applied post op shoe and did crutch training with pt  Ortho Devices Type of Ortho Device: Postop shoe/boot, Crutches Ortho Device/Splint Location: LRE Ortho Device/Splint Interventions: Ordered, Application, Adjustment   Post Interventions Patient Tolerated: Well Instructions Provided: Adjustment of device, Care of device  Diannia Ruder 02/23/2023, 10:19 AM

## 2023-02-23 NOTE — Transfer of Care (Signed)
Immediate Anesthesia Transfer of Care Note  Patient: Kimberly Terrell. Crail  Procedure(s) Performed: RIGHT FOOT EXCISION MASS 2ND WEB SPACE (Right: Foot)  Patient Location: PACU  Anesthesia Type:General  Level of Consciousness: awake, alert , and oriented  Airway & Oxygen Therapy: Patient Spontanous Breathing and Patient connected to face mask oxygen  Post-op Assessment: Report given to RN and Post -op Vital signs reviewed and stable  Post vital signs: Reviewed and stable  Last Vitals:  Vitals Value Taken Time  BP 129/77 02/23/23 0845  Temp    Pulse 73 02/23/23 0847  Resp 14 02/23/23 0847  SpO2 98 % 02/23/23 0847  Vitals shown include unfiled device data.  Last Pain:  Vitals:   02/23/23 0649  TempSrc:   PainSc: 3          Complications: No notable events documented.

## 2023-02-25 ENCOUNTER — Encounter (HOSPITAL_COMMUNITY): Payer: Self-pay | Admitting: Orthopedic Surgery

## 2023-02-25 NOTE — Anesthesia Postprocedure Evaluation (Signed)
Anesthesia Post Note  Patient: Kimberly Terrell  Procedure(s) Performed: RIGHT FOOT EXCISION MASS 2ND WEB SPACE (Right: Foot)     Patient location during evaluation: PACU Anesthesia Type: General Level of consciousness: awake and sedated Pain management: pain level controlled Vital Signs Assessment: post-procedure vital signs reviewed and stable Respiratory status: spontaneous breathing Cardiovascular status: stable Postop Assessment: no apparent nausea or vomiting Anesthetic complications: no  There were no known notable events for this encounter.  Last Vitals:  Vitals:   02/23/23 0930 02/23/23 0945  BP: 125/67 (!) 112/51  Pulse: 69 65  Resp: 10 19  Temp:  (!) 36.2 C  SpO2: 97% 95%    Last Pain:  Vitals:   02/23/23 0930  TempSrc:   PainSc: 10-Worst pain ever                 Caren Macadam

## 2023-02-28 ENCOUNTER — Encounter (HOSPITAL_COMMUNITY)

## 2023-03-07 ENCOUNTER — Ambulatory Visit (INDEPENDENT_AMBULATORY_CARE_PROVIDER_SITE_OTHER): Admitting: Licensed Clinical Social Worker

## 2023-03-07 DIAGNOSIS — F418 Other specified anxiety disorders: Secondary | ICD-10-CM

## 2023-03-07 NOTE — BH Specialist Note (Signed)
Integrated Behavioral Health via Telemedicine Visit  03/07/2023 Akanksha Zelasko. Viscarra [id number]  Number of Integrated Behavioral Health Clinician visits: Additional Visit  Session Start time: 1330   Session End time: 1430  Total time in minutes: 60   Referring Provider: PCp Patient/Family location: home [facility] Provider location: Office All persons participating in visit: [facility] and Patient Types of Service: Individual psychotherapy  I connected with Kiara R. Pacifico via  [employer]  and verified that I am speaking with the correct person using two identifiers. Discussed confidentiality: Yes   I discussed the limitations of telemedicine and the availability of in person appointments.  Discussed there is a possibility of technology failure and discussed alternative modes of communication if that failure occurs.  I discussed that engaging in this telemedicine visit, they consent to the provision of behavioral healthcare and the services will be billed under their insurance.  Patient and/or legal guardian expressed understanding and consented to Telemedicine visit: Yes   Presenting Concerns: Patient and/or family reports the following symptoms/concerns: LCSW-A, [facility] conducted a telehealth session today with the patient, who is experiencing significant distress related to a recent diagnosis of Morton's neuroma in the foot. The patient reported undergoing surgery to remove the affected nerves, describing the postoperative pain as "super painful," which has been severe enough to disrupt sleep. The patient expressed hope that the pain will improve over time, stating, "eventually since surgery it will get better and less pain." This aligns with typical recovery expectations for Morton's neuroma, where surgical intervention often results in an 80-95% success rate for pain relief, although some patients may experience permanent numbness in the affected toes. During the session,  the discussion also turned to personal matters, specifically the upcoming celebration of the granddaughter's birthday. The patient noted that the child's father would not be attending, which triggered memories of disappointment from her own childhood experiences. LCSW-A facilitated a processing of these emotions, helping the patient articulate her feelings surrounding familial relationships and past disappointments. This therapeutic approach is crucial as it allows the patient to explore unresolved feelings and their impact on her current emotional state. Additionally, the patient shared her feelings about the children's father not attending Thanksgiving, further highlighting ongoing familial tensions. The session provided a space for the patient to reflect on these events and their emotional implications, reinforcing the importance of addressing both physical and emotional health in therapeutic settings. Overall, this session emphasized the interconnectedness of physical pain and emotional well-being, a key consideration in effective mental health treatment.   Patient and/or Family's Strengths/Protective Factors: Social connections  Goals Addressed: Patient will:  Reduce symptoms of: stress   Increase knowledge and/or ability of: coping skills   Demonstrate ability to: Increase healthy adjustment to current life circumstances  Progress towards Goals: Ongoing  Interventions: Interventions utilized:  CBT Cognitive Behavioral Therapy Standardized Assessments completed: Not Needed  Patient and/or Family Response: Patient agrees to continued treatment  Assessment: Patient currently experiencing stress.   Patient may benefit from ongoing services.  Plan: Follow up with behavioral health clinician on : With 60 days   I discussed the assessment and treatment plan with the patient and/or parent/guardian. They were provided an opportunity to ask questions and all were answered. They agreed with  the plan and demonstrated an understanding of the instructions.   They were advised to call back or seek an in-person evaluation if the symptoms worsen or if the condition fails to improve as anticipated.  Christen Butter, MSW, LCSW-A [facility] [employer]  [facility] Clinician [facility] Direct Dial:[phone]  Fax [phone] Main Office Phone: [phone] [address] [place] [place], [place] [address] Website: [facility]  [place], [place]  [facility]

## 2023-03-08 ENCOUNTER — Ambulatory Visit (INDEPENDENT_AMBULATORY_CARE_PROVIDER_SITE_OTHER): Admitting: Family

## 2023-03-08 ENCOUNTER — Encounter: Payer: Self-pay | Admitting: Family

## 2023-03-08 DIAGNOSIS — G5761 Lesion of plantar nerve, right lower limb: Secondary | ICD-10-CM

## 2023-03-08 NOTE — Progress Notes (Signed)
Post-Op Visit Note   Patient: Kimberly Terrell. Bails           Date of Birth: [birth year]           MRN: [id number] Visit Date: 03/08/2023 PCP: [name], MD  Chief Complaint:  Chief Complaint  Patient presents with   Right Foot - Routine Post Op    02/23/2023 right foot morton's neuroma excision 2nd web space     HPI:  HPI The patient is a 47 year old woman who is seen status post right foot Morton's neuroma excision in her second webspace.  Sutures were removed today.  She has been doing her full activities full weightbearing  Concerned about some tingling and shooting pain in the second and third toes  Ortho Exam On examination right foot the incision is well-healed there is mild edema no erythema full range of motion distally neurovascularly intact.  Visit Diagnoses: No diagnosis found.  Plan: Continue daily wound care may advance weightbearing as tolerated.  Discussed elevation compression for swelling.  Reassurance provided.    Follow-Up Instructions: No follow-ups on file.   Imaging: No results found.  Orders:  No orders of the defined types were placed in this encounter.  No orders of the defined types were placed in this encounter.    PMFS History: Patient Active Problem List   Diagnosis Date Noted   Productive cough 02/03/2023   Cervical cancer screening 01/28/2023   Right knee pain 01/28/2023   History of iron deficiency 12/24/2022   PVC (premature ventricular contraction) 11/17/2022   Leg cramping 11/17/2022   Colon polyps 08/09/2022   Health care maintenance 04/06/2022   Hemorrhoids 04/06/2022   Insomnia 03/09/2022   Pruritic dermatitis 02/04/2022   Heart failure with recovered ejection fraction (HFrecEF) (HCC) 01/04/2022   Perforated viscus 12/04/2021   Gastric ulcer 12/04/2021   Morton's neuroma of second interspace of right foot 09/23/2021   Bunion of left foot 09/23/2021   Shoulder pain, bilateral 02/27/2020   Asthma 10/30/2019    Chronic hip pain 10/30/2019   TOA (tubo-ovarian abscess) 07/04/2019   Gastroesophageal reflux disease without esophagitis 05/04/2019   H. pylori infection 03/28/2019   Allergic rhinitis 07/25/2017   Depression with anxiety 04/04/2017   Migraine 04/04/2017   DUB (dysfunctional uterine bleeding) 04/04/2017   Past Medical History:  Diagnosis Date   Anemia    Anxiety    Arthritis    Asthma    Bursitis    Right Hip and Right Knee   Depression    Dysrhythmia    H. pylori infection    s/p triple therapy x 2   Migraines     Family History  Problem Relation Age of Onset   Depression Mother    Depression Daughter    Colon cancer Neg Hx    Esophageal cancer Neg Hx    Gastric cancer Neg Hx    Inflammatory bowel disease Neg Hx    Liver disease Neg Hx    Pancreatic cancer Neg Hx    Rectal cancer Neg Hx    Stomach cancer Neg Hx     Past Surgical History:  Procedure Laterality Date   AUGMENTATION MAMMAPLASTY Bilateral    COSMETIC SURGERY  2008   breast implants    EXCISION MORTON'S NEUROMA Right 02/23/2023   Procedure: RIGHT FOOT EXCISION MASS 2ND WEB SPACE;  Surgeon: Nadara Mustard, MD;  Location: [facility] OR;  Service: Orthopedics;  Laterality: Right;   gastric ulcer surgery     gastric  ulcer perforation surgery   LAPAROTOMY N/A 12/04/2021   Procedure: EXPLORATORY LAPAROTOMY;  Surgeon: Harriette Bouillon, MD;  Location: [facility] OR;  Service: General;  Laterality: N/A;   Social History   Occupational History   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, advertising  Tobacco Use   Smoking status: Former    Current packs/day: 0.00    Types: Cigarettes    Quit date: 12/03/2021    Years since quitting: 1.2   Smokeless tobacco: Never   Tobacco comments:    Trying   Vaping Use   Vaping status: Never Used  Substance and Sexual Activity   Alcohol use: No   Drug use: No   Sexual activity: Yes    Birth control/protection: I.U.D., Surgical    Comment: husband has had a vasectomy

## 2023-03-11 ENCOUNTER — Encounter (HOSPITAL_COMMUNITY)

## 2023-03-15 ENCOUNTER — Ambulatory Visit (INDEPENDENT_AMBULATORY_CARE_PROVIDER_SITE_OTHER): Admitting: Student

## 2023-03-15 VITALS — BP 110/56 | HR 65 | Temp 97.5°F | Ht 64.0 in | Wt 182.0 lb

## 2023-03-15 DIAGNOSIS — F418 Other specified anxiety disorders: Secondary | ICD-10-CM

## 2023-03-15 DIAGNOSIS — G4700 Insomnia, unspecified: Secondary | ICD-10-CM | POA: Diagnosis not present

## 2023-03-15 DIAGNOSIS — Z8639 Personal history of other endocrine, nutritional and metabolic disease: Secondary | ICD-10-CM | POA: Diagnosis not present

## 2023-03-15 DIAGNOSIS — G5761 Lesion of plantar nerve, right lower limb: Secondary | ICD-10-CM | POA: Diagnosis not present

## 2023-03-15 DIAGNOSIS — F411 Generalized anxiety disorder: Secondary | ICD-10-CM

## 2023-03-15 MED ORDER — BUSPIRONE HCL 15 MG PO TABS: 15.0000 mg | ORAL_TABLET | Freq: Three times a day (TID) | ORAL | 2 refills | Status: DC

## 2023-03-15 MED ORDER — TRAZODONE HCL 100 MG PO TABS
200.0000 mg | ORAL_TABLET | Freq: Every day | ORAL | 2 refills | Status: DC
Start: 2023-03-15 — End: 2023-04-11

## 2023-03-15 MED ORDER — SENNOSIDES-DOCUSATE SODIUM 8.6-50 MG PO TABS: 1.0000 | ORAL_TABLET | Freq: Every evening | ORAL | Status: AC | PRN

## 2023-03-15 NOTE — Assessment & Plan Note (Addendum)
Prescribed gabapentin 400 mg which she takes as needed. Recently underwent surgical removal on 11/27 which she reports is still painful, but improving. She has had additional skin peeling on that foot that she reports has been coming and going for years. Suspect wrapping post-surgery as immediate cause. Recommended continuing moisturizing ointments and suggested trial of cleanser containing salicylic acid as tolerated.

## 2023-03-15 NOTE — Assessment & Plan Note (Addendum)
GAD-7 and PHQ-9 continuing to improve since last visit. She is currently prescribed Lexapro 20 mg daily and Buspar 10 mg TID, which she reports helps her. She and her husband report that increasing the medication really improved her mood for about 2 weeks, but since then, she has been experiencing days where she is irritable and short with him. She asks for something to improve her mood swings. She is also seeing IBH, which she reports helps. She does not use hyroxyzine anymore.  - See insomnia for sleep management - Increase Buspar to 15 mg TID if no sleep improvement after 1 week - Continue Lexapro 20 mg daily - Continue IBH

## 2023-03-15 NOTE — Progress Notes (Addendum)
The care of the patient was discussed with Dr. Lafonda Mosses and the assessment and plan was formulated with their assistance.  Please see their note for official documentation of the patient encounter.   Subjective:   Patient ID: Kimberly Terrell female   DOB: January 09, 1976 47 y.o.   MRN: [id number]  HPI: Kimberly Terrell. Smallwood is a 47 y.o. female presenting for insomnia and anxiety medication management. Accompanied today by husband and two grandchildren.  Past Medical History:  Diagnosis Date   Anemia    Anxiety    Arthritis    Asthma    Bursitis    Right Hip and Right Knee   Depression    Dysrhythmia    H. pylori infection    s/p triple therapy x 2   Migraines    Current Outpatient Medications  Medication Sig Dispense Refill   senna-docusate (SENOKOT-S) 8.6-50 MG tablet Take 1 tablet by mouth at bedtime as needed for mild constipation.     acetaminophen (TYLENOL) 500 MG tablet Take 2 tablets (1,000 mg total) by mouth every 6 (six) hours as needed. 30 tablet 0   Acetaminophen-guaiFENesin (MUCINEX COLD & FLU) 325-200 MG CAPS Take 2 tablets by mouth daily as needed (cold symptoms).     Budeson-Glycopyrrol-Formoterol (BREZTRI AEROSPHERE) 160-9-4.8 MCG/ACT AERO Inhale 2 puffs into the lungs in the morning and at bedtime. (Patient taking differently: Inhale 2 puffs into the lungs 2 (two) times daily as needed (Shortness of breath/Wheezing).) 10.7 g 5   busPIRone (BUSPAR) 15 MG tablet Take 1 tablet (15 mg total) by mouth 3 (three) times daily. 90 tablet 2   diclofenac sodium (VOLTAREN) 1 % GEL Apply 2 g topically 4 (four) times daily. (Patient taking differently: Apply 2 g topically 4 (four) times daily as needed (pain).) 300 g 1   escitalopram (LEXAPRO) 10 MG tablet TAKE 2 TABLETS BY MOUTH EVERY DAY 60 tablet 0   gabapentin (NEURONTIN) 400 MG capsule Take 1 capsule (400 mg total) by mouth 3 (three) times daily. (Patient taking differently: Take 400 mg by mouth daily.) 90 capsule 2    hydrOXYzine (ATARAX) 10 MG tablet Take 10 mg by mouth 3 (three) times daily as needed for itching.     levalbuterol (XOPENEX HFA) 45 MCG/ACT inhaler Inhale 2 puffs into the lungs every 6 (six) hours as needed for wheezing or shortness of breath. 1 each 12   Melatonin 10 MG CAPS Take 3-5 mg by mouth at bedtime.     metoprolol succinate (TOPROL-XL) 50 MG 24 hr tablet Take 1 tablet (50 mg total) by mouth daily. Take with or immediately following a meal. 90 tablet 3   pantoprazole (PROTONIX) 40 MG tablet Take 1 tablet (40 mg total) by mouth daily. 90 tablet 1   polycarbophil (FIBERCON) 625 MG tablet Take 625 mg by mouth daily.     ramelteon (ROZEREM) 8 MG tablet TAKE 1 TABLET (8 MG TOTAL) BY MOUTH AT BEDTIME AS NEEDED FOR SLEEP. TAKE 30 MINUTES BEFORE BEDTIME. (Patient taking differently: Take 8 mg by mouth at bedtime. Take 30 minutes before bedtime.) 90 tablet 1   traZODone (DESYREL) 100 MG tablet Take 2 tablets (200 mg total) by mouth at bedtime. 60 tablet 2   No current facility-administered medications for this visit.   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[employer], advertising  Tobacco Use   Smoking status: Former    Current packs/day: 0.00    Types: Cigarettes    Quit date: 12/03/2021    Years since quitting: 1.2   Smokeless tobacco: Never   Tobacco comments:    Trying   Vaping Use   Vaping status: Never Used  Substance and Sexual Activity   Alcohol use: No   Drug use: No   Sexual activity:  Yes    Birth control/protection: I.U.D., Surgical    Comment: husband has had a vasectomy  Other Topics Concern   Not on file  Social History Narrative   Right handed    Lives with [name]   Caffeine use: coffee-daily   Social Drivers of Health   Financial Resource Strain: Low Risk  (04/16/2021)   Received from [facility], [facility]   Overall Financial Resource Strain (CARDIA)    Difficulty of Paying Living Expenses: Not hard at all  Food Insecurity: No Food Insecurity (12/11/2021)   Hunger Vital Sign    Worried About Running Out of Food in the Last Year: Never true    Ran Out of Food in the Last Year: Never true  Transportation Needs: No Transportation Needs (12/11/2021)   PRAPARE - Administrator, Civil Service (Medical): No    Lack of Transportation (Non-Medical): No  Physical Activity: Sufficiently Active (10/31/2020)   Received from [facility], [facility]   Exercise Vital Sign    Days of Exercise per Week: 5 days    Minutes of Exercise per Session: 30 min  Stress: Stress Concern Present (10/31/2020)   Received from [facility], [facility] - Occupational Stress Questionnaire    Feeling of Stress : To some extent  Social Connections: Moderately Integrated (10/31/2020)   Received from [facility], [facility]   Social Connection and Isolation Panel [NHANES]    Frequency of Communication with Friends and Family: Once a week    Frequency of Social Gatherings with Friends and Family: Once a week    Attends Religious Services: 1 to 4 times per year    Active Member of [employer] or Organizations: No    Attends Engineer, structural: 1 to 4 times per year    Marital Status: Married   Review of Systems: Pertinent items noted in HPI and remainder of comprehensive ROS otherwise negative.  Objective:  Physical Exam: Vitals:   03/15/23 1408  BP: (!) 110/56  Pulse: 65  Temp: (!) 97.5 F  (36.4 C)  TempSrc: Oral  Weight: 182 lb (82.6 kg)  Height: 5\' 4"  (1.626 m)   BP (!) 110/56 (BP Location: Left Arm, Patient Position: Sitting, Cuff Size: Normal)   Pulse 65   Temp (!) 97.5 F (36.4 C) (Oral)   Ht 5\' 4"  (1.626 m)   Wt 182 lb (82.6 kg)   LMP 12/13/2022 (Approximate)  BMI 31.24 kg/m   General Appearance:    Alert, cooperative, no distress  Head:    Normocephalic, atraumatic  Neck:   Symmetrical, trachea midline  Lungs:     Clear to auscultation bilaterally, normal work of breathing on room air   Heart:    Regular rate and rhythm, S1 and S2 normal, no murmur, rub   or gallop  Extremities:   Extremities normal, atraumatic, no cyanosis or edema  Skin:   Skin color, texture, turgor normal, no rashes or lesions  Neurologic:   Grossly normal strength, sensation and reflexes throughout, normal gait   Assessment & Plan:  Insomnia Patient reports longstanding insomnia, where she takes 2 hours to fall asleep and awakens several times a night resulting in about 5 hours of sleep per night. She is currently prescribed melatonin 10 mg, ramelteon 8 mg, and trazodone 150, which she reports has helped her a little. She reports good sleep hygiene, consistent bedtime, and has not noticed anything in particular waking or keeping her up aside from sometimes racing thoughts. She does not endorse pain of any kind, cough, wheezing, or shortness of breath when she wakes up. She does not think her insomnia worsened when she started the Lexapro or Buspar. Given melatonin can be stimulating at high doses and she is still experiencing sleep difficulties, will decrease melatonin and increase trazodone. Was counseled to try this for a week, then increase her Buspar.  - Decrease melatonin to 3-5 mg 1-2 hours before bedtime - Increase trazodone to 200 mg nightly - Continue ramelteon 8 mg nightly  Depression with anxiety GAD-7 and PHQ-9 continuing to improve since last visit. She is currently prescribed  Lexapro 20 mg daily and Buspar 10 mg TID, which she reports helps her. She and her husband report that increasing the medication really improved her mood for about 2 weeks, but since then, she has been experiencing days where she is irritable and short with him. She asks for something to improve her mood swings. She is also seeing IBH, which she reports helps. She does not use hyroxyzine anymore.  - See insomnia for sleep management - Increase Buspar to 15 mg TID if no sleep improvement after 1 week - Continue Lexapro 20 mg daily - Continue IBH  History of iron deficiency Unclear etiology with last iron studies on 10/31 showing iron of 25, ferratin at 8, iron saturation of 6%, and hgb at 11.3. She was unable to schedule IV iron infusions because of her Morton neuroma surgery and plans to do so once her foot has healed enough to drive. Repeat iron studies afterwards.  Morton's neuroma of second interspace of right foot Prescribed gabapentin 400 mg which she takes as needed. Recently underwent surgical removal on 11/27 which she reports is still painful, but improving. She has had additional skin peeling on that foot that she reports has been coming and going for years. Suspect wrapping post-surgery as immediate cause. Recommended continuing moisturizing ointments and suggested trial of cleanser containing salicylic acid as tolerated.    Kimberly Terrell, MS3   Attestation for Student Documentation:  I personally was present and re-performed the history, physical exam and medical decision-making activities of this service and have verified that the service and findings are accurately documented in the student's note.  Rocky Morel, DO 03/16/2023, 10:12 AM

## 2023-03-15 NOTE — Patient Instructions (Addendum)
Thank you, Ms. Cai R. Mielke for allowing [place] to provide your care today.   For your sleep, decrease the melatonin to 3-5 mg nightly 2-3 hours before bedtime and increase the trazodone to 200 mg nightly. Keep the ramelteon 8 mg the same. Try this for about a week and see if that improves your sleep.   If your sleep doesn't get better, fill and take the increased dose of Buspar 15 mg three times per day.   For your feet, keep using a neutral lotion, especially right after washing them. You can also try a cleanser with salicylic acid to help break down the thicker skin.   When you're able to drive more easily, also schedule those iron infusions, as they may also help your low energy.    I have ordered the following medication/changed the following medications:    Start the following medications: Meds ordered this encounter  Medications   busPIRone (BUSPAR) 15 MG tablet    Sig: Take 1 tablet (15 mg total) by mouth 3 (three) times daily.    Dispense:  90 tablet    Refill:  2   traZODone (DESYREL) 100 MG tablet    Sig: Take 2 tablets (200 mg total) by mouth at bedtime.    Dispense:  60 tablet    Refill:  2   senna-docusate (SENOKOT-S) 8.6-50 MG tablet    Sig: Take 1 tablet by mouth at bedtime as needed for mild constipation.     Follow up: 2-3 months   We look forward to seeing you next time. Please call our clinic at [phone] if you have any questions or concerns. The best time to call is Monday-Friday from 9am-4pm, but there is someone available 24/7. If after hours or the weekend, call the main hospital number and ask for the Internal Medicine Resident On-Call. If you need medication refills, please notify your pharmacy one week in advance and they will send [place] a request.   Thank you for trusting me with your care. Wishing you the best!   Thea Alken, MS3

## 2023-03-15 NOTE — Assessment & Plan Note (Addendum)
Patient reports longstanding insomnia, where she takes 2 hours to fall asleep and awakens several times a night resulting in about 5 hours of sleep per night. She is currently prescribed melatonin 10 mg, ramelteon 8 mg, and trazodone 150, which she reports has helped her a little. She reports good sleep hygiene, consistent bedtime, and has not noticed anything in particular waking or keeping her up aside from sometimes racing thoughts. She does not endorse pain of any kind, cough, wheezing, or shortness of breath when she wakes up. She does not think her insomnia worsened when she started the Lexapro or Buspar. Given melatonin can be stimulating at high doses and she is still experiencing sleep difficulties, will decrease melatonin and increase trazodone. Was counseled to try this for a week, then increase her Buspar.  - Decrease melatonin to 3-5 mg 1-2 hours before bedtime - Increase trazodone to 200 mg nightly - Continue ramelteon 8 mg nightly

## 2023-03-15 NOTE — Progress Notes (Deleted)
47 year old female with history of HFimpEF, migraines, asthma, GERD, depression/anxiety, insomnia, and Morton's neuroma of the right foot s/p excision on 02/23/2023     11/17/2022    1:52 PM 03/05/2022   10:46 AM 02/04/2022   10:16 AM 02/04/2022   10:11 AM 10/22/2020   11:58 AM  Depression screen PHQ 2/9  Decreased Interest 2 0 1 1 1   Down, Depressed, Hopeless 2 0 1 1 1   PHQ - 2 Score 4 0 2 2 2   Altered sleeping 3 0 3 3 1   Tired, decreased energy 3 0 1 1 2   Change in appetite 2 0 0 0 0  Feeling bad or failure about yourself  2 0 0 0 0  Trouble concentrating 2 0 1 1 0  Moving slowly or fidgety/restless 0 0 1 1 0  Suicidal thoughts 0 0 0 0 0  PHQ-9 Score 16 0 8 8 5   Difficult doing work/chores Extremely dIfficult Somewhat difficult Very difficult Very difficult Somewhat difficult      11/17/2022    1:59 PM 10/22/2020   11:59 AM 07/08/2020   11:46 AM 01/31/2020   12:32 PM  GAD 7 : Generalized Anxiety Score  Nervous, Anxious, on Edge 2 2 1 1   Control/stop worrying 3 1 1 1   Worry too much - different things 3 1 1 1   Trouble relaxing 3 1 1 1   Restless 3 1 1  0  Easily annoyed or irritable 3 1 1 1   Afraid - awful might happen 0 0 0 0  Total GAD 7 Score 17 7 6 5   Anxiety Difficulty Extremely difficult Somewhat difficult Somewhat difficult Not difficult at all

## 2023-03-15 NOTE — Assessment & Plan Note (Addendum)
Unclear etiology with last iron studies on 10/31 showing iron of 25, ferratin at 8, iron saturation of 6%, and hgb at 11.3. She was unable to schedule IV iron infusions because of her Morton neuroma surgery and plans to do so once her foot has healed enough to drive. Repeat iron studies afterwards.

## 2023-03-17 ENCOUNTER — Other Ambulatory Visit: Payer: Self-pay | Admitting: Student

## 2023-03-17 DIAGNOSIS — F411 Generalized anxiety disorder: Secondary | ICD-10-CM

## 2023-03-17 NOTE — Progress Notes (Signed)
Internal Medicine Clinic Attending  Case discussed with the resident at the time of the visit.  We reviewed the resident's history and exam and pertinent patient test results.  I agree with the assessment, diagnosis, and plan of care documented in the resident's note.  

## 2023-03-18 ENCOUNTER — Encounter (HOSPITAL_COMMUNITY)

## 2023-03-19 ENCOUNTER — Other Ambulatory Visit: Payer: Self-pay | Admitting: Student

## 2023-03-19 DIAGNOSIS — F411 Generalized anxiety disorder: Secondary | ICD-10-CM

## 2023-03-20 ENCOUNTER — Other Ambulatory Visit: Payer: Self-pay | Admitting: Student

## 2023-03-20 DIAGNOSIS — L308 Other specified dermatitis: Secondary | ICD-10-CM

## 2023-03-28 ENCOUNTER — Telehealth: Payer: Self-pay | Admitting: *Deleted

## 2023-03-28 DIAGNOSIS — F418 Other specified anxiety disorders: Secondary | ICD-10-CM

## 2023-03-28 DIAGNOSIS — F411 Generalized anxiety disorder: Secondary | ICD-10-CM

## 2023-03-28 MED ORDER — BUSPIRONE HCL 10 MG PO TABS: 20.0000 mg | ORAL_TABLET | Freq: Three times a day (TID) | ORAL | 3 refills | Status: DC

## 2023-03-28 MED ORDER — CITALOPRAM HYDROBROMIDE 20 MG PO TABS: 20.0000 mg | ORAL_TABLET | Freq: Every day | ORAL | 2 refills | Status: DC

## 2023-03-28 NOTE — Telephone Encounter (Signed)
I returned call to patient.    She continues to have symptoms with anxiety, depression and insomnia.  Current medications include BuSpar 15 mg 3 times daily, escitalopram 20 mg, trazodone 200 mg, ramelteon 8 mg, and melatonin.  She has tried hydroxyzine in the past has not found this helpful.  Recent office visit she was changed from BuSpar 20 mg 3 times daily to 15 mg as staff thought she was taking BuSpar 10 mg 3 times daily.  She would like to switch escitalopram 20 mg which is maximum dose. She has been on this medication for some time and continues to have anxiety.  She asked about sleep recommendations as she continues to have difficulties with insomnia.  I do think she would benefit from behavioral therapy but she does not interested in this at this time.  P: Switch escitalopram to citalopram 20 mg.  Can titrate to 40 mg after 4 to 6 weeks.  Prescription updated for BuSpar 20 mg 3 times daily. This is maximum dosing.  Continue trazodone 200 mg, ramelteon, Atarax as needed

## 2023-03-28 NOTE — Telephone Encounter (Addendum)
Spoke with patient's husband who said that patient should be taking 90 mg per day per conversation during visit. Should be taking 2 -15 mg tablets 3 times a day.  Prescription sent in has her taking only 45 mg per day.

## 2023-04-04 ENCOUNTER — Other Ambulatory Visit (HOSPITAL_COMMUNITY): Payer: Self-pay

## 2023-04-06 ENCOUNTER — Telehealth: Payer: Self-pay | Admitting: Orthopedic Surgery

## 2023-04-06 NOTE — Telephone Encounter (Signed)
 Patient called and said she had surgery a month ago and still having a lot  of pain. CB#[phone]

## 2023-04-06 NOTE — Telephone Encounter (Signed)
 Can you please call pt and make an appt for next available date either Dr. Lajoyce Corners or Denny Peon please for eval. Thanks!

## 2023-04-10 ENCOUNTER — Other Ambulatory Visit: Payer: Self-pay | Admitting: Student

## 2023-04-10 DIAGNOSIS — G4700 Insomnia, unspecified: Secondary | ICD-10-CM

## 2023-04-11 NOTE — Telephone Encounter (Signed)
 Pharmacy requesting a 90 day supply  Medication sent to pharmacy.

## 2023-04-15 ENCOUNTER — Other Ambulatory Visit (INDEPENDENT_AMBULATORY_CARE_PROVIDER_SITE_OTHER): Payer: Self-pay

## 2023-04-15 ENCOUNTER — Ambulatory Visit (INDEPENDENT_AMBULATORY_CARE_PROVIDER_SITE_OTHER): Admitting: Family

## 2023-04-15 ENCOUNTER — Encounter: Payer: Self-pay | Admitting: Family

## 2023-04-15 DIAGNOSIS — M7541 Impingement syndrome of right shoulder: Secondary | ICD-10-CM

## 2023-04-15 DIAGNOSIS — G5761 Lesion of plantar nerve, right lower limb: Secondary | ICD-10-CM

## 2023-04-15 MED ORDER — OXYCODONE-ACETAMINOPHEN 5-325 MG PO TABS: 1.0000 | ORAL_TABLET | Freq: Three times a day (TID) | ORAL | 0 refills | Status: DC | PRN

## 2023-04-15 MED ORDER — GABAPENTIN 400 MG PO CAPS: 400.0000 mg | ORAL_CAPSULE | Freq: Four times a day (QID) | ORAL | 2 refills | Status: DC

## 2023-04-15 NOTE — Progress Notes (Unsigned)
Post-Op Visit Note   Patient: Kimberly Terrell. Marinas           Date of Birth: Apr 08, 1975           MRN: [id number] Visit Date: 04/15/2023 PCP: [name], MD  Chief Complaint:  Chief Complaint  Patient presents with   Right Foot - Follow-up    HPI:  HPI The patient is a 48 year old woman who is seen status post Morton neuroma excision on November 27.  She has been having some shooting stabbing pain in the area of her neuroma especially after prolonged walking prolonged weightbearing she has been 10 issues as her primary doctor.  Is concerned the neuroma "come back"  She is also concerned about some new pain she has had right anterior shoulder.  She was doing some lifting when she had onset of pain having difficulty with pain reaching above head and behind back.  She does not have any weakness no numbness no tingling no neck issues Right Shoulder Exam   Tenderness  The patient is experiencing tenderness in the biceps tendon.  Range of Motion  The patient has normal right shoulder ROM.  Tests  Impingement: positive Drop arm: negative  Other  Sensation: normal Pulse: present      Procedure Note  Patient: Kimberly Terrell. Nitzel             Date of Birth: 07-05-1975           MRN: [id number]             Visit Date: 04/15/2023  Procedures: Visit Diagnoses:  1. Impingement syndrome of right shoulder   2. Morton's neuroma of right foot     Large Joint Inj: R subacromial bursa on 04/19/2023 8:25 AM Indications: pain Details: 22 G 1.5 in needle Medications: 5 mL lidocaine 1 %; 40 mg methylPREDNISolone acetate 40 MG/ML Consent was given by the patient.         Visit Diagnoses:  1. Impingement syndrome of right shoulder     Plan: She will continue with stiff soled walking shoes.  Reassurance provided for the right foot.  Depo-Medrol injection right shoulder she will follow-up shoulder.  May consider versus physical therapy.    Follow-Up Instructions: Return  in about 2 weeks (around 04/29/2023).   Imaging: No results found.  Orders:  Orders Placed This Encounter  Procedures   XR Shoulder Right   No orders of the defined types were placed in this encounter.    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 pylori infection 03/28/2019   Allergic rhinitis 07/25/2017   Depression with anxiety 04/04/2017   Migraine 04/04/2017   DUB (dysfunctional uterine bleeding) 04/04/2017   Past Medical  History:  Diagnosis Date   Anemia    Anxiety    Arthritis    Asthma    Bursitis    Right Hip and Right Knee   Depression    Dysrhythmia    H. pylori infection    s/p triple therapy x 2   Migraines     Family History  Problem Relation Age of Onset   Depression Mother    Depression Daughter    Colon cancer Neg Hx    Esophageal cancer Neg Hx    Gastric cancer Neg Hx    Inflammatory bowel disease Neg Hx    Liver disease Neg Hx    Pancreatic cancer Neg Hx    Rectal cancer Neg Hx    Stomach cancer Neg Hx     Past Surgical History:  Procedure Laterality Date   AUGMENTATION MAMMAPLASTY Bilateral    COSMETIC SURGERY  2008   breast  implants    EXCISION MORTON'S NEUROMA Right 02/23/2023   Procedure: RIGHT FOOT EXCISION MASS 2ND WEB SPACE;  Surgeon: Nadara Mustard, MD;  Location: [facility] OR;  Service: Orthopedics;  Laterality: Right;   gastric ulcer surgery     gastric ulcer perforation surgery   LAPAROTOMY N/A 12/04/2021   Procedure: EXPLORATORY LAPAROTOMY;  Surgeon: Harriette Bouillon, MD;  Location: [facility] OR;  Service: General;  Laterality: N/A;   Social History   Occupational History   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, advertising  Tobacco Use   Smoking status: Former    Current packs/day: 0.00    Types: Cigarettes    Quit date: 12/03/2021    Years since quitting: 1.3   Smokeless tobacco: Never   Tobacco comments:    Trying   Vaping Use   Vaping status: Never Used  Substance and Sexual Activity   Alcohol use: No   Drug use: No   Sexual activity: Yes    Birth control/protection: I.U.D., Surgical    Comment: husband has had a vasectomy

## 2023-04-19 DIAGNOSIS — M7541 Impingement syndrome of right shoulder: Secondary | ICD-10-CM

## 2023-04-19 MED ORDER — METHYLPREDNISOLONE ACETATE 40 MG/ML IJ SUSP
40.0000 mg | INTRAMUSCULAR | Status: AC | PRN
  Administered 2023-04-19: 40 mg via INTRA_ARTICULAR

## 2023-04-19 MED ORDER — LIDOCAINE HCL 1 % IJ SOLN
5.0000 mL | INTRAMUSCULAR | Status: AC | PRN
  Administered 2023-04-19: 5 mL

## 2023-04-23 ENCOUNTER — Other Ambulatory Visit: Payer: Self-pay | Admitting: Internal Medicine

## 2023-04-23 DIAGNOSIS — F411 Generalized anxiety disorder: Secondary | ICD-10-CM

## 2023-04-23 DIAGNOSIS — F418 Other specified anxiety disorders: Secondary | ICD-10-CM

## 2023-04-29 ENCOUNTER — Ambulatory Visit (INDEPENDENT_AMBULATORY_CARE_PROVIDER_SITE_OTHER): Admitting: Internal Medicine

## 2023-04-29 VITALS — BP 102/56 | HR 72 | Temp 97.7°F | Ht 63.0 in | Wt 183.3 lb

## 2023-04-29 DIAGNOSIS — F418 Other specified anxiety disorders: Secondary | ICD-10-CM | POA: Diagnosis not present

## 2023-04-29 MED ORDER — CITALOPRAM HYDROBROMIDE 40 MG PO TABS: 40.0000 mg | ORAL_TABLET | Freq: Every day | ORAL | 2 refills | Status: DC

## 2023-04-29 NOTE — Progress Notes (Unsigned)
Subjective:  CC: anxiety  HPI:  [name] is a 48 y.o. female with a past medical history of asthma, depression with anxiety, HFrecEF, insomnia who presents today for anxiety.   Last seen in December for insomnia and anxiety.   Insomnia: Had longstanding insomnia.  Current medications include melatonin, trazodone, and ramelteon.  Depression: Previously she was prescribed Lexapro 20 mg and BuSpar 20 mg 3 times daily.  Lexapro switched to citalopram.  She is following up today to discuss if this improved her anxiety/depression.  Please see problem based assessment and plan for additional details.  Past Medical History:  Diagnosis Date   Anemia    Anxiety    Arthritis    Asthma    Bursitis    Right Hip and Right Knee   Depression    Dysrhythmia    H. pylori infection    s/p triple therapy x 2   Migraines     MEDICATIONS:  Senna Percocet Tylenol Atarax Voltaren gel Breast. Lev albuterol inhaler BuSpar 20 mg TID Citalopram 20 mg Trazodone 200 mg nightly Melatonin nightly Pantoprazole 40 mg daily Gabapentin for 100 mg 4 times daily Metoprolol succinate 50 mg daily Ramelteon 8 mg nightly as needed  Family History  Problem Relation Age of Onset   Depression Mother    Depression Daughter    Colon cancer Neg Hx    Esophageal cancer Neg Hx    Gastric cancer Neg Hx    Inflammatory bowel disease Neg Hx    Liver disease Neg Hx    Pancreatic cancer Neg Hx    Rectal cancer Neg Hx    Stomach cancer Neg Hx     Social History   Socioeconomic History   Marital status: Married    Spouse name: Teanna Elem   Number of children: 1   Years of education: Not on file   Highest education level: Not on file  Occupational History   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, advertising  Tobacco Use   Smoking status: Former    Current packs/day: 0.00    Types: Cigarettes    Quit date: 12/03/2021    Years since quitting: 1.4   Smokeless tobacco: Never   Tobacco comments:    Trying    Vaping Use   Vaping status: Never Used  Substance and Sexual Activity   Alcohol use: No   Drug use: No   Sexual activity: Yes    Birth control/protection: I.U.D., Surgical    Comment: husband has had a vasectomy  Other Topics Concern   Not on file  Social History Narrative   Right handed    Lives with [place]   Caffeine use: coffee-daily   Social Drivers of Health   Financial Resource Strain: Low Risk  (04/16/2021)   Received from [facility], [facility]   Overall Financial Resource Strain (CARDIA)    Difficulty of Paying Living Expenses: Not hard at all  Food Insecurity: No Food Insecurity (12/11/2021)   Hunger Vital Sign    Worried About Running Out of Food in the Last Year: Never true    Ran Out of Food in the Last Year: Never true  Transportation Needs: No Transportation Needs (12/11/2021)   PRAPARE - Administrator, Civil Service (Medical): No    Lack of Transportation (Non-Medical): No  Physical Activity: Sufficiently Active (10/31/2020)   Received from [facility], [facility]   Exercise Vital Sign    Days of Exercise  per Week: 5 days    Minutes of Exercise per Session: 30 min  Stress: Stress Concern Present (10/31/2020)   Received from [facility], [facility] - Occupational Stress Questionnaire    Feeling of Stress : To some extent  Social Connections: Moderately Integrated (10/31/2020)   Received from [facility], [facility]   Social Connection and Isolation Panel [NHANES]    Frequency of Communication with Friends and Family: Once a week    Frequency of Social Gatherings with Friends and Family: Once a week    Attends Religious Services: 1 to 4 times per year    Active Member of [employer] or Organizations: No    Attends Engineer, structural: 1 to 4 times per year    Marital Status: Married  Catering manager Violence: Not At Risk (12/11/2021)   Humiliation, Afraid, Rape,  and Kick questionnaire    Fear of Current or Ex-Partner: No    Emotionally Abused: No    Physically Abused: No    Sexually Abused: No    Review of Systems: ROS negative except for what is noted on the assessment and plan.  Objective:  There were no vitals filed for this visit.  Physical Exam: Constitutional: well-appearing *** sitting in ***, in no acute distress HENT: normocephalic atraumatic, mucous membranes moist Eyes: conjunctiva non-erythematous Neck: supple Cardiovascular: regular rate and rhythm, no m/r/g Pulmonary/Chest: normal work of breathing on room air, lungs clear to auscultation bilaterally Abdominal: soft, non-tender, non-distended MSK: normal bulk and tone Neurological: alert & oriented x 3, 5/5 strength in bilateral upper and lower extremities, normal gait Skin: warm and dry Psych: ***     Assessment & Plan:  No problem-specific Assessment & Plan notes found for this encounter.      03/15/2023    2:57 PM 11/17/2022    1:52 PM 03/05/2022   10:46 AM  Depression screen PHQ 2/9  Decreased Interest 2 2 0  Down, Depressed, Hopeless 1 2 0  PHQ - 2 Score 3 4 0  Altered sleeping 3 3 0  Tired, decreased energy 3 3 0  Change in appetite 2 2 0  Feeling bad or failure about yourself  1 2 0  Trouble concentrating 2 2 0  Moving slowly or fidgety/restless 0 0 0  Suicidal thoughts 0 0 0  PHQ-9 Score 14 16 0  Difficult doing work/chores Very difficult Extremely dIfficult Somewhat difficult      03/15/2023    2:58 PM 11/17/2022    1:59 PM 10/22/2020   11:59 AM 07/08/2020   11:46 AM  GAD 7 : Generalized Anxiety Score  Nervous, Anxious, on Edge 3 2 2 1   Control/stop worrying 2 3 1 1   Worry too much - different things 3 3 1 1   Trouble relaxing 1 3 1 1   Restless 2 3 1 1   Easily annoyed or irritable 2 3 1 1   Afraid - awful might happen 0 0 0 0  Total GAD 7 Score 13 17 7 6   Anxiety Difficulty Very difficult Extremely difficult Somewhat difficult Somewhat difficult      Patient discussed with Dr. {[id number]::"[id number]","Q. Hoffman","Mullen","Narendra","Machen","Vincent","Guilloud","Lau"}   [employer], D.O. [facility]  PGY-3 Pager: [phone]  Phone: [phone] Date 04/29/2023  Time 7:37 AM

## 2023-04-29 NOTE — Patient Instructions (Addendum)
Thank you, Ms.Vinia R. Monahan for allowing [place] to provide your care today.   Anxiety I am increased citalopram from 20 mg to 40 mg. Follow-up in 4 weeks. Have a good time on your cruise!!  Insomnia I double checked and you are on the maximum dose of ramelteon.  Sinus symptoms From UpToDate: Systemic glucocorticoids not indicated -- We suggest not using systemic gl?????rti??id? in the treatment of Acute ?acterial Respiratory Sinusitis. When given in addition to ?ntibi?ti?s, oral gl?????rti??ids may shorten the time to symptom resolution or improvement, although the benefits are small and, unlike topical gl?????rti??ids, systemic gl?????rti??i?? pose a potential risk for side effects that outweighs the clinical benefits.  I have ordered the following medication/changed the following medications:   Stop the following medications: Medications Discontinued During This Encounter  Medication Reason   citalopram (CELEXA) 20 MG tablet    Melatonin 10 MG CAPS      Start the following medications: Meds ordered this encounter  Medications   citalopram (CELEXA) 40 MG tablet    Sig: Take 1 tablet (40 mg total) by mouth daily.    Dispense:  30 tablet    Refill:  2     Follow up:  1 month    We look forward to seeing you next time. Please call our clinic at [phone] if you have any questions or concerns. The best time to call is Monday-Friday from 9am-4pm, but there is someone available 24/7. If after hours or the weekend, call the main hospital number and ask for the Internal Medicine Resident On-Call. If you need medication refills, please notify your pharmacy one week in advance and they will send [place] a request.   Thank you for trusting me with your care. Wishing you the best!   Rudene Christians, DO [facility]

## 2023-04-30 NOTE — Assessment & Plan Note (Addendum)
GAD7 from 17 to 13 with PHQ9 from 16 to 14. Denies SI and HI, Is following with Marena Chancy and finds this helpful. She feels that switching to citalopram has been helpful but would like to increase dose. P: Increase citalopram form 20 mg to 40 mg Continue buspar 20 mg TID Concomitant insomnia, I talked with her about risk of activating med making insomnia worse. We also reviewed signs of serotonin syndrome as she is on several medications that would put her at risk for this. F/u in 4 weeks

## 2023-05-02 ENCOUNTER — Ambulatory Visit: Admitting: Pulmonary Disease

## 2023-05-02 NOTE — Progress Notes (Signed)
 Internal Medicine Clinic Attending  Case discussed with the resident at the time of the visit.  We reviewed the resident's history and exam and pertinent patient test results.  I agree with the assessment, diagnosis, and plan of care documented in the resident's note.

## 2023-05-23 ENCOUNTER — Ambulatory Visit: Admitting: Pulmonary Disease

## 2023-05-23 ENCOUNTER — Encounter: Payer: Self-pay | Admitting: Pulmonary Disease

## 2023-05-23 VITALS — BP 118/68 | HR 65 | Ht 64.0 in | Wt 184.8 lb

## 2023-05-23 DIAGNOSIS — J019 Acute sinusitis, unspecified: Secondary | ICD-10-CM | POA: Diagnosis not present

## 2023-05-23 DIAGNOSIS — J4540 Moderate persistent asthma, uncomplicated: Secondary | ICD-10-CM | POA: Diagnosis not present

## 2023-05-23 MED ORDER — LEVALBUTEROL TARTRATE 45 MCG/ACT IN AERO: 2.0000 | INHALATION_SPRAY | Freq: Four times a day (QID) | RESPIRATORY_TRACT | 12 refills | Status: AC | PRN

## 2023-05-23 MED ORDER — LEVOFLOXACIN 750 MG PO TABS: 750.0000 mg | ORAL_TABLET | Freq: Every day | ORAL | 0 refills | Status: DC

## 2023-05-23 MED ORDER — BREZTRI AEROSPHERE 160-9-4.8 MCG/ACT IN AERO: 2.0000 | INHALATION_SPRAY | Freq: Two times a day (BID) | RESPIRATORY_TRACT | 11 refills | Status: AC

## 2023-05-23 MED ORDER — PREDNISONE 20 MG PO TABS: ORAL_TABLET | ORAL | 0 refills | Status: AC

## 2023-05-23 NOTE — Patient Instructions (Signed)
 Continue Breztri 2 puffs twice a day, rinse mouth out with water after use  Continue albuterol 2 puffs every 4-6 hours as needed  Both refilled  Levofloxacin 1 tablet once a day for 10 days, prednisone 40 mg for 5 days and 20 mg for 5 days for sinus congestion, infection  Return to clinic in 6 months or sooner as needed with Dr. Judeth Horn

## 2023-05-23 NOTE — Progress Notes (Signed)
 @Patient  ID: Kimberly Terrell, female    DOB: Oct 08, 1975, 48 y.o.   MRN: [id number]  Chief Complaint  Patient presents with   Follow-up    Pt complains of having breathing problems while going upstairs. Using breztri seems to help. Pt is thinking she has a sinus infection w/ drainage in throat. Yellow/ Green snot in nose.     Referring provider: [name]  HPI:   48 y.o. woman whom we are seeing for dyspnea on exertion, history of asthma.    Doing better overall.  Breztri with better control of symptoms.  No exacerbations in the interim.  Rare albuterol use.  Having some sinus congestion.  Thick discolored mucus.  For 2 weeks.  1 round of antibiotics at improvement.  They contain amoxicillin.  HPI initial visit: In general, breathing not much of an issue.  Then she had acute of stomach pain.  Had gastric perforation.  Status post repair.  Since then she has not quite been the same.  More dyspneic.  More short of breath.  Associated cough at times.  Albuterol helps some but gives her jitteriness, palpitations.  Does not like the way it makes her feel.  I cannot do any chest images in the interim since her hospitalization.  He has associated cough.  Sounds like some bouts of bronchitis.  Workup for dyspnea include cardiac workup, CT coronary 12/2021 reviewed and clear lungs, no significant coronary disease.  Underwent echocardiogram 01/2022 that did reveal some mitral valve regurgitation, otherwise reassuring.  While hospitalized, she did have a CT angio of the chest that demonstrated mild emphysema, otherwise clear lungs.  PMH: Asthma, GERD Family history: Mother with depression Social history: Former smoker, quit 2023, lives in [place] Surgical history: Breast augmentation, gastric ulcer perforation status post repair     Questionaires / Pulmonary Flowsheets:   ACT:      No data to display          MMRC:     No data to display          Epworth:       No data to display          Tests:   FENO:  No results found for: "NITRICOXIDE"  PFT:    Latest Ref Rng & Units 07/19/2022   11:07 AM  PFT Results  FVC-Pre L 2.97   FVC-Predicted Pre % 82   FVC-Post L 3.29   FVC-Predicted Post % 91   Pre FEV1/FVC % % 87   Post FEV1/FCV % % 85   FEV1-Pre L 2.58   FEV1-Predicted Pre % 89   FEV1-Post L 2.79   DLCO uncorrected ml/min/mmHg 19.29   DLCO UNC% % 90   DLCO corrected ml/min/mmHg 19.29   DLCO COR %Predicted % 90   DLVA Predicted % 93   TLC L 4.86   TLC % Predicted % 96   RV % Predicted % 94   Personally reviewed and interpreted as spirometry normal, no bronchodilator response but 320 cc and 10% increase in FEV1 noted, lung volumes within normal notes, DLCO within normal notes.  WALK:      No data to display          Imaging: Personally reviewed and as per EMR discussion this note No results found.  Lab Results: Personally reviewed, notably eosinophils elevated in the past CBC    Component Value Date/Time   WBC 10.5 01/27/2023 1150   WBC 10.0 08/12/2022 1845  RBC 4.49 01/27/2023 1150   RBC 4.26 08/12/2022 1845   HGB 11.3 01/27/2023 1150   HCT 36.2 01/27/2023 1150   PLT 237 01/27/2023 1150   MCV 81 01/27/2023 1150   MCH 25.2 (L) 01/27/2023 1150   MCH 24.4 (L) 08/12/2022 1845   MCHC 31.2 (L) 01/27/2023 1150   MCHC 31.0 08/12/2022 1845   RDW 15.7 (H) 01/27/2023 1150   LYMPHSABS 2.1 01/04/2022 1022   MONOABS 0.8 12/03/2021 2200   EOSABS 0.5 (H) 01/04/2022 1022   BASOSABS 0.1 01/04/2022 1022    BMET    Component Value Date/Time   NA 135 02/23/2023 0714   NA 138 11/17/2022 1427   K 4.1 02/23/2023 0714   CL 104 02/23/2023 0714   CO2 21 (L) 02/23/2023 0714   GLUCOSE 96 02/23/2023 0714   BUN 9 02/23/2023 0714   BUN 10 11/17/2022 1427   CREATININE 0.84 02/23/2023 0714   CALCIUM 9.1 02/23/2023 0714   GFRNONAA >60 02/23/2023 0714   GFRAA 117 04/09/2020 1113    BNP    Component Value Date/Time    BNP 152.1 (H) 01/27/2022 1138    ProBNP No results found for: "PROBNP"  Specialty Problems       Pulmonary Problems   Allergic rhinitis   Asthma   Productive cough    Allergies  Allergen Reactions   Imitrex [Sumatriptan] Other (See Comments)    "Gave me lockjaw"   Tape Other (See Comments)    The "plastic-like tape causes redness and irritation"   Aspirin Other (See Comments)    Caused chest pain    Immunization History  Administered Date(s) Administered   Moderna Sars-Covid-2 Vaccination 12/23/2019, 01/20/2020    Past Medical History:  Diagnosis Date   Anemia    Anxiety    Arthritis    Asthma    Bursitis    Right Hip and Right Knee   Depression    Dysrhythmia    H. pylori infection    s/p triple therapy x 2   Migraines     Tobacco History: Social History   Tobacco Use  Smoking Status Former   Current packs/day: 0.00   Types: Cigarettes   Quit date: 12/03/2021   Years since quitting: 1.4  Smokeless Tobacco Never  Tobacco Comments   Trying    Counseling given: Not Answered Tobacco comments: Trying    Continue to not smoke  Outpatient Encounter Medications as of 05/23/2023  Medication Sig   acetaminophen (TYLENOL) 500 MG tablet Take 2 tablets (1,000 mg total) by mouth every 6 (six) hours as needed.   Acetaminophen-guaiFENesin (MUCINEX COLD & FLU) 325-200 MG CAPS Take 2 tablets by mouth daily as needed (cold symptoms).   busPIRone (BUSPAR) 10 MG tablet Take 2 tablets (20 mg total) by mouth 3 (three) times daily.   citalopram (CELEXA) 40 MG tablet Take 1 tablet (40 mg total) by mouth daily.   diclofenac sodium (VOLTAREN) 1 % GEL Apply 2 g topically 4 (four) times daily. (Patient taking differently: Apply 2 g topically 4 (four) times daily as needed (pain).)   gabapentin (NEURONTIN) 400 MG capsule Take 1 capsule (400 mg total) by mouth 4 (four) times daily.   hydrOXYzine (ATARAX) 10 MG tablet TAKE 1 TABLET BY MOUTH THREE TIMES A DAY AS NEEDED    levofloxacin (LEVAQUIN) 750 MG tablet Take 1 tablet (750 mg total) by mouth daily.   oxyCODONE-acetaminophen (PERCOCET/ROXICET) 5-325 MG tablet Take 1 tablet by mouth every 8 (eight) hours as needed  for severe pain (pain score 7-10). (Patient taking differently: Take 1 tablet by mouth every 8 (eight) hours as needed for severe pain (pain score 7-10). As needed)   pantoprazole (PROTONIX) 40 MG tablet Take 1 tablet (40 mg total) by mouth daily.   polycarbophil (FIBERCON) 625 MG tablet Take 625 mg by mouth daily.   predniSONE (DELTASONE) 20 MG tablet Take 2 tablets (40 mg total) by mouth daily with breakfast for 5 days, THEN 1 tablet (20 mg total) daily with breakfast for 5 days.   ramelteon (ROZEREM) 8 MG tablet TAKE 1 TABLET (8 MG TOTAL) BY MOUTH AT BEDTIME AS NEEDED FOR SLEEP. TAKE 30 MINUTES BEFORE BEDTIME. (Patient taking differently: Take 8 mg by mouth at bedtime. Take 30 minutes before bedtime.)   senna-docusate (SENOKOT-S) 8.6-50 MG tablet Take 1 tablet by mouth at bedtime as needed for mild constipation.   traZODone (DESYREL) 100 MG tablet TAKE 2 TABLETS BY MOUTH AT BEDTIME.   [DISCONTINUED] Budeson-Glycopyrrol-Formoterol (BREZTRI AEROSPHERE) 160-9-4.8 MCG/ACT AERO Inhale 2 puffs into the lungs in the morning and at bedtime. (Patient taking differently: Inhale 2 puffs into the lungs 2 (two) times daily as needed (Shortness of breath/Wheezing).)   [DISCONTINUED] levalbuterol (XOPENEX HFA) 45 MCG/ACT inhaler Inhale 2 puffs into the lungs every 6 (six) hours as needed for wheezing or shortness of breath.   Budeson-Glycopyrrol-Formoterol (BREZTRI AEROSPHERE) 160-9-4.8 MCG/ACT AERO Inhale 2 puffs into the lungs in the morning and at bedtime.   levalbuterol (XOPENEX HFA) 45 MCG/ACT inhaler Inhale 2 puffs into the lungs every 6 (six) hours as needed for wheezing or shortness of breath.   metoprolol succinate (TOPROL-XL) 50 MG 24 hr tablet Take 1 tablet (50 mg total) by mouth daily. Take with or  immediately following a meal.   No facility-administered encounter medications on file as of 05/23/2023.     Review of Systems  Review of Systems  N/a Physical Exam  BP 118/68   Pulse 65   Ht 5\' 4"  (1.626 m)   Wt 184 lb 12.8 oz (83.8 kg)   SpO2 96%   BMI 31.72 kg/m   Wt Readings from Last 5 Encounters:  05/23/23 184 lb 12.8 oz (83.8 kg)  04/29/23 183 lb 4.8 oz (83.1 kg)  03/15/23 182 lb (82.6 kg)  02/23/23 175 lb (79.4 kg)  01/27/23 176 lb 11.2 oz (80.2 kg)    BMI Readings from Last 5 Encounters:  05/23/23 31.72 kg/m  04/29/23 32.47 kg/m  03/15/23 31.24 kg/m  02/23/23 30.04 kg/m  01/27/23 30.33 kg/m     Physical Exam General: Sitting in chair, no acute distress Eyes: EOMI, no icterus Neck: Supple, no JVP Pulmonary: Clear, normal work of breathing Cardiovascular: Warm, no edema Abdomen: Nondistended, bowel sounds present MSK: No synovitis, no joint effusion Neuro: Normal gait, no weakness Psych: Normal mood, full affect   Assessment & Plan:   Dyspnea on exertion: High suspicion for uncontrolled asthma.  Mild emphysema on prior chest imaging.  Possibly contributing.  She has quit smoking.  PFTs normal.  Chest x-ray clear.  Likely dyspnea related to asthma, improved in interim as below.  Asthma: Suspect poorly controlled and etiology of symptoms at time of referral.  Much improved with Breztri.  To continue.  Breztri and levalbuterol refilled today.  Sinusitis: 2+ weeks of congestion.  Discolored thick mucus.  No improvement with Augmentin.  Levofloxacin daily for 10 days, prednisone taper.  Return in about 6 months (around 11/20/2023) for f/u Dr. Judeth Horn.   Lesia Sago  Nona Gracey, MD 05/23/2023

## 2023-05-24 ENCOUNTER — Other Ambulatory Visit: Payer: Self-pay | Admitting: Student

## 2023-05-24 DIAGNOSIS — G4700 Insomnia, unspecified: Secondary | ICD-10-CM

## 2023-05-30 ENCOUNTER — Encounter: Admitting: Student

## 2023-05-30 ENCOUNTER — Ambulatory Visit (INDEPENDENT_AMBULATORY_CARE_PROVIDER_SITE_OTHER): Payer: Self-pay | Admitting: Licensed Clinical Social Worker

## 2023-05-30 ENCOUNTER — Telehealth (INDEPENDENT_AMBULATORY_CARE_PROVIDER_SITE_OTHER): Payer: Self-pay | Admitting: Licensed Clinical Social Worker

## 2023-05-30 ENCOUNTER — Telehealth: Payer: Self-pay | Admitting: Licensed Clinical Social Worker

## 2023-05-30 DIAGNOSIS — F418 Other specified anxiety disorders: Secondary | ICD-10-CM

## 2023-05-30 NOTE — BH Specialist Note (Signed)
 Integrated Behavioral Health via Telemedicine Visit  05/30/2023 Kimberly Drolet. Terrell [id number]  Number of Integrated Behavioral Health Clinician visits: Additional Visit  Session Start time: 1430   Session End time: 1530  Total time in minutes: 60   Referring Provider: PCP Patient/Family location: Home [facility] Provider location: Office All persons participating in visit: [facility] and Patient Types of Service: Individual psychotherapy and Telephone visit  I connected with Kimberly Terrell  via  Telephone o and verified that I am speaking with the correct person using two identifiers. Discussed confidentiality: Yes   I discussed the limitations of telemedicine and the availability of in person appointments.  Discussed there is a possibility of technology failure and discussed alternative modes of communication if that failure occurs.  I discussed that engaging in this telemedicine visit, they consent to the provision of behavioral healthcare and the services will be billed under their insurance.  Patient and/or legal guardian expressed understanding and consented to Telemedicine visit: Yes   Presenting Concerns: Patient and/or family reports the following symptoms/concerns:    The LCSW-A, [facility] conducted a telehealth session today with the patient, who reported experiencing ongoing family-related stress. The patient utilized the session to discuss communication challenges with her spouse and child. She expressed feeling overwhelmed by current difficulties but remains reliable and dependable, serving as the primary caregiver for her grandchildren, a role she finds joyful and fulfilling.  During the session, both the patient and [facility] engaged in self-reflection, and the [facility] recommended strategies to enhance communication skills. The [facility] acknowledged that the patient is effectively managing her multiple responsibilities and roles. Moving forward, the [facility] will suggest additional self-care activities  and encourage the patient to minimize allowing others' behaviors to significantly impact her emotional well-being.   Patient and/or Family's Strengths/Protective Factors: Social connections  Goals Addressed: Patient will:  Reduce symptoms of: depression   Progress towards Goals: Ongoing  Interventions: Interventions utilized:  CBT Cognitive Behavioral Therapy Standardized Assessments completed: Not Needed  Patient and/or Family Response: Patient agrees to ongoing services  Assessment: Patient currently experiencing Depression.   Patient may benefit from OPT.  Plan: Follow up with behavioral health clinician on : within the next 30 days  I discussed the assessment and treatment plan with the patient and/or parent/guardian. They were provided an opportunity to ask questions and all were answered. They agreed with the plan and demonstrated an understanding of the instructions.   They were advised to call back or seek an in-person evaluation if the symptoms worsen or if the condition fails to improve as anticipated.  Christen Butter, MSW, LCSW-A She/Her Behavioral Health Clinician [facility]  Internal Medicine Center Direct Dial:[phone]  Fax [phone] Main Office Phone: [phone] [address] [place] [place], [place] [address] Website: [facility] Internal Medicine [facility]  [place], [place]  [place]

## 2023-06-06 ENCOUNTER — Other Ambulatory Visit (INDEPENDENT_AMBULATORY_CARE_PROVIDER_SITE_OTHER)

## 2023-06-06 ENCOUNTER — Ambulatory Visit (INDEPENDENT_AMBULATORY_CARE_PROVIDER_SITE_OTHER): Admitting: Orthopedic Surgery

## 2023-06-06 ENCOUNTER — Encounter: Payer: Self-pay | Admitting: Orthopedic Surgery

## 2023-06-06 DIAGNOSIS — M7542 Impingement syndrome of left shoulder: Secondary | ICD-10-CM | POA: Diagnosis not present

## 2023-06-06 DIAGNOSIS — M79672 Pain in left foot: Secondary | ICD-10-CM | POA: Diagnosis not present

## 2023-06-06 MED ORDER — LIDOCAINE HCL 1 % IJ SOLN
5.0000 mL | INTRAMUSCULAR | Status: AC | PRN
  Administered 2023-06-06: 5 mL

## 2023-06-06 MED ORDER — METHYLPREDNISOLONE ACETATE 40 MG/ML IJ SUSP
40.0000 mg | INTRAMUSCULAR | Status: AC | PRN
  Administered 2023-06-06: 40 mg via INTRA_ARTICULAR

## 2023-06-06 MED ORDER — LIDOCAINE HCL 1 % IJ SOLN
1.0000 mL | INTRAMUSCULAR | Status: AC | PRN
  Administered 2023-06-06: 1 mL

## 2023-06-06 NOTE — Progress Notes (Signed)
 Office Visit Note   Patient: Kimberly Terrell. Corsi           Date of Birth: 1975-04-15           MRN: [id number] Visit Date: 06/06/2023              Requested by: [name], MD [address] [place],  [place] [address] PCP: [name], MD  Chief Complaint  Patient presents with   Left Shoulder - Pain      HPI: Patient is a 48 year old woman who is seen with acute left shoulder pain and left foot pain.  Patient states that her shoulder pain on the left was exacerbated while internally rotating her arm while in a car.  Patient states the left foot pain second webspace started up and feels similar to the neuroma pain she had on her right foot prior to the neuroma resection.  Assessment & Plan: Visit Diagnoses:  1. Pain in left foot   2. Impingement syndrome of left shoulder     Plan: Left subacromial space injected and the left second webspace injected.  Can continue subacromial injections as needed.  Discussed that she may need arthroscopic debridement.  Will follow the neuroma symptoms in the left second webspace.  Patient may require nerve resection similar to the right foot.  Follow-Up Instructions: Return if symptoms worsen or fail to improve.   Ortho Exam  Patient is alert, oriented, no adenopathy, well-dressed, normal affect, normal respiratory effort. Examination patient has pain with internal and external rotation of the left shoulder.  She is tender to palpation laterally and beneath the coracoid.  Examination of the left foot she has pain reproduced with palpation over the second webspace.  She has a good pulse.  Imaging: XR Foot Complete Left Result Date: 06/06/2023 Three-view radiographs of the left foot shows no bony abnormalities no fractures.  The metatarsal head cascade is within normal limits.  No images are attached to the encounter.  Labs: Lab Results  Component Value Date   HGBA1C 5.6 11/17/2022   REPTSTATUS 12/12/2021 FINAL 12/06/2021    CULT  12/06/2021    NO GROWTH 5 DAYS Performed at [facility], [address], [place], [place] [address]      Lab Results  Component Value Date   ALBUMIN 4.5 08/12/2022   ALBUMIN 4.6 08/06/2022   ALBUMIN 2.1 (L) 12/05/2021    Lab Results  Component Value Date   MG 1.9 12/15/2021   MG 1.8 12/12/2021   MG 1.9 12/09/2021   No results found for: "VD25OH"  No results found for: "PREALBUMIN"    Latest Ref Rng & Units 01/27/2023   11:50 AM 11/17/2022    2:27 PM 08/12/2022    6:45 PM  CBC EXTENDED  WBC 3.4 - 10.8 x10E3/uL 10.5  10.0  10.0   RBC 3.77 - 5.28 x10E6/uL 4.49  4.46  4.26   Hemoglobin 11.1 - 15.9 g/dL 65.7  84.6  96.2   HCT 34.0 - 46.6 % 36.2  35.1  33.6   Platelets 150 - 450 x10E3/uL 237  269  336      There is no height or weight on file to calculate BMI.  Orders:  Orders Placed This Encounter  Procedures   XR Foot Complete Left   No orders of the defined types were placed in this encounter.    Procedures: Large Joint Inj: L subacromial bursa on 06/06/2023 11:31 AM Indications: diagnostic evaluation and pain Details: 22  G 1.5 in needle, posterior approach  Arthrogram: No  Medications: 5 mL lidocaine 1 %; 40 mg methylPREDNISolone acetate 40 MG/ML Outcome: tolerated well, no immediate complications Procedure, treatment alternatives, risks and benefits explained, specific risks discussed. Consent was given by the patient. Immediately prior to procedure a time out was called to verify the correct patient, procedure, equipment, support staff and site/side marked as required. Patient was prepped and draped in the usual sterile fashion.    Small Joint Inj: L intertarsal on 06/06/2023 11:31 AM Indications: pain and diagnostic evaluation Details: 22 G needle, dorsal approach  Spinal Needle: No  Medications: 1 mL lidocaine 1 %; 40 mg methylPREDNISolone acetate 40 MG/ML Outcome: tolerated well, no immediate complications Procedure, treatment  alternatives, risks and benefits explained, specific risks discussed. Consent was given by the patient. Immediately prior to procedure a time out was called to verify the correct patient, procedure, equipment, support staff and site/side marked as required. Patient was prepped and draped in the usual sterile fashion.      Clinical Data: No additional findings.  ROS:  All other systems negative, except as noted in the HPI. Review of Systems  Objective: Vital Signs: There were no vitals taken for this visit.  Specialty Comments:  No specialty comments available.  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 pylori infection 03/28/2019   Allergic rhinitis 07/25/2017   Depression with anxiety 04/04/2017   Migraine 04/04/2017   DUB (dysfunctional uterine bleeding) 04/04/2017   Past Medical History:  Diagnosis Date   Anemia    Anxiety    Arthritis    Asthma    Bursitis    Right Hip and Right Knee   Depression    Dysrhythmia    H. pylori infection    s/p triple therapy x 2   Migraines     Family History  Problem Relation Age of Onset   Depression Mother     Depression Daughter    Colon cancer Neg Hx    Esophageal cancer Neg Hx    Gastric cancer Neg Hx    Inflammatory bowel disease Neg Hx    Liver disease Neg Hx    Pancreatic cancer Neg Hx    Rectal cancer Neg Hx    Stomach cancer Neg Hx     Past Surgical History:  Procedure Laterality Date   AUGMENTATION MAMMAPLASTY Bilateral    COSMETIC SURGERY  2008   breast implants    EXCISION MORTON'S NEUROMA Right 02/23/2023   Procedure: RIGHT FOOT EXCISION MASS 2ND WEB SPACE;  Surgeon: Nadara Mustard, MD;  Location: [facility] OR;  Service: Orthopedics;  Laterality: Right;   gastric ulcer surgery     gastric ulcer perforation surgery  LAPAROTOMY N/A 12/04/2021   Procedure: EXPLORATORY LAPAROTOMY;  Surgeon: Harriette Bouillon, MD;  Location: [facility] OR;  Service: General;  Laterality: N/A;   Social History   Occupational History   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, advertising  Tobacco Use   Smoking status: Former    Current packs/day: 0.00    Types: Cigarettes    Quit date: 12/03/2021    Years since quitting: 1.5   Smokeless tobacco: Never   Tobacco comments:    Trying   Vaping Use   Vaping status: Never Used  Substance and Sexual Activity   Alcohol use: No   Drug use: No   Sexual activity: Yes    Birth control/protection: I.U.D., Surgical    Comment: husband has had a vasectomy

## 2023-06-08 ENCOUNTER — Telehealth: Payer: Self-pay | Admitting: *Deleted

## 2023-06-08 ENCOUNTER — Other Ambulatory Visit: Payer: Self-pay | Admitting: Student

## 2023-06-08 DIAGNOSIS — Z1231 Encounter for screening mammogram for malignant neoplasm of breast: Secondary | ICD-10-CM

## 2023-06-08 NOTE — Telephone Encounter (Signed)
 Mammogram appointment mailed to patient/ November 14.2025 @ 11:30 am to arrive 11:15 am/ spoke with patient  she is aware of the 75.00 no show fee and to call to reschedule or cancel 24 hour prior to appointment. To contact the breast center at [phone].

## 2023-06-13 ENCOUNTER — Other Ambulatory Visit: Payer: Self-pay | Admitting: Gastroenterology

## 2023-06-13 DIAGNOSIS — K255 Chronic or unspecified gastric ulcer with perforation: Secondary | ICD-10-CM

## 2023-06-16 NOTE — BH Specialist Note (Signed)
 [facility] opened up encounter in error.

## 2023-06-16 NOTE — BH Specialist Note (Signed)
 [facility] opened encounter in error.   Christen Butter, MSW, LCSW-A She/Her Behavioral Health Clinician [facility]  Internal Medicine Center Direct Dial:[phone]  Fax [phone] Main Office Phone: [phone] [address] [place] [place], [place] [address] Website: [facility] Internal Medicine [facility]  [place], [place]  [place]

## 2023-06-27 ENCOUNTER — Encounter: Admitting: Student

## 2023-07-18 ENCOUNTER — Ambulatory Visit (INDEPENDENT_AMBULATORY_CARE_PROVIDER_SITE_OTHER): Payer: Self-pay | Admitting: Licensed Clinical Social Worker

## 2023-07-18 DIAGNOSIS — F418 Other specified anxiety disorders: Secondary | ICD-10-CM | POA: Diagnosis not present

## 2023-07-18 NOTE — BH Specialist Note (Addendum)
 Integrated Behavioral Health via Telemedicine Visit  07/18/2023 Kimberly Terrell. [place] [id number]  Number of Integrated Behavioral Health Clinician visits: Additional Visit  Session Start time: 1330   Session End time: 1400  Total time in minutes: 30  Referring Provider: PCP Patient/Family location: Home [facility] Provider location: Office All persons participating in visit: [facility] and Patient Types of Service: Individual psychotherapy and Telephone visit   I connected with Kimberly Terrell  via  Telephone and verified that I am speaking with the correct person using two identifiers. Discussed confidentiality: Yes    I discussed the limitations of telemedicine and the availability of in person appointments.  Discussed there is a possibility of technology failure and discussed alternative modes of communication if that failure occurs.   I discussed that engaging in this telemedicine visit, they consent to the provision of behavioral healthcare and the services will be billed under their insurance.   Patient and/or legal guardian expressed understanding and consented to Telemedicine visit: Yes    Presenting Concerns: Patient and/or family reports the following symptoms/concerns:  The session was conducted today via telehealth using a HIPAA-compliant platform. There was a minor delay in beginning the session due to temporary network connectivity issues. Once the connection was successfully established, the client was in good spirits and was cooperative throughout the session.  During the session, the client shared that she recently completed enrollment at [employer] and expressed enthusiasm about this new step toward her personal wellness goals. She discussed her desire to lose weight and return to a thinner version of herself, stating that she wants to "get back to her old self." The client was particularly excited about some of the gym's amenities, specifically mentioning the tanning beds and  massage options available to her as part of her membership. She appeared motivated and optimistic about incorporating regular physical activity into her routine.  The client also mentioned that her granddaughter will be starting school soon, which will open up more time during the day for her. She described the gym as a productive way to fill that time and engage in self-care. Her husband was reportedly supportive of this new endeavor, having paid for the gym membership and providing transportation to the facility. This support appears to be a positive influence and resource for the client.  The client stated she is currently doing well and did not identify any acute concerns during the session. She expressed awareness of the availability of services and agreed to contact [facility]) in the future should the need arise.  During the conversation, the client also brought up a bill she received related to [facility] services. She expressed some confusion about the nature of the charge. At this time, [facility] is unsure of the details regarding the bill. The client agreed to reach out to the billing department directly for clarification.  Overall, the client demonstrated insight into her needs, a proactive attitude toward wellness, and engagement in the therapeutic process. No follow-up was scheduled at this time, per the client's request, but she is aware that services remain available to her as needed.     Patient and/or Family's Strengths/Protective Factors: Social connections   Goals Addressed: Patient will:  Reduce symptoms of: depression and increase self care   Progress towards Goals: Resolved   Interventions: Interventions utilized:  Supportive counseling Standardized Assessments completed: Not Needed   Patient and/or Family Response: Patient agrees to ongoing services   Assessment: Patient is working on self care and has shown  improvement.   Plan: Patient agreed to contact  agency when she is available for therapy.   I discussed the assessment and treatment plan with the patient and/or parent/guardian. They were provided an opportunity to ask questions and all were answered. They agreed with the plan and demonstrated an understanding of the instructions.   They were advised to call back or seek an in-person evaluation if the symptoms worsen or if the condition fails to improve as anticipated.   Amie Bald, MSW, LCSW-A She/Her Behavioral Health Clinician [facility]  Internal Medicine Center Direct [phone]  Fax [phone] Main Office Phone: [phone] [address] [place], [place] [address] Website: [facility]  [place], [place]  Naomi

## 2023-08-04 ENCOUNTER — Ambulatory Visit (HOSPITAL_BASED_OUTPATIENT_CLINIC_OR_DEPARTMENT_OTHER): Admitting: Student

## 2023-08-04 ENCOUNTER — Ambulatory Visit (HOSPITAL_BASED_OUTPATIENT_CLINIC_OR_DEPARTMENT_OTHER)

## 2023-08-04 ENCOUNTER — Encounter (HOSPITAL_BASED_OUTPATIENT_CLINIC_OR_DEPARTMENT_OTHER): Payer: Self-pay | Admitting: Student

## 2023-08-04 DIAGNOSIS — G8929 Other chronic pain: Secondary | ICD-10-CM

## 2023-08-04 DIAGNOSIS — M545 Low back pain, unspecified: Secondary | ICD-10-CM

## 2023-08-04 DIAGNOSIS — M79645 Pain in left finger(s): Secondary | ICD-10-CM

## 2023-08-04 MED ORDER — METHYLPREDNISOLONE 4 MG PO TBPK: ORAL_TABLET | ORAL | 0 refills | Status: DC

## 2023-08-04 MED ORDER — METHOCARBAMOL 500 MG PO TABS: 500.0000 mg | ORAL_TABLET | Freq: Four times a day (QID) | ORAL | 0 refills | Status: AC

## 2023-08-04 NOTE — Progress Notes (Signed)
 Chief Complaint: Low back and left pinky pain     History of Present Illness:    Kimberly Terrell is a 48 y.o. female presenting today for evaluation of pain in her low back as well as her left pinky finger.  She has had bilateral low back pain chronically since last year, and was last seen on 11/25/2022 for this.  She did improve with a Medrol  Dosepak, however pain slowly returned.  Pain has been radiating up into the thoracic spine but denies any radiation into the lower extremities.  Also denies any numbness or tingling.  Pain in the left pinky finger began a couple weeks ago without any known injury.  Pain is located over the fifth PIP joint and she reports some mild swelling.  This has not significantly worsened or improved since the onset.  She has been taking Tylenol  as needed as she cannot take NSAIDs.  She is right-hand dominant.   Surgical History:   None  PMH/PSH/Family History/Social History/Meds/Allergies:    Past Medical History:  Diagnosis Date   Anemia    Anxiety    Arthritis    Asthma    Bursitis    Right Hip and Right Knee   Depression    Dysrhythmia    H. pylori infection    s/p triple therapy x 2   Migraines    Past Surgical History:  Procedure Laterality Date   AUGMENTATION MAMMAPLASTY Bilateral    COSMETIC SURGERY  2008   breast implants    EXCISION MORTON'S NEUROMA Right 02/23/2023   Procedure: RIGHT FOOT EXCISION MASS 2ND WEB SPACE;  Surgeon: Timothy Ford, MD;  Location: [facility] OR;  Service: Orthopedics;  Laterality: Right;   gastric ulcer surgery     gastric ulcer perforation surgery   LAPAROTOMY N/A 12/04/2021   Procedure: EXPLORATORY LAPAROTOMY;  Surgeon: Sim Dryer, MD;  Location: [facility] OR;  Service: General;  Laterality: N/A;   Social History   Socioeconomic History   Marital status: Married    Spouse name: Saylem Bargas   Number of children: 1   Years of education: Not on file   Highest education level:  Not on file  Occupational History   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[employer], advertising  Tobacco Use   Smoking status: Former    Current packs/day: 0.00    Types: Cigarettes    Quit date: 12/03/2021    Years since quitting: 1.6   Smokeless tobacco: Never   Tobacco comments:    Trying   Vaping Use   Vaping status: Never Used  Substance and Sexual Activity   Alcohol use: No   Drug use: No   Sexual activity: Yes    Birth control/protection: I.U.D., Surgical    Comment: husband has had a vasectomy  Other Topics Concern   Not on file  Social History Narrative   Right handed    Lives with Poway Jarecki   Caffeine use: coffee-daily   Social Drivers of Health   Financial Resource Strain: Low Risk  (04/16/2021)   Received from [facility], [facility]   Overall Financial Resource Strain (CARDIA)    Difficulty of Paying Living Expenses: Not hard at all  Food Insecurity: No Food Insecurity (04/29/2023)   Hunger Vital Sign    Worried About Running Out of Food in the  Last Year: Never true    Ran Out of Food in the Last Year: Never true  Transportation Needs: No Transportation Needs (04/29/2023)   PRAPARE - Administrator, Civil Service (Medical): No    Lack of Transportation (Non-Medical): No  Physical Activity: Sufficiently Active (10/31/2020)   Received from [facility], [facility]   Exercise Vital Sign    Days of Exercise per Week: 5 days    Minutes of Exercise per Session: 30 min  Stress: Stress Concern Present (10/31/2020)   Received from [facility], [facility] of Occupational Health - Occupational Stress Questionnaire    Feeling of Stress : To some extent  Social Connections: Moderately Integrated (10/31/2020)   Received from [facility], [facility]   Social Connection and Isolation Panel [NHANES]    Frequency of Communication with Friends and Family: Once a week    Frequency of Social Gatherings with Friends and Family: Once a week     Attends Religious Services: 1 to 4 times per year    Active Member of [employer] or Organizations: No    Attends Engineer, structural: 1 to 4 times per year    Marital Status: Married   Family History  Problem Relation Age of Onset   Depression Mother    Depression Daughter    Colon cancer Neg Hx    Esophageal cancer Neg Hx    Gastric cancer Neg Hx    Inflammatory bowel disease Neg Hx    Liver disease Neg Hx    Pancreatic cancer Neg Hx    Rectal cancer Neg Hx    Stomach cancer Neg Hx    Allergies  Allergen Reactions   Imitrex  [Sumatriptan ] Other (See Comments)    "Gave me lockjaw"   Tape Other (See Comments)    The "plastic-like tape causes redness and irritation"   Aspirin Other (See Comments)    Caused chest pain   Current Outpatient Medications  Medication Sig Dispense Refill   methocarbamol  (ROBAXIN ) 500 MG tablet Take 1 tablet (500 mg total) by mouth 4 (four) times daily for 10 days. 40 tablet 0   methylPREDNISolone  (MEDROL  DOSEPAK) 4 MG TBPK tablet Take per packet instructions 1 each 0   acetaminophen  (TYLENOL ) 500 MG tablet Take 2 tablets (1,000 mg total) by mouth every 6 (six) hours as needed. 30 tablet 0   Acetaminophen -guaiFENesin  (MUCINEX  COLD & FLU) 325-200 MG CAPS Take 2 tablets by mouth daily as needed (cold symptoms).     Budeson-Glycopyrrol-Formoterol (BREZTRI  AEROSPHERE) 160-9-4.8 MCG/ACT AERO Inhale 2 puffs into the lungs in the morning and at bedtime. 1 each 11   busPIRone  (BUSPAR ) 10 MG tablet Take 2 tablets (20 mg total) by mouth 3 (three) times daily. 180 tablet 3   citalopram  (CELEXA ) 40 MG tablet Take 1 tablet (40 mg total) by mouth daily. 30 tablet 2   diclofenac  sodium (VOLTAREN ) 1 % GEL Apply 2 g topically 4 (four) times daily. (Patient taking differently: Apply 2 g topically 4 (four) times daily as needed (pain).) 300 g 1   gabapentin  (NEURONTIN ) 400 MG capsule Take 1 capsule (400 mg total) by mouth 4 (four) times daily. 120 capsule 2    hydrOXYzine  (ATARAX ) 10 MG tablet TAKE 1 TABLET BY MOUTH THREE TIMES A DAY AS NEEDED 270 tablet 1   levalbuterol  (XOPENEX  HFA) 45 MCG/ACT inhaler Inhale 2 puffs into the lungs every 6 (six) hours as needed for  wheezing or shortness of breath. 1 each 12   levofloxacin  (LEVAQUIN ) 750 MG tablet Take 1 tablet (750 mg total) by mouth daily. 10 tablet 0   metoprolol  succinate (TOPROL -XL) 50 MG 24 hr tablet Take 1 tablet (50 mg total) by mouth daily. Take with or immediately following a meal. 90 tablet 3   oxyCODONE -acetaminophen  (PERCOCET/ROXICET) 5-325 MG tablet Take 1 tablet by mouth every 8 (eight) hours as needed for severe pain (pain score 7-10). (Patient taking differently: Take 1 tablet by mouth every 8 (eight) hours as needed for severe pain (pain score 7-10). As needed) 30 tablet 0   pantoprazole  (PROTONIX ) 40 MG tablet TAKE 1 TABLET BY MOUTH EVERY DAY 90 tablet 1   polycarbophil (FIBERCON) 625 MG tablet Take 625 mg by mouth daily.     ramelteon  (ROZEREM ) 8 MG tablet TAKE 1 TABLET (8 MG TOTAL) BY MOUTH AT BEDTIME AS NEEDED FOR SLEEP. TAKE 30 MINUTES BEFORE BEDTIME. 90 tablet 1   senna-docusate (SENOKOT-S) 8.6-50 MG tablet Take 1 tablet by mouth at bedtime as needed for mild constipation.     traZODone  (DESYREL ) 100 MG tablet TAKE 2 TABLETS BY MOUTH AT BEDTIME. 180 tablet 1   No current facility-administered medications for this visit.   No results found.  Review of Systems:   A ROS was performed including pertinent positives and negatives as documented in the HPI.  Physical Exam :   Constitutional: NAD and appears stated age Neurological: Alert and oriented Psych: Appropriate affect and cooperative There were no vitals taken for this visit.   Comprehensive Musculoskeletal Exam:    No midline tenderness of the lumbar spine.  There is mild tenderness in bilateral lumbar regions that does not extend into the lower extremities.  Full lumbar ROM with flexion, extension, and bilateral  rotation, although flexion particularly elicits feeling of tightness.  Negative straight leg raise and Faber bilaterally with full passive hip ROM. No obvious deformity noted to the left pinky finger.  Mild swelling noted over the dorsal aspect of the fifth PIP joint with mild tenderness of this area.  Able to form a full fist, only lacking a few degrees of flexion in the fifth finger.  Neurosensory exam intact.  Imaging:   Xray (left fifth finger 3 views): No evidence of bony abnormality   I personally reviewed and interpreted the radiographs.   Assessment:   48 y.o. female with chronic bilateral low back pain now ongoing for almost a year.  This does not appear to have any radicular component.  She has improved in the past with the Medrol  Dosepak, which I have offered to send in today.  Discussed that I would also like to have her work with physical therapy for her low back, to address any underlying muscular deficits or posture corrections that may be contributing.  In regards to her pinky finger, there does not appear to be any underlying bony abnormality.  She overall retains good function but still describes some continued pain though she is able to tolerate this.  I would like her to continue to monitor for any change in symptoms, and if this does not improve over the coming weeks we can investigate this further.  She can buddy tape as needed if becomes more bothersome.  Will plan to see her back as needed.  Plan :    - Referral to physical therapy for evaluation and treatment of chronic bilateral low back pain     I personally saw and evaluated the  patient, and participated in the management and treatment plan.  Sharrell Deck, PA-C Orthopedics

## 2023-08-07 ENCOUNTER — Other Ambulatory Visit: Payer: Self-pay | Admitting: Student

## 2023-08-07 DIAGNOSIS — F418 Other specified anxiety disorders: Secondary | ICD-10-CM

## 2023-08-07 DIAGNOSIS — F411 Generalized anxiety disorder: Secondary | ICD-10-CM

## 2023-08-08 NOTE — Telephone Encounter (Signed)
 Please see message in regards to a form from [employer] being faxed.  Copied from CRM [id number]. Topic: General - Other >> Aug 05, 2023  2:35 PM Kimberly Terrell wrote: Reason for CRM: [employer] called to confirm office received their fax from for medical records request on 08/04/23

## 2023-08-11 ENCOUNTER — Ambulatory Visit (INDEPENDENT_AMBULATORY_CARE_PROVIDER_SITE_OTHER): Admitting: Behavioral Health

## 2023-08-11 ENCOUNTER — Encounter: Payer: Self-pay | Admitting: Behavioral Health

## 2023-08-11 VITALS — BP 96/52 | HR 72 | Ht 64.0 in | Wt 185.0 lb

## 2023-08-11 DIAGNOSIS — F411 Generalized anxiety disorder: Secondary | ICD-10-CM

## 2023-08-11 DIAGNOSIS — Z6379 Other stressful life events affecting family and household: Secondary | ICD-10-CM | POA: Diagnosis not present

## 2023-08-11 DIAGNOSIS — Z638 Other specified problems related to primary support group: Secondary | ICD-10-CM

## 2023-08-11 DIAGNOSIS — F331 Major depressive disorder, recurrent, moderate: Secondary | ICD-10-CM | POA: Diagnosis not present

## 2023-08-11 DIAGNOSIS — G4700 Insomnia, unspecified: Secondary | ICD-10-CM

## 2023-08-11 MED ORDER — BUSPIRONE HCL 30 MG PO TABS: 30.0000 mg | ORAL_TABLET | Freq: Two times a day (BID) | ORAL | 1 refills | Status: DC

## 2023-08-11 MED ORDER — TRAZODONE HCL 100 MG PO TABS: 200.0000 mg | ORAL_TABLET | Freq: Every day | ORAL | 1 refills | Status: DC

## 2023-08-11 MED ORDER — DESVENLAFAXINE SUCCINATE ER 50 MG PO TB24: 50.0000 mg | ORAL_TABLET | Freq: Every day | ORAL | 1 refills | Status: DC

## 2023-08-11 MED ORDER — RAMELTEON 8 MG PO TABS: 8.0000 mg | ORAL_TABLET | Freq: Every evening | ORAL | 1 refills | Status: DC | PRN
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
## 2023-08-18 ENCOUNTER — Ambulatory Visit: Payer: Self-pay | Admitting: Student

## 2023-08-19 ENCOUNTER — Ambulatory Visit: Admitting: Family

## 2023-08-19 DIAGNOSIS — M75102 Unspecified rotator cuff tear or rupture of left shoulder, not specified as traumatic: Secondary | ICD-10-CM

## 2023-08-19 DIAGNOSIS — M7542 Impingement syndrome of left shoulder: Secondary | ICD-10-CM | POA: Diagnosis not present

## 2023-08-19 DIAGNOSIS — M7541 Impingement syndrome of right shoulder: Secondary | ICD-10-CM | POA: Diagnosis not present

## 2023-08-22 ENCOUNTER — Encounter: Payer: Self-pay | Admitting: Family

## 2023-08-22 DIAGNOSIS — M7541 Impingement syndrome of right shoulder: Secondary | ICD-10-CM

## 2023-08-22 DIAGNOSIS — M7542 Impingement syndrome of left shoulder: Secondary | ICD-10-CM

## 2023-08-22 DIAGNOSIS — M75102 Unspecified rotator cuff tear or rupture of left shoulder, not specified as traumatic: Secondary | ICD-10-CM | POA: Diagnosis not present

## 2023-08-22 NOTE — Progress Notes (Signed)
 Office Visit Note   Patient: Kimberly Terrell           Date of Birth: [birth year]           MRN: [id number] Visit Date: 08/19/2023              Requested by: Timothy Ford, MD [address] [place],  [place] [address] PCP: Cathey Clunes, MD  Chief Complaint  Patient presents with   Right Shoulder - Pain   Left Shoulder - Pain      HPI: The patient is a 48 year old woman who presents today for complaining of bilateral shoulder pain which is chronic.  She does have known rotator cuff injury on the left.  She has previously done okay with Depo-Medrol  injections however her last injection did not provide is lasting of relief.  She is having increased pain difficulty sleeping she has had loss of range of motion difficulty reaching above the head difficulty with her activities of daily living and caring for her grandchild due to her shoulder pain Assessment & Plan: Visit Diagnoses: No diagnosis found.  Plan: Would like to proceed with arthroscopy of the left shoulder as previously discussed with Dr. Julio Ohm.  We will set this up at her convenience.  Follow-Up Instructions: No follow-ups on file.   Right Shoulder Exam   Tenderness  The patient is experiencing tenderness in the biceps tendon.  Range of Motion  The patient has normal right shoulder ROM.  Muscle Strength  The patient has normal right shoulder strength.  Tests  Impingement: positive  Other  Sensation: normal Pulse: present   Left Shoulder Exam   Tenderness  The patient is experiencing tenderness in the biceps tendon.  Range of Motion  Active abduction:  70   Tests  Impingement: positive Drop arm: negative  Other  Sensation: normal Pulse: present       Patient is alert, oriented, no adenopathy, well-dressed, normal affect, normal respiratory effort.   Imaging: No results found. No images are attached to the encounter.  Labs: Lab Results  Component Value Date   HGBA1C 5.6  11/17/2022   REPTSTATUS 12/12/2021 FINAL 12/06/2021   CULT  12/06/2021    NO GROWTH 5 DAYS Performed at [facility], [address], [place], [place] [address]      Lab Results  Component Value Date   ALBUMIN  4.5 08/12/2022   ALBUMIN  4.6 08/06/2022   ALBUMIN  2.1 (L) 12/05/2021    Lab Results  Component Value Date   MG 1.9 12/15/2021   MG 1.8 12/12/2021   MG 1.9 12/09/2021   No results found for: "VD25OH"  No results found for: "PREALBUMIN"    Latest Ref Rng & Units 01/27/2023   11:50 AM 11/17/2022    2:27 PM 08/12/2022    6:45 PM  CBC EXTENDED  WBC 3.4 - 10.8 x10E3/uL 10.5  10.0  10.0   RBC 3.77 - 5.28 x10E6/uL 4.49  4.46  4.26   Hemoglobin 11.1 - 15.9 g/dL 44.0  34.7  42.5   HCT 34.0 - 46.6 % 36.2  35.1  33.6   Platelets 150 - 450 x10E3/uL 237  269  336      There is no height or weight on file to calculate BMI.  Orders:  No orders of the defined types were placed in this encounter.  No orders of the defined types were placed in this encounter.    Procedures: Large Joint Inj: bilateral subacromial bursa on 08/22/2023  5:47 AM Indications: pain Details: 22 G 1.5 in needle Consent was given by the patient.      Clinical Data: No additional findings.  ROS:  All other systems negative, except as noted in the HPI. Review of Systems  Objective: Vital Signs: There were no vitals taken for this visit.  Specialty Comments:  No specialty comments available.  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 pylori infection 03/28/2019   Allergic rhinitis 07/25/2017   Depression with anxiety 04/04/2017   Migraine 04/04/2017   DUB (dysfunctional uterine bleeding) 04/04/2017   Past Medical History:  Diagnosis Date   Anemia    Anxiety    Arthritis    Asthma    Bursitis    Right Hip and Right Knee   Depression    Dysrhythmia    H. pylori infection    s/p triple therapy x 2   Migraines     Family History  Problem Relation Age of Onset   Anxiety disorder Mother    Depression Mother    Depression Maternal Aunt    Anxiety disorder Maternal Aunt    Depression Paternal Uncle    Anxiety disorder Paternal Uncle    Depression Daughter    Colon cancer Neg Hx    Esophageal cancer Neg Hx    Gastric cancer Neg Hx    Inflammatory bowel disease Neg Hx    Liver disease Neg Hx    Pancreatic cancer Neg Hx    Rectal cancer Neg Hx    Stomach cancer Neg Hx     Past Surgical History:  Procedure Laterality Date   AUGMENTATION MAMMAPLASTY Bilateral    COSMETIC SURGERY  2008   breast implants    EXCISION MORTON'S NEUROMA Right 02/23/2023   Procedure: RIGHT FOOT EXCISION MASS 2ND WEB SPACE;  Surgeon: Timothy Ford, MD;  Location: [facility] OR;  Service: Orthopedics;  Laterality: Right;   gastric ulcer surgery     gastric ulcer perforation surgery   LAPAROTOMY N/A 12/04/2021   Procedure: EXPLORATORY LAPAROTOMY;  Surgeon: Sim Dryer, MD;  Location: [facility] OR;  Service: General;  Laterality: N/A;   Social History   Occupational History   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, advertising  Tobacco Use   Smoking status: Former    Current packs/day: 0.00     Types: Cigarettes    Quit date: 12/03/2021    Years since quitting: 1.7   Smokeless tobacco: Never   Tobacco comments:    Trying   Vaping Use   Vaping status: Never Used  Substance and Sexual Activity   Alcohol use: No   Drug use: No   Sexual activity: Yes    Birth control/protection: Surgical    Comment:  husband has had a vasectomy

## 2023-09-02 ENCOUNTER — Other Ambulatory Visit: Payer: Self-pay | Admitting: Behavioral Health

## 2023-09-02 DIAGNOSIS — F331 Major depressive disorder, recurrent, moderate: Secondary | ICD-10-CM

## 2023-09-02 DIAGNOSIS — F411 Generalized anxiety disorder: Secondary | ICD-10-CM

## 2023-09-06 ENCOUNTER — Other Ambulatory Visit: Payer: Self-pay | Admitting: Behavioral Health

## 2023-09-06 DIAGNOSIS — F411 Generalized anxiety disorder: Secondary | ICD-10-CM

## 2023-09-08 ENCOUNTER — Encounter: Payer: Self-pay | Admitting: Behavioral Health

## 2023-09-08 ENCOUNTER — Ambulatory Visit: Admitting: Behavioral Health

## 2023-09-08 ENCOUNTER — Telehealth: Payer: Self-pay | Admitting: *Deleted

## 2023-09-08 DIAGNOSIS — F331 Major depressive disorder, recurrent, moderate: Secondary | ICD-10-CM

## 2023-09-08 DIAGNOSIS — F39 Unspecified mood [affective] disorder: Secondary | ICD-10-CM

## 2023-09-08 DIAGNOSIS — F411 Generalized anxiety disorder: Secondary | ICD-10-CM

## 2023-09-08 DIAGNOSIS — Z8639 Personal history of other endocrine, nutritional and metabolic disease: Secondary | ICD-10-CM

## 2023-09-08 MED ORDER — DESVENLAFAXINE SUCCINATE ER 100 MG PO TB24: 100.0000 mg | ORAL_TABLET | Freq: Every day | ORAL | 3 refills | Status: DC

## 2023-09-08 MED ORDER — LAMOTRIGINE 25 MG PO TABS: ORAL_TABLET | ORAL | 0 refills | Status: DC

## 2023-09-08 NOTE — Progress Notes (Signed)
 [facility]  Patient ID: Mykelti Goldenstein. Fatzinger,  MRN: [id number]  PCP: Cathey Clunes, MD  Date of Evaluation: 09/08/2023 Time spent:30 minutes  Chief Complaint:  Chief Complaint   Follow-up; Anxiety; Depression; Medication Refill; Patient Education; Stress; Irritability     HISTORY/CURRENT STATUS: HPI Kimberly Terrell, 48 year old female presents to this office for follow up and medication management. Collateral information should be considered reliable.  Patient is calm and pleasant.  Says that she is about 50% better since last visit. She is happy with Pristiq  but still having problems with mood shifts and irritability.  She is requesting medication for her moods. Light ETOH use.  She denies any illicit drug use.  Says that her anxiety today is 4/10, and depression is 4/10.   Says that she does get 7 to 8 hours of sleep with the aid of trazodone  and Rozerem .  She denies history of mania, no psychosis, no auditory or visual hallucinations.  Denies SI or HI.   Past psychiatric medication trials: Xanax  BuSpar  Citalopram  Trazodone  Rozerem  Lexapro    Individual Medical History/ Review of Systems: Changes? :No   Allergies: Imitrex  [sumatriptan ], Tape, and Aspirin  Current Medications:  Current Outpatient Medications:    desvenlafaxine  (PRISTIQ ) 100 MG 24 hr tablet, Take 1 tablet (100 mg total) by mouth daily., Disp: 30 tablet, Rfl: 3   lamoTRIgine (LAMICTAL) 25 MG tablet, Take 1 tablet (25 mg total) by mouth daily for 14 days, THEN 2 tablets (50 mg total) daily for 14 days., Disp: 45 tablet, Rfl: 0   acetaminophen  (TYLENOL ) 500 MG tablet, Take 2 tablets (1,000 mg total) by mouth every 6 (six) hours as needed., Disp: 30 tablet, Rfl: 0   Acetaminophen -guaiFENesin  (MUCINEX  COLD & FLU) 325-200 MG CAPS, Take 2 tablets by mouth daily as needed (cold symptoms)., Disp: , Rfl:    Budeson-Glycopyrrol-Formoterol (BREZTRI  AEROSPHERE) 160-9-4.8 MCG/ACT AERO, Inhale 2 puffs into the  lungs in the morning and at bedtime., Disp: 1 each, Rfl: 11   busPIRone  (BUSPAR ) 10 MG tablet, TAKE 2 TABLETS BY MOUTH 3 TIMES A DAY, Disp: 540 tablet, Rfl: 1   busPIRone  (BUSPAR ) 30 MG tablet, Take 1 tablet (30 mg total) by mouth 2 (two) times daily., Disp: 60 tablet, Rfl: 1   citalopram  (CELEXA ) 40 MG tablet, Take 1 tablet (40 mg total) by mouth daily., Disp: 30 tablet, Rfl: 2   desvenlafaxine  (PRISTIQ ) 50 MG 24 hr tablet, Take 1 tablet (50 mg total) by mouth daily., Disp: 30 tablet, Rfl: 1   diclofenac  sodium (VOLTAREN ) 1 % GEL, Apply 2 g topically 4 (four) times daily. (Patient taking differently: Apply 2 g topically 4 (four) times daily as needed (pain).), Disp: 300 g, Rfl: 1   gabapentin  (NEURONTIN ) 400 MG capsule, Take 1 capsule (400 mg total) by mouth 4 (four) times daily., Disp: 120 capsule, Rfl: 2   hydrOXYzine  (ATARAX ) 10 MG tablet, TAKE 1 TABLET BY MOUTH THREE TIMES A DAY AS NEEDED, Disp: 270 tablet, Rfl: 1   levalbuterol  (XOPENEX  HFA) 45 MCG/ACT inhaler, Inhale 2 puffs into the lungs every 6 (six) hours as needed for wheezing or shortness of breath., Disp: 1 each, Rfl: 12   levofloxacin  (LEVAQUIN ) 750 MG tablet, Take 1 tablet (750 mg total) by mouth daily., Disp: 10 tablet, Rfl: 0   methylPREDNISolone  (MEDROL  DOSEPAK) 4 MG TBPK tablet, Take per packet instructions, Disp: 1 each, Rfl: 0   metoprolol  succinate (TOPROL -XL) 50 MG 24 hr tablet, Take 1 tablet (50 mg total) by mouth daily. Take with  or immediately following a meal., Disp: 90 tablet, Rfl: 3   pantoprazole  (PROTONIX ) 40 MG tablet, TAKE 1 TABLET BY MOUTH EVERY DAY, Disp: 90 tablet, Rfl: 1   polycarbophil (FIBERCON) 625 MG tablet, Take 625 mg by mouth daily., Disp: , Rfl:    ramelteon  (ROZEREM ) 8 MG tablet, Take 1 tablet (8 mg total) by mouth at bedtime as needed for sleep. Take 30 minutes before bedtime., Disp: 90 tablet, Rfl: 1   senna-docusate (SENOKOT-S) 8.6-50 MG tablet, Take 1 tablet by mouth at bedtime as needed for mild  constipation., Disp: , Rfl:    traZODone  (DESYREL ) 100 MG tablet, Take 2 tablets (200 mg total) by mouth at bedtime., Disp: 180 tablet, Rfl: 1 Medication Side Effects: anxiety  Family Medical/ Social History: Changes? No  MENTAL HEALTH EXAM:  There were no vitals taken for this visit.There is no height or weight on file to calculate BMI.  General Appearance: Casual  Eye Contact:  Good  Speech:  Clear and Coherent  Volume:  Normal  Mood:  Depressed, Dysphoric, and Irritable  Affect:  Depressed and Anxious  Thought Process:  Coherent  Orientation:  Full (Time, Place, and Person)  Thought Content: Logical   Suicidal Thoughts:  No  Homicidal Thoughts:  No  Memory:  WNL  Judgement:  Good  Insight:  Good  Psychomotor Activity:  NA  Concentration:  Concentration: Good  Recall:  Good  Fund of Knowledge: Good  Language: Good  Assets:  Desire for Improvement  ADL's:  Intact  Cognition: WNL  Prognosis:  Good    DIAGNOSES:    ICD-10-CM   1. Generalized anxiety disorder  F41.1 desvenlafaxine  (PRISTIQ ) 100 MG 24 hr tablet    2. Major depressive disorder, recurrent episode, moderate (HCC)  F33.1 lamoTRIgine (LAMICTAL) 25 MG tablet    desvenlafaxine  (PRISTIQ ) 100 MG 24 hr tablet    3. Unspecified mood (affective) disorder (HCC)  F39 lamoTRIgine (LAMICTAL) 25 MG tablet    desvenlafaxine  (PRISTIQ ) 100 MG 24 hr tablet      Receiving Psychotherapy: No    RECOMMENDATIONS:  Greater than 50% of 60 min face to face time with patient was spent on counseling and coordination of care. We discussed her moderate improvement since starting Pristiq  but continue issues with irritability and mood swings. Discussed medication options and possible side effect profiles.   We agreed to today to: To start Lamictal 25 mg for 14 days, then 50 mg daily Monitor for any sign of rash. Please stop taking Lamictal and contact office immediately rash develops. Recommend seeking urgent medical attention if rash  is severe and/or spreading quickly.  Increase  Pristiq  to 100 mg daily in the a.m. after breakfast Will continue hydroxyzine  10 mg 3 times daily as needed Continue  BuSpar  30 mg twice daily We will follow-up in 4 weeks to reassess To report worsening symptoms or side effects promptly Provided emergency contact information Reviewed PDMP    Lincoln Renshaw, NP

## 2023-09-08 NOTE — Telephone Encounter (Signed)
 Copied from CRM [id number]. Topic: Clinical - Request for Lab/Test Order >> Sep 08, 2023  3:11 PM Karole Pacer C wrote: Reason for CRM: Patient would like provider to place lab order to check iron levels.

## 2023-09-08 NOTE — Telephone Encounter (Signed)
 Pt stated she just want her iron levels check b/c she did not have the iron infusion when it was scheduled. And stated now things are slowing down in her life. I told her when an order is placed, we will call her to schedule an appt.

## 2023-09-09 ENCOUNTER — Telehealth: Payer: Self-pay

## 2023-09-09 ENCOUNTER — Telehealth: Payer: Self-pay | Admitting: Pharmacy Technician

## 2023-09-09 DIAGNOSIS — D509 Iron deficiency anemia, unspecified: Secondary | ICD-10-CM | POA: Insufficient documentation

## 2023-09-09 NOTE — Telephone Encounter (Signed)
 Patient referred to infusion pharmacy team for ambulatory infusion of IV iron.  Insurance - [employer], [employer] [facility] Care - [facility] at [facility] Dx code - D50.9 IV Iron Therapy - Ferhame 510mg  x2 Infusion appointments - Scheduling team will schedule patient as soon as possible.

## 2023-09-09 NOTE — Telephone Encounter (Signed)
 Dr. Jarvis Mesa, patient will be scheduled as soon as possible.  Auth Submission: NO AUTH NEEDED Site of care: Site of care: [facility] Payer: [employer] Medication & CPT/J Code(s) submitted: Feraheme (ferumoxytol) [id number] Diagnosis Code:  Route of submission (phone, fax, portal): phone Phone # [phone] Fax # Auth type: Buy/Bill PB Units/visits requested: 510mg  x 2 doses Reference number: confirmed on the automated system Approval from: 09/09/23 to 01/09/24

## 2023-09-12 NOTE — Telephone Encounter (Unsigned)
 Copied from CRM [id number]. Topic: Referral - Question >> Sep 12, 2023 11:43 AM Tisa Forester wrote: Reason for CRM:  Patient was sent to the wrong [facility] center on [address] in [place]  is out of network with her insurance which is [employer]    was [place] told would do at [facility]  Need another referral for infusion call back  [phone]   ----------------------------------------------------------------------- From previous Reason for Contact - Referral Request: Did the patient discuss referral with their provider in the last year?   (If No - schedule appointment) (If Yes - send message)  Appointment offered?    Type of order/referral and detailed reason for visit:   Preference of office, provider, location:   If referral order, have you been seen by this specialty before?   (If Yes, this issue or another issue? When? Where?  Can we respond through MyChart? >> Sep 12, 2023 12:02 PM Tiffany H wrote: Husband called back to confirm callback number. Please take a few minutes to find out which blood infusion clinics accept [employer] in the area.

## 2023-09-14 ENCOUNTER — Ambulatory Visit: Admitting: Orthopedic Surgery

## 2023-09-14 NOTE — Telephone Encounter (Signed)
 I called the patient to advise her of the previous message, unable to reach the patient. I lvm for her to give us  a call back.

## 2023-09-16 ENCOUNTER — Ambulatory Visit: Admitting: Orthopedic Surgery

## 2023-09-16 DIAGNOSIS — M7542 Impingement syndrome of left shoulder: Secondary | ICD-10-CM | POA: Diagnosis not present

## 2023-09-17 ENCOUNTER — Encounter: Payer: Self-pay | Admitting: Orthopedic Surgery

## 2023-09-17 NOTE — Progress Notes (Signed)
 Office Visit Note   Patient: Kimberly Terrell. Cipriani           Date of Birth: 06-16-1975           MRN: [id number] Visit Date: 09/16/2023 Requested by: Elnora Ip, MD [address] [place],  [place] [address] PCP: Elnora Ip, MD  Subjective: Chief Complaint  Patient presents with   Left Shoulder - Pain   Right Shoulder - Pain    HPI: Nadeen Shipman. Miranda is a 48 y.o. female who presents to the office reporting bilateral shoulder pain left worse than right.  Patient has had chronic pain in her shoulder for years.  She has had prior injections with some relief but the most recent injection did not help.  She is right-hand dominant.  Pain wakes her from sleep at night.  She can take Tylenol  only because of a history of stomach ulceration and perforation 2 years ago.  Reaching behind her back on the left-hand side is very difficult.  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  She also reports some PIP joint pain in the left small finger with no known history of injury.  Had radiographs of this on 08/09/2023 which were unremarkable.  MRI scan of the left shoulder from over a year ago demonstrates partial-thickness midsubstance tear of the supraspinatus tendon footprint..                ROS: All systems reviewed are negative as they relate to the chief complaint within the history of present illness.  Patient denies fevers or chills.  Assessment & Plan: Visit Diagnoses:  1. Impingement syndrome of left shoulder     Plan: Impression is left shoulder pain.  I think based on exam with some of the popping that she is having with passive range of motion she may have progressed to a full-thickness rotator cuff tear.  Needs up-to-date MRI arthrogram of that left shoulder based on failure of conservative treatment with failure of home exercise program and physical therapy type exercises.  Also because of this atypical PIP joint pain and swelling with no history of injury a rheumatologic  workup is indicated.  We will draw CBC DIF sed rate C-reactive protein as well as anti-CCP antibody and ANA and uric acid and rheumatoid factor.  Follow-up after the MRI scan.  This is bad enough that she wants to consider surgical intervention.  Does not look like frozen shoulder but that would be a consideration  Follow-Up Instructions: No follow-ups on file.   Orders:  Orders Placed This Encounter  Procedures   MR Shoulder Left w/ contrast   Arthrogram   CBC with Differential   Sed Rate (ESR)   C-reactive protein   Cyclic citrul peptide antibody, IgG   Rheumatoid Factor   Antinuclear Antib (ANA)   Uric acid   No orders of the defined types were placed in this encounter.     Procedures: No procedures performed   Clinical Data: No additional findings.  Objective: Vital Signs: There were no vitals taken for this visit.  Physical Exam:  Constitutional: Patient appears well-developed HEENT:  Head: Normocephalic Eyes:EOM are normal Neck: Normal range of motion Cardiovascular: Normal rate Pulmonary/chest: Effort normal Neurologic: Patient is alert Skin: Skin is warm Psychiatric: Patient has normal mood and affect  Ortho Exam:.  Ortho exam demonstrates good cervical spine range of motion.  Bilateral shoulder range of motion is 60/90/165 on the right and about 150 on the left.  Does have some  popping with internal/external rotation at 90 degrees of abduction on that left-hand side.  No discrete AC joint tenderness on the left.  Subscap and external rotation strength 5+ out of 5 bilaterally.  Equivocal O'Brien's testing on the left negative on the right.  Specialty Comments:  No specialty comments available.  Imaging: No results found.   PMFS History: Patient Active Problem List   Diagnosis Date Noted   Iron deficiency anemia, unspecified 09/09/2023   Productive cough 02/03/2023   Cervical cancer screening 01/28/2023   Right knee pain 01/28/2023   History of iron  deficiency 12/24/2022   PVC (premature ventricular contraction) 11/17/2022   Leg cramping 11/17/2022   Colon polyps 08/09/2022   Health care maintenance 04/06/2022   Hemorrhoids 04/06/2022   Insomnia 03/09/2022   Pruritic dermatitis 02/04/2022   Heart failure with recovered ejection fraction (HFrecEF) (HCC) 01/04/2022   Perforated viscus 12/04/2021   Gastric ulcer 12/04/2021   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 pylori infection 03/28/2019   Allergic rhinitis 07/25/2017   Depression with anxiety 04/04/2017   Migraine 04/04/2017   DUB (dysfunctional uterine bleeding) 04/04/2017   Past Medical History:  Diagnosis Date   Anemia    Anxiety    Arthritis    Asthma    Bursitis    Right Hip and Right Knee   Depression    Dysrhythmia    H. pylori infection    s/p triple therapy x 2   Migraines     Family History  Problem Relation Age of Onset   Anxiety disorder Mother    Depression Mother    Depression Maternal Aunt    Anxiety disorder Maternal Aunt    Depression Paternal Uncle    Anxiety disorder Paternal Uncle    Depression Daughter    Colon cancer Neg Hx    Esophageal cancer Neg Hx    Gastric cancer Neg Hx    Inflammatory bowel disease Neg Hx    Liver disease Neg Hx    Pancreatic cancer Neg Hx    Rectal cancer Neg Hx    Stomach cancer Neg Hx     Past Surgical History:  Procedure Laterality Date   AUGMENTATION MAMMAPLASTY Bilateral    COSMETIC SURGERY  2008   breast implants    EXCISION MORTON'S NEUROMA Right 02/23/2023   Procedure: RIGHT FOOT EXCISION MASS 2ND WEB SPACE;  Surgeon: Harden Jerona GAILS, MD;  Location: [facility] OR;  Service: Orthopedics;  Laterality: Right;   gastric ulcer surgery     gastric ulcer  perforation surgery   LAPAROTOMY N/A 12/04/2021   Procedure: EXPLORATORY LAPAROTOMY;  Surgeon: Vanderbilt Ned, MD;  Location: [facility] OR;  Service: General;  Laterality: N/A;   Social History   Occupational History   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, advertising  Tobacco Use   Smoking status: Former    Current packs/day: 0.00    Types: Cigarettes    Quit date: 12/03/2021    Years since quitting: 1.7   Smokeless tobacco: Never   Tobacco comments:    Trying   Vaping Use   Vaping status: Never Used  Substance and Sexual Activity   Alcohol use: No   Drug use: No   Sexual activity: Yes    Birth control/protection: Surgical  Comment: husband has had a vasectomy

## 2023-09-19 ENCOUNTER — Telehealth: Payer: Self-pay | Admitting: *Deleted

## 2023-09-20 ENCOUNTER — Ambulatory Visit: Payer: Self-pay | Admitting: Orthopedic Surgery

## 2023-09-20 ENCOUNTER — Ambulatory Visit

## 2023-09-20 ENCOUNTER — Other Ambulatory Visit: Payer: Self-pay | Admitting: Student

## 2023-09-20 DIAGNOSIS — L308 Other specified dermatitis: Secondary | ICD-10-CM

## 2023-09-20 LAB — ANA: Anti Nuclear Antibody (ANA): NEGATIVE

## 2023-09-20 LAB — CBC WITH DIFFERENTIAL/PLATELET
Absolute Lymphocytes: 1575 {cells}/uL (ref 850–3900)
Absolute Monocytes: 560 {cells}/uL (ref 200–950)
Basophils Absolute: 98 {cells}/uL (ref 0–200)
Basophils Relative: 1.4 %
Eosinophils Absolute: 259 {cells}/uL (ref 15–500)
Eosinophils Relative: 3.7 %
HCT: 39.8 % (ref 35.0–45.0)
Hemoglobin: 12 g/dL (ref 11.7–15.5)
MCH: 25.4 pg — ABNORMAL LOW (ref 27.0–33.0)
MCHC: 30.2 g/dL — ABNORMAL LOW (ref 32.0–36.0)
MCV: 84.1 fL (ref 80.0–100.0)
MPV: 12.5 fL (ref 7.5–12.5)
Monocytes Relative: 8 %
Neutro Abs: 4508 {cells}/uL (ref 1500–7800)
Neutrophils Relative %: 64.4 %
Platelets: 300 10*3/uL (ref 140–400)
RBC: 4.73 10*6/uL (ref 3.80–5.10)
RDW: 15.7 % — ABNORMAL HIGH (ref 11.0–15.0)
Total Lymphocyte: 22.5 %
WBC: 7 10*3/uL (ref 3.8–10.8)

## 2023-09-20 LAB — SEDIMENTATION RATE: Sed Rate: 11 mm/h (ref 0–20)

## 2023-09-20 LAB — C-REACTIVE PROTEIN: CRP: 3.7 mg/L (ref <8.0)

## 2023-09-20 LAB — URIC ACID: Uric Acid, Serum: 4.6 mg/dL (ref 2.5–7.0)

## 2023-09-20 LAB — RHEUMATOID FACTOR: Rheumatoid fact SerPl-aCnc: 34 [IU]/mL — ABNORMAL HIGH (ref <14)

## 2023-09-20 LAB — CYCLIC CITRUL PEPTIDE ANTIBODY, IGG: Cyclic Citrullin Peptide Ab: 48 U — ABNORMAL HIGH

## 2023-09-20 NOTE — Progress Notes (Signed)
 Hi Kimberly Terrell.  Looks like she may have rheumatoid arthritis.  She will need follow-up after her MRI scan.  Thanks

## 2023-09-25 ENCOUNTER — Other Ambulatory Visit: Payer: Self-pay | Admitting: Family

## 2023-09-25 DIAGNOSIS — G5761 Lesion of plantar nerve, right lower limb: Secondary | ICD-10-CM

## 2023-09-28 ENCOUNTER — Telehealth: Payer: Self-pay | Admitting: *Deleted

## 2023-09-28 NOTE — Telephone Encounter (Signed)
 I called the Infusion Ctr on [facility]; talked to [place] - pt has not been scheduled there but she stated she will f/u. I called pt who stated her insurance will not cover the [facility] location;it will at [facility]. Infusion Ctr at [facility] telephone # [phone]) given to pt.

## 2023-09-28 NOTE — Telephone Encounter (Signed)
 Copied from CRM [id number]. Topic: General - Other >> Sep 28, 2023  9:18 AM Susanna ORN wrote: Reason for CRM: Patient would like for the nurse to give her a call. She wants to get the information on the iron infusion location of where she was referred to. Needing the phone number but the one she was given does not work. Please give her a call back. CB #: [phone].

## 2023-09-29 ENCOUNTER — Ambulatory Visit: Admitting: Student

## 2023-09-30 ENCOUNTER — Other Ambulatory Visit: Payer: Self-pay | Admitting: Behavioral Health

## 2023-09-30 DIAGNOSIS — F331 Major depressive disorder, recurrent, moderate: Secondary | ICD-10-CM

## 2023-09-30 DIAGNOSIS — F411 Generalized anxiety disorder: Secondary | ICD-10-CM

## 2023-09-30 DIAGNOSIS — F39 Unspecified mood [affective] disorder: Secondary | ICD-10-CM

## 2023-10-02 ENCOUNTER — Other Ambulatory Visit: Payer: Self-pay | Admitting: Cardiovascular Disease

## 2023-10-03 ENCOUNTER — Telehealth: Payer: Self-pay | Admitting: Behavioral Health

## 2023-10-03 ENCOUNTER — Encounter (HOSPITAL_BASED_OUTPATIENT_CLINIC_OR_DEPARTMENT_OTHER): Payer: Self-pay | Admitting: Student

## 2023-10-03 ENCOUNTER — Ambulatory Visit (HOSPITAL_BASED_OUTPATIENT_CLINIC_OR_DEPARTMENT_OTHER): Admitting: Student

## 2023-10-03 DIAGNOSIS — M7542 Impingement syndrome of left shoulder: Secondary | ICD-10-CM

## 2023-10-03 DIAGNOSIS — F39 Unspecified mood [affective] disorder: Secondary | ICD-10-CM

## 2023-10-03 DIAGNOSIS — F331 Major depressive disorder, recurrent, moderate: Secondary | ICD-10-CM

## 2023-10-03 MED ORDER — TRAMADOL HCL 50 MG PO TABS: 50.0000 mg | ORAL_TABLET | Freq: Four times a day (QID) | ORAL | 0 refills | Status: AC | PRN

## 2023-10-03 MED ORDER — LAMOTRIGINE 25 MG PO TABS: ORAL_TABLET | ORAL | 0 refills | Status: DC

## 2023-10-03 NOTE — Addendum Note (Signed)
 Addended by: CON BRISTLE T on: 10/03/2023 10:08 AM   Modules accepted: Orders

## 2023-10-03 NOTE — Telephone Encounter (Signed)
 Pt states she doesn't have enough Lamictal  25mg  to last until her next appt on 7/17.   [facility] [address] [place], [place] [address]

## 2023-10-03 NOTE — Telephone Encounter (Signed)
 Rx sent.

## 2023-10-03 NOTE — Progress Notes (Unsigned)
 Chief Complaint: Left shoulder pain     History of Present Illness:    Kimberly Terrell is a 48 y.o. right-hand-dominant female who presents today for evaluation of worsening left shoulder pain.  She was last seen by Dr. Addie almost 2 weeks ago at which time an updated MRI arthrogram was ordered of the shoulder which is scheduled for 7/14.  Patient reports that over the last few days, her shoulder pain has increased with notable deficits in range of motion.  Denies any recent injury or overuse.  She has been taking Tylenol  for pain without any significant relief and is unable to take NSAIDs due to history of stomach perforation.  Has also been not able to get much sleep due to pain.  Patient is leaving on a cruise vacation in 9 days.   Surgical History:   None  PMH/PSH/Family History/Social History/Meds/Allergies:    Past Medical History:  Diagnosis Date   Anemia    Anxiety    Arthritis    Asthma    Bursitis    Right Hip and Right Knee   Depression    Dysrhythmia    H. pylori infection    s/p triple therapy x 2   Migraines    Past Surgical History:  Procedure Laterality Date   AUGMENTATION MAMMAPLASTY Bilateral    COSMETIC SURGERY  2008   breast implants    EXCISION MORTON'S NEUROMA Right 02/23/2023   Procedure: RIGHT FOOT EXCISION MASS 2ND WEB SPACE;  Surgeon: Harden Jerona GAILS, MD;  Location: [facility] OR;  Service: Orthopedics;  Laterality: Right;   gastric ulcer surgery     gastric ulcer perforation surgery   LAPAROTOMY N/A 12/04/2021   Procedure: EXPLORATORY LAPAROTOMY;  Surgeon: Vanderbilt Ned, MD;  Location: [facility] OR;  Service: General;  Laterality: N/A;   Social History   Socioeconomic History   Marital status: Married    Spouse name: Joi Leyva   Number of children: 1   Years of education: 9th   Highest education level: 9th grade  Occupational History   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, advertising  Tobacco Use   Smoking status: Former    Current  packs/day: 0.00    Types: Cigarettes    Quit date: 12/03/2021    Years since quitting: 1.8   Smokeless tobacco: Never   Tobacco comments:    Trying   Vaping Use   Vaping status: Never Used  Substance and Sexual Activity   Alcohol use: No   Drug use: No   Sexual activity: Yes    Birth control/protection: Surgical    Comment: husband has had a vasectomy  Other Topics Concern   Not on file  Social History Narrative   Right handed    Lives with Wadley Rachal   Caffeine use: coffee-daily   Lives in [place], with husband, mother in Social worker, two grandchildren and one trans daughter   Social Drivers of Health   Financial Resource Strain: Low Risk  (04/16/2021)   Received from [facility]   Overall Financial Resource Strain (CARDIA)    Difficulty of Paying Living Expenses: Not hard at all  Food Insecurity: No Food Insecurity (04/29/2023)   Hunger Vital Sign    Worried About Running Out of Food in the Last Year: Never true    Ran Out of Food in  the Last Year: Never true  Transportation Needs: No Transportation Needs (04/29/2023)   PRAPARE - Administrator, Civil Service (Medical): No    Lack of Transportation (Non-Medical): No  Physical Activity: Sufficiently Active (10/31/2020)   Received from [facility]   Exercise Vital Sign    On average, how many days per week do you engage in moderate to strenuous exercise (like a brisk walk)?: 5 days    On average, how many minutes do you engage in exercise at this level?: 30 min  Stress: Stress Concern Present (10/31/2020)   Received from [facility] - Occupational Stress Questionnaire    Feeling of Stress : To some extent  Social Connections: Moderately Integrated (10/31/2020)   Received from [facility]   Social Connection and Isolation Panel    In a typical week, how many times do you talk on the phone with family, friends, or neighbors?: Once a week    How often do you get  together with friends or relatives?: Once a week    How often do you attend church or religious services?: 1 to 4 times per year    Do you belong to any clubs or organizations such as church groups, unions, fraternal or athletic groups, or school groups?: No    How often do you attend meetings of the clubs or organizations you belong to?: 1 to 4 times per year    Are you married, widowed, divorced, separated, never married, or living with a partner?: Married   Family History  Problem Relation Age of Onset   Anxiety disorder Mother    Depression Mother    Depression Maternal Aunt    Anxiety disorder Maternal Aunt    Depression Paternal Uncle    Anxiety disorder Paternal Uncle    Depression Daughter    Colon cancer Neg Hx    Esophageal cancer Neg Hx    Gastric cancer Neg Hx    Inflammatory bowel disease Neg Hx    Liver disease Neg Hx    Pancreatic cancer Neg Hx    Rectal cancer Neg Hx    Stomach cancer Neg Hx    Allergies  Allergen Reactions   Imitrex  [Sumatriptan ] Other (See Comments)    Gave me lockjaw   Tape Other (See Comments)    The plastic-like tape causes redness and irritation   Aspirin Other (See Comments)    Caused chest pain   Current Outpatient Medications  Medication Sig Dispense Refill   traMADol  (ULTRAM ) 50 MG tablet Take 1 tablet (50 mg total) by mouth every 6 (six) hours as needed for up to 5 days. 20 tablet 0   acetaminophen  (TYLENOL ) 500 MG tablet Take 2 tablets (1,000 mg total) by mouth every 6 (six) hours as needed. 30 tablet 0   Acetaminophen -guaiFENesin  (MUCINEX  COLD & FLU) 325-200 MG CAPS Take 2 tablets by mouth daily as needed (cold symptoms).     Budeson-Glycopyrrol-Formoterol (BREZTRI  AEROSPHERE) 160-9-4.8 MCG/ACT AERO Inhale 2 puffs into the lungs in the morning and at bedtime. 1 each 11   busPIRone  (BUSPAR ) 10 MG tablet TAKE 2 TABLETS BY MOUTH 3 TIMES A DAY 540 tablet 1   busPIRone  (BUSPAR ) 30 MG tablet TAKE 1 TABLET BY MOUTH 2 TIMES DAILY. 180  tablet 0   citalopram  (CELEXA ) 40 MG tablet Take 1 tablet (40 mg total) by mouth daily. 30 tablet 2   desvenlafaxine  (PRISTIQ ) 100 MG 24 hr tablet  Take 1 tablet (100 mg total) by mouth daily. 30 tablet 3   desvenlafaxine  (PRISTIQ ) 50 MG 24 hr tablet Take 1 tablet (50 mg total) by mouth daily. 30 tablet 1   diclofenac  sodium (VOLTAREN ) 1 % GEL Apply 2 g topically 4 (four) times daily. (Patient taking differently: Apply 2 g topically 4 (four) times daily as needed (pain).) 300 g 1   gabapentin  (NEURONTIN ) 400 MG capsule TAKE 1 CAPSULE (400 MG TOTAL) BY MOUTH 4 (FOUR) TIMES DAILY. 120 capsule 2   hydrOXYzine  (ATARAX ) 10 MG tablet TAKE 1 TABLET BY MOUTH THREE TIMES A DAY AS NEEDED 270 tablet 1   lamoTRIgine  (LAMICTAL ) 25 MG tablet Take 1 tablet (25 mg total) by mouth daily for 14 days, THEN 2 tablets (50 mg total) daily for 14 days. 45 tablet 0   levalbuterol  (XOPENEX  HFA) 45 MCG/ACT inhaler Inhale 2 puffs into the lungs every 6 (six) hours as needed for wheezing or shortness of breath. 1 each 12   levofloxacin  (LEVAQUIN ) 750 MG tablet Take 1 tablet (750 mg total) by mouth daily. 10 tablet 0   methylPREDNISolone  (MEDROL  DOSEPAK) 4 MG TBPK tablet Take per packet instructions 1 each 0   metoprolol  succinate (TOPROL -XL) 50 MG 24 hr tablet Take 1 tablet (50 mg total) by mouth daily. Take with or immediately following a meal. 90 tablet 3   pantoprazole  (PROTONIX ) 40 MG tablet TAKE 1 TABLET BY MOUTH EVERY DAY 90 tablet 1   polycarbophil (FIBERCON) 625 MG tablet Take 625 mg by mouth daily.     ramelteon  (ROZEREM ) 8 MG tablet Take 1 tablet (8 mg total) by mouth at bedtime as needed for sleep. Take 30 minutes before bedtime. 90 tablet 1   senna-docusate (SENOKOT-S) 8.6-50 MG tablet Take 1 tablet by mouth at bedtime as needed for mild constipation.     traZODone  (DESYREL ) 100 MG tablet Take 2 tablets (200 mg total) by mouth at bedtime. 180 tablet 1   No current facility-administered medications for this visit.    No results found.  Review of Systems:   A ROS was performed including pertinent positives and negatives as documented in the HPI.  Physical Exam :   Constitutional: NAD and appears stated age Neurological: Alert and oriented Psych: Appropriate affect and cooperative There were no vitals taken for this visit.   Comprehensive Musculoskeletal Exam:    Exam of the left shoulder demonstrates tenderness of the anterior glenohumeral joint and lateral deltoid.  Active range of motion significantly limited to 50 degrees flexion and 20 degrees of external rotation.  No overlying erythema or warmth.  Imaging:     Assessment:   48 y.o. female with worsening left shoulder pain not attributed to any known cause.  She does have a repeat MRI arthrogram scheduled for next week ordered by Dr. Addie as there is some concern for progression of a previously identified partial-thickness rotator cuff tear.  Recent labs also have suggested the presence of rheumatoid arthritis with a positive rheumatoid factor and anti-CCP.  I will prescribe her some tramadol  50 mg to aid with symptom relief until she can have her MRI scan completed.  May also consider addition of steroid injection versus oral taper will plan to consult with Dr. Addie on this.  Plan :    - Begin tramadol  50 mg as needed for pain and continue with MRI as scheduled on 7/14     I personally saw and evaluated the patient, and participated in the management and  treatment plan.  Leonce Reveal, PA-C Orthopedics

## 2023-10-04 ENCOUNTER — Other Ambulatory Visit (HOSPITAL_BASED_OUTPATIENT_CLINIC_OR_DEPARTMENT_OTHER): Payer: Self-pay | Admitting: Student

## 2023-10-04 ENCOUNTER — Ambulatory Visit: Payer: Self-pay | Admitting: Student

## 2023-10-04 ENCOUNTER — Telehealth (HOSPITAL_BASED_OUTPATIENT_CLINIC_OR_DEPARTMENT_OTHER): Payer: Self-pay | Admitting: Student

## 2023-10-04 MED ORDER — PREDNISONE 20 MG PO TABS: 40.0000 mg | ORAL_TABLET | Freq: Every day | ORAL | 0 refills | Status: AC

## 2023-10-04 NOTE — Telephone Encounter (Signed)
 SABRA

## 2023-10-04 NOTE — Telephone Encounter (Signed)
 Steroid taper would be better than injection because I think she may consider surgical intervention on her shoulder if pathology is present.  Thanks

## 2023-10-04 NOTE — Telephone Encounter (Signed)
 Spoke to pt. Prednisone  sent to pharmacy.

## 2023-10-06 ENCOUNTER — Ambulatory Visit

## 2023-10-10 ENCOUNTER — Inpatient Hospital Stay: Admission: RE | Admit: 2023-10-10 | Source: Ambulatory Visit

## 2023-10-13 ENCOUNTER — Ambulatory Visit: Admitting: Behavioral Health

## 2023-10-13 ENCOUNTER — Encounter: Payer: Self-pay | Admitting: Behavioral Health

## 2023-10-13 DIAGNOSIS — F419 Anxiety disorder, unspecified: Secondary | ICD-10-CM

## 2023-10-13 DIAGNOSIS — F331 Major depressive disorder, recurrent, moderate: Secondary | ICD-10-CM

## 2023-10-13 DIAGNOSIS — F411 Generalized anxiety disorder: Secondary | ICD-10-CM

## 2023-10-13 DIAGNOSIS — F39 Unspecified mood [affective] disorder: Secondary | ICD-10-CM | POA: Diagnosis not present

## 2023-10-13 DIAGNOSIS — F32A Depression, unspecified: Secondary | ICD-10-CM | POA: Diagnosis not present

## 2023-10-13 DIAGNOSIS — Z638 Other specified problems related to primary support group: Secondary | ICD-10-CM

## 2023-10-13 NOTE — Progress Notes (Addendum)
 [name] [id number] [birth year] 48 y.o.  Virtual Visit via Telephone Note  I connected with pt on 10/13/23 at 10:30 AM EDT by telephone and verified that I am speaking with the correct person using two identifiers.   I discussed the limitations, risks, security and privacy concerns of performing an evaluation and management service by telephone and the availability of in person appointments. I also discussed with the patient that there may be a patient responsible charge related to this service. The patient expressed understanding and agreed to proceed.   I discussed the assessment and treatment plan with the patient. The patient was provided an opportunity to ask questions and all were answered. The patient agreed with the plan and demonstrated an understanding of the instructions.   The patient was advised to call back or seek an in-person evaluation if the symptoms worsen or if the condition fails to improve as anticipated.  I provided 20 minutes of non-face-to-face time during this encounter.  The patient was located at home.  The provider was located at [facility].   Redell DELENA Pizza, NP   Subjective:   Patient ID:  Kimberly Terrell is a 48 y.o. (DOB [birth year]) female.  Chief Complaint:  Chief Complaint  Patient presents with   Anxiety   Depression   Follow-up   Patient Education   Medication Refill   Irritability    HPI [name], 48 year old female presents to this office via telephonic interview for follow up and medication management. Collateral information should be considered reliable.  Patient is calm and pleasant. She and family are on their way for cruise. Says that Lamictal  has helped her irritability and mood swings but not quiet where she would like to be. Would like to increase this visit. Says that her anxiety today is 3/10, and depression is 3/10.  Says that she does get 7 to 8 hours of sleep with the aid of trazodone  and Rozerem .  She denies  history of mania, no psychosis, no auditory or visual hallucinations.  Denies SI or HI.   Past psychiatric medication trials: Xanax  BuSpar  Citalopram  Trazodone  Rozerem  Lexapro      Review of Systems:  Review of Systems  Constitutional: Negative.   Allergic/Immunologic: Negative.   Neurological: Negative.     Medications: I have reviewed the patient's current medications.  Current Outpatient Medications  Medication Sig Dispense Refill   acetaminophen  (TYLENOL ) 500 MG tablet Take 2 tablets (1,000 mg total) by mouth every 6 (six) hours as needed. 30 tablet 0   Acetaminophen -guaiFENesin  (MUCINEX  COLD & FLU) 325-200 MG CAPS Take 2 tablets by mouth daily as needed (cold symptoms).     Budeson-Glycopyrrol-Formoterol (BREZTRI  AEROSPHERE) 160-9-4.8 MCG/ACT AERO Inhale 2 puffs into the lungs in the morning and at bedtime. 1 each 11   busPIRone  (BUSPAR ) 10 MG tablet TAKE 2 TABLETS BY MOUTH 3 TIMES A DAY 540 tablet 1   busPIRone  (BUSPAR ) 30 MG tablet TAKE 1 TABLET BY MOUTH 2 TIMES DAILY. 180 tablet 0   citalopram  (CELEXA ) 40 MG tablet Take 1 tablet (40 mg total) by mouth daily. 30 tablet 2   desvenlafaxine  (PRISTIQ ) 100 MG 24 hr tablet Take 1 tablet (100 mg total) by mouth daily. 30 tablet 3   desvenlafaxine  (PRISTIQ ) 50 MG 24 hr tablet Take 1 tablet (50 mg total) by mouth daily. 30 tablet 1   diclofenac  sodium (VOLTAREN ) 1 % GEL Apply 2 g topically 4 (four) times daily. (Patient taking differently: Apply 2 g topically 4 (four) times  daily as needed (pain).) 300 g 1   gabapentin  (NEURONTIN ) 400 MG capsule TAKE 1 CAPSULE (400 MG TOTAL) BY MOUTH 4 (FOUR) TIMES DAILY. 120 capsule 2   hydrOXYzine  (ATARAX ) 10 MG tablet TAKE 1 TABLET BY MOUTH THREE TIMES A DAY AS NEEDED 270 tablet 1   lamoTRIgine  (LAMICTAL ) 25 MG tablet Take 2 tablets (50 mg) every day. 60 tablet 0   levalbuterol  (XOPENEX  HFA) 45 MCG/ACT inhaler Inhale 2 puffs into the lungs every 6 (six) hours as needed for wheezing or shortness of  breath. 1 each 12   levofloxacin  (LEVAQUIN ) 750 MG tablet Take 1 tablet (750 mg total) by mouth daily. 10 tablet 0   metoprolol  succinate (TOPROL -XL) 50 MG 24 hr tablet TAKE 1 TABLET BY MOUTH DAILY. TAKE WITH OR IMMEDIATELY FOLLOWING A MEAL. 30 tablet 0   pantoprazole  (PROTONIX ) 40 MG tablet TAKE 1 TABLET BY MOUTH EVERY DAY 90 tablet 1   polycarbophil (FIBERCON) 625 MG tablet Take 625 mg by mouth daily.     ramelteon  (ROZEREM ) 8 MG tablet Take 1 tablet (8 mg total) by mouth at bedtime as needed for sleep. Take 30 minutes before bedtime. 90 tablet 1   senna-docusate (SENOKOT-S) 8.6-50 MG tablet Take 1 tablet by mouth at bedtime as needed for mild constipation.     traZODone  (DESYREL ) 100 MG tablet Take 2 tablets (200 mg total) by mouth at bedtime. 180 tablet 1   No current facility-administered medications for this visit.    Medication Side Effects: None  Allergies:  Allergies  Allergen Reactions   Imitrex  [Sumatriptan ] Other (See Comments)    Gave me lockjaw   Tape Other (See Comments)    The plastic-like tape causes redness and irritation   Aspirin Other (See Comments)    Caused chest pain    Past Medical History:  Diagnosis Date   Anemia    Anxiety    Arthritis    Asthma    Bursitis    Right Hip and Right Knee   Depression    Dysrhythmia    H. pylori infection    s/p triple therapy x 2   Migraines     Family History  Problem Relation Age of Onset   Anxiety disorder Mother    Depression Mother    Depression Maternal Aunt    Anxiety disorder Maternal Aunt    Depression Paternal Uncle    Anxiety disorder Paternal Uncle    Depression Daughter    Colon cancer Neg Hx    Esophageal cancer Neg Hx    Gastric cancer Neg Hx    Inflammatory bowel disease Neg Hx    Liver disease Neg Hx    Pancreatic cancer Neg Hx    Rectal cancer Neg Hx    Stomach cancer Neg Hx     Social History   Socioeconomic History   Marital status: Married    Spouse name: Ellysa Parrack    Number of children: 1   Years of education: 9th   Highest education level: 9th grade  Occupational History   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, advertising  Tobacco Use   Smoking status: Former    Current packs/day: 0.00    Types: Cigarettes    Quit date: 12/03/2021    Years since quitting: 1.8   Smokeless tobacco: Never   Tobacco comments:    Trying   Vaping Use   Vaping status: Never Used  Substance and Sexual Activity   Alcohol use: No   Drug use: No  Sexual activity: Yes    Birth control/protection: Surgical    Comment: husband has had a vasectomy  Other Topics Concern   Not on file  Social History Narrative   Right handed    Lives with W[place] Dutt   Caffeine use: coffee-daily   Lives in [place], with husband, mother in Social worker, two grandchildren and one trans daughter   Social Drivers of Health   Financial Resource Strain: Low Risk  (04/16/2021)   Received from [facility]   Overall Financial Resource Strain (CARDIA)    Difficulty of Paying Living Expenses: Not hard at all  Food Insecurity: No Food Insecurity (04/29/2023)   Hunger Vital Sign    Worried About Running Out of Food in the Last Year: Never true    Ran Out of Food in the Last Year: Never true  Transportation Needs: No Transportation Needs (04/29/2023)   PRAPARE - Administrator, Civil Service (Medical): No    Lack of Transportation (Non-Medical): No  Physical Activity: Sufficiently Active (10/31/2020)   Received from [facility]   Exercise Vital Sign    On average, how many days per week do you engage in moderate to strenuous exercise (like a brisk walk)?: 5 days    On average, how many minutes do you engage in exercise at this level?: 30 min  Stress: Stress Concern Present (10/31/2020)   Received from [facility] - Occupational Stress Questionnaire    Feeling of Stress : To some extent  Social Connections: Moderately Integrated (10/31/2020)   Received from [facility]   Social Connection and Isolation Panel    In a typical week, how many times do you talk on the phone with family, friends, or neighbors?: Once a week    How often do you get together with friends or relatives?: Once a week    How often do you attend church or religious services?: 1 to 4 times per year    Do you belong to any clubs or organizations such as church groups, unions, fraternal or athletic groups, or school groups?: No    How often do you attend meetings of the clubs or organizations you belong to?: 1 to 4 times per year    Are you married, widowed, divorced, separated, never married, or living with a partner?: Married  Intimate Partner Violence: Not At Risk (04/29/2023)   Humiliation, Afraid, Rape, and Kick questionnaire    Fear of Current or Ex-Partner: No    Emotionally Abused: No    Physically Abused: No    Sexually Abused: No    Past Medical History, Surgical history, Social history, and Family history were reviewed and updated as appropriate.   Please see review of systems for further details on the patient's review from today.   Objective:   Physical Exam:  There were no vitals taken for this visit.  Physical Exam Neurological:     Mental Status: She is alert and oriented to person, place, and time.  Psychiatric:        Attention and Perception: Attention and perception normal.        Mood and Affect: Mood normal.        Speech: Speech normal.        Behavior: Behavior normal. Behavior is cooperative.        Cognition and Memory: Cognition and memory normal.        Judgment: Judgment normal.  Comments: Insight intact     Lab Review:     Component Value Date/Time   NA 135 02/23/2023 0714   NA 138 11/17/2022 1427   K 4.1 02/23/2023 0714   CL 104 02/23/2023 0714   CO2 21 (L) 02/23/2023 0714   GLUCOSE 96 02/23/2023 0714   BUN 9 02/23/2023 0714   BUN 10 11/17/2022 1427   CREATININE 0.84 02/23/2023 0714   CALCIUM 9.1 02/23/2023 0714   PROT  7.3 08/12/2022 1845   PROT 7.0 08/06/2022 1018   ALBUMIN  4.5 08/12/2022 1845   ALBUMIN  4.6 08/06/2022 1018   AST 15 08/12/2022 1845   ALT 11 08/12/2022 1845   ALKPHOS 74 08/12/2022 1845   BILITOT 0.3 08/12/2022 1845   BILITOT <0.2 08/06/2022 1018   GFRNONAA >60 02/23/2023 0714   GFRAA 117 04/09/2020 1113       Component Value Date/Time   WBC 7.0 09/16/2023 1040   RBC 4.73 09/16/2023 1040   HGB 12.0 09/16/2023 1040   HGB 11.3 01/27/2023 1150   HCT 39.8 09/16/2023 1040   HCT 36.2 01/27/2023 1150   PLT 300 09/16/2023 1040   PLT 237 01/27/2023 1150   MCV 84.1 09/16/2023 1040   MCV 81 01/27/2023 1150   MCH 25.4 (L) 09/16/2023 1040   MCHC 30.2 (L) 09/16/2023 1040   RDW 15.7 (H) 09/16/2023 1040   RDW 15.7 (H) 01/27/2023 1150   LYMPHSABS 2.1 01/04/2022 1022   MONOABS 0.8 12/03/2021 2200   EOSABS 259 09/16/2023 1040   EOSABS 0.5 (H) 01/04/2022 1022   BASOSABS 98 09/16/2023 1040   BASOSABS 0.1 01/04/2022 1022    No results found for: POCLITH, LITHIUM   No results found for: PHENYTOIN, PHENOBARB, VALPROATE, CBMZ   .res Assessment: Plan:   Greater than 50% of 20  min telephonic interview with patient was spent on counseling and coordination of care. We discussed her moderate improvement since starting Pristiq  and adding lamictal . She is open to increasing Lamictal  this visit  for negative moods. .   We agreed to today to: To increase Lamictal  to 100 mg daily  Monitor for any sign of rash. Please stop taking Lamictal  and contact office immediately rash develops. Recommend seeking urgent medical attention if rash is severe and/or spreading quickly.  Continue Pristiq  to 100 mg daily in the a.m. after breakfast Will continue hydroxyzine  10 mg 3 times daily as needed Continue  BuSpar  30 mg twice daily We will follow-up in 6 weeks to reassess To report worsening symptoms or side effects promptly Provided emergency contact information Reviewed PDMP           There are no diagnoses linked to this encounter.  Please see After Visit Summary for patient specific instructions.  Future Appointments  Date Time Provider Department Center  11/10/2023 10:00 AM Bernardo Bruckner, [facility] None  02/10/2024 11:30 AM [facility] 2 [facility]    No orders of the defined types were placed in this encounter.     -------------------------------

## 2023-10-24 ENCOUNTER — Telehealth: Payer: Self-pay | Admitting: Behavioral Health

## 2023-10-24 MED ORDER — LAMOTRIGINE 100 MG PO TABS: 100.0000 mg | ORAL_TABLET | Freq: Every day | ORAL | 1 refills | Status: DC

## 2023-10-24 NOTE — Telephone Encounter (Signed)
 Kimberly Terrell's next appt is 11/10/23. She is requesting refills on Pristiq  and Lamotrigine . She says she has 11 more days of Pristiq  and 4 days for Lamtrogine. Pharmacy is:   [id number] - [place],  - [address]   Phone: [phone]  Fax: [phone]

## 2023-10-24 NOTE — Telephone Encounter (Signed)
 Pt has RF of Pristiq . Lamictal  increased at last visit. Sent in new Rx. Notified patient. Reports getting text from pharmacy about RF while I was on the phone with her.

## 2023-10-27 ENCOUNTER — Other Ambulatory Visit: Payer: Self-pay | Admitting: Cardiovascular Disease

## 2023-10-27 ENCOUNTER — Ambulatory Visit: Admitting: Orthopedic Surgery

## 2023-11-07 ENCOUNTER — Ambulatory Visit (INDEPENDENT_AMBULATORY_CARE_PROVIDER_SITE_OTHER): Payer: Self-pay | Admitting: Student

## 2023-11-07 ENCOUNTER — Ambulatory Visit
Admission: RE | Admit: 2023-11-07 | Discharge: 2023-11-07 | Disposition: A | Discharge status: Home / Self Care | Source: Ambulatory Visit | Attending: Orthopedic Surgery | Admitting: Orthopedic Surgery

## 2023-11-07 VITALS — BP 114/65 | HR 64 | Temp 98.1°F | Ht 64.0 in | Wt 172.6 lb

## 2023-11-07 DIAGNOSIS — D509 Iron deficiency anemia, unspecified: Secondary | ICD-10-CM | POA: Diagnosis not present

## 2023-11-07 DIAGNOSIS — G8929 Other chronic pain: Secondary | ICD-10-CM

## 2023-11-07 DIAGNOSIS — M25511 Pain in right shoulder: Secondary | ICD-10-CM

## 2023-11-07 DIAGNOSIS — R1031 Right lower quadrant pain: Secondary | ICD-10-CM | POA: Diagnosis not present

## 2023-11-07 DIAGNOSIS — M7542 Impingement syndrome of left shoulder: Secondary | ICD-10-CM

## 2023-11-07 DIAGNOSIS — N912 Amenorrhea, unspecified: Secondary | ICD-10-CM | POA: Diagnosis not present

## 2023-11-07 DIAGNOSIS — R232 Flushing: Secondary | ICD-10-CM

## 2023-11-07 DIAGNOSIS — M25512 Pain in left shoulder: Secondary | ICD-10-CM

## 2023-11-07 DIAGNOSIS — Z8639 Personal history of other endocrine, nutritional and metabolic disease: Secondary | ICD-10-CM

## 2023-11-07 MED ORDER — IOPAMIDOL (ISOVUE-M 200) INJECTION 41%
10.0000 mL | Freq: Once | INTRAMUSCULAR | Status: AC
  Administered 2023-11-07 (×2): 10 mL via INTRA_ARTICULAR

## 2023-11-07 NOTE — Patient Instructions (Signed)
 Please call and/or go to ED if abdominal pain significantly worsens.

## 2023-11-07 NOTE — Assessment & Plan Note (Signed)
 Today she is inquiring about medications for menopause.  Last menstrual cycle was 5 months ago with occasional light spotting.  Her mother went into menopause early.  She is sexually active with her husband who has had a vasectomy.  No concerns for pregnancy from her standpoint.  She also endorses frequent hot flashes and mood swings.  Given above, she certainly seems perimenopausal but is okay with holding off on HRT until menstruation has ceased for at least 1 year.  If hot flashes worsen, can discuss possible HRT beforehand.

## 2023-11-07 NOTE — Assessment & Plan Note (Signed)
 Follows with orthopedics and has an MRI scheduled for today.

## 2023-11-07 NOTE — Assessment & Plan Note (Addendum)
 She has been experiencing right lower quadrant abdominal pain for the past week.  It is 5 out of 10 and constant.  Mild radiation to right groin area.  No dysuria/discharge.  No hematuria or previous history of kidney stones. No fevers/chills.  She does have a history of left-sided tubo-ovarian abscess (2021) and states that symptoms are somewhat similar to how that presented.  In review of that workup which required hospitalization, physical exam was rather unremarkable but with isolated leukocytosis.  Given rather indolent course from that scenario, will check CBC, UA, and I have also ordered a transvaginal ultrasound.  She was also seen in ED 08/2022 for for right-sided abdominal pain.  CTAP at that point was unremarkable.  She does not remember this ED visit but symptoms obviously resolved until returning recently. I discussed the potential use of empiric antibiotics and we agreed to defer for now but she will call if symptoms worsen or fail to improve in the meantime.  She also deferred bimanual exam as she has an MRI soon for shoulder pain. Return precautions given.

## 2023-11-07 NOTE — Progress Notes (Signed)
 Internal Medicine Clinic Attending  Case discussed with the resident at the time of the visit.  We reviewed the resident's history and exam and pertinent patient test results.  I agree with the assessment, diagnosis, and plan of care documented in the resident's note.

## 2023-11-07 NOTE — Assessment & Plan Note (Signed)
 As noted during previous OV's and lab work.  Outpatient iron infusions have previously been ordered but she has not started them.  Referral for iron infusion sent again today.  Check CBC.

## 2023-11-07 NOTE — Progress Notes (Signed)
 CC: Discuss medications for menopause. Right sided abdominal pain  HPI:  [name] is a 48 y.o. female living with a history stated below and presents today for follow-up. Please see problem based assessment and plan for additional details.  Past Medical History:  Diagnosis Date   Anemia    Anxiety    Arthritis    Asthma    Bursitis    Right Hip and Right Knee   Depression    Dysrhythmia    H. pylori infection    s/p triple therapy x 2   Migraines     Current Outpatient Medications on File Prior to Visit  Medication Sig Dispense Refill   lamoTRIgine  (LAMICTAL ) 100 MG tablet Take 1 tablet (100 mg total) by mouth daily. 30 tablet 1   acetaminophen  (TYLENOL ) 500 MG tablet Take 2 tablets (1,000 mg total) by mouth every 6 (six) hours as needed. 30 tablet 0   Acetaminophen -guaiFENesin  (MUCINEX  COLD & FLU) 325-200 MG CAPS Take 2 tablets by mouth daily as needed (cold symptoms).     Budeson-Glycopyrrol-Formoterol (BREZTRI  AEROSPHERE) 160-9-4.8 MCG/ACT AERO Inhale 2 puffs into the lungs in the morning and at bedtime. 1 each 11   busPIRone  (BUSPAR ) 30 MG tablet TAKE 1 TABLET BY MOUTH 2 TIMES DAILY. 180 tablet 0   citalopram  (CELEXA ) 40 MG tablet Take 1 tablet (40 mg total) by mouth daily. 30 tablet 2   desvenlafaxine  (PRISTIQ ) 100 MG 24 hr tablet Take 1 tablet (100 mg total) by mouth daily. 30 tablet 3   desvenlafaxine  (PRISTIQ ) 50 MG 24 hr tablet Take 1 tablet (50 mg total) by mouth daily. 30 tablet 1   diclofenac  sodium (VOLTAREN ) 1 % GEL Apply 2 g topically 4 (four) times daily. (Patient taking differently: Apply 2 g topically 4 (four) times daily as needed (pain).) 300 g 1   gabapentin  (NEURONTIN ) 400 MG capsule TAKE 1 CAPSULE (400 MG TOTAL) BY MOUTH 4 (FOUR) TIMES DAILY. 120 capsule 2   hydrOXYzine  (ATARAX ) 10 MG tablet TAKE 1 TABLET BY MOUTH THREE TIMES A DAY AS NEEDED 270 tablet 1   levalbuterol  (XOPENEX  HFA) 45 MCG/ACT inhaler Inhale 2 puffs into the lungs every 6 (six)  hours as needed for wheezing or shortness of breath. 1 each 12   metoprolol  succinate (TOPROL -XL) 50 MG 24 hr tablet TAKE 1 TABLET BY MOUTH EVERY DAY WITH OR IMMEDIATELY FOLLOWING A MEAL 15 tablet 0   pantoprazole  (PROTONIX ) 40 MG tablet TAKE 1 TABLET BY MOUTH EVERY DAY 90 tablet 1   polycarbophil (FIBERCON) 625 MG tablet Take 625 mg by mouth daily.     ramelteon  (ROZEREM ) 8 MG tablet Take 1 tablet (8 mg total) by mouth at bedtime as needed for sleep. Take 30 minutes before bedtime. 90 tablet 1   senna-docusate (SENOKOT-S) 8.6-50 MG tablet Take 1 tablet by mouth at bedtime as needed for mild constipation.     traZODone  (DESYREL ) 100 MG tablet Take 2 tablets (200 mg total) by mouth at bedtime. 180 tablet 1   No current facility-administered medications on file prior to visit.    Family History  Problem Relation Age of Onset   Anxiety disorder Mother    Depression Mother    Depression Maternal Aunt    Anxiety disorder Maternal Aunt    Depression Paternal Uncle    Anxiety disorder Paternal Uncle    Depression Daughter    Colon cancer Neg Hx    Esophageal cancer Neg Hx    Gastric cancer  Neg Hx    Inflammatory bowel disease Neg Hx    Liver disease Neg Hx    Pancreatic cancer Neg Hx    Rectal cancer Neg Hx    Stomach cancer Neg Hx     Social History   Socioeconomic History   Marital status: Married    Spouse name: Deloise Marchant   Number of children: 1   Years of education: 9th   Highest education level: 9th grade  Occupational History   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, advertising  Tobacco Use   Smoking status: Former    Current packs/day: 0.00    Types: Cigarettes    Quit date: 12/03/2021    Years since quitting: 1.9   Smokeless tobacco: Never   Tobacco comments:    Trying   Vaping Use   Vaping status: Never Used  Substance and Sexual Activity   Alcohol use: No   Drug use: No   Sexual activity: Yes    Birth control/protection: Surgical    Comment: husband has had a vasectomy  Other  Topics Concern   Not on file  Social History Narrative   Right handed    Lives with Placitas Epling   Caffeine use: coffee-daily   Lives in [place], with husband, mother in Social [place], two grandchildren and one trans daughter   Social Drivers of Health   Financial Resource Strain: Low Risk  (04/16/2021)   Received from [facility]   Overall Financial Resource Strain (CARDIA)    Difficulty of Paying Living Expenses: Not hard at all  Food Insecurity: No Food Insecurity (04/29/2023)   Hunger Vital Sign    Worried About Running Out of Food in the Last Year: Never true    Ran Out of Food in the Last Year: Never true  Transportation Needs: No Transportation Needs (04/29/2023)   PRAPARE - Administrator, Civil Service (Medical): No    Lack of Transportation (Non-Medical): No  Physical Activity: Sufficiently Active (10/31/2020)   Received from [facility]   Exercise Vital Sign    On average, how many days per week do you engage in moderate to strenuous exercise (like a brisk walk)?: 5 days    On average, how many minutes do you engage in exercise at this level?: 30 min  Stress: Stress Concern Present (10/31/2020)   Received from [facility] of Occupational Health - Occupational Stress Questionnaire    Feeling of Stress : To some extent  Social Connections: Moderately Integrated (10/31/2020)   Received from [facility]   Social Connection and Isolation Panel    In a typical week, how many times do you talk on the phone with family, friends, or neighbors?: Once a week    How often do you get together with friends or relatives?: Once a week    How often do you attend church or religious services?: 1 to 4 times per year    Do you belong to any clubs or organizations such as church groups, unions, fraternal or athletic groups, or school groups?: No    How often do you attend meetings of the clubs or organizations you belong to?: 1 to 4 times per year    Are you  married, widowed, divorced, separated, never married, or living with a partner?: Married  Intimate Partner Violence: Not At Risk (04/29/2023)   Humiliation, Afraid, Rape, and Kick questionnaire    Fear of Current or Ex-Partner: No    Emotionally Abused:  No    Physically Abused: No    Sexually Abused: No    Review of Systems: ROS negative except for what is noted on the assessment and plan.  Vitals:   11/07/23 1341  BP: 114/65  Pulse: 64  Temp: 98.1 F (36.7 C)  TempSrc: Oral  SpO2: 98%  Weight: 172 lb 9.6 oz (78.3 kg)  Height: 5' 4 (1.626 m)    Physical Exam: Constitutional: well-appearing, sitting in chair, in no acute distress Cardiovascular: regular rate and rhythm, no m/r/g Pulmonary/Chest: normal work of breathing on room air, lungs clear to auscultation bilaterally Abdominal: soft, mild TTP to RLQ, no guarding, no CVA tenderness Skin: warm and dry Psych: normal mood and behavior  Assessment & Plan:     Patient discussed with Dr. Shawn  Amenorrhea Today she is inquiring about medications for menopause.  Last menstrual cycle was 5 months ago with occasional light spotting.  Her mother went into menopause early.  She is sexually active with her husband who has had a vasectomy.  No concerns for pregnancy from her standpoint.  She also endorses frequent hot flashes and mood swings.  Given above, she certainly seems perimenopausal but is okay with holding off on HRT until menstruation has ceased for at least 1 year.  If hot flashes worsen, can discuss possible HRT beforehand.  Iron deficiency anemia, unspecified As noted during previous OV's and lab work.  Outpatient iron infusions have previously been ordered but she has not started them.  Referral for iron infusion sent again today.  Check CBC.  Abdominal pain, right lower quadrant She has been experiencing right lower quadrant abdominal pain for the past week.  It is 5 out of 10 and constant.  Mild radiation to right  groin area.  No dysuria/discharge.  No hematuria or previous history of kidney stones. No fevers/chills.  She does have a history of left-sided tubo-ovarian abscess (2021) and states that symptoms are somewhat similar to how that presented.  In review of that workup which required hospitalization, physical exam was rather unremarkable but with isolated leukocytosis.  Given rather indolent course from that scenario, will check CBC, UA, and I have also ordered a transvaginal ultrasound.  She was also seen in ED 08/2022 for for right-sided abdominal pain.  CTAP at that point was unremarkable.  She does not remember this ED visit but symptoms obviously resolved until returning recently. I discussed the potential use of empiric antibiotics and we agreed to defer for now but she will call if symptoms worsen or fail to improve in the meantime.  She also deferred bimanual exam as she has an MRI soon for shoulder pain.  Shoulder pain, bilateral Follows with orthopedics and has an MRI scheduled for today.   Norman Lobstein, D.O. [facility] Internal Medicine, PGY-2 Phone: [phone] Date 11/07/2023 Time 3:59 PM

## 2023-11-07 NOTE — Addendum Note (Signed)
 Addended by: TERESA ROGUE A on: 11/07/2023 08:22 AM   Modules accepted: Level of Service

## 2023-11-08 ENCOUNTER — Ambulatory Visit
Admission: RE | Admit: 2023-11-08 | Discharge: 2023-11-08 | Disposition: A | Discharge status: Home / Self Care | Source: Ambulatory Visit

## 2023-11-08 ENCOUNTER — Ambulatory Visit: Payer: Self-pay | Admitting: Student

## 2023-11-08 ENCOUNTER — Telehealth: Payer: Self-pay | Admitting: Family Medicine

## 2023-11-08 DIAGNOSIS — R1031 Right lower quadrant pain: Secondary | ICD-10-CM

## 2023-11-08 LAB — CBC
Hematocrit: 39.4 % (ref 34.0–46.6)
Hemoglobin: 12.3 g/dL (ref 11.1–15.9)
MCH: 26 pg — ABNORMAL LOW (ref 26.6–33.0)
MCHC: 31.2 g/dL — ABNORMAL LOW (ref 31.5–35.7)
MCV: 83 fL (ref 79–97)
Platelets: 251 x10E3/uL (ref 150–450)
RBC: 4.73 x10E6/uL (ref 3.77–5.28)
RDW: 15.3 % (ref 11.7–15.4)
WBC: 9.4 x10E3/uL (ref 3.4–10.8)

## 2023-11-08 LAB — URINALYSIS, ROUTINE W REFLEX MICROSCOPIC
Bilirubin, UA: NEGATIVE
Glucose, UA: NEGATIVE
Ketones, UA: NEGATIVE
Nitrite, UA: NEGATIVE
Protein,UA: NEGATIVE
RBC, UA: NEGATIVE
Specific Gravity, UA: 1.015 (ref 1.005–1.030)
Urobilinogen, Ur: 0.2 mg/dL (ref 0.2–1.0)
pH, UA: 6 (ref 5.0–7.5)

## 2023-11-08 LAB — MICROSCOPIC EXAMINATION
Bacteria, UA: NONE SEEN
Casts: NONE SEEN /LPF
RBC, Urine: NONE SEEN /HPF (ref 0–2)

## 2023-11-08 NOTE — Telephone Encounter (Addendum)
 Patient referred to infusion pharmacy team for ambulatory infusion of IV iron.  Insurance - Arts development officer of care - Site of care: [facility] Dx code - D50.9/Z8.36 IV Iron Therapy - Feraheme 510 mg IV x 2  Infusion appointments - Scheduling team will schedule patient as soon as possible.    Kimberly Terrell. Itzae Miralles, PharmD

## 2023-11-10 ENCOUNTER — Ambulatory Visit: Admitting: Behavioral Health

## 2023-11-10 ENCOUNTER — Telehealth: Payer: Self-pay

## 2023-11-10 ENCOUNTER — Ambulatory Visit: Admitting: Mental Health

## 2023-11-10 NOTE — Telephone Encounter (Signed)
 Dr. Shawn, patient will be scheduled as soon as possible.  Auth Submission: APPROVED Site of care: Site of care: [facility] Payer: [employer] Medication & CPT/J Code(s) submitted: Feraheme (ferumoxytol) [id number] Diagnosis Code:  Route of submission (phone, fax, portal): phone Phone # [phone] Fax # Auth type: Buy/Bill PB Units/visits requested: 510mg  x 2 doses Reference number: confirmed on the automated system Approval from: 11/10/23 to 03/11/24

## 2023-11-14 ENCOUNTER — Other Ambulatory Visit: Payer: Self-pay

## 2023-11-14 ENCOUNTER — Encounter: Payer: Self-pay | Admitting: Orthopedic Surgery

## 2023-11-14 ENCOUNTER — Ambulatory Visit (INDEPENDENT_AMBULATORY_CARE_PROVIDER_SITE_OTHER): Admitting: Orthopedic Surgery

## 2023-11-14 ENCOUNTER — Other Ambulatory Visit: Payer: Self-pay | Admitting: Cardiovascular Disease

## 2023-11-14 DIAGNOSIS — M65912 Unspecified synovitis and tenosynovitis, left shoulder: Secondary | ICD-10-CM | POA: Diagnosis not present

## 2023-11-14 DIAGNOSIS — M7542 Impingement syndrome of left shoulder: Secondary | ICD-10-CM

## 2023-11-15 ENCOUNTER — Other Ambulatory Visit: Payer: Self-pay

## 2023-11-15 ENCOUNTER — Encounter: Payer: Self-pay | Admitting: Orthopedic Surgery

## 2023-11-15 DIAGNOSIS — M069 Rheumatoid arthritis, unspecified: Secondary | ICD-10-CM

## 2023-11-15 MED ORDER — BUPIVACAINE HCL 0.5 % IJ SOLN
9.0000 mL | INTRAMUSCULAR | Status: AC | PRN
  Administered 2023-11-14: 9 mL via INTRA_ARTICULAR

## 2023-11-15 MED ORDER — TRIAMCINOLONE ACETONIDE 40 MG/ML IJ SUSP
40.0000 mg | INTRAMUSCULAR | Status: AC | PRN
  Administered 2023-11-14: 40 mg via INTRA_ARTICULAR

## 2023-11-15 MED ORDER — LIDOCAINE HCL 1 % IJ SOLN
5.0000 mL | INTRAMUSCULAR | Status: AC | PRN
  Administered 2023-11-14: 5 mL

## 2023-11-15 NOTE — Progress Notes (Signed)
 Office Visit Note   Patient: Kimberly Terrell           Date of Birth: 05-Mar-1976           MRN: [id number] Visit Date: 11/14/2023 Requested by: Elnora Ip, MD [address] [place],  [place] [address] PCP: Elnora Ip, MD  Subjective: Chief Complaint  Patient presents with   Left Shoulder - Follow-up    MRI review    HPI: Kimberly Terrell is a 48 y.o. female who presents to the office reporting left shoulder pain.  Since she was last seen she has had an MRI scan of the left shoulder.  That is reviewed with the patient.  It shows small amount of tendinosis of the supraspinatus.  No full-thickness component of the tear.  Labrum looks intact.  On my review of the exam the inferior axillary recess capsule does appear to be slightly thickened.  Patient cannot take anti-inflammatories due to perforated stomach ulcer in the past.  Hard for her to reach behind her back on the left-hand side.  Symptoms ongoing for more than a year..                ROS: All systems reviewed are negative as they relate to the chief complaint within the history of present illness.  Patient denies fevers or chills.  Assessment & Plan: Visit Diagnoses:  1. Impingement syndrome of left shoulder     Plan: Impression is left shoulder pain with positive values for possible rheumatoid arthritis.  Her ANA was negative but anti-CCP antibody and rheumatoid factor were positive.  Sed rate C-reactive protein also not significantly elevated.  Needs rheumatologic referral because she is having some other joint pains as well as some synovitis in metacarpal joints 2 and 3 on both hands.  Exercise sheets for range of motion of the left shoulder are provided because I think she may be getting an early frozen shoulder.  9 and 1 injection into the glenohumeral joint performed today.  She has had prior subacromial injection which did not give her much relief but I think for this problem adhesive capsulitis an  intra-articular injection plus exercises would be her best option.  Follow-up in 6 weeks for clinical recheck and consideration of another intra-articular glenoid injection at that time.  Follow-Up Instructions: No follow-ups on file.   Orders:  Orders Placed This Encounter  Procedures   US  Guided Needle Placement - No Linked Charges   No orders of the defined types were placed in this encounter.     Procedures: Large Joint Inj: L glenohumeral on 11/14/2023 10:29 AM Indications: diagnostic evaluation and pain Details: 22 G 3.5 in needle, ultrasound-guided posterior approach  Arthrogram: No  Medications: 9 mL bupivacaine  0.5 %; 5 mL lidocaine  1 %; 40 mg triamcinolone  acetonide 40 MG/ML Outcome: tolerated well, no immediate complications Procedure, treatment alternatives, risks and benefits explained, specific risks discussed. Consent was given by the patient. Immediately prior to procedure a time out was called to verify the correct patient, procedure, equipment, support staff and site/side marked as required. Patient was prepped and draped in the usual sterile fashion.       Clinical Data: No additional findings.  Objective: Vital Signs: There were no vitals taken for this visit.  Physical Exam:  Constitutional: Patient appears well-developed HEENT:  Head: Normocephalic Eyes:EOM are normal Neck: Normal range of motion Cardiovascular: Normal rate Pulmonary/chest: Effort normal Neurologic: Patient is alert Skin: Skin is warm Psychiatric: Patient has  normal mood and affect  Ortho Exam: Ortho exam demonstrates range of motion on that left-hand side of 50/85/140.  Rotator cuff strength is good infraspinatus supraspinatus and subscap muscle testing.  No masses lymphadenopathy or skin changes noted in the shoulder girdle region.  She can only really reach back to about the posterior superior iliac crest on the left and about T8 on the right.  No discrete AC joint  tenderness.  Specialty Comments:  No specialty comments available.  Imaging: No results found.   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 pylori infection 03/28/2019   Allergic rhinitis 07/25/2017   Depression with anxiety 04/04/2017   Migraine 04/04/2017   DUB (dysfunctional uterine bleeding) 04/04/2017   Past Medical History:  Diagnosis Date   Anemia    Anxiety    Arthritis    Asthma    Bursitis    Right Hip and Right Knee   Depression    Dysrhythmia    H. pylori infection    s/p triple therapy x 2   Migraines     Family History  Problem Relation Age of Onset   Anxiety disorder Mother    Depression Mother    Depression Maternal Aunt    Anxiety disorder Maternal Aunt    Depression Paternal Uncle    Anxiety disorder Paternal Uncle    Depression Daughter    Colon cancer Neg Hx    Esophageal cancer Neg Hx    Gastric cancer Neg Hx    Inflammatory bowel disease Neg Hx    Liver disease Neg Hx    Pancreatic  cancer Neg Hx    Rectal cancer Neg Hx    Stomach cancer Neg Hx     Past Surgical History:  Procedure Laterality Date   AUGMENTATION MAMMAPLASTY Bilateral    COSMETIC SURGERY  2008   breast implants    EXCISION MORTON'S NEUROMA Right 02/23/2023   Procedure: RIGHT FOOT EXCISION MASS 2ND WEB SPACE;  Surgeon: Harden Jerona GAILS, MD;  Location: [facility] OR;  Service: Orthopedics;  Laterality: Right;   gastric ulcer surgery     gastric ulcer perforation surgery   LAPAROTOMY N/A 12/04/2021   Procedure: EXPLORATORY LAPAROTOMY;  Surgeon: Vanderbilt Ned, MD;  Location: [facility] OR;  Service: General;  Laterality: N/A;   Social History   Occupational History   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, advertising  Tobacco Use   Smoking status: Former    Current packs/day: 0.00    Types: Cigarettes    Quit date: 12/03/2021    Years since quitting: 1.9   Smokeless tobacco:  Never   Tobacco comments:    Trying   Vaping Use   Vaping status: Never Used  Substance and Sexual Activity   Alcohol use: No   Drug use: No   Sexual activity: Yes    Birth control/protection: Surgical    Comment: husband has had a vasectomy

## 2023-11-17 ENCOUNTER — Other Ambulatory Visit: Payer: Self-pay

## 2023-11-22 ENCOUNTER — Encounter: Payer: Self-pay | Admitting: Behavioral Health

## 2023-11-22 ENCOUNTER — Telehealth (INDEPENDENT_AMBULATORY_CARE_PROVIDER_SITE_OTHER): Admitting: Behavioral Health

## 2023-11-22 DIAGNOSIS — F411 Generalized anxiety disorder: Secondary | ICD-10-CM | POA: Diagnosis not present

## 2023-11-22 DIAGNOSIS — F331 Major depressive disorder, recurrent, moderate: Secondary | ICD-10-CM | POA: Diagnosis not present

## 2023-11-22 DIAGNOSIS — Z638 Other specified problems related to primary support group: Secondary | ICD-10-CM

## 2023-11-22 DIAGNOSIS — G4700 Insomnia, unspecified: Secondary | ICD-10-CM | POA: Diagnosis not present

## 2023-11-22 DIAGNOSIS — F39 Unspecified mood [affective] disorder: Secondary | ICD-10-CM | POA: Diagnosis not present

## 2023-11-22 MED ORDER — LAMOTRIGINE 150 MG PO TABS: 150.0000 mg | ORAL_TABLET | Freq: Every day | ORAL | 3 refills | Status: DC

## 2023-11-22 MED ORDER — TRAZODONE HCL 100 MG PO TABS: 200.0000 mg | ORAL_TABLET | Freq: Every day | ORAL | 1 refills | Status: DC

## 2023-11-22 MED ORDER — BUSPIRONE HCL 30 MG PO TABS: 30.0000 mg | ORAL_TABLET | Freq: Two times a day (BID) | ORAL | 2 refills | Status: DC

## 2023-11-22 MED ORDER — QUETIAPINE FUMARATE 50 MG PO TABS: 50.0000 mg | ORAL_TABLET | Freq: Every day | ORAL | 1 refills | Status: DC

## 2023-11-22 MED ORDER — DESVENLAFAXINE SUCCINATE ER 50 MG PO TB24: 50.0000 mg | ORAL_TABLET | Freq: Every day | ORAL | 1 refills | Status: DC

## 2023-11-22 NOTE — Progress Notes (Signed)
 Kimberly Terrell [id number] [birth year] 48 y.o.  Virtual Visit via Video Note  I connected with pt @ on 11/22/23 at 10:00 AM EDT by a video enabled telemedicine application and verified that I am speaking with the correct person using two identifiers.   I discussed the limitations of evaluation and management by telemedicine and the availability of in person appointments. The patient expressed understanding and agreed to proceed.  I discussed the assessment and treatment plan with the patient. The patient was provided an opportunity to ask questions and all were answered. The patient agreed with the plan and demonstrated an understanding of the instructions.   The patient was advised to call back or seek an in-person evaluation if the symptoms worsen or if the condition fails to improve as anticipated.  I provided 20 minutes of non-face-to-face time during this encounter.  The patient was located at home.  The provider was located at [facility].   Redell DELENA Pizza, NP   Subjective:   Patient ID:  Kimberly Terrell. Pond is a 48 y.o. (DOB 13-Aug-1975) female.  Chief Complaint:  Chief Complaint  Patient presents with   Depression   Anxiety   Follow-up   Medication Refill   Patient Education   Medication Problem    HPI [name], 48 year old female presents to this office via video visit for follow up and medication management. Collateral information should be considered reliable.  Patient is calm and pleasant. Seeing some improvement with moods, primarily increased irritability, but no where she would like to be. Says anxiety and depression have significantly improved. Says that her anxiety today is 2/10, and depression is 2/10.  Says that she does get 7 to 8 hours of sleep with the aid of trazodone  and Rozerem .  She denies history of mania, no psychosis, no auditory or visual hallucinations.  Denies SI or HI.   Past psychiatric medication  trials: Xanax  BuSpar  Citalopram  Trazodone  Rozerem  Lexapro        Review of Systems:  Review of Systems  Constitutional: Negative.   Allergic/Immunologic: Negative.   Neurological: Negative.   Psychiatric/Behavioral:  Positive for dysphoric mood. The patient is nervous/anxious.     Medications: I have reviewed the patient's current medications.  Current Outpatient Medications  Medication Sig Dispense Refill   lamoTRIgine  (LAMICTAL ) 100 MG tablet Take 1 tablet (100 mg total) by mouth daily. 30 tablet 1   acetaminophen  (TYLENOL ) 500 MG tablet Take 2 tablets (1,000 mg total) by mouth every 6 (six) hours as needed. 30 tablet 0   Acetaminophen -guaiFENesin  (MUCINEX  COLD & FLU) 325-200 MG CAPS Take 2 tablets by mouth daily as needed (cold symptoms).     Budeson-Glycopyrrol-Formoterol (BREZTRI  AEROSPHERE) 160-9-4.8 MCG/ACT AERO Inhale 2 puffs into the lungs in the morning and at bedtime. 1 each 11   busPIRone  (BUSPAR ) 30 MG tablet TAKE 1 TABLET BY MOUTH 2 TIMES DAILY. 180 tablet 0   desvenlafaxine  (PRISTIQ ) 100 MG 24 hr tablet Take 1 tablet (100 mg total) by mouth daily. 30 tablet 3   desvenlafaxine  (PRISTIQ ) 50 MG 24 hr tablet Take 1 tablet (50 mg total) by mouth daily. 30 tablet 1   diclofenac  sodium (VOLTAREN ) 1 % GEL Apply 2 g topically 4 (four) times daily. (Patient taking differently: Apply 2 g topically 4 (four) times daily as needed (pain).) 300 g 1   gabapentin  (NEURONTIN ) 400 MG capsule TAKE 1 CAPSULE (400 MG TOTAL) BY MOUTH 4 (FOUR) TIMES DAILY. 120 capsule 2   hydrOXYzine  (ATARAX ) 10 MG tablet TAKE  1 TABLET BY MOUTH THREE TIMES A DAY AS NEEDED 270 tablet 1   levalbuterol  (XOPENEX  HFA) 45 MCG/ACT inhaler Inhale 2 puffs into the lungs every 6 (six) hours as needed for wheezing or shortness of breath. 1 each 12   metoprolol  succinate (TOPROL -XL) 50 MG 24 hr tablet TAKE 1 TABLET BY MOUTH EVERY DAY WITH OR IMMEDIATELY FOLLOWING A MEAL 15 tablet 0   pantoprazole  (PROTONIX ) 40 MG tablet  TAKE 1 TABLET BY MOUTH EVERY DAY 90 tablet 1   polycarbophil (FIBERCON) 625 MG tablet Take 625 mg by mouth daily.     ramelteon  (ROZEREM ) 8 MG tablet Take 1 tablet (8 mg total) by mouth at bedtime as needed for sleep. Take 30 minutes before bedtime. 90 tablet 1   senna-docusate (SENOKOT-S) 8.6-50 MG tablet Take 1 tablet by mouth at bedtime as needed for mild constipation.     traZODone  (DESYREL ) 100 MG tablet Take 2 tablets (200 mg total) by mouth at bedtime. 180 tablet 1   No current facility-administered medications for this visit.    Medication Side Effects: None  Allergies:  Allergies  Allergen Reactions   Imitrex  [Sumatriptan ] Other (See Comments)    Gave me lockjaw   Tape Other (See Comments)    The plastic-like tape causes redness and irritation   Aspirin Other (See Comments)    Caused chest pain    Past Medical History:  Diagnosis Date   Anemia    Anxiety    Arthritis    Asthma    Bursitis    Right Hip and Right Knee   Depression    Dysrhythmia    H. pylori infection    s/p triple therapy x 2   Migraines     Family History  Problem Relation Age of Onset   Anxiety disorder Mother    Depression Mother    Depression Maternal Aunt    Anxiety disorder Maternal Aunt    Depression Paternal Uncle    Anxiety disorder Paternal Uncle    Depression Daughter    Colon cancer Neg Hx    Esophageal cancer Neg Hx    Gastric cancer Neg Hx    Inflammatory bowel disease Neg Hx    Liver disease Neg Hx    Pancreatic cancer Neg Hx    Rectal cancer Neg Hx    Stomach cancer Neg Hx     Social History   Socioeconomic History   Marital status: Married    Spouse name: Deon Ivey   Number of children: 1   Years of education: 9th   Highest education level: 9th grade  Occupational History   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, advertising  Tobacco Use   Smoking status: Former    Current packs/day: 0.00    Types: Cigarettes    Quit date: 12/03/2021    Years since quitting: 1.9   Smokeless  tobacco: Never   Tobacco comments:    Trying   Vaping Use   Vaping status: Never Used  Substance and Sexual Activity   Alcohol use: No   Drug use: No   Sexual activity: Yes    Birth control/protection: Surgical    Comment: husband has had a vasectomy  Other Topics Concern   Not on file  Social History Narrative   Right handed    Lives with [place]   Caffeine use: coffee-daily   Lives in [place], with husband, mother in [place], two grandchildren and one trans daughter   Social Drivers of Dispensing optician  Resource Strain: Low Risk  (04/16/2021)   Received [facility]   Overall Financial Resource Strain (CARDIA)    Difficulty of Paying Living Expenses: Not hard at all  Food Insecurity: No Food Insecurity (04/29/2023)   Hunger Vital Sign    Worried About Running Out of Food in the Last Year: Never true    Ran Out of Food in the Last Year: Never true  Transportation Needs: No Transportation Needs (04/29/2023)   PRAPARE - Administrator, Civil Service (Medical): No    Lack of Transportation (Non-Medical): No  Physical Activity: Sufficiently Active (10/31/2020)   Received from [facility]   Exercise Vital Sign    On average, how many days per week do you engage in moderate to strenuous exercise (like a brisk walk)?: 5 days    On average, how many minutes do you engage in exercise at this level?: 30 min  Stress: Stress Concern Present (10/31/2020)   Received from [facility] Occupational [facility] - Occupational Stress Questionnaire    Feeling of Stress : To some extent  Social Connections: Moderately Integrated (10/31/2020)   Received from [facility]   Social Connection and Isolation Panel    In a typical week, how many times do you talk on the phone with family, friends, or neighbors?: Once a week    How often do you get together with friends or relatives?: Once a week    How often do you attend church or religious services?: 1  to 4 times per year    Do you belong to any clubs or organizations such as church groups, unions, fraternal or athletic groups, or school groups?: No    How often do you attend meetings of the clubs or organizations you belong to?: 1 to 4 times per year    Are you married, widowed, divorced, separated, never married, or living with a partner?: Married  Intimate Partner Violence: Not At Risk (04/29/2023)   Humiliation, Afraid, Rape, and Kick questionnaire    Fear of Current or Ex-Partner: No    Emotionally Abused: No    Physically Abused: No    Sexually Abused: No    Past Medical History, Surgical history, Social history, and Family history were reviewed and updated as appropriate.   Please see review of systems for further details on the patient's review from today.   Objective:   Physical Exam:  There were no vitals taken for this visit.  Physical Exam Neurological:     Mental Status: She is alert and oriented to person, place, and time.  Psychiatric:        Attention and Perception: Attention and perception normal.        Mood and Affect: Mood normal.        Speech: Speech normal.        Behavior: Behavior normal. Behavior is cooperative.        Cognition and Memory: Cognition and memory normal.        Judgment: Judgment normal.     Comments: Insight intact     Lab Review:     Component Value Date/Time   NA 135 02/23/2023 0714   NA 138 11/17/2022 1427   K 4.1 02/23/2023 0714   CL 104 02/23/2023 0714   CO2 21 (L) 02/23/2023 0714   GLUCOSE 96 02/23/2023 0714   BUN 9 02/23/2023 0714   BUN 10 11/17/2022 1427   CREATININE 0.84 02/23/2023  9285   CALCIUM 9.1 02/23/2023 0714   PROT 7.3 08/12/2022 1845   PROT 7.0 08/06/2022 1018   ALBUMIN  4.5 08/12/2022 1845   ALBUMIN  4.6 08/06/2022 1018   AST 15 08/12/2022 1845   ALT 11 08/12/2022 1845   ALKPHOS 74 08/12/2022 1845   BILITOT 0.3 08/12/2022 1845   BILITOT <0.2 08/06/2022 1018   GFRNONAA >60 02/23/2023 0714   GFRAA 117  04/09/2020 1113       Component Value Date/Time   WBC 9.4 11/07/2023 1459   WBC 7.0 09/16/2023 1040   RBC 4.73 11/07/2023 1459   RBC 4.73 09/16/2023 1040   HGB 12.3 11/07/2023 1459   HCT 39.4 11/07/2023 1459   PLT 251 11/07/2023 1459   MCV 83 11/07/2023 1459   MCH 26.0 (L) 11/07/2023 1459   MCH 25.4 (L) 09/16/2023 1040   MCHC 31.2 (L) 11/07/2023 1459   MCHC 30.2 (L) 09/16/2023 1040   RDW 15.3 11/07/2023 1459   LYMPHSABS 2.1 01/04/2022 1022   MONOABS 0.8 12/03/2021 2200   EOSABS 259 09/16/2023 1040   EOSABS 0.5 (H) 01/04/2022 1022   BASOSABS 98 09/16/2023 1040   BASOSABS 0.1 01/04/2022 1022    No results found for: POCLITH, LITHIUM   No results found for: PHENYTOIN, PHENOBARB, VALPROATE, CBMZ   .res Assessment: Plan:   Greater than 50% of 30 min video interview with patient was spent on counseling and coordination of care. We discussed her moderate improvement since starting Pristiq  and adding lamictal . She is open to increasing Lamictal  this visit  for negative moods. .   We agreed to today to: To increase Lamictal  to 150  mg daily  Monitor for any sign of rash. Please stop taking Lamictal  and contact office immediately rash develops. Recommend seeking urgent medical attention if rash is severe and/or spreading quickly.  Continue Pristiq  to 100 mg daily in the a.m. after breakfast Will continue hydroxyzine  10 mg 3 times daily as needed Continue  BuSpar  30 mg twice daily We will follow-up in 6 weeks to reassess To report worsening symptoms or side effects promptly Provided emergency contact information Reviewed PDMP           Venecia was seen today for depression, anxiety, follow-up, medication refill, patient education and medication problem.  Diagnoses and all orders for this visit:  Major depressive disorder, recurrent episode, moderate (HCC)  Unspecified mood (affective) disorder (HCC)  Generalized anxiety disorder  Caregiver role  strain  Insomnia, unspecified type     Please see After Visit Summary for patient specific instructions.  Future Appointments  Date Time Provider Department Center  12/30/2023 10:15 AM Addie Cordella Hamilton, MD [facility] None  02/10/2024 11:30 AM [facility] 2 [facility]  04/19/2024  9:20 AM Rice, Lonni ORN, MD [facility] None    No orders of the defined types were placed in this encounter.     -------------------------------

## 2023-11-27 ENCOUNTER — Other Ambulatory Visit: Payer: Self-pay | Admitting: Cardiovascular Disease

## 2023-12-03 ENCOUNTER — Other Ambulatory Visit: Payer: Self-pay | Admitting: Gastroenterology

## 2023-12-03 DIAGNOSIS — K255 Chronic or unspecified gastric ulcer with perforation: Secondary | ICD-10-CM

## 2023-12-14 ENCOUNTER — Other Ambulatory Visit: Payer: Self-pay | Admitting: Behavioral Health

## 2023-12-14 DIAGNOSIS — G4700 Insomnia, unspecified: Secondary | ICD-10-CM

## 2023-12-14 DIAGNOSIS — F331 Major depressive disorder, recurrent, moderate: Secondary | ICD-10-CM

## 2023-12-14 DIAGNOSIS — F411 Generalized anxiety disorder: Secondary | ICD-10-CM

## 2023-12-22 ENCOUNTER — Other Ambulatory Visit: Payer: Self-pay | Admitting: Cardiovascular Disease

## 2023-12-30 ENCOUNTER — Ambulatory Visit: Admitting: Orthopedic Surgery

## 2023-12-30 ENCOUNTER — Encounter: Payer: Self-pay | Admitting: Orthopedic Surgery

## 2023-12-30 DIAGNOSIS — M65912 Unspecified synovitis and tenosynovitis, left shoulder: Secondary | ICD-10-CM | POA: Diagnosis not present

## 2023-12-31 ENCOUNTER — Other Ambulatory Visit: Payer: Self-pay | Admitting: Orthopedic Surgery

## 2023-12-31 ENCOUNTER — Other Ambulatory Visit: Payer: Self-pay | Admitting: Behavioral Health

## 2023-12-31 DIAGNOSIS — F39 Unspecified mood [affective] disorder: Secondary | ICD-10-CM

## 2023-12-31 DIAGNOSIS — F331 Major depressive disorder, recurrent, moderate: Secondary | ICD-10-CM

## 2023-12-31 DIAGNOSIS — F411 Generalized anxiety disorder: Secondary | ICD-10-CM

## 2023-12-31 DIAGNOSIS — G5761 Lesion of plantar nerve, right lower limb: Secondary | ICD-10-CM

## 2023-12-31 DIAGNOSIS — G4700 Insomnia, unspecified: Secondary | ICD-10-CM

## 2024-01-01 ENCOUNTER — Encounter: Payer: Self-pay | Admitting: Orthopedic Surgery

## 2024-01-01 NOTE — Progress Notes (Signed)
 Office Visit Note   Patient: Kimberly Terrell           Date of Birth: 1975-04-21           MRN: [id number] Visit Date: 12/30/2023 Requested by: Elnora Ip, MD [address] [place],  [place] [address] PCP: Elnora Ip, MD  Subjective: Chief Complaint  Patient presents with   Left Shoulder - Follow-up    HPI: Kimberly Terrell is a 48 y.o. female who presents to the office reporting left shoulder pain.  She was last seen 6 weeks ago.  Glenohumeral joint injection performed at that time.  Decision point today was for against MRI scan.  Possible frozen shoulder last clinic visit.  Overall she is doing well.  Using CBD Gummies which has helped her significantly.  Rheumatology visit is not until January.  She is using CBD Gummies called mellow fellow to help her symptoms and they have helped her significantly..                ROS: All systems reviewed are negative as they relate to the chief complaint within the history of present illness.  Patient denies fevers or chills.  Assessment & Plan: Visit Diagnoses:  1. Synovitis of left shoulder     Plan: Impression is improvement in left shoulder range of motion following glenohumeral joint injection.  No indication for further imaging or injection at this time.  She will continue to work on stretching of the shoulder and follow-up with us  as needed.  Follow-Up Instructions: No follow-ups on file.   Orders:  No orders of the defined types were placed in this encounter.  No orders of the defined types were placed in this encounter.     Procedures: No procedures performed   Clinical Data: No additional findings.  Objective: Vital Signs: There were no vitals taken for this visit.  Physical Exam:  Constitutional: Patient appears well-developed HEENT:  Head: Normocephalic Eyes:EOM are normal Neck: Normal range of motion Cardiovascular: Normal rate Pulmonary/chest: Effort normal Neurologic: Patient is  alert Skin: Skin is warm Psychiatric: Patient has normal mood and affect  Ortho Exam: Ortho exam demonstrates range of motion on the left of 85/100/170.  This is symmetric with the right-hand side.  Rotator cuff strength is excellent infraspinatus supraspinatus and subscap muscle testing on the left-hand side with no AC joint tenderness good cervical spine range of motion and motor sensory function intact to the left arm.  Specialty Comments:  No specialty comments available.  Imaging: No results found.   PMFS History: Patient Active Problem List   Diagnosis Date Noted   Amenorrhea 11/07/2023   Hot flashes 11/07/2023   Abdominal pain, right lower quadrant 11/07/2023   Iron deficiency anemia, unspecified 09/09/2023   Productive cough 02/03/2023   Cervical cancer screening 01/28/2023   Right knee pain 01/28/2023   History of iron deficiency 12/24/2022   PVC (premature ventricular contraction) 11/17/2022   Leg cramping 11/17/2022   Colon polyps 08/09/2022   Health care maintenance 04/06/2022   Hemorrhoids 04/06/2022   Insomnia 03/09/2022   Pruritic dermatitis 02/04/2022   Heart failure with recovered ejection fraction (HFrecEF) (HCC) 01/04/2022   Perforated viscus 12/04/2021   Gastric ulcer 12/04/2021   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 pylori infection 03/28/2019   Allergic rhinitis 07/25/2017   Depression with anxiety 04/04/2017   Migraine 04/04/2017   DUB (dysfunctional uterine bleeding) 04/04/2017   Past Medical History:  Diagnosis Date   Anemia    Anxiety    Arthritis    Asthma    Bursitis    Right Hip and Right Knee   Depression    Dysrhythmia    H. pylori infection    s/p triple therapy x 2    Migraines     Family History  Problem Relation Age of Onset   Anxiety disorder Mother    Depression Mother    Depression Maternal Aunt    Anxiety disorder Maternal Aunt    Depression Paternal Uncle    Anxiety disorder Paternal Uncle    Depression Daughter    Colon cancer Neg Hx    Esophageal cancer Neg Hx    Gastric cancer Neg Hx    Inflammatory bowel disease Neg Hx    Liver disease Neg Hx    Pancreatic cancer Neg Hx    Rectal cancer Neg Hx    Stomach cancer Neg Hx     Past Surgical History:  Procedure Laterality Date   AUGMENTATION MAMMAPLASTY Bilateral    COSMETIC SURGERY  2008   breast implants    EXCISION MORTON'S NEUROMA Right 02/23/2023   Procedure: RIGHT FOOT EXCISION MASS 2ND WEB SPACE;  Surgeon: Harden Jerona GAILS, MD;  Location: [facility] OR;  Service: Orthopedics;  Laterality: Right;   gastric ulcer surgery     gastric ulcer perforation surgery   LAPAROTOMY N/A 12/04/2021   Procedure: EXPLORATORY LAPAROTOMY;  Surgeon: Vanderbilt Ned, MD;  Location: [facility] OR;  Service: General;  Laterality: N/A;   Social History   Occupational History   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, advertising  Tobacco Use   Smoking status: Former    Current packs/day: 0.00    Types: Cigarettes    Quit date: 12/03/2021    Years since quitting: 2.0   Smokeless tobacco: Never   Tobacco comments:    Trying   Vaping Use   Vaping status: Never Used  Substance and Sexual Activity   Alcohol use: No   Drug use: No   Sexual activity: Yes    Birth control/protection: Surgical    Comment: husband has had a vasectomy

## 2024-01-02 NOTE — Telephone Encounter (Signed)
 Verify Pristiq  dose.

## 2024-01-05 ENCOUNTER — Telehealth (INDEPENDENT_AMBULATORY_CARE_PROVIDER_SITE_OTHER): Admitting: Behavioral Health

## 2024-01-05 ENCOUNTER — Encounter: Payer: Self-pay | Admitting: Behavioral Health

## 2024-01-05 DIAGNOSIS — Z638 Other specified problems related to primary support group: Secondary | ICD-10-CM

## 2024-01-05 DIAGNOSIS — F411 Generalized anxiety disorder: Secondary | ICD-10-CM | POA: Diagnosis not present

## 2024-01-05 DIAGNOSIS — F331 Major depressive disorder, recurrent, moderate: Secondary | ICD-10-CM

## 2024-01-05 DIAGNOSIS — G4700 Insomnia, unspecified: Secondary | ICD-10-CM

## 2024-01-05 DIAGNOSIS — F39 Unspecified mood [affective] disorder: Secondary | ICD-10-CM | POA: Diagnosis not present

## 2024-01-05 DIAGNOSIS — Z6379 Other stressful life events affecting family and household: Secondary | ICD-10-CM

## 2024-01-05 MED ORDER — LAMOTRIGINE 150 MG PO TABS: 150.0000 mg | ORAL_TABLET | Freq: Every day | ORAL | 3 refills | Status: DC

## 2024-01-05 MED ORDER — DESVENLAFAXINE SUCCINATE ER 100 MG PO TB24: 100.0000 mg | ORAL_TABLET | Freq: Every day | ORAL | 3 refills | Status: AC

## 2024-01-05 MED ORDER — QUETIAPINE FUMARATE 100 MG PO TABS: 100.0000 mg | ORAL_TABLET | Freq: Every day | ORAL | 1 refills | Status: DC

## 2024-01-05 MED ORDER — BUSPIRONE HCL 30 MG PO TABS: 30.0000 mg | ORAL_TABLET | Freq: Two times a day (BID) | ORAL | 2 refills | Status: AC

## 2024-01-05 NOTE — Progress Notes (Signed)
 Kimberly Terrell [id number] 1975/05/06 48 y.o.  Virtual Visit via Video Note  I connected with pt @ on 01/05/24 at 10:00 AM EDT by a video enabled telemedicine application and verified that I am speaking with the correct person using two identifiers.   I discussed the limitations of evaluation and management by telemedicine and the availability of in person appointments. The patient expressed understanding and agreed to proceed.  I discussed the assessment and treatment plan with the patient. The patient was provided an opportunity to ask questions and all were answered. The patient agreed with the plan and demonstrated an understanding of the instructions.   The patient was advised to call back or seek an in-person evaluation if the symptoms worsen or if the condition fails to improve as anticipated.  I provided 30 minutes of non-face-to-face time during this encounter.  The patient was located at home.  The provider was located at [facility].   Kimberly DELENA Pizza, NP   Subjective:   Patient ID:  Kimberly Terrell is a 48 y.o. (DOB Aug 10, 1975) female.  Chief Complaint:  Chief Complaint  Patient presents with   Anxiety   Depression   Follow-up   Medication Refill   Patient Education   Stress   Family Problem   Insomnia   Care Giver Role Strain    HPI Kimberly Terrell, 48 year old female presents to this office via video visit for follow up and medication management. Collateral information should be considered reliable.  Patient is calm but somewhat flat. Complains  Seeing some improvement with moods,anxiety and depression. Has some intermittent irritability. She is complaining of continued poor sleep quality today. Says Seroquel  has been partially effective so far.    Says that her anxiety today is 2/10, and depression is 2/10.    She denies history of mania, no psychosis, no auditory or visual hallucinations.  Denies SI or HI.   Past psychiatric medication  trials: Xanax  BuSpar  Citalopram  Trazodone  Rozerem  Lexapro      Review of Systems:  Review of Systems  Constitutional: Negative.   Allergic/Immunologic: Negative.   Psychiatric/Behavioral:  Positive for dysphoric mood and sleep disturbance. The patient is nervous/anxious.     Medications: I have reviewed the patient's current medications.  Current Outpatient Medications  Medication Sig Dispense Refill   desvenlafaxine  (PRISTIQ ) 100 MG 24 hr tablet TAKE 1 TABLET BY MOUTH EVERY DAY 30 tablet 0   lamoTRIgine  (LAMICTAL ) 100 MG tablet Take 1 tablet (100 mg total) by mouth daily. 30 tablet 1   QUEtiapine  (SEROQUEL ) 50 MG tablet TAKE 1 TABLET BY MOUTH EVERYDAY AT BEDTIME 30 tablet 0   acetaminophen  (TYLENOL ) 500 MG tablet Take 2 tablets (1,000 mg total) by mouth every 6 (six) hours as needed. 30 tablet 0   Acetaminophen -guaiFENesin  (MUCINEX  COLD & FLU) 325-200 MG CAPS Take 2 tablets by mouth daily as needed (cold symptoms).     Budeson-Glycopyrrol-Formoterol (BREZTRI  AEROSPHERE) 160-9-4.8 MCG/ACT AERO Inhale 2 puffs into the lungs in the morning and at bedtime. 1 each 11   busPIRone  (BUSPAR ) 30 MG tablet Take 1 tablet (30 mg total) by mouth 2 (two) times daily. 180 tablet 2   diclofenac  sodium (VOLTAREN ) 1 % GEL Apply 2 g topically 4 (four) times daily. (Patient taking differently: Apply 2 g topically 4 (four) times daily as needed (pain).) 300 g 1   gabapentin  (NEURONTIN ) 400 MG capsule TAKE 1 CAPSULE (400 MG TOTAL) BY MOUTH 4 (FOUR) TIMES DAILY. 120 capsule 2   hydrOXYzine  (ATARAX ) 10 MG  tablet TAKE 1 TABLET BY MOUTH THREE TIMES A DAY AS NEEDED 270 tablet 1   lamoTRIgine  (LAMICTAL ) 150 MG tablet Take 1 tablet (150 mg total) by mouth daily. 30 tablet 3   levalbuterol  (XOPENEX  HFA) 45 MCG/ACT inhaler Inhale 2 puffs into the lungs every 6 (six) hours as needed for wheezing or shortness of breath. 1 each 12   metoprolol  succinate (TOPROL -XL) 50 MG 24 hr tablet TAKE 1 TABLET BY MOUTH EVERY DAY  WITH OR IMMEDIATELY FOLLOWING A MEAL 90 tablet 3   pantoprazole  (PROTONIX ) 40 MG tablet TAKE 1 TABLET BY MOUTH EVERY DAY 90 tablet 1   polycarbophil (FIBERCON) 625 MG tablet Take 625 mg by mouth daily.     ramelteon  (ROZEREM ) 8 MG tablet Take 1 tablet (8 mg total) by mouth at bedtime as needed for sleep. Take 30 minutes before bedtime. 90 tablet 1   senna-docusate (SENOKOT-S) 8.6-50 MG tablet Take 1 tablet by mouth at bedtime as needed for mild constipation.     traZODone  (DESYREL ) 100 MG tablet Take 2 tablets (200 mg total) by mouth at bedtime. 180 tablet 1   No current facility-administered medications for this visit.    Medication Side Effects: None  Allergies:  Allergies  Allergen Reactions   Imitrex  [Sumatriptan ] Other (See Comments)    Gave me lockjaw   Tape Other (See Comments)    The plastic-like tape causes redness and irritation   Aspirin Other (See Comments)    Caused chest pain    Past Medical History:  Diagnosis Date   Anemia    Anxiety    Arthritis    Asthma    Bursitis    Right Hip and Right Knee   Depression    Dysrhythmia    H. pylori infection    s/p triple therapy x 2   Migraines     Family History  Problem Relation Age of Onset   Anxiety disorder Mother    Depression Mother    Depression Maternal Aunt    Anxiety disorder Maternal Aunt    Depression Paternal Uncle    Anxiety disorder Paternal Uncle    Depression Daughter    Colon cancer Neg Hx    Esophageal cancer Neg Hx    Gastric cancer Neg Hx    Inflammatory bowel disease Neg Hx    Liver disease Neg Hx    Pancreatic cancer Neg Hx    Rectal cancer Neg Hx    Stomach cancer Neg Hx     Social History   Socioeconomic History   Marital status: Married    Spouse name: Jalaiya Oyster   Number of children: 1   Years of education: 9th   Highest education level: 9th grade  Occupational History   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, advertising  Tobacco Use   Smoking status: Former    Current packs/day: 0.00     Types: Cigarettes    Quit date: 12/03/2021    Years since quitting: 2.0   Smokeless tobacco: Never   Tobacco comments:    Trying   Vaping Use   Vaping status: Never Used  Substance and Sexual Activity   Alcohol use: No   Drug use: No   Sexual activity: Yes    Birth control/protection: Surgical    Comment: husband has had a vasectomy  Other Topics Concern   Not on file  Social History Narrative   Right handed    Lives with [place]   Caffeine use: coffee-daily   Lives in  [place], with husband, mother in Social worker, two grandchildren and one trans daughter   Social Drivers of Health   Financial Resource Strain: Low Risk  (04/16/2021)   Received [facility]   Overall Financial Resource Strain (CARDIA)    Difficulty of Paying Living Expenses: Not hard at all  Food Insecurity: No Food Insecurity (04/29/2023)   Hunger Vital Sign    Worried About Running Out of Food in the Last Year: Never true    Ran Out of Food in the Last Year: Never true  Transportation Needs: No Transportation Needs (04/29/2023)   PRAPARE - Administrator, Civil Service (Medical): No    Lack of Transportation (Non-Medical): No  Physical Activity: Sufficiently Active (10/31/2020)   Received from [facility]   Exercise Vital Sign    On average, how many days per week do you engage in moderate to strenuous exercise (like a brisk walk)?: 5 days    On average, how many minutes do you engage in exercise at this level?: 30 min  Stress: Stress Concern Present (10/31/2020)   Received from [facility] Occupational [facility] - Occupational Stress Questionnaire    Feeling of Stress : To some extent  Social Connections: Moderately Integrated (10/31/2020)   Received from [facility]   Social Connection and Isolation Panel    In a typical week, how many times do you talk on the phone with family, friends, or neighbors?: Once a week    How often do you get together with  friends or relatives?: Once a week    How often do you attend church or religious services?: 1 to 4 times per year    Do you belong to any clubs or organizations such as church groups, unions, fraternal or athletic groups, or school groups?: No    How often do you attend meetings of the clubs or organizations you belong to?: 1 to 4 times per year    Are you married, widowed, divorced, separated, never married, or living with a partner?: Married  Intimate Partner Violence: Not At Risk (04/29/2023)   Humiliation, Afraid, Rape, and Kick questionnaire    Fear of Current or Ex-Partner: No    Emotionally Abused: No    Physically Abused: No    Sexually Abused: No    Past Medical History, Surgical history, Social history, and Family history were reviewed and updated as appropriate.   Please see review of systems for further details on the patient's review from today.   Objective:   Physical Exam:  There were no vitals taken for this visit.  Physical Exam Neurological:     Mental Status: She is alert and oriented to person, place, and time.  Psychiatric:        Attention and Perception: Attention and perception normal.        Mood and Affect: Mood normal.        Speech: Speech normal.        Behavior: Behavior normal. Behavior is cooperative.        Cognition and Memory: Cognition and memory normal.        Judgment: Judgment normal.     Comments: Insight intact     Lab Review:     Component Value Date/Time   NA 135 02/23/2023 0714   NA 138 11/17/2022 1427   K 4.1 02/23/2023 0714   CL 104 02/23/2023 0714   CO2 21 (L) 02/23/2023 9285  GLUCOSE 96 02/23/2023 0714   BUN 9 02/23/2023 0714   BUN 10 11/17/2022 1427   CREATININE 0.84 02/23/2023 0714   CALCIUM 9.1 02/23/2023 0714   PROT 7.3 08/12/2022 1845   PROT 7.0 08/06/2022 1018   ALBUMIN  4.5 08/12/2022 1845   ALBUMIN  4.6 08/06/2022 1018   AST 15 08/12/2022 1845   ALT 11 08/12/2022 1845   ALKPHOS 74 08/12/2022 1845   BILITOT  0.3 08/12/2022 1845   BILITOT <0.2 08/06/2022 1018   GFRNONAA >60 02/23/2023 0714   GFRAA 117 04/09/2020 1113       Component Value Date/Time   WBC 9.4 11/07/2023 1459   WBC 7.0 09/16/2023 1040   RBC 4.73 11/07/2023 1459   RBC 4.73 09/16/2023 1040   HGB 12.3 11/07/2023 1459   HCT 39.4 11/07/2023 1459   PLT 251 11/07/2023 1459   MCV 83 11/07/2023 1459   MCH 26.0 (L) 11/07/2023 1459   MCH 25.4 (L) 09/16/2023 1040   MCHC 31.2 (L) 11/07/2023 1459   MCHC 30.2 (L) 09/16/2023 1040   RDW 15.3 11/07/2023 1459   LYMPHSABS 2.1 01/04/2022 1022   MONOABS 0.8 12/03/2021 2200   EOSABS 259 09/16/2023 1040   EOSABS 0.5 (H) 01/04/2022 1022   BASOSABS 98 09/16/2023 1040   BASOSABS 0.1 01/04/2022 1022    No results found for: POCLITH, LITHIUM   No results found for: PHENYTOIN, PHENOBARB, VALPROATE, CBMZ   .res Assessment: Plan:    Greater than 50% of 30 min video interview with patient was spent on counseling and coordination of care. We discussed her moderate improvement with anxiety and depression since starting Pristiq  and adding lamictal . We did discussed her report of continued poor sleep 5-6 hours broken sleep. Reports Seroquel  is helping a little bit.  We discussed her extreme amount of family stressors that I feel is a significant catalyst for poor sleep.   We agreed to today to: Continue  Lamictal  to 150  mg daily  Monitor for any sign of rash. Please stop taking Lamictal  and contact office immediately rash develops. Recommend seeking urgent medical attention if rash is severe and/or spreading quickly.  Continue Pristiq  to 100 mg daily in the a.m. after breakfast Will continue hydroxyzine  10 mg 3 times daily as needed Continue  BuSpar  30 mg twice daily Increase Seroquel  to 100 mg at bedtime daily We will follow-up in 4 weeks to reassess To report worsening symptoms or side effects promptly Provided emergency contact information Reviewed PDMP             Kimberly Terrell was seen today for anxiety, depression, follow-up, medication refill, patient education, stress, family problem, insomnia and care giver role strain.  Diagnoses and all orders for this visit:  Major depressive disorder, recurrent episode, moderate (HCC)  Generalized anxiety disorder  Insomnia, unspecified type  Unspecified mood (affective) disorder  Caregiver role strain  Stressful life events affecting family and household     Please see After Visit Summary for patient specific instructions.  Future Appointments  Date Time Provider Department Center  01/16/2024  1:15 PM Amilibia, Jaden, DO [facility] [address]  02/10/2024 11:30 AM [facility] MM 2 [facility]  04/19/2024  9:20 AM Rice, Lonni ORN, MD [facility] None    No orders of the defined types were placed in this encounter.     -------------------------------

## 2024-01-16 ENCOUNTER — Telehealth (HOSPITAL_COMMUNITY): Payer: Self-pay | Admitting: Internal Medicine

## 2024-01-16 ENCOUNTER — Ambulatory Visit: Payer: Self-pay

## 2024-01-16 ENCOUNTER — Ambulatory Visit (INDEPENDENT_AMBULATORY_CARE_PROVIDER_SITE_OTHER): Payer: Self-pay

## 2024-01-16 ENCOUNTER — Other Ambulatory Visit: Payer: Self-pay

## 2024-01-16 VITALS — BP 115/52 | HR 66 | Temp 97.7°F | Ht 65.0 in | Wt 175.8 lb

## 2024-01-16 DIAGNOSIS — D509 Iron deficiency anemia, unspecified: Secondary | ICD-10-CM

## 2024-01-16 DIAGNOSIS — R053 Chronic cough: Secondary | ICD-10-CM | POA: Insufficient documentation

## 2024-01-16 DIAGNOSIS — N951 Menopausal and female climacteric states: Secondary | ICD-10-CM | POA: Diagnosis not present

## 2024-01-16 DIAGNOSIS — R058 Other specified cough: Secondary | ICD-10-CM

## 2024-01-16 DIAGNOSIS — Z79890 Hormone replacement therapy (postmenopausal): Secondary | ICD-10-CM

## 2024-01-16 DIAGNOSIS — Z87891 Personal history of nicotine dependence: Secondary | ICD-10-CM

## 2024-01-16 MED ORDER — ESTRADIOL-PROGESTERONE 0.5-100 MG PO CAPS: 1.0000 | ORAL_CAPSULE | Freq: Every day | ORAL | 3 refills | Status: AC

## 2024-01-16 NOTE — Assessment & Plan Note (Signed)
 Patient and husband state that they have had at least 2 referrals sent out for IV iron infusions for the patient's history of IDA. However, there has been discrepancy with the location of where the IV infusions are being scheduled, and are considered out-of-network with [facility] despite being a part of [place]. They are inquiring if there is a way the patient's ferumoxytol infusions can be done here at the [facility] hospital instead of the infusion center on [address]. Spoke with both our clinic pharmacist, Dr. Brinda, and referral coordinator, and was able to send the correct referral to the IV iron center at [facility]. Informed patient and husband, and they voiced understanding.   - Amb referral for IV Iron Therapy with Ferumoxytol

## 2024-01-16 NOTE — Telephone Encounter (Signed)
 Patient referred to infusion pharmacy team for ambulatory infusion of IV iron.  Insurance - Psychologist, prison and probation services of care - Site of care: [facility] Dx code - D50.9 IV Iron Therapy - Feraheme 510 mg x 2 Infusion appointments - Scheduling team will schedule patient as soon as possible.   Thank you,  Norton Blush, PharmD, BCSCP Pharmacist [facility]

## 2024-01-16 NOTE — Progress Notes (Signed)
 Established Patient Office Visit  Subjective   Patient ID: Kimberly Terrell. Dysart, female    DOB: 1975-09-11  Age: 48 y.o. MRN: [id number]  Chief Complaint  Patient presents with   Follow-up   Anxiety   Depression   Cough    Pt reports having a dry cough  for a week now it is  so bad it is waking her from her sleep     Kimberly Terrell is a 48 year old female with a pertinent past medical history of depression and anxiety, amenorrhea, asthma, and anemia who presents today for concerns regarding vasomotor symptoms of perimenopause. She presents to the clinic today with her husband. Please see problem-based assessment and plan below for details.  Anxiety Patient reports no chest pain, dizziness, nausea or palpitations.    Depression        Associated symptoms include no headaches.  Past medical history includes anxiety.   Cough Pertinent negatives include no chest pain, chills, fever, headaches or heartburn.    Review of Systems  Constitutional:  Negative for chills and fever.  Eyes:  Negative for blurred vision.  Respiratory:  Positive for cough.   Cardiovascular:  Negative for chest pain and palpitations.  Gastrointestinal:  Negative for abdominal pain, heartburn, nausea and vomiting.  Neurological:  Negative for dizziness and headaches.  Endo/Heme/Allergies:        Hot flashes, night sweats, mood fluctuations  Psychiatric/Behavioral:  Positive for depression.       Objective:    BP (!) 115/52 (BP Location: Right Arm, Patient Position: Sitting, Cuff Size: Large)   Pulse 66   Temp 97.7 F (36.5 C) (Oral)   Ht 5' 5 (1.651 m)   Wt 175 lb 12.8 oz (79.7 kg)   SpO2 99%   BMI 29.25 kg/m   Physical Exam Constitutional:      Appearance: Normal appearance. She is normal weight.  HENT:     Nose: Nose normal. No congestion.     Mouth/Throat:     Mouth: Mucous membranes are dry.  Cardiovascular:     Rate and Rhythm: Normal rate and regular rhythm.     Pulses: Normal  pulses.     Heart sounds: Normal heart sounds.  Pulmonary:     Effort: Pulmonary effort is normal. No respiratory distress.     Breath sounds: Normal breath sounds. No wheezing or rhonchi.     Comments: Coughing present during exam Abdominal:     General: Abdomen is flat.     Palpations: Abdomen is soft.     Tenderness: There is no abdominal tenderness.  Skin:    General: Skin is warm and dry.  Neurological:     General: No focal deficit present.     Mental Status: She is alert and oriented to person, place, and time.  Psychiatric:        Mood and Affect: Mood normal.        Behavior: Behavior normal.     ASCVD score is 2.3%.   Assessment & Plan:   Patient seen with Dr. [place].   Problem List Items Addressed This Visit       Other   Productive cough (Chronic)   Patient reports return of dry cough for the 2 weeks. Some associated headaches, but not worsening than her usual. She has a globus sensation but is unable to cough out any phlegm. Has trialed Mucinex  and daily Zyrtec to alleviate her symptoms, but these have not helped. Notes hearing and feeling a  crackling sensation in her throat when she is laying flat which is bothersome to her. Denies sick contacts, SOB, wheezing, or increased inhaler use. Has history of allergies with rhinitis, post-nasal drip, as well as long-standing history of GERD 2/2 ulcer perforation in 2023. On daily Protonix  40mg . Discussed acute phase of cough with patient, and treatment measures such as nasal sprays with Astrepro or Flonase  to open the airways. Also discussed sinus rinses. Informed patient to return to clinic if no improvement in next 2 weeks.  - Intranasal sprays - Sinus rinses - If no improvement, consider adding Famotidine for possible GERD-flare      Iron deficiency anemia, unspecified - Primary (Chronic)   Patient and husband state that they have had at least 2 referrals sent out for IV iron infusions for the patient's history  of IDA. However, there has been discrepancy with the location of where the IV infusions are being scheduled, and are considered out-of-network with [facility] despite being a part of [place]. They are inquiring if there is a way the patient's ferumoxytol infusions can be done here at the [facility] hospital instead of the infusion center on [address]. Spoke with both our clinic pharmacist, Dr. Brinda, and referral coordinator, and was able to send the correct referral to the IV iron center at [facility]. Informed patient and husband, and they voiced understanding.   - Amb referral for IV Iron Therapy with Ferumoxytol      Relevant Orders   Amb Referral to Intravenous Iron Therapy   Vasomotor symptoms due to menopause   Patient amenorrheic for past 8 months, still perimenopausal.  Presented today with worsening hot flashes, mood fluctuations, and night sweats. Her husband is present today and notes she will often sweat through sheets at night, which is concerning for the patient. No history of PE/DVT, no family history of breast or uterine cancers. She quit smoking 2 years ago, and has a history of migraines but not currently on medications for this. Also with a history of stress-induced cardiomyopathy. No CAD, echo on 09/2022 shows EF of 60-65%. Counseled patient and husband on cardiology risk, albeit low, present in patient with starting HRT and recommended following up at next scheduled cardiology appointment. They voiced understanding. Patient would prefer to start oral form of HRT given her allergy and sensitivity to adhesives, which would make estrogen patches difficult.  - Started estradiol-progesterone 0.5-100mg  capsules taken once nightly with dinner - Follow up in 1 month for close monitoring, uptitrate if needed to 1mg -100mg  if symptoms not improving  - Consider nonhormonal medication Veozah if patient cannot continue HRT for any cardiac-related reason.      Relevant Medications    Estradiol-Progesterone 0.5-100 MG CAPS   RESOLVED: Chronic cough    Return in about 1 month (around 02/16/2024) for vasomotor symptom follow up.    Carlye Panameno, DO Internal Medicine Resident, PGY-1 5:24 PM 01/16/2024

## 2024-01-16 NOTE — Assessment & Plan Note (Signed)
 Patient amenorrheic for past 8 months, still perimenopausal.  Presented today with worsening hot flashes, mood fluctuations, and night sweats. Her husband is present today and notes she will often sweat through sheets at night, which is concerning for the patient. No history of PE/DVT, no family history of breast or uterine cancers. She quit smoking 2 years ago, and has a history of migraines but not currently on medications for this. Also with a history of stress-induced cardiomyopathy. No CAD, echo on 09/2022 shows EF of 60-65%. Counseled patient and husband on cardiology risk, albeit low, present in patient with starting HRT and recommended following up at next scheduled cardiology appointment. They voiced understanding. Patient would prefer to start oral form of HRT given her allergy and sensitivity to adhesives, which would make estrogen patches difficult.  - Started estradiol-progesterone 0.5-100mg  capsules taken once nightly with dinner - Follow up in 1 month for close monitoring, uptitrate if needed to 1mg -100mg  if symptoms not improving  - Consider nonhormonal medication Veozah if patient cannot continue HRT for any cardiac-related reason.

## 2024-01-16 NOTE — Assessment & Plan Note (Addendum)
 Patient reports return of dry cough for the 2 weeks. Some associated headaches, but not worsening than her usual. She has a globus sensation but is unable to cough out any phlegm. Has trialed Mucinex  and daily Zyrtec to alleviate her symptoms, but these have not helped. Notes hearing and feeling a crackling sensation in her throat when she is laying flat which is bothersome to her. Denies sick contacts, SOB, wheezing, or increased inhaler use. Has history of allergies with rhinitis, post-nasal drip, as well as long-standing history of GERD 2/2 ulcer perforation in 2023. On daily Protonix  40mg . Discussed acute phase of cough with patient, and treatment measures such as nasal sprays with Astrepro or Flonase  to open the airways. Also discussed sinus rinses. Informed patient to return to clinic if no improvement in next 2 weeks.  - Intranasal sprays - Sinus rinses - If no improvement, consider adding Famotidine for possible GERD-flare

## 2024-01-16 NOTE — Patient Instructions (Addendum)
 Thank you, Ms.Emanuela R. Feldner for allowing us  to provide your care today. Today we discussed adjusting your medications and your dry cough.    Referrals: - IV Iron Infusion Center at [facility] medications: -Flonase , nasal spray once daily in each nostril for 1 week -Sinus rinses -Estradiol-Progesterone 0.5-100 MG, take 1 capsule every evening with food.   I will call if any are abnormal. All of your labs can be accessed through My Chart.   My Chart Access: [email or web address]  Please follow-up in: 1 month to check in about your vasomotor symptoms of menopause    We look forward to seeing you next time. Please call our clinic at [phone] if you have any questions or concerns. The best time to call is Monday-Friday from 9am-4pm, but there is someone available 24/7. If after hours or the weekend, call the main hospital number and ask for the Internal Medicine Resident On-Call. If you need medication refills, please notify your pharmacy one week in advance and they will send us  a request.   Thank you for letting us  take part in your care. Wishing you the best!  Tanaysha Alkins, DO 01/16/2024, 1:19 PM [facility]

## 2024-01-17 ENCOUNTER — Telehealth (HOSPITAL_COMMUNITY): Payer: Self-pay

## 2024-01-17 ENCOUNTER — Telehealth: Payer: Self-pay | Admitting: *Deleted

## 2024-01-17 NOTE — Telephone Encounter (Signed)
 Mammogram appointment reminder mailed to patient  02/10/2024 @ 11:30 am   - [facility] [phone].

## 2024-01-17 NOTE — Telephone Encounter (Signed)
 Auth Submission: NO AUTH NEEDED Site of care: Site of care: [facility] Payer: [employer] [place] Medication & CPT/J Code(s) submitted: Feraheme (ferumoxytol) [id number] Diagnosis Code: D50.9 Route of submission (phone, fax, portal):  Phone # Fax # Auth type: Buy/Bill HB Units/visits requested: 510mg  x 2 doses Reference number:  Approval from: 01/17/24 to 03/28/24

## 2024-01-19 NOTE — Progress Notes (Signed)
 Internal Medicine Clinic Attending  I was physically present during the key portions of the resident provided service and participated in the medical decision making of patient's management care. I reviewed pertinent patient test results.  The assessment, diagnosis, and plan were formulated together and I agree with the documentation in the resident's note.  Rosan Dayton BROCKS, DO

## 2024-01-30 ENCOUNTER — Encounter: Payer: Self-pay | Admitting: Radiology

## 2024-01-30 ENCOUNTER — Ambulatory Visit: Admitting: Orthopedic Surgery

## 2024-01-31 ENCOUNTER — Encounter (HOSPITAL_COMMUNITY)
Admission: RE | Admit: 2024-01-31 | Discharge: 2024-01-31 | Disposition: A | Discharge status: Home / Self Care | Source: Ambulatory Visit

## 2024-01-31 VITALS — BP 117/73 | HR 58 | Temp 97.6°F | Resp 17

## 2024-01-31 DIAGNOSIS — D509 Iron deficiency anemia, unspecified: Secondary | ICD-10-CM | POA: Diagnosis present

## 2024-01-31 MED ORDER — SODIUM CHLORIDE 0.9 % IV SOLN
510.0000 mg | Freq: Once | INTRAVENOUS | Status: AC
  Administered 2024-01-31: 510 mg via INTRAVENOUS
  Filled 2024-01-31: qty 510

## 2024-02-01 ENCOUNTER — Other Ambulatory Visit: Payer: Self-pay | Admitting: Behavioral Health

## 2024-02-01 DIAGNOSIS — G4700 Insomnia, unspecified: Secondary | ICD-10-CM

## 2024-02-06 ENCOUNTER — Ambulatory Visit: Admitting: Orthopedic Surgery

## 2024-02-07 ENCOUNTER — Inpatient Hospital Stay (HOSPITAL_COMMUNITY): Admission: RE | Admit: 2024-02-07 | Source: Ambulatory Visit

## 2024-02-09 ENCOUNTER — Other Ambulatory Visit (INDEPENDENT_AMBULATORY_CARE_PROVIDER_SITE_OTHER): Payer: Self-pay

## 2024-02-09 ENCOUNTER — Ambulatory Visit: Admitting: Orthopedic Surgery

## 2024-02-09 ENCOUNTER — Encounter: Payer: Self-pay | Admitting: Orthopedic Surgery

## 2024-02-09 ENCOUNTER — Encounter: Payer: Self-pay | Admitting: Behavioral Health

## 2024-02-09 ENCOUNTER — Telehealth: Admitting: Behavioral Health

## 2024-02-09 DIAGNOSIS — M7741 Metatarsalgia, right foot: Secondary | ICD-10-CM

## 2024-02-09 DIAGNOSIS — Z91199 Patient's noncompliance with other medical treatment and regimen due to unspecified reason: Secondary | ICD-10-CM

## 2024-02-09 DIAGNOSIS — M79672 Pain in left foot: Secondary | ICD-10-CM

## 2024-02-09 DIAGNOSIS — M79671 Pain in right foot: Secondary | ICD-10-CM | POA: Diagnosis not present

## 2024-02-09 DIAGNOSIS — M6702 Short Achilles tendon (acquired), left ankle: Secondary | ICD-10-CM

## 2024-02-09 DIAGNOSIS — M6701 Short Achilles tendon (acquired), right ankle: Secondary | ICD-10-CM | POA: Diagnosis not present

## 2024-02-09 NOTE — Progress Notes (Signed)
 Office Visit Note   Patient: Kimberly Terrell. Marte           Date of Birth: March 20, 1976           MRN: [id number] Visit Date: 02/09/2024              Requested by: Elnora Ip, MD [address] [place],  [place] [address] PCP: Elnora Ip, MD  Chief Complaint  Patient presents with   Left Foot - Pain      HPI: Discussed the use of AI scribe software for clinical note transcription with the patient, who gave verbal consent to proceed.  History of Present Illness Kimberly Terrell is a 48 year old female who presents with bilateral foot pain and tightness in the Achilles tendons.  She experiences persistent, severe pain in both feet, primarily located in the ball of the foot, which is exacerbated by pressure. She has tried various types of shoes to alleviate the pain, finding some relief with shoes that do not bend easily, but the pain persists.  She has a history of wearing high-heeled shoes for many years due to her previous occupation, which she believes has contributed to the tightness in her Achilles tendons. She has been actively working on stretching her Achilles tendons at the gym.  She has a history of surgery on her right foot for the excision of a soft tissue mass in the second web space in November 2024. Despite this, she reports significant discomfort from calluses beneath the second and third metatarsal heads and feels like her feet are 'falling apart.'  She reports having bunions on both feet and expresses concern about having to manage them long-term.     Assessment & Plan: Visit Diagnoses:  1. Pain in left foot   2. Metatarsalgia, right foot   3. Achilles tendon contracture, bilateral     Plan: Assessment and Plan Assessment & Plan Bilateral forefoot pain with callus formation and limited dorsiflexion due to gastrocnemius contracture Chronic bilateral forefoot pain with callus formation under the second and third metatarsal heads,  secondary to gastrocnemius contracture. Limited dorsiflexion with 30 degrees lacking on the right and 20 degrees on the left. Conservative management with Achilles stretching attempted. Surgical intervention considered due to persistent pain and functional limitations. Explained risks of surgery including potential numbness, 1% risk of infection, and 1% risk of incision not healing. Recovery expected to be approximately one month per foot. - Scheduled outpatient surgery for gastrocnemius recession on the right foot. - Instructed on Achilles stretching exercises: balance on the ball of the foot with knees straight, push heels to the floor, perform three times a day for one minute each session.  Bilateral mild hallux valgus (bunion) Bilateral mild hallux valgus with insufficient angles for insurance coverage of surgical intervention. Pain management through conservative measures discussed. - Recommended wearing wider shoes, such as New Balance, to reduce pressure and pain on the bunion.      Follow-Up Instructions: No follow-ups on file.   Ortho Exam  Patient is alert, oriented, no adenopathy, well-dressed, normal affect, normal respiratory effort. Physical Exam CARDIOVASCULAR: Dorsalis pedis pulse palpable bilaterally. MUSCULOSKELETAL: Mild bunion deformity bilaterally. Callus beneath second and third metatarsal heads. Lacks 30 degrees dorsiflexion to neutral on right, 20 degrees on left with knee extended. Splinting in the second web space.      Imaging: No results found. No images are attached to the encounter.  Labs: Lab Results  Component Value Date   HGBA1C 5.6  11/17/2022   ESRSEDRATE 11 09/16/2023   CRP 3.7 09/16/2023   LABURIC 4.6 09/16/2023   REPTSTATUS 12/12/2021 FINAL 12/06/2021   CULT  12/06/2021    NO GROWTH 5 DAYS Performed at [facility], [address] [place], [place] [address]      Lab Results  Component Value Date   ALBUMIN  4.5 08/12/2022    ALBUMIN  4.6 08/06/2022   ALBUMIN  2.1 (L) 12/05/2021    Lab Results  Component Value Date   MG 1.9 12/15/2021   MG 1.8 12/12/2021   MG 1.9 12/09/2021   No results found for: VD25OH  No results found for: PREALBUMIN    Latest Ref Rng & Units 11/07/2023    2:59 PM 09/16/2023   10:40 AM 01/27/2023   11:50 AM  CBC EXTENDED  WBC 3.4 - 10.8 x10E3/uL 9.4  7.0  10.5   RBC 3.77 - 5.28 x10E6/uL 4.73  4.73  4.49   Hemoglobin 11.1 - 15.9 g/dL 87.6  87.9  88.6   HCT 34.0 - 46.6 % 39.4  39.8  36.2   Platelets 150 - 450 x10E3/uL 251  300  237   NEUT# 1,500 - 7,800 cells/uL  4,508       There is no height or weight on file to calculate BMI.  Orders:  Orders Placed This Encounter  Procedures   XR Foot 2 Views Left   XR Foot 2 Views Right   No orders of the defined types were placed in this encounter.    Procedures: No procedures performed  Clinical Data: No additional findings.  ROS:  All other systems negative, except as noted in the HPI. Review of Systems  Objective: Vital Signs: There were no vitals taken for this visit.  Specialty Comments:  No specialty comments available.  PMFS History: Patient Active Problem List   Diagnosis Date Noted   Vasomotor symptoms due to menopause 01/16/2024   Amenorrhea 11/07/2023   Hot flashes 11/07/2023   Abdominal pain, right lower quadrant 11/07/2023   Iron deficiency anemia, unspecified 09/09/2023   Productive cough 02/03/2023   Cervical cancer screening 01/28/2023   Right knee pain 01/28/2023   History of iron deficiency 12/24/2022   PVC (premature ventricular contraction) 11/17/2022   Leg cramping 11/17/2022   Colon polyps 08/09/2022   Health care maintenance 04/06/2022   Hemorrhoids 04/06/2022   Insomnia 03/09/2022   Pruritic dermatitis 02/04/2022   Heart failure with recovered ejection fraction (HFrecEF) (HCC) 01/04/2022   Perforated viscus 12/04/2021   Gastric ulcer 12/04/2021   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 pylori infection 03/28/2019   Allergic rhinitis 07/25/2017   Depression with anxiety 04/04/2017   Migraine 04/04/2017   DUB (dysfunctional uterine bleeding) 04/04/2017   Past Medical History:  Diagnosis Date   Anemia    Anxiety    Arthritis    Asthma    Bursitis    Right Hip and Right Knee   Depression    Dysrhythmia    H. pylori infection    s/p triple therapy x 2   Migraines     Family History  Problem Relation Age of Onset   Anxiety disorder Mother    Depression Mother    Depression Maternal Aunt  Anxiety disorder Maternal Aunt    Depression Paternal Uncle    Anxiety disorder Paternal Uncle    Depression Daughter    Colon cancer Neg Hx    Esophageal cancer Neg Hx    Gastric cancer Neg Hx    Inflammatory bowel disease Neg Hx    Liver disease Neg Hx    Pancreatic cancer Neg Hx    Rectal cancer Neg Hx    Stomach cancer Neg Hx     Past Surgical History:  Procedure Laterality Date   AUGMENTATION MAMMAPLASTY Bilateral    COSMETIC SURGERY  2008   breast implants    EXCISION MORTON'S NEUROMA Right 02/23/2023   Procedure: RIGHT FOOT EXCISION MASS 2ND WEB SPACE;  Surgeon: Harden Jerona GAILS, MD;  Location: [facility] OR;  Service: Orthopedics;  Laterality: Right;   gastric ulcer surgery     gastric ulcer perforation surgery   LAPAROTOMY N/A 12/04/2021   Procedure: EXPLORATORY LAPAROTOMY;  Surgeon: Vanderbilt Ned, MD;  Location: [facility] OR;  Service: General;  Laterality: N/A;   Social History   Occupational History   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, Advertising  Tobacco Use   Smoking status: Former    Current packs/day: 0.00    Types: Cigarettes    Quit date: 12/03/2021    Years since quitting: 2.1   Smokeless tobacco: Never    Tobacco comments:    Trying   Vaping Use   Vaping status: Never Used  Substance and Sexual Activity   Alcohol use: No   Drug use: No   Sexual activity: Yes    Birth control/protection: Surgical    Comment: husband has had a vasectomy

## 2024-02-09 NOTE — Progress Notes (Signed)
 Logged into my chart and waited until 916. Looks like pt may have logged in around that time. She may have had tech difficulties but not sure if she tried to call in .  No charge this time but she will have to RS.

## 2024-02-10 ENCOUNTER — Ambulatory Visit

## 2024-02-14 ENCOUNTER — Other Ambulatory Visit: Payer: Self-pay | Admitting: Behavioral Health

## 2024-02-14 DIAGNOSIS — G4700 Insomnia, unspecified: Secondary | ICD-10-CM

## 2024-02-18 ENCOUNTER — Encounter: Payer: Self-pay | Admitting: Orthopedic Surgery

## 2024-02-22 ENCOUNTER — Encounter: Payer: Self-pay | Admitting: Behavioral Health

## 2024-02-22 ENCOUNTER — Telehealth (INDEPENDENT_AMBULATORY_CARE_PROVIDER_SITE_OTHER): Admitting: Behavioral Health

## 2024-02-22 DIAGNOSIS — R454 Irritability and anger: Secondary | ICD-10-CM

## 2024-02-22 DIAGNOSIS — Z6379 Other stressful life events affecting family and household: Secondary | ICD-10-CM

## 2024-02-22 DIAGNOSIS — F331 Major depressive disorder, recurrent, moderate: Secondary | ICD-10-CM | POA: Diagnosis not present

## 2024-02-22 DIAGNOSIS — F39 Unspecified mood [affective] disorder: Secondary | ICD-10-CM | POA: Diagnosis not present

## 2024-02-22 DIAGNOSIS — G4700 Insomnia, unspecified: Secondary | ICD-10-CM

## 2024-02-22 MED ORDER — LAMOTRIGINE 200 MG PO TABS: 200.0000 mg | ORAL_TABLET | Freq: Every day | ORAL | 2 refills | Status: AC

## 2024-02-22 MED ORDER — QUETIAPINE FUMARATE 100 MG PO TABS: 100.0000 mg | ORAL_TABLET | Freq: Every day | ORAL | 3 refills | Status: AC

## 2024-02-22 NOTE — Progress Notes (Signed)
 Kimberly Terrell [id number] 1975-07-11 48 y.o.  Virtual Visit via Video Note  I connected with pt @ on 02/22/24 at  2:30 PM EST by a video enabled telemedicine application and verified that I am speaking with the correct person using two identifiers.   I discussed the limitations of evaluation and management by telemedicine and the availability of in person appointments. The patient expressed understanding and agreed to proceed.  I discussed the assessment and treatment plan with the patient. The patient was provided an opportunity to ask questions and all were answered. The patient agreed with the plan and demonstrated an understanding of the instructions.   The patient was advised to call back or seek an in-person evaluation if the symptoms worsen or if the condition fails to improve as anticipated.  I provided 30 minutes of non-face-to-face time during this encounter.  The patient was located at home.  The provider was located at [facility].   Redell DELENA Pizza, NP   Subjective:   Patient ID:  Kimberly Terrell is a 48 y.o. (DOB 12/29/1975) female.  Chief Complaint:  Chief Complaint  Patient presents with   Depression   Anxiety   Follow-up   Medication Refill   Patient Education   Anger Reaction   Drainage from Incision    HPI [place], 48 year old female presents to this office via video visit for follow up and medication management. Collateral information should be considered reliable.  Patient is calm but somewhat flat. Complains  Seeing some improvement with moods,anxiety and depression. Has some intermittent irritability. She is complaining of continued poor sleep quality today. Says Seroquel  has been partially effective so far.    Says that her anxiety today is 2/10, and depression is 2/10.    She denies history of mania, no psychosis, no auditory or visual hallucinations.  Denies SI or HI.   Past psychiatric medication  trials: Xanax  BuSpar  Citalopram  Trazodone  Rozerem  Lexapro      Review of Systems:  Review of Systems  Constitutional: Negative.   Allergic/Immunologic: Negative.   Neurological: Negative.   Psychiatric/Behavioral:  Positive for dysphoric mood.     Medications: I have reviewed the patient's current medications.  Current Outpatient Medications  Medication Sig Dispense Refill   lamoTRIgine  (LAMICTAL ) 100 MG tablet Take 1 tablet (100 mg total) by mouth daily. 30 tablet 1   lamoTRIgine  (LAMICTAL ) 200 MG tablet Take 1 tablet (200 mg total) by mouth daily. 30 tablet 2   QUEtiapine  (SEROQUEL ) 50 MG tablet TAKE 1 TABLET BY MOUTH EVERYDAY AT BEDTIME 30 tablet 0   ramelteon  (ROZEREM ) 8 MG tablet TAKE 1 TABLET (8 MG TOTAL) BY MOUTH AT BEDTIME AS NEEDED FOR SLEEP. TAKE 30 MINUTES BEFORE BEDTIME. 30 tablet 0   acetaminophen  (TYLENOL ) 500 MG tablet Take 2 tablets (1,000 mg total) by mouth every 6 (six) hours as needed. 30 tablet 0   Acetaminophen -guaiFENesin  (MUCINEX  COLD & FLU) 325-200 MG CAPS Take 2 tablets by mouth daily as needed (cold symptoms).     Budeson-Glycopyrrol-Formoterol (BREZTRI  AEROSPHERE) 160-9-4.8 MCG/ACT AERO Inhale 2 puffs into the lungs in the morning and at bedtime. 1 each 11   busPIRone  (BUSPAR ) 30 MG tablet Take 1 tablet (30 mg total) by mouth 2 (two) times daily. 180 tablet 2   desvenlafaxine  (PRISTIQ ) 100 MG 24 hr tablet Take 1 tablet (100 mg total) by mouth daily. 30 tablet 3   diclofenac  sodium (VOLTAREN ) 1 % GEL Apply 2 g topically 4 (four) times daily. (Patient taking differently: Apply  2 g topically 4 (four) times daily as needed (pain).) 300 g 1   gabapentin  (NEURONTIN ) 400 MG capsule TAKE 1 CAPSULE (400 MG TOTAL) BY MOUTH 4 (FOUR) TIMES DAILY. 120 capsule 2   hydrOXYzine  (ATARAX ) 10 MG tablet TAKE 1 TABLET BY MOUTH THREE TIMES A DAY AS NEEDED 270 tablet 1   lamoTRIgine  (LAMICTAL ) 150 MG tablet Take 1 tablet (150 mg total) by mouth daily. 30 tablet 3   levalbuterol   (XOPENEX  HFA) 45 MCG/ACT inhaler Inhale 2 puffs into the lungs every 6 (six) hours as needed for wheezing or shortness of breath. 1 each 12   metoprolol  succinate (TOPROL -XL) 50 MG 24 hr tablet TAKE 1 TABLET BY MOUTH EVERY DAY WITH OR IMMEDIATELY FOLLOWING A MEAL 90 tablet 3   pantoprazole  (PROTONIX ) 40 MG tablet TAKE 1 TABLET BY MOUTH EVERY DAY 90 tablet 1   polycarbophil (FIBERCON) 625 MG tablet Take 625 mg by mouth daily.     QUEtiapine  (SEROQUEL ) 100 MG tablet Take 1 tablet (100 mg total) by mouth at bedtime. 30 tablet 3   senna-docusate (SENOKOT-S) 8.6-50 MG tablet Take 1 tablet by mouth at bedtime as needed for mild constipation.     No current facility-administered medications for this visit.    Medication Side Effects: None  Allergies:  Allergies  Allergen Reactions   Imitrex  [Sumatriptan ] Other (See Comments)    Gave me lockjaw   Tape Other (See Comments)    The plastic-like tape causes redness and irritation   Aspirin Other (See Comments)    Caused chest pain    Past Medical History:  Diagnosis Date   Anemia    Anxiety    Arthritis    Asthma    Bursitis    Right Hip and Right Knee   Depression    Dysrhythmia    H. pylori infection    s/p triple therapy x 2   Migraines     Family History  Problem Relation Age of Onset   Anxiety disorder Mother    Depression Mother    Depression Maternal Aunt    Anxiety disorder Maternal Aunt    Depression Paternal Uncle    Anxiety disorder Paternal Uncle    Depression Daughter    Colon cancer Neg Hx    Esophageal cancer Neg Hx    Gastric cancer Neg Hx    Inflammatory bowel disease Neg Hx    Liver disease Neg Hx    Pancreatic cancer Neg Hx    Rectal cancer Neg Hx    Stomach cancer Neg Hx     Social History   Socioeconomic History   Marital status: Married    Spouse name: Daysi Boggan   Number of children: 1   Years of education: 9th   Highest education level: 9th grade  Occupational History   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, Advertising  Tobacco Use   Smoking status: Former    Current packs/day: 0.00    Types: Cigarettes    Quit date: 12/03/2021    Years since quitting: 2.2   Smokeless tobacco: Never   Tobacco comments:    Trying   Vaping Use   Vaping status: Never Used  Substance and Sexual Activity   Alcohol use: No   Drug use: No   Sexual activity: Yes    Birth control/protection: Surgical    Comment: husband has had a vasectomy  Other Topics Concern   Not on file  Social History Narrative   Right handed    Lives  with [facility]   Caffeine use: coffee-daily   Lives in [place], with husband, mother in social worker, two grandchildren and one trans daughter   Social Drivers of Health   Financial Resource Strain: Low Risk (04/16/2021)   Received from [facility]   Overall Financial Resource Strain (CARDIA)    Difficulty of Paying Living Expenses: Not hard at all  Food Insecurity: No Food Insecurity (04/29/2023)   Hunger Vital Sign    Worried About Running Out of Food in the Last Year: Never true    Ran Out of Food in the Last Year: Never true  Transportation Needs: No Transportation Needs (04/29/2023)   PRAPARE - Administrator, Civil Service (Medical): No    Lack of Transportation (Non-Medical): No  Physical Activity: Sufficiently Active (10/31/2020)   Received from [facility]   Exercise Vital Sign    On average, how many days per week do you engage in moderate to strenuous exercise (like a brisk walk)?: 5 days    On average, how many minutes do you engage in exercise at this level?: 30 min  Stress: Stress Concern Present (10/31/2020)   Received from [facility] Occupational [facility] - Occupational Stress Questionnaire    Feeling of Stress : To some extent  Social Connections: Moderately Integrated (10/31/2020)   Received from [facility]   Social Connection and Isolation Panel    In a typical week, how many times do you talk on the phone with family,  friends, or neighbors?: Once a week    How often do you get together with friends or relatives?: Once a week    How often do you attend church or religious services?: 1 to 4 times per year    Do you belong to any clubs or organizations such as church groups, unions, fraternal or athletic groups, or school groups?: No    How often do you attend meetings of the clubs or organizations you belong to?: 1 to 4 times per year    Are you married, widowed, divorced, separated, never married, or living with a partner?: Married  Intimate Partner Violence: Not At Risk (04/29/2023)   Humiliation, Afraid, Rape, and Kick questionnaire    Fear of Current or Ex-Partner: No    Emotionally Abused: No    Physically Abused: No    Sexually Abused: No    Past Medical History, Surgical history, Social history, and Family history were reviewed and updated as appropriate.   Please see review of systems for further details on the patient's review from today.   Objective:   Physical Exam:  There were no vitals taken for this visit.  Physical Exam Neurological:     Mental Status: She is alert and oriented to person, place, and time.  Psychiatric:        Attention and Perception: Attention and perception normal.        Mood and Affect: Mood normal.        Speech: Speech normal.        Behavior: Behavior normal. Behavior is cooperative.        Cognition and Memory: Cognition and memory normal.        Judgment: Judgment normal.     Comments: Insight intact     Lab Review:     Component Value Date/Time   NA 135 02/23/2023 0714   NA 138 11/17/2022 1427   K 4.1 02/23/2023 0714   CL 104  02/23/2023 0714   CO2 21 (L) 02/23/2023 0714   GLUCOSE 96 02/23/2023 0714   BUN 9 02/23/2023 0714   BUN 10 11/17/2022 1427   CREATININE 0.84 02/23/2023 0714   CALCIUM 9.1 02/23/2023 0714   PROT 7.3 08/12/2022 1845   PROT 7.0 08/06/2022 1018   ALBUMIN  4.5 08/12/2022 1845   ALBUMIN  4.6 08/06/2022 1018   AST 15  08/12/2022 1845   ALT 11 08/12/2022 1845   ALKPHOS 74 08/12/2022 1845   BILITOT 0.3 08/12/2022 1845   BILITOT <0.2 08/06/2022 1018   GFRNONAA >60 02/23/2023 0714   GFRAA 117 04/09/2020 1113       Component Value Date/Time   WBC 9.4 11/07/2023 1459   WBC 7.0 09/16/2023 1040   RBC 4.73 11/07/2023 1459   RBC 4.73 09/16/2023 1040   HGB 12.3 11/07/2023 1459   HCT 39.4 11/07/2023 1459   PLT 251 11/07/2023 1459   MCV 83 11/07/2023 1459   MCH 26.0 (L) 11/07/2023 1459   MCH 25.4 (L) 09/16/2023 1040   MCHC 31.2 (L) 11/07/2023 1459   MCHC 30.2 (L) 09/16/2023 1040   RDW 15.3 11/07/2023 1459   LYMPHSABS 2.1 01/04/2022 1022   MONOABS 0.8 12/03/2021 2200   EOSABS 259 09/16/2023 1040   EOSABS 0.5 (H) 01/04/2022 1022   BASOSABS 98 09/16/2023 1040   BASOSABS 0.1 01/04/2022 1022    No results found for: POCLITH, LITHIUM   No results found for: PHENYTOIN, PHENOBARB, VALPROATE, CBMZ   .res Assessment: Plan:   Greater than 50% of 30 min video interview with patient was spent on counseling and coordination of care. We discussed her moderate improvement with anxiety and depression since starting Pristiq  and adding lamictal .  We discussed her improved sleep but still having problems with irritability and anger reaction.  We discussed further increasing her Lamictal  today to see if we can make some progress.  I explained that if this did not help that we may consider another medication next visit.    We agreed to today to:  Increase  Lamictal  to 200  mg daily  Monitor for any sign of rash. Please stop taking Lamictal  and contact office immediately rash develops. Recommend seeking urgent medical attention if rash is severe and/or spreading quickly.  Continue Pristiq  to 100 mg daily in the a.m. after breakfast Will continue hydroxyzine  10 mg 3 times daily as needed Continue  BuSpar  30 mg twice daily Continue Seroquel  to 100 mg at bedtime daily We will follow-up in 12 weeks to  reassess To report worsening symptoms or side effects promptly Provided emergency contact information Reviewed PDMP                Cree was seen today for depression, anxiety, follow-up, medication refill, patient education, anger reaction and drainage from incision.  Diagnoses and all orders for this visit:  Unspecified mood (affective) disorder -     lamoTRIgine  (LAMICTAL ) 200 MG tablet; Take 1 tablet (200 mg total) by mouth daily.  Major depressive disorder, recurrent episode, moderate (HCC)  Stressful life events affecting family and household  Insomnia, unspecified type -     QUEtiapine  (SEROQUEL ) 100 MG tablet; Take 1 tablet (100 mg total) by mouth at bedtime.  Anger reaction -     lamoTRIgine  (LAMICTAL ) 200 MG tablet; Take 1 tablet (200 mg total) by mouth daily.     Please see After Visit Summary for patient specific instructions.  Future Appointments  Date Time Provider Department Center  03/02/2024 10:15 AM  Marylu Gee, [place] [facility] [address]  04/19/2024  9:20 AM Jeannetta, Lonni ORN, MD [facility] None    No orders of the defined types were placed in this encounter.     -------------------------------

## 2024-03-02 ENCOUNTER — Telehealth: Admitting: Student

## 2024-03-02 ENCOUNTER — Ambulatory Visit: Admitting: Student

## 2024-03-02 DIAGNOSIS — N951 Menopausal and female climacteric states: Secondary | ICD-10-CM

## 2024-03-02 MED ORDER — BIJUVA 1-100 MG PO CAPS: 1.0000 | ORAL_CAPSULE | Freq: Every day | ORAL | 0 refills | Status: DC

## 2024-03-02 NOTE — Progress Notes (Signed)
  [facility] Internal Medicine Residency Telephone Encounter Continuity Care Appointment  HPI:  This telephone encounter was created for [name] on 03/02/2024 for the following purpose/cc : Medication Follow up    Past Medical History:  Past Medical History:  Diagnosis Date   Anemia    Anxiety    Arthritis    Asthma    Bursitis    Right Hip and Right Knee   Depression    Dysrhythmia    H. pylori infection    s/p triple therapy x 2   Migraines      ROS:  Denies any headache, rash, nausea or vomiting, claudication    Assessment / Plan / Recommendations:  Please see A&P under problem oriented charting for assessment of the patient's acute and chronic medical conditions.    #Vasomotor symptoms due to menopause Kimberly Terrell was seen today via video televisit to discuss her vasomotor symptoms. She reports that she continues to experience hot flashes and night sweats, although her mood fluctuations have improved somewhat. She denies headache, rash, diarrhea, nausea, vomiting, claudication Given that she is tolerating the lower dose, we will increase her hormone therapy to estradiol -progesterone  1 mg / 100 mg, which is the maximum dose for this medication. She will follow up with us  in one month. She is scheduled for surgery on 03/16/2024, and may follow up via video visit depending on her postoperative recovery. Plan  - Prescribe Estradiol -Progesterone  1 mg / 100 mg capsule, take once daily.  - Follow up in 4 weeks.  - Assess medication efficacy and adherence at the next visit.  - If cardiovascular concerns arise or symptoms do not improve, consider nonhormonal therapy such as fezolinetant  through shared decision-making as Dr Isobel had recommended.    As always, pt is advised that if symptoms worsen or new symptoms arise, they should go to an urgent care facility or to to ER for further evaluation.   Consent and Medical Decision Making:  Patient discussed with Dr.  CHARLENA Terrell This is a telephone encounter between [place] and [place] on 03/02/2024 for medication follow up . The visit was conducted with the patient located at home and Kimberly Terrell at [facility]. The patient's identity was confirmed using their DOB and current address. The patient has consented to being evaluated through a telephone encounter and understands the associated risks (an examination cannot be done and the patient may need to come in for an appointment) / benefits (allows the patient to remain at home, decreasing exposure to coronavirus). I personally spent 30 minutes on medical discussion.

## 2024-03-02 NOTE — Assessment & Plan Note (Signed)
 Ms. Herber was seen today via video televisit to discuss her vasomotor symptoms. She reports that she continues to experience hot flashes and night sweats, although her mood fluctuations have improved somewhat. She denies headache, rash, diarrhea, nausea, vomiting, claudication Given that she is tolerating the lower dose, we will increase her hormone therapy to estradiol -progesterone  1 mg / 100 mg, which is the maximum dose for this medication. She will follow up with us  in one month. She is scheduled for surgery on 03/16/2024, and may follow up via video visit depending on her postoperative recovery. Plan  - Prescribe Estradiol -Progesterone  1 mg / 100 mg capsule, take once daily.  - Follow up in 4 weeks.  - Assess medication efficacy and adherence at the next visit.  - If cardiovascular concerns arise or symptoms do not improve, consider nonhormonal therapy such as fezolinetant through shared decision-making as Dr Isobel had recommended

## 2024-03-09 NOTE — Progress Notes (Signed)
 Internal Medicine Clinic Attending  Case discussed with the resident at the time of the visit.  We reviewed the resident's history and exam and pertinent patient test results.  I agree with the assessment, diagnosis, and plan of care documented in the resident's note.

## 2024-03-12 ENCOUNTER — Other Ambulatory Visit: Payer: Self-pay | Admitting: Behavioral Health

## 2024-03-12 DIAGNOSIS — G4700 Insomnia, unspecified: Secondary | ICD-10-CM

## 2024-03-12 NOTE — Telephone Encounter (Signed)
 Please call pt to schedule an appt. She is due in [place].  Thank you

## 2024-03-13 NOTE — Telephone Encounter (Signed)
 Is patient currently taking? Did not see listed in Brian's last office note unless I missed it.

## 2024-03-14 NOTE — Telephone Encounter (Signed)
 Spoke with the patient, who confirmed she is continuing to take ramelteon  as prescribed

## 2024-03-15 ENCOUNTER — Other Ambulatory Visit: Payer: Self-pay

## 2024-03-15 ENCOUNTER — Encounter (HOSPITAL_COMMUNITY): Payer: Self-pay | Admitting: Orthopedic Surgery

## 2024-03-15 NOTE — Progress Notes (Signed)
 SDW CALL  Patient was given pre-op instructions over the phone. The opportunity was given for the patient to ask questions. No further questions asked. Patient verbalized understanding of instructions given.   PCP - [facility] Cardiologist - Dr. Eulas Furbish - LOV 11/15/22  PPM/ICD - denies Device Orders - n/a Rep Notified -  n/a  Chest x-ray - denies EKG - DOS Stress Test - denies  ECHO - 10/15/22 Cardiac Cath - denies  Sleep Study - denies CPAP -  n/a  No DM  Last dose of GLP1 agonist-  n/a GLP1 instructions:  n/a  Blood Thinner Instructions: n/a Aspirin Instructions: n/a  ERAS Protcol - clears until 0700 PRE-SURGERY Ensure or G2- n/a  COVID TEST- n/a   Anesthesia review: yes - due to stress induced cardiomyopathy  Patient denies shortness of breath, fever, cough and chest pain over the phone call   All instructions explained to the patient, with a verbal understanding of the material. Patient agrees to go over the instructions while at home for a better understanding.

## 2024-03-15 NOTE — Anesthesia Preprocedure Evaluation (Addendum)
 Anesthesia Evaluation  Patient identified by MRN, date of birth, ID band Patient awake    Reviewed: Allergy & Precautions, H&P , NPO status , Patient's Chart, lab work & pertinent test results  Airway Mallampati: II  TM Distance: >3 FB Neck ROM: Full    Dental no notable dental hx. (+) Teeth Intact, Dental Advisory Given   Pulmonary asthma , Patient abstained from smoking., former smoker   Pulmonary exam normal breath sounds clear to auscultation       Cardiovascular Exercise Tolerance: Good negative cardio ROS Normal cardiovascular exam Rhythm:Regular Rate:Normal     Neuro/Psych  PSYCHIATRIC DISORDERS Anxiety Depression    negative neurological ROS  negative psych ROS   GI/Hepatic negative GI ROS, Neg liver ROS, PUD,GERD  ,,  Endo/Other  negative endocrine ROS    Renal/GU negative Renal ROS  negative genitourinary   Musculoskeletal   Abdominal Normal abdominal exam  (+)   Peds  Hematology negative hematology ROS (+)   Anesthesia Other Findings   Reproductive/Obstetrics negative OB ROS                              Anesthesia Physical Anesthesia Plan  ASA: 3  Anesthesia Plan: General and Regional   Post-op Pain Management: Tylenol  PO (pre-op)*, Celebrex PO (pre-op)* and Regional block*   Induction: Intravenous  PONV Risk Score and Plan: 4 or greater and Treatment may vary due to age or medical condition, Ondansetron  and Midazolam   Airway Management Planned: LMA  Additional Equipment: None  Intra-op Plan:   Post-operative Plan: Extubation in OR  Informed Consent: I have reviewed the patients History and Physical, chart, labs and discussed the procedure including the risks, benefits and alternatives for the proposed anesthesia with the patient or authorized representative who has indicated his/her understanding and acceptance.     Dental advisory given  Plan Discussed with:  CRNA and Anesthesiologist  Anesthesia Plan Comments: (PAT note written 03/15/2024 by Allison Zelenak, PA-C.  )         Anesthesia Quick Evaluation

## 2024-03-15 NOTE — Progress Notes (Signed)
 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  History includes former smoker (quit 12/03/2021), stress inducted cardiomyopathy (11/2021, see below), dysrhythmia (tachycardia, frequent PVCs, improved on b-blocker), asthma, migraines, anemia, anxiety, depression, right foot Morton's neuroma (s/p excision 02/23/2023), augmentation mammaplasty, perforated PUD (s/p exploratory laparotomy with closure of gastric wall ulcer 12/04/2021).   She has had previous cardiology evaluations for tachycardia (in 2021) and more recently in 2023-2024 for stress-induced cardiomyopathy and tachycardia/PVCs. She was admitted in September 2023 with peritonitis due to a perforated peptic ulcer complicated by NSTEMI due to stress-induced cardiomyopathy. EF was 40-45%. She was discharged on GDMT. CCTA 12/2022 showed CAC of 0, normal coronaries. Repeat TTE in November 2023 showed that her EF had normalized. June 2024 event monitor showed SR with frequent PVCs (8% burden), < 1% SVEs. Last TTE in July 2024 showed LVEF 60-65%, no RWMA, normal diastolic parameters, normal RV systolic function, normal PASP, estimated RVSP 29.4 mmHg, mildly dilated LA, normal LV structure with moderate MR. Last seen by EP Dr. Nancey on 11/15/2022.  At that time was noted her symptomatic PVCs had improved with an increase to metoprolol  XL to 50 mg.  No further intervention recommended.  She was advised to follow-up with the EP clinic on an as-needed basis.  She is for updated labs and EKG as indicated on arrival. Anesthesia team to evaluate on the day of surgery.    VS:  Wt Readings from Last 3  Encounters:  01/16/24 79.7 kg  11/07/23 78.3 kg  05/23/23 83.8 kg   BP Readings from Last 3 Encounters:  01/31/24 117/73  01/16/24 (!) 115/52  11/07/23 114/65   Pulse Readings from Last 3 Encounters:  01/31/24 (!) 58  01/16/24 66  11/07/23 64     PROVIDERS: Elnora Ip, MD is PCP  Nancey Scotts, MD is EP cardiologist, as needed follow-up 11/15/2022 visit.    LABS: For day of surgery as indicated. Most recent results in [facility] include: Lab Results  Component Value Date   WBC 9.4 11/07/2023   HGB 12.3 11/07/2023   HCT 39.4 11/07/2023   PLT 251 11/07/2023   GLUCOSE 96 02/23/2023   NA 135 02/23/2023   K 4.1 02/23/2023   CL 104 02/23/2023   CREATININE 0.84 02/23/2023   BUN 9 02/23/2023   CO2 21 (L) 02/23/2023   HGBA1C 5.6 11/17/2022     PFTs 07/19/2022: FVC 2.97 (82%), post 3.29 (91%). FEV1 2.58 (89%), post 2.79 (96%). DLCO unc/cor 19.29 (90%).   IMAGES: Xray right foot 02/09/2024: 2 view radiographs of the right foot shows a hallux valgus angle of 25 degrees and intermetatarsal angle of 10 degrees.  She does have a long 2nd, 3rd and 4th metatarsal.  No stress fractures.  Joint spaces are congruent.    EKG: For updated EKG on day of surgery as indicated.  EKG 8/192024: Normal sinus rhythm.  Rate 63. Rightward axis. Nonspecific ST abnormality    CV: TTE 10/15/2022:  1. Left ventricular ejection fraction, by estimation, is 60 to 65%. The left ventricle has normal function. The left ventricle has no regional wall motion abnormalities. Left ventricular diastolic parameters  were normal.   2. Right ventricular systolic function is normal. The right ventricular size is normal. There is normal pulmonary artery systolic pressure. The estimated right ventricular systolic pressure is 29.4 mmHg.   3. Left atrial size was mildly dilated.   4. The mitral valve is normal in structure. Moderate mitral valve regurgitation. No evidence of mitral stenosis.   5. The aortic  valve is normal in structure. Aortic valve regurgitation is not visualized. No aortic stenosis is present.   6. The inferior vena cava is normal in size with greater than 50% respiratory variability, suggesting right atrial pressure of 3 mmHg.  - Comparison: 02/04/2020 LVEF 60-65%; 12/04/2021 LVEF 40-45% in setting of perforated gastric ulcer; 12/14/2021 LVEF 55-60%; 02/03/2022 LVEF 55%    Event monitor 09/03/2022 - 09/10/2022: *The predominant rhythm was Sinus with Frequent Ventricular Ectopy. *The Maximum Heart Rate recorded was 214 bpm, 06/09 18:55:55, the Minimum Heart Rate recorded was 53 bpm, 06/14 00:45:59, and the Average Heart Rate was 80 bpm. *There were 60,695 VE beats with a burden of 8 %. There were 1,262 occurrences of Ventricular Tachycardia with the Fastest episode 214 bpm, 06/09 18:55:55, and the Longest episode 10 beats, 06/10 08:39:53. *There were 419 SVE beats with a burden of <1 %. There were 3 occurrences of Supraventricular Tachycardia with the Fastest episode 166 bpm, 06/09 07:51:55, and the Longest episode 5 beats, 06/13 09:22:53.   CTA Coronary 01/05/2022: IMPRESSION: 1.  Calcium score 0 2.  Normal right dominant coronary arteries 3.  Normal ascending thoracic aorta 2.8 cm  Past Medical History:  Diagnosis Date   Anemia    Anxiety    Arthritis    Asthma    Bursitis    Right Hip and Right Knee   Depression    Dysrhythmia    H. pylori infection    s/p triple therapy x 2   Migraines     Past Surgical History:  Procedure Laterality Date   AUGMENTATION MAMMAPLASTY Bilateral    COSMETIC SURGERY  2008   breast implants    EXCISION MORTON'S NEUROMA Right 02/23/2023   Procedure: RIGHT FOOT EXCISION MASS 2ND WEB SPACE;  Surgeon: Harden Jerona GAILS, MD;  Location: [facility];  Service: Orthopedics;  Laterality: Right;   gastric ulcer surgery     gastric ulcer perforation surgery   LAPAROTOMY N/A 12/04/2021   Procedure: EXPLORATORY LAPAROTOMY;  Surgeon: Vanderbilt Ned,  MD;  Location: [facility];  Service: General;  Laterality: N/A;    MEDICATIONS:  acetaminophen  (TYLENOL ) 500 MG tablet   Acetaminophen -guaiFENesin  (MUCINEX  COLD & FLU) 325-200 MG CAPS   Budeson-Glycopyrrol-Formoterol (BREZTRI  AEROSPHERE) 160-9-4.8 MCG/ACT AERO   busPIRone  (BUSPAR ) 30 MG tablet   desvenlafaxine  (PRISTIQ ) 100 MG 24 hr tablet   diclofenac  sodium (VOLTAREN ) 1 % GEL   ECHINACEA EXTRACT PO   Estradiol -Progesterone  (BIJUVA ) 1-100 MG CAPS   gabapentin  (NEURONTIN ) 400 MG capsule   hydrOXYzine  (ATARAX ) 10 MG tablet   lamoTRIgine  (LAMICTAL ) 200 MG tablet   levalbuterol  (XOPENEX  HFA) 45 MCG/ACT inhaler   metoprolol  succinate (TOPROL -XL) 50 MG 24 hr tablet   pantoprazole  (PROTONIX ) 40 MG tablet   polycarbophil (FIBERCON) 625 MG tablet   QUEtiapine  (SEROQUEL ) 100 MG tablet   ramelteon  (ROZEREM ) 8 MG tablet   senna-docusate (SENOKOT-S) 8.6-50 MG tablet    Isaiah Ruder, PA-C Surgical Short Stay/Anesthesiology [facility] Phone [phone] [facility] Phone [phone] 03/15/2024 1:55 PM

## 2024-03-15 NOTE — H&P (Signed)
 Bronwyn R. Saldierna is an 48 y.o. female.   Chief Complaint: Achilles contracture right > left LE HPI: Vesna R. Heminger is a 48 year old female who presents with bilateral foot pain and tightness in the Achilles tendons.   She experiences persistent, severe pain in both feet, primarily located in the ball of the foot, which is exacerbated by pressure. She has tried various types of shoes to alleviate the pain, finding some relief with shoes that do not bend easily, but the pain persists.   She has a history of wearing high-heeled shoes for many years due to her previous occupation, which she believes has contributed to the tightness in her Achilles tendons. She has been actively working on stretching her Achilles tendons at the gym.   She has a history of surgery on her right foot for the excision of a soft tissue mass in the second web space in November 2024. Despite this, she reports significant discomfort from calluses beneath the second and third metatarsal heads and feels like her feet are 'falling apart.'   She reports having bunions on both feet and expresses concern about having to manage them long-term.    Past Medical History:  Diagnosis Date   Anemia    Anxiety    Arthritis    Asthma    Bursitis    Right Hip and Right Knee   Depression    Dysrhythmia    H. pylori infection    s/p triple therapy x 2   Migraines     Past Surgical History:  Procedure Laterality Date   AUGMENTATION MAMMAPLASTY Bilateral    COSMETIC SURGERY  2008   breast implants    EXCISION MORTON'S NEUROMA Right 02/23/2023   Procedure: RIGHT FOOT EXCISION MASS 2ND WEB SPACE;  Surgeon: Harden Jerona GAILS, MD;  Location[facility];  Service: Orthopedics;  Laterality: Right;   gastric ulcer surgery     gastric ulcer perforation surgery   LAPAROTOMY N/A 12/04/2021   Procedure: EXPLORATORY LAPAROTOMY;  Surgeon: Vanderbilt Ned, MD;  Location[facility];  Service: General;  Laterality: N/A;    Family History  Problem  Relation Age of Onset   Anxiety disorder Mother    Depression Mother    Depression Maternal Aunt    Anxiety disorder Maternal Aunt    Depression Paternal Uncle    Anxiety disorder Paternal Uncle    Depression Daughter    Colon cancer Neg Hx    Esophageal cancer Neg Hx    Gastric cancer Neg Hx    Inflammatory bowel disease Neg Hx    Liver disease Neg Hx    Pancreatic cancer Neg Hx    Rectal cancer Neg Hx    Stomach cancer Neg Hx    Social History:  reports that she quit smoking about 2 years ago. Her smoking use included cigarettes. She smoked an average of 1 pack per day. She has never used smokeless tobacco. She reports that she does not drink alcohol and does not use drugs.  Allergies: Allergies[1]  No medications prior to admission.    No results found for this or any previous visit (from the past 48 hours). No results found.  Review of Systems  All other systems reviewed and are negative.   There were no vitals taken for this visit. Physical Exam  Patient is alert, oriented, no adenopathy, well-dressed, normal affect, normal respiratory effort.  CARDIOVASCULAR: Dorsalis pedis pulse palpable bilaterally. MUSCULOSKELETAL: Mild bunion deformity bilaterally. Callus beneath second and third  metatarsal heads. Lacks 30 degrees dorsiflexion to neutral on right, 20 degrees on left with knee extended. Splinting in the second web space.    Assessment/Plan Visit Diagnoses:  1. Pain in left foot   2. Metatarsalgia, right foot   3. Achilles tendon contracture, bilateral        Bilateral forefoot pain with callus formation and limited dorsiflexion due to gastrocnemius contracture Chronic bilateral forefoot pain with callus formation under the second and third metatarsal heads, secondary to gastrocnemius contracture. Limited dorsiflexion with 30 degrees lacking on the right and 20 degrees on the left. Conservative management with Achilles stretching attempted. Surgical  intervention considered due to persistent pain and functional limitations. Explained risks of surgery including potential numbness, 1% risk of infection, and 1% risk of incision not healing. Recovery expected to be approximately one month per foot. - Scheduled outpatient surgery for gastrocnemius recession on the right foot. - Instructed on Achilles stretching exercises: balance on the ball of the foot with knees straight, push heels to the floor, perform three times a day for one minute each session.   Bilateral mild hallux valgus (bunion) Bilateral mild hallux valgus with insufficient angles for insurance coverage of surgical intervention. Pain management through conservative measures discussed. - Recommended wearing wider shoes, such as New Balance, to reduce pressure and pain on the bunion.      Maurilio Deland Collet, PA-C 03/15/2024, 9:26 AM       [1]  Allergies Allergen Reactions   Imitrex  [Sumatriptan ] Other (See Comments)    Gave me lockjaw   Tape Other (See Comments)    The plastic-like tape causes redness and irritation   Aspirin Other (See Comments)    Caused chest pain

## 2024-03-16 ENCOUNTER — Encounter (HOSPITAL_COMMUNITY): Payer: Self-pay | Admitting: Orthopedic Surgery

## 2024-03-16 ENCOUNTER — Ambulatory Visit (HOSPITAL_COMMUNITY): Payer: Self-pay | Admitting: Vascular Surgery

## 2024-03-16 ENCOUNTER — Encounter: Admission: RE | Payer: Self-pay

## 2024-03-16 ENCOUNTER — Other Ambulatory Visit (HOSPITAL_COMMUNITY): Payer: Self-pay

## 2024-03-16 ENCOUNTER — Other Ambulatory Visit: Payer: Self-pay

## 2024-03-16 ENCOUNTER — Telehealth: Payer: Self-pay | Admitting: Orthopedic Surgery

## 2024-03-16 ENCOUNTER — Ambulatory Visit (HOSPITAL_COMMUNITY)
Admission: RE | Admit: 2024-03-16 | Discharge: 2024-03-16 | Disposition: A | Discharge status: Home / Self Care | Attending: Orthopedic Surgery | Admitting: Orthopedic Surgery

## 2024-03-16 DIAGNOSIS — K219 Gastro-esophageal reflux disease without esophagitis: Secondary | ICD-10-CM | POA: Insufficient documentation

## 2024-03-16 DIAGNOSIS — M6701 Short Achilles tendon (acquired), right ankle: Secondary | ICD-10-CM | POA: Insufficient documentation

## 2024-03-16 DIAGNOSIS — Z87891 Personal history of nicotine dependence: Secondary | ICD-10-CM

## 2024-03-16 DIAGNOSIS — J45909 Unspecified asthma, uncomplicated: Secondary | ICD-10-CM | POA: Insufficient documentation

## 2024-03-16 DIAGNOSIS — F418 Other specified anxiety disorders: Secondary | ICD-10-CM

## 2024-03-16 DIAGNOSIS — Q798 Other congenital malformations of musculoskeletal system: Secondary | ICD-10-CM

## 2024-03-16 HISTORY — PX: GASTROCNEMIUS RECESSION: SHX863

## 2024-03-16 LAB — CBC WITH DIFFERENTIAL/PLATELET
Abs Immature Granulocytes: 0.07 K/uL (ref 0.00–0.07)
Basophils Absolute: 0.2 K/uL — ABNORMAL HIGH (ref 0.0–0.1)
Basophils Relative: 1 %
Eosinophils Absolute: 0.4 K/uL (ref 0.0–0.5)
Eosinophils Relative: 3 %
HCT: 44.5 % (ref 36.0–46.0)
Hemoglobin: 14.6 g/dL (ref 12.0–15.0)
Immature Granulocytes: 1 %
Lymphocytes Relative: 18 %
Lymphs Abs: 2 K/uL (ref 0.7–4.0)
MCH: 27.8 pg (ref 26.0–34.0)
MCHC: 32.8 g/dL (ref 30.0–36.0)
MCV: 84.8 fL (ref 80.0–100.0)
Monocytes Absolute: 0.8 K/uL (ref 0.1–1.0)
Monocytes Relative: 7 %
Neutro Abs: 7.7 K/uL (ref 1.7–7.7)
Neutrophils Relative %: 70 %
Platelets: 234 K/uL (ref 150–400)
RBC: 5.25 MIL/uL — ABNORMAL HIGH (ref 3.87–5.11)
RDW: 17.1 % — ABNORMAL HIGH (ref 11.5–15.5)
WBC: 11.1 K/uL — ABNORMAL HIGH (ref 4.0–10.5)
nRBC: 0 % (ref 0.0–0.2)

## 2024-03-16 LAB — POCT PREGNANCY, URINE: Preg Test, Ur: NEGATIVE

## 2024-03-16 SURGERY — RECESSION, MUSCLE, GASTROCNEMIUS
Anesthesia: Regional | Laterality: Right

## 2024-03-16 MED ORDER — MIDAZOLAM HCL 2 MG/2ML IJ SOLN
INTRAMUSCULAR | Status: AC
  Administered 2024-03-16: 2 mg via INTRAVENOUS
  Filled 2024-03-16: qty 2

## 2024-03-16 MED ORDER — SODIUM CHLORIDE 0.9 % IR SOLN
Status: DC | PRN
  Administered 2024-03-16: 1000 mL

## 2024-03-16 MED ORDER — ORAL CARE MOUTH RINSE: 15.0000 mL | Freq: Once | OROMUCOSAL | Status: AC

## 2024-03-16 MED ORDER — OXYCODONE HCL 5 MG PO TABS
5.0000 mg | ORAL_TABLET | Freq: Once | ORAL | Status: AC | PRN
  Administered 2024-03-16: 5 mg via ORAL

## 2024-03-16 MED ORDER — FENTANYL CITRATE (PF) 100 MCG/2ML IJ SOLN: 100.0000 ug | Freq: Once | INTRAMUSCULAR | Status: AC

## 2024-03-16 MED ORDER — FENTANYL CITRATE (PF) 100 MCG/2ML IJ SOLN
25.0000 ug | INTRAMUSCULAR | Status: DC | PRN
  Administered 2024-03-16 (×3): 50 ug via INTRAVENOUS

## 2024-03-16 MED ORDER — FENTANYL CITRATE (PF) 100 MCG/2ML IJ SOLN
INTRAMUSCULAR | Status: AC
  Administered 2024-03-16: 100 ug via INTRAVENOUS
  Filled 2024-03-16: qty 2

## 2024-03-16 MED ORDER — FENTANYL CITRATE (PF) 100 MCG/2ML IJ SOLN
INTRAMUSCULAR | Status: AC
  Filled 2024-03-16: qty 2

## 2024-03-16 MED ORDER — PROPOFOL 10 MG/ML IV BOLUS
INTRAVENOUS | Status: DC | PRN
  Administered 2024-03-16: 200 mg via INTRAVENOUS

## 2024-03-16 MED ORDER — CHLORHEXIDINE GLUCONATE 0.12 % MT SOLN
15.0000 mL | Freq: Once | OROMUCOSAL | Status: AC
  Administered 2024-03-16: 15 mL via OROMUCOSAL
  Filled 2024-03-16: qty 15

## 2024-03-16 MED ORDER — DEXMEDETOMIDINE HCL IN NACL 80 MCG/20ML IV SOLN
INTRAVENOUS | Status: AC
  Filled 2024-03-16: qty 20

## 2024-03-16 MED ORDER — OXYCODONE HCL 5 MG/5ML PO SOLN: 5.0000 mg | Freq: Once | ORAL | Status: AC | PRN

## 2024-03-16 MED ORDER — MIDAZOLAM HCL (PF) 2 MG/2ML IJ SOLN: 2.0000 mg | Freq: Once | INTRAMUSCULAR | Status: AC

## 2024-03-16 MED ORDER — LACTATED RINGERS IV SOLN: INTRAVENOUS | Status: DC

## 2024-03-16 MED ORDER — VASHE WOUND IRRIGATION OPTIME
TOPICAL | Status: DC | PRN
  Administered 2024-03-16: 34 [oz_av]

## 2024-03-16 MED ORDER — ROPIVACAINE HCL 5 MG/ML IJ SOLN
INTRAMUSCULAR | Status: DC | PRN
  Administered 2024-03-16: 20 mL via PERINEURAL

## 2024-03-16 MED ORDER — MIDAZOLAM HCL 2 MG/2ML IJ SOLN
INTRAMUSCULAR | Status: AC
  Filled 2024-03-16: qty 2

## 2024-03-16 MED ORDER — ONDANSETRON HCL 4 MG/2ML IJ SOLN: 4.0000 mg | Freq: Once | INTRAMUSCULAR | Status: DC | PRN

## 2024-03-16 MED ORDER — MEPERIDINE HCL 25 MG/ML IJ SOLN: 6.2500 mg | INTRAMUSCULAR | Status: DC | PRN

## 2024-03-16 MED ORDER — OXYCODONE HCL 5 MG PO TABS
ORAL_TABLET | ORAL | Status: AC
  Filled 2024-03-16: qty 1

## 2024-03-16 MED ORDER — ACETAMINOPHEN 500 MG PO TABS
1000.0000 mg | ORAL_TABLET | Freq: Once | ORAL | Status: AC
  Administered 2024-03-16: 1000 mg via ORAL
  Filled 2024-03-16: qty 2

## 2024-03-16 MED ORDER — PHENYLEPHRINE 80 MCG/ML (10ML) SYRINGE FOR IV PUSH (FOR BLOOD PRESSURE SUPPORT)
PREFILLED_SYRINGE | INTRAVENOUS | Status: DC | PRN
  Administered 2024-03-16: 160 ug via INTRAVENOUS

## 2024-03-16 MED ORDER — LIDOCAINE 2% (20 MG/ML) 5 ML SYRINGE
INTRAMUSCULAR | Status: DC | PRN
  Administered 2024-03-16: 100 mg via INTRAVENOUS

## 2024-03-16 MED ORDER — CEFAZOLIN SODIUM-DEXTROSE 2-4 GM/100ML-% IV SOLN
2.0000 g | INTRAVENOUS | Status: AC
  Administered 2024-03-16: 2 g via INTRAVENOUS
  Filled 2024-03-16: qty 100

## 2024-03-16 MED ORDER — ONDANSETRON HCL 4 MG/2ML IJ SOLN
INTRAMUSCULAR | Status: DC | PRN
  Administered 2024-03-16: 4 mg via INTRAVENOUS

## 2024-03-16 MED ORDER — DEXAMETHASONE SOD PHOSPHATE PF 10 MG/ML IJ SOLN
INTRAMUSCULAR | Status: DC | PRN
  Administered 2024-03-16: 4 mg via INTRAVENOUS

## 2024-03-16 MED ORDER — OXYCODONE-ACETAMINOPHEN 5-325 MG PO TABS
1.0000 | ORAL_TABLET | Freq: Four times a day (QID) | ORAL | 0 refills | Status: AC | PRN
  Filled 2024-03-16: qty 30, 8d supply, fill #0

## 2024-03-16 SURGICAL SUPPLY — 23 items
BAG COUNTER SPONGE SURGICOUNT (BAG) ×1 IMPLANT
BNDG COHESIVE 6X5 TAN ST LF (GAUZE/BANDAGES/DRESSINGS) ×1 IMPLANT
BNDG GAUZE DERMACEA FLUFF 4 (GAUZE/BANDAGES/DRESSINGS) IMPLANT
COVER MAYO STAND STRL (DRAPES) ×1 IMPLANT
COVER SURGICAL LIGHT HANDLE (MISCELLANEOUS) ×1 IMPLANT
DRAPE U-SHAPE 47X51 STRL (DRAPES) ×1 IMPLANT
DRSG EMULSION OIL 3X3 NADH (GAUZE/BANDAGES/DRESSINGS) ×1 IMPLANT
DURAPREP 26ML APPLICATOR (WOUND CARE) ×1 IMPLANT
ELECTRODE REM PT RTRN 9FT ADLT (ELECTROSURGICAL) ×1 IMPLANT
GAUZE PAD ABD 8X10 STRL (GAUZE/BANDAGES/DRESSINGS) IMPLANT
GAUZE SPONGE 4X4 12PLY STRL (GAUZE/BANDAGES/DRESSINGS) ×1 IMPLANT
GLOVE BIOGEL PI IND STRL 9 (GLOVE) ×1 IMPLANT
GLOVE BIOGEL PI ORTHO SZ9 (GLOVE) ×1 IMPLANT
GOWN STRL REUS W/ TWL XL LVL3 (GOWN DISPOSABLE) ×2 IMPLANT
KIT BASIN OR (CUSTOM PROCEDURE TRAY) ×1 IMPLANT
KIT TURNOVER KIT B (KITS) ×1 IMPLANT
PACK ORTHO EXTREMITY (CUSTOM PROCEDURE TRAY) ×1 IMPLANT
PAD ARMBOARD POSITIONER FOAM (MISCELLANEOUS) ×2 IMPLANT
PADDING CAST ABS COTTON 4X4 ST (CAST SUPPLIES) ×1 IMPLANT
SOLN STERILE WATER BTL 1000 ML (IV SOLUTION) ×1 IMPLANT
SPONGE T-LAP 18X18 ~~LOC~~+RFID (SPONGE) ×1 IMPLANT
SUT ETHILON 2 0 PSLX (SUTURE) ×1 IMPLANT
UNDERPAD 30X36 HEAVY ABSORB (UNDERPADS AND DIAPERS) ×1 IMPLANT

## 2024-03-16 NOTE — Discharge Instructions (Signed)
 Wear cam boot at all times keep clean and dry.  Elevate your legs above your heart to decrease swelling and pain.  Weight bearing as tolerates use crutches for balance.

## 2024-03-16 NOTE — Progress Notes (Signed)
 Dr. Mallory and Dr. Harden made aware that the lab tech just stated that the patient's CMP sample hemolyzed. Ok to proceed to the OR without recollecting sample per Dr. Harden and Dr. Mallory.

## 2024-03-16 NOTE — Anesthesia Procedure Notes (Signed)
 Anesthesia Regional Block: Adductor canal block   Pre-Anesthetic Checklist: , timeout performed,  Correct Patient, Correct Site, Correct Laterality,  Correct Procedure, Correct Position, site marked,  Risks and benefits discussed,  Surgical consent,  Pre-op evaluation,  At surgeon's request and post-op pain management  Laterality: Right  Prep: chloraprep       Needles:  Injection technique: Single-shot  Needle Type: Echogenic Stimulator Needle     Needle Length: 5cm  Needle Gauge: 22     Additional Needles:   Procedures:, nerve stimulator,,, ultrasound used (permanent image in chart),,     Nerve Stimulator or Paresthesia:  Response: foot, 0.45 mA  Additional Responses:   Narrative:  Start time: 03/16/2024 8:55 AM End time: 03/16/2024 8:57 AM Injection made incrementally with aspirations every 5 mL.  Performed by: Personally  Anesthesiologist: Mallory Manus, MD  Additional Notes: Functioning IV was confirmed and monitors were applied.  A 50mm 22ga Arrow echogenic stimulator needle was used. Sterile prep and drape,hand hygiene and sterile gloves were used. Ultrasound guidance: relevant anatomy identified, needle position confirmed, local anesthetic spread visualized around nerve(s)., vascular puncture avoided.  Image printed for medical record. Negative aspiration and negative test dose prior to incremental administration of local anesthetic. The patient tolerated the procedure well.

## 2024-03-16 NOTE — Interval H&P Note (Signed)
 History and Physical Interval Note:  03/16/2024 8:04 AM  Kimberly Terrell  has presented today for surgery, with the diagnosis of Right Achilles Contracture.  The various methods of treatment have been discussed with the patient and family. After consideration of risks, benefits and other options for treatment, the patient has consented to  Procedures: RECESSION, MUSCLE, GASTROCNEMIUS (Right) as a surgical intervention.  The patient's history has been reviewed, patient examined, no change in status, stable for surgery.  I have reviewed the patient's chart and labs.  Questions were answered to the patient's satisfaction.     Teryl Gubler V Araina Butrick

## 2024-03-16 NOTE — Telephone Encounter (Signed)
 Yes ma'am Monday would be fine. Can you pease call to schedule.

## 2024-03-16 NOTE — Telephone Encounter (Signed)
 Mr. Liebig would like patient seen next Friday. 12/26. I did inform him that providers are not here. [phone]  please call

## 2024-03-16 NOTE — Transfer of Care (Signed)
 Immediate Anesthesia Transfer of Care Note  Patient: Kimberly Terrell  Procedure(s) Performed: RECESSION, MUSCLE, GASTROCNEMIUS (Right)  Patient Location: PACU  Anesthesia Type:General and Regional  Level of Consciousness: drowsy and patient cooperative  Airway & Oxygen Therapy: Patient Spontanous Breathing  Post-op Assessment: Report given to RN, Post -op Vital signs reviewed and stable, and Patient moving all extremities X 4  Post vital signs: Reviewed and stable  Last Vitals:  Vitals Value Taken Time  BP 125/71 03/16/24 10:25  Temp    Pulse 66 03/16/24 10:28  Resp 13 03/16/24 10:28  SpO2 96 % 03/16/24 10:28  Vitals shown include unfiled device data.  Last Pain:  Vitals:   03/16/24 0834  PainSc: 3       Patients Stated Pain Goal: 0 (03/16/24 0811)  Complications: No notable events documented.

## 2024-03-16 NOTE — Telephone Encounter (Signed)
 Pt has an appt on 03/30/2024 at 10:30 am advised per Dr. Harden ok to remove dressing at day 5 and may take a shower and apply a band aid and change this daily. Pt's husband voiced understanding and will call with any questions.

## 2024-03-16 NOTE — Anesthesia Procedure Notes (Signed)
 Procedure Name: LMA Insertion Date/Time: 03/16/2024 9:58 AM  Performed by: Lamar Lucie DASEN, CRNAPre-anesthesia Checklist: Patient identified, Emergency Drugs available, Suction available and Patient being monitored Patient Re-evaluated:Patient Re-evaluated prior to induction Oxygen Delivery Method: Circle system utilized Preoxygenation: Pre-oxygenation with 100% oxygen Induction Type: IV induction Ventilation: Mask ventilation without difficulty LMA: LMA inserted LMA Size: 4.0 Tube type: Oral Number of attempts: 1 Placement Confirmation: positive ETCO2, breath sounds checked- equal and bilateral and CO2 detector Tube secured with: Tape Dental Injury: Teeth and Oropharynx as per pre-operative assessment

## 2024-03-16 NOTE — Telephone Encounter (Signed)
 Patient's husband called. She just had surgery today. Needs 1wk PO visit. Because of the holiday, no one is here next Friday. Would like a call to give instructions on if they should wait until Monday 12/29

## 2024-03-16 NOTE — Op Note (Signed)
 03/16/2024  10:18 AM  PATIENT:  Kimberly Terrell    PRE-OPERATIVE DIAGNOSIS:  Right Achilles Contracture  POST-OPERATIVE DIAGNOSIS:  Same  PROCEDURE:  RECESSION, MUSCLE, GASTROCNEMIUS  SURGEON:  Jerona LULLA Sage, MD  PHYSICIAN ASSISTANT:None ANESTHESIA:   General  PREOPERATIVE INDICATIONS:  Kimberly Terrell is a  48 y.o. female with a diagnosis of Right Achilles Contracture who failed conservative measures and elected for surgical management.    The risks benefits and alternatives were discussed with the patient preoperatively including but not limited to the risks of infection, bleeding, nerve injury, cardiopulmonary complications, the need for revision surgery, among others, and the patient was willing to proceed.  OPERATIVE IMPLANTS:   * No implants in log *  @ENCIMAGES @  OPERATIVE FINDINGS: Patient's dorsiflexion went from 20 degrees short of neutral to 20 degrees past neutral  OPERATIVE PROCEDURE: Patient was brought the operating room after undergoing a regional block the she then underwent a general anesthetic.  After adequate levels anesthesia were obtained patient's right lower extremity was prepped using DuraPrep draped into a sterile field a timeout was called.  A medial longitudinal incision was made 15 cm proximal to the medial malleolus.  Blunt dissection was carried down to the gastrocnemius fascia.  This was released under direct visualization.  This took the dorsiflexion from 20 degrees short of neutral to 20 degrees past neutral.  Electrocautery was used for hemostasis the wound was irrigated with Vashe.  Incision closed using 2-0 nylon sterile dressing was applied patient was extubated taken the PACU in stable condition.   DISCHARGE PLANNING:  Antibiotic duration: Preoperative antibiotics  Weightbearing: Weightbearing as tolerated  Pain medication: Prescription for Percocet  Dressing care/ Wound VAC: Dry dressing  Ambulatory devices: Crutches and cam  boot  Discharge to: Home.  Follow-up: In the office 1 week post operative.

## 2024-03-16 NOTE — Progress Notes (Signed)
 Orthopedic Tech Progress Note Patient Details:  Charlena Haub. Gosser 1975-04-04 [id number]  Ortho Devices Type of Ortho Device: Crutches, CAM walker Ortho Device/Splint Location: RLE Ortho Device/Splint Interventions: Ordered, Application, Adjustment   Post Interventions Patient Tolerated: Well  Jahlon Baines A Kennede Lusk 03/16/2024, 11:24 AM

## 2024-03-18 ENCOUNTER — Other Ambulatory Visit: Payer: Self-pay | Admitting: Student

## 2024-03-19 ENCOUNTER — Encounter (HOSPITAL_COMMUNITY): Payer: Self-pay | Admitting: Orthopedic Surgery

## 2024-03-20 ENCOUNTER — Telehealth: Payer: Self-pay

## 2024-03-20 NOTE — Telephone Encounter (Signed)
 Scheduled f/u 2/2

## 2024-03-20 NOTE — Telephone Encounter (Signed)
 I called and sw the pt's husband per her request. The pt is 1 week s/p a gastroc recession and advised they can remove the bandage and she may shower and apply a band aid over the incision. She should wear her fx boot during the day and will give further instructions at the time of her appt next week. Have replied to multiple my chart messages today and spoke these instructions as documented in the pt's chart with her husband last week. I advised that if they have any other questions to please call and let us  know. The husband said that the pt gets up to urinate 6 times a night and wouldn't not wearing the boot cause her damage to her surgical site. I advised to air on the side of caution and that she can wear 24/7 if that makes her feel more safe and we will address when she comes in the office next week.

## 2024-03-25 NOTE — Anesthesia Postprocedure Evaluation (Signed)
"   Anesthesia Post Note  Patient: Kimberly Terrell. Brightbill  Procedure(s) Performed: RECESSION, MUSCLE, GASTROCNEMIUS (Right)     Patient location during evaluation: PACU Anesthesia Type: Regional and General Level of consciousness: awake and alert Pain management: pain level controlled Vital Signs Assessment: post-procedure vital signs reviewed and stable Respiratory status: spontaneous breathing, nonlabored ventilation, respiratory function stable and patient connected to nasal cannula oxygen Cardiovascular status: blood pressure returned to baseline and stable Postop Assessment: no apparent nausea or vomiting Anesthetic complications: no   No notable events documented.  Last Vitals:  Vitals:   03/16/24 1145 03/16/24 1200  BP: 125/73 131/68  Pulse: 61 60  Resp: 10 19  Temp:  36.6 C  SpO2: 94% 97%    Last Pain:  Vitals:   03/16/24 1045  PainSc: 10-Worst pain ever                 [name]      "

## 2024-03-30 ENCOUNTER — Ambulatory Visit: Admitting: Family

## 2024-03-30 ENCOUNTER — Other Ambulatory Visit: Payer: Self-pay

## 2024-03-30 DIAGNOSIS — L308 Other specified dermatitis: Secondary | ICD-10-CM

## 2024-03-30 DIAGNOSIS — M7741 Metatarsalgia, right foot: Secondary | ICD-10-CM

## 2024-03-30 DIAGNOSIS — M6702 Short Achilles tendon (acquired), left ankle: Secondary | ICD-10-CM

## 2024-03-30 DIAGNOSIS — M6701 Short Achilles tendon (acquired), right ankle: Secondary | ICD-10-CM

## 2024-03-30 NOTE — Progress Notes (Signed)
 "  Post-Op Visit Note   Patient: Kimberly Terrell. Patin           Date of Birth: 1975-07-16           MRN: [id number] Visit Date: 03/30/2024 PCP: Elnora Ip, MD  Chief Complaint:  Chief Complaint  Patient presents with   Right Leg - Routine Post Op    03/17/19 right gastroc recession    HPI:  HPI The patient is a 49 year old woman seen status post right gastrocnemius recession December 19  Has been doing well does continue the cam boot Ortho Exam On examination of the right calf incision is healing well sutures harvested today without incident.  No erythema warmth or dehiscence Visit Diagnoses: No diagnosis found.  Plan: Begin heel cord stretching she will discontinue the cam boot continue dry dressings.  She would like to plan with left gastrocnemius recession as soon as possible  Follow-Up Instructions: No follow-ups on file.   Imaging: No results found.  Orders:  No orders of the defined types were placed in this encounter.  No orders of the defined types were placed in this encounter.    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   Vasomotor symptoms due to menopause 01/16/2024   Amenorrhea 11/07/2023   Hot flashes 11/07/2023   Abdominal pain, right lower quadrant 11/07/2023   Iron deficiency anemia, unspecified 09/09/2023   Productive cough 02/03/2023   Cervical cancer screening 01/28/2023   Right knee pain 01/28/2023   History of iron deficiency 12/24/2022   PVC (premature ventricular contraction) 11/17/2022   Leg cramping 11/17/2022   Colon polyps 08/09/2022   Health care maintenance 04/06/2022   Hemorrhoids 04/06/2022   Insomnia 03/09/2022   Pruritic dermatitis 02/04/2022   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 pylori infection 03/28/2019   Allergic rhinitis 07/25/2017   Depression with anxiety 04/04/2017   Migraine 04/04/2017   DUB (dysfunctional uterine bleeding) 04/04/2017   Past Medical History:  Diagnosis Date   Anemia    Anxiety    Arthritis    Asthma    Bursitis    Right Hip and Right Knee   Depression    Dysrhythmia    H. pylori infection    s/p triple therapy x 2   Migraines     Family History  Problem Relation Age of Onset   Anxiety disorder Mother    Depression Mother    Depression Maternal Aunt    Anxiety disorder Maternal Aunt    Depression Paternal Uncle    Anxiety disorder Paternal Uncle    Depression Daughter    Colon cancer Neg Hx    Esophageal cancer Neg Hx    Gastric cancer Neg Hx    Inflammatory bowel disease Neg Hx    Liver disease Neg Hx    Pancreatic cancer Neg Hx    Rectal cancer Neg Hx    Stomach cancer Neg Hx     Past Surgical History:  Procedure Laterality Date   AUGMENTATION MAMMAPLASTY Bilateral    COSMETIC SURGERY  2008  breast implants    EXCISION MORTON'S NEUROMA Right 02/23/2023   Procedure: RIGHT FOOT EXCISION MASS 2ND WEB SPACE;  Surgeon: Harden Jerona GAILS, MD;  Location: [facility] OR;  Service: Orthopedics;  Laterality: Right;   gastric ulcer surgery     gastric ulcer perforation surgery   GASTROCNEMIUS RECESSION Right 03/16/2024   Procedure: RECESSION, MUSCLE, GASTROCNEMIUS;  Surgeon: Harden Jerona GAILS, MD;  Location: [facility] OR;  Service: Orthopedics;  Laterality: Right;   LAPAROTOMY N/A 12/04/2021   Procedure: EXPLORATORY LAPAROTOMY;  Surgeon: Vanderbilt Ned, MD;  Location: [facility] OR;  Service: General;  Laterality: N/A;   Social History   Occupational History   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, [employer]  Tobacco Use   Smoking  status: Former    Current packs/day: 0.00    Average packs/day: 1.0 packs/day    Types: Cigarettes    Quit date: 12/03/2021    Years since quitting: 2.3   Smokeless tobacco: Never   Tobacco comments:    Trying   Vaping Use   Vaping status: Never Used  Substance and Sexual Activity   Alcohol use: No   Drug use: No   Sexual activity: Yes    Birth control/protection: Surgical    Comment: husband has had a vasectomy    "

## 2024-04-01 MED ORDER — HYDROXYZINE HCL 10 MG PO TABS: 10.0000 mg | ORAL_TABLET | Freq: Three times a day (TID) | ORAL | 1 refills | Status: AC | PRN

## 2024-04-02 DIAGNOSIS — N951 Menopausal and female climacteric states: Secondary | ICD-10-CM

## 2024-04-02 NOTE — Telephone Encounter (Signed)
 Patient last seen 01/16/2024. I called the patient to schedule a appointment. I was unable to reach the patient. I lvm for her to give us  a call back.

## 2024-04-03 ENCOUNTER — Ambulatory Visit

## 2024-04-03 ENCOUNTER — Encounter: Payer: Self-pay | Admitting: Family

## 2024-04-04 ENCOUNTER — Telehealth: Payer: Self-pay | Admitting: *Deleted

## 2024-04-04 NOTE — Telephone Encounter (Signed)
 Mammogram appointment 2/6/206 at 11:50am to arrive 11:30 am -information regarding the 75.00 no show fee attached  to the appointment  / appointment mailed to the patient.

## 2024-04-06 ENCOUNTER — Other Ambulatory Visit: Payer: Self-pay | Admitting: Orthopedic Surgery

## 2024-04-06 DIAGNOSIS — G5761 Lesion of plantar nerve, right lower limb: Secondary | ICD-10-CM

## 2024-04-19 ENCOUNTER — Encounter: Admitting: Internal Medicine

## 2024-04-30 ENCOUNTER — Telehealth: Admitting: Behavioral Health

## 2024-04-30 ENCOUNTER — Encounter: Payer: Self-pay | Admitting: Behavioral Health

## 2024-04-30 DIAGNOSIS — Z6379 Other stressful life events affecting family and household: Secondary | ICD-10-CM

## 2024-04-30 DIAGNOSIS — F411 Generalized anxiety disorder: Secondary | ICD-10-CM

## 2024-04-30 DIAGNOSIS — G4700 Insomnia, unspecified: Secondary | ICD-10-CM

## 2024-04-30 DIAGNOSIS — F39 Unspecified mood [affective] disorder: Secondary | ICD-10-CM

## 2024-04-30 DIAGNOSIS — R454 Irritability and anger: Secondary | ICD-10-CM

## 2024-04-30 DIAGNOSIS — Z638 Other specified problems related to primary support group: Secondary | ICD-10-CM

## 2024-04-30 DIAGNOSIS — F331 Major depressive disorder, recurrent, moderate: Secondary | ICD-10-CM

## 2024-05-02 ENCOUNTER — Telehealth: Payer: Self-pay | Admitting: Student

## 2024-05-02 NOTE — Telephone Encounter (Signed)
-----   Message from Mliss Foot, MD sent at 04/18/2024 10:26 AM EST ----- I would like to recheck her iron levels before giving the next dose.  You can discontinue the treatment plan.  [facility] team - would you please call patient to schedule an appointment in the next month w any resident? She cancelled her 12/5 appointment and has not rescheduled. Reason for visit is iron check . Thank you ----- Message ----- From: Jama Schuyler RAMAN, CPhT Sent: 04/16/2024   9:05 AM EST To: Mliss Foot, MD  Good morning,  We received a referral for IV iron for this patient on October 20th. The patient received one dose of iron on November 4th and canceled her appointment for her remaining dose. Would you like the patient to receive the last remaining dose or can the treatment plan be discontinued?  Thanks!

## 2024-05-02 NOTE — Telephone Encounter (Signed)
 Patient sent a message via my chart to contact our office to schedule a future appointment.

## 2024-05-04 ENCOUNTER — Ambulatory Visit

## 2024-06-29 ENCOUNTER — Ambulatory Visit (HOSPITAL_COMMUNITY): Admit: 2024-06-29 | Admitting: Orthopedic Surgery

## 2024-07-03 ENCOUNTER — Ambulatory Visit
